# Patient Record
Sex: Male | Born: 1945 | Race: White | Hispanic: No | Marital: Married | Smoking: Former smoker
Health system: Southern US, Community
[De-identification: ages and names within clinical notes are randomized; demographics above are authoritative.]

## PROBLEM LIST (undated history)

## (undated) DIAGNOSIS — Z87442 Personal history of urinary calculi: Secondary | ICD-10-CM

## (undated) DIAGNOSIS — K219 Gastro-esophageal reflux disease without esophagitis: Secondary | ICD-10-CM

## (undated) DIAGNOSIS — B999 Unspecified infectious disease: Secondary | ICD-10-CM

## (undated) DIAGNOSIS — I10 Essential (primary) hypertension: Secondary | ICD-10-CM

## (undated) DIAGNOSIS — Z8719 Personal history of other diseases of the digestive system: Secondary | ICD-10-CM

## (undated) DIAGNOSIS — E039 Hypothyroidism, unspecified: Secondary | ICD-10-CM

## (undated) DIAGNOSIS — T7840XA Allergy, unspecified, initial encounter: Secondary | ICD-10-CM

## (undated) DIAGNOSIS — G5791 Unspecified mononeuropathy of right lower limb: Secondary | ICD-10-CM

## (undated) DIAGNOSIS — I714 Abdominal aortic aneurysm, without rupture, unspecified: Secondary | ICD-10-CM

## (undated) DIAGNOSIS — T8859XA Other complications of anesthesia, initial encounter: Secondary | ICD-10-CM

## (undated) DIAGNOSIS — I219 Acute myocardial infarction, unspecified: Secondary | ICD-10-CM

## (undated) DIAGNOSIS — I509 Heart failure, unspecified: Secondary | ICD-10-CM

## (undated) DIAGNOSIS — B957 Other staphylococcus as the cause of diseases classified elsewhere: Secondary | ICD-10-CM

## (undated) DIAGNOSIS — H269 Unspecified cataract: Secondary | ICD-10-CM

## (undated) DIAGNOSIS — K449 Diaphragmatic hernia without obstruction or gangrene: Secondary | ICD-10-CM

## (undated) DIAGNOSIS — K649 Unspecified hemorrhoids: Secondary | ICD-10-CM

## (undated) DIAGNOSIS — Z5189 Encounter for other specified aftercare: Secondary | ICD-10-CM

## (undated) DIAGNOSIS — N189 Chronic kidney disease, unspecified: Secondary | ICD-10-CM

## (undated) DIAGNOSIS — M869 Osteomyelitis, unspecified: Secondary | ICD-10-CM

## (undated) DIAGNOSIS — F329 Major depressive disorder, single episode, unspecified: Secondary | ICD-10-CM

## (undated) DIAGNOSIS — Z9189 Other specified personal risk factors, not elsewhere classified: Secondary | ICD-10-CM

## (undated) DIAGNOSIS — R7303 Prediabetes: Secondary | ICD-10-CM

## (undated) DIAGNOSIS — R609 Edema, unspecified: Secondary | ICD-10-CM

## (undated) DIAGNOSIS — R7302 Impaired glucose tolerance (oral): Secondary | ICD-10-CM

## (undated) DIAGNOSIS — J189 Pneumonia, unspecified organism: Secondary | ICD-10-CM

## (undated) DIAGNOSIS — E785 Hyperlipidemia, unspecified: Secondary | ICD-10-CM

## (undated) DIAGNOSIS — R42 Dizziness and giddiness: Secondary | ICD-10-CM

## (undated) DIAGNOSIS — L03031 Cellulitis of right toe: Secondary | ICD-10-CM

## (undated) DIAGNOSIS — T8459XA Infection and inflammatory reaction due to other internal joint prosthesis, initial encounter: Secondary | ICD-10-CM

## (undated) DIAGNOSIS — I959 Hypotension, unspecified: Secondary | ICD-10-CM

## (undated) DIAGNOSIS — T4145XA Adverse effect of unspecified anesthetic, initial encounter: Secondary | ICD-10-CM

## (undated) DIAGNOSIS — K5730 Diverticulosis of large intestine without perforation or abscess without bleeding: Secondary | ICD-10-CM

## (undated) DIAGNOSIS — E119 Type 2 diabetes mellitus without complications: Secondary | ICD-10-CM

## (undated) DIAGNOSIS — N529 Male erectile dysfunction, unspecified: Secondary | ICD-10-CM

## (undated) DIAGNOSIS — G608 Other hereditary and idiopathic neuropathies: Secondary | ICD-10-CM

## (undated) DIAGNOSIS — Z96659 Presence of unspecified artificial knee joint: Secondary | ICD-10-CM

## (undated) DIAGNOSIS — L309 Dermatitis, unspecified: Secondary | ICD-10-CM

## (undated) DIAGNOSIS — I2510 Atherosclerotic heart disease of native coronary artery without angina pectoris: Secondary | ICD-10-CM

## (undated) DIAGNOSIS — F411 Generalized anxiety disorder: Secondary | ICD-10-CM

## (undated) DIAGNOSIS — M1990 Unspecified osteoarthritis, unspecified site: Secondary | ICD-10-CM

## (undated) DIAGNOSIS — L304 Erythema intertrigo: Secondary | ICD-10-CM

## (undated) HISTORY — DX: Chronic kidney disease, unspecified: N18.9

## (undated) HISTORY — PX: HIP SURGERY: SHX245

## (undated) HISTORY — DX: Edema, unspecified: R60.9

## (undated) HISTORY — DX: Diverticulosis of large intestine without perforation or abscess without bleeding: K57.30

## (undated) HISTORY — DX: Encounter for other specified aftercare: Z51.89

## (undated) HISTORY — DX: Diaphragmatic hernia without obstruction or gangrene: K44.9

## (undated) HISTORY — DX: Other staphylococcus as the cause of diseases classified elsewhere: B95.7

## (undated) HISTORY — PX: LEG SURGERY: SHX1003

## (undated) HISTORY — DX: Generalized anxiety disorder: F41.1

## (undated) HISTORY — PX: UPPER GASTROINTESTINAL ENDOSCOPY: SHX188

## (undated) HISTORY — DX: Unspecified cataract: H26.9

## (undated) HISTORY — DX: Gastro-esophageal reflux disease without esophagitis: K21.9

## (undated) HISTORY — PX: NISSEN FUNDOPLICATION: SHX2091

## (undated) HISTORY — DX: Other hereditary and idiopathic neuropathies: G60.8

## (undated) HISTORY — DX: Unspecified hemorrhoids: K64.9

## (undated) HISTORY — DX: Hypothyroidism, unspecified: E03.9

## (undated) HISTORY — DX: Pneumonia, unspecified organism: J18.9

## (undated) HISTORY — DX: Unspecified mononeuropathy of right lower limb: G57.91

## (undated) HISTORY — DX: Unspecified osteoarthritis, unspecified site: M19.90

## (undated) HISTORY — DX: Presence of unspecified artificial knee joint: Z96.659

## (undated) HISTORY — DX: Personal history of other diseases of the digestive system: Z87.19

## (undated) HISTORY — DX: Acute myocardial infarction, unspecified: I21.9

## (undated) HISTORY — DX: Other specified personal risk factors, not elsewhere classified: Z91.89

## (undated) HISTORY — DX: Erythema intertrigo: L30.4

## (undated) HISTORY — DX: Cellulitis of right toe: L03.031

## (undated) HISTORY — DX: Allergy, unspecified, initial encounter: T78.40XA

## (undated) HISTORY — DX: Dizziness and giddiness: R42

## (undated) HISTORY — DX: Hypotension, unspecified: I95.9

## (undated) HISTORY — DX: Heart failure, unspecified: I50.9

## (undated) HISTORY — DX: Impaired glucose tolerance (oral): R73.02

## (undated) HISTORY — DX: Major depressive disorder, single episode, unspecified: F32.9

## (undated) HISTORY — DX: Essential (primary) hypertension: I10

## (undated) HISTORY — DX: Dermatitis, unspecified: L30.9

## (undated) HISTORY — DX: Infection and inflammatory reaction due to other internal joint prosthesis, initial encounter: T84.59XA

## (undated) HISTORY — DX: Atherosclerotic heart disease of native coronary artery without angina pectoris: I25.10

## (undated) HISTORY — PX: FOOT SURGERY: SHX648

## (undated) HISTORY — DX: Osteomyelitis, unspecified: M86.9

## (undated) HISTORY — DX: Male erectile dysfunction, unspecified: N52.9

## (undated) HISTORY — PX: OTHER SURGICAL HISTORY: SHX169

## (undated) HISTORY — PX: COLONOSCOPY: SHX174

## (undated) HISTORY — DX: Hyperlipidemia, unspecified: E78.5

---

## 1998-08-26 ENCOUNTER — Ambulatory Visit (HOSPITAL_COMMUNITY): Admission: RE | Admit: 1998-08-26 | Discharge: 1998-08-26 | Payer: Self-pay | Admitting: Gastroenterology

## 1999-09-26 ENCOUNTER — Inpatient Hospital Stay (HOSPITAL_COMMUNITY): Admission: EM | Admit: 1999-09-26 | Discharge: 1999-10-01 | Payer: Self-pay | Admitting: Emergency Medicine

## 1999-09-26 ENCOUNTER — Encounter: Payer: Self-pay | Admitting: Emergency Medicine

## 1999-09-27 ENCOUNTER — Encounter: Payer: Self-pay | Admitting: Surgery

## 1999-09-27 ENCOUNTER — Encounter: Payer: Self-pay | Admitting: General Surgery

## 1999-09-30 ENCOUNTER — Encounter: Payer: Self-pay | Admitting: General Surgery

## 2000-07-06 ENCOUNTER — Encounter: Payer: Self-pay | Admitting: Family Medicine

## 2000-07-06 ENCOUNTER — Ambulatory Visit (HOSPITAL_COMMUNITY): Admission: RE | Admit: 2000-07-06 | Discharge: 2000-07-06 | Payer: Self-pay | Admitting: Family Medicine

## 2000-10-31 ENCOUNTER — Other Ambulatory Visit: Admission: RE | Admit: 2000-10-31 | Discharge: 2000-10-31 | Payer: Self-pay | Admitting: Gastroenterology

## 2000-10-31 ENCOUNTER — Encounter (INDEPENDENT_AMBULATORY_CARE_PROVIDER_SITE_OTHER): Payer: Self-pay | Admitting: Specialist

## 2002-03-27 ENCOUNTER — Emergency Department (HOSPITAL_COMMUNITY): Admission: EM | Admit: 2002-03-27 | Discharge: 2002-03-27 | Payer: Self-pay | Admitting: Emergency Medicine

## 2002-06-19 ENCOUNTER — Encounter: Payer: Self-pay | Admitting: Orthopedic Surgery

## 2002-06-19 ENCOUNTER — Inpatient Hospital Stay (HOSPITAL_COMMUNITY): Admission: EM | Admit: 2002-06-19 | Discharge: 2002-06-26 | Payer: Self-pay | Admitting: Emergency Medicine

## 2002-06-19 ENCOUNTER — Encounter: Payer: Self-pay | Admitting: Emergency Medicine

## 2002-06-24 ENCOUNTER — Encounter: Payer: Self-pay | Admitting: Orthopedic Surgery

## 2004-08-31 ENCOUNTER — Ambulatory Visit: Payer: Self-pay | Admitting: Internal Medicine

## 2004-09-16 ENCOUNTER — Ambulatory Visit: Payer: Self-pay

## 2005-03-28 DIAGNOSIS — I219 Acute myocardial infarction, unspecified: Secondary | ICD-10-CM

## 2005-03-28 HISTORY — PX: JOINT REPLACEMENT: SHX530

## 2005-03-28 HISTORY — DX: Acute myocardial infarction, unspecified: I21.9

## 2005-04-27 ENCOUNTER — Ambulatory Visit: Payer: Self-pay | Admitting: Internal Medicine

## 2005-10-03 ENCOUNTER — Emergency Department (HOSPITAL_COMMUNITY): Admission: EM | Admit: 2005-10-03 | Discharge: 2005-10-03 | Payer: Self-pay | Admitting: Emergency Medicine

## 2005-10-05 ENCOUNTER — Observation Stay (HOSPITAL_COMMUNITY): Admission: RE | Admit: 2005-10-05 | Discharge: 2005-10-06 | Payer: Self-pay | Admitting: Orthopedic Surgery

## 2005-10-20 ENCOUNTER — Ambulatory Visit: Payer: Self-pay | Admitting: Internal Medicine

## 2005-10-20 LAB — CONVERTED CEMR LAB: PSA: 0.33 ng/mL

## 2005-10-25 ENCOUNTER — Ambulatory Visit (HOSPITAL_COMMUNITY): Admission: RE | Admit: 2005-10-25 | Discharge: 2005-10-25 | Payer: Self-pay | Admitting: Internal Medicine

## 2005-11-11 ENCOUNTER — Ambulatory Visit: Payer: Self-pay | Admitting: Gastroenterology

## 2005-12-19 ENCOUNTER — Ambulatory Visit: Payer: Self-pay | Admitting: Gastroenterology

## 2005-12-19 ENCOUNTER — Encounter: Payer: Self-pay | Admitting: Gastroenterology

## 2005-12-19 LAB — HM COLONOSCOPY

## 2006-01-23 ENCOUNTER — Ambulatory Visit: Payer: Self-pay | Admitting: Internal Medicine

## 2006-02-25 HISTORY — PX: CORONARY ARTERY BYPASS GRAFT: SHX141

## 2006-03-15 ENCOUNTER — Ambulatory Visit: Payer: Self-pay | Admitting: Cardiology

## 2006-03-15 ENCOUNTER — Inpatient Hospital Stay (HOSPITAL_COMMUNITY)
Admission: RE | Admit: 2006-03-15 | Discharge: 2006-03-31 | Payer: Self-pay | Admitting: Thoracic Surgery (Cardiothoracic Vascular Surgery)

## 2006-03-20 ENCOUNTER — Ambulatory Visit: Payer: Self-pay | Admitting: Physical Medicine & Rehabilitation

## 2006-03-31 ENCOUNTER — Inpatient Hospital Stay (HOSPITAL_COMMUNITY)
Admission: RE | Admit: 2006-03-31 | Discharge: 2006-04-12 | Payer: Self-pay | Admitting: Physical Medicine & Rehabilitation

## 2006-04-07 ENCOUNTER — Encounter: Payer: Self-pay | Admitting: Vascular Surgery

## 2006-05-04 ENCOUNTER — Ambulatory Visit: Payer: Self-pay | Admitting: Cardiothoracic Surgery

## 2006-05-05 ENCOUNTER — Ambulatory Visit: Payer: Self-pay | Admitting: Cardiology

## 2006-05-05 ENCOUNTER — Ambulatory Visit: Payer: Self-pay | Admitting: Internal Medicine

## 2006-05-11 ENCOUNTER — Ambulatory Visit: Payer: Self-pay

## 2006-05-11 ENCOUNTER — Encounter: Payer: Self-pay | Admitting: Cardiology

## 2006-05-24 ENCOUNTER — Encounter: Admission: RE | Admit: 2006-05-24 | Discharge: 2006-06-28 | Payer: Self-pay | Admitting: Orthopedic Surgery

## 2006-05-26 ENCOUNTER — Ambulatory Visit: Payer: Self-pay | Admitting: Cardiology

## 2006-06-02 ENCOUNTER — Ambulatory Visit: Payer: Self-pay | Admitting: Internal Medicine

## 2006-06-02 LAB — CONVERTED CEMR LAB
Albumin: 3.8 g/dL (ref 3.5–5.2)
BUN: 11 mg/dL (ref 6–23)
Calcium: 9.6 mg/dL (ref 8.4–10.5)
Chloride: 100 meq/L (ref 96–112)
Cholesterol: 124 mg/dL (ref 0–200)
GFR calc Af Amer: 87 mL/min
GFR calc non Af Amer: 72 mL/min
Glucose, Bld: 103 mg/dL — ABNORMAL HIGH (ref 70–99)
HDL: 28.1 mg/dL — ABNORMAL LOW (ref >39.0)
Sodium: 136 meq/L (ref 135–145)
Total CHOL/HDL Ratio: 4.4
VLDL: 26 mg/dL (ref 0–40)

## 2006-06-06 ENCOUNTER — Ambulatory Visit: Payer: Self-pay | Admitting: Thoracic Surgery (Cardiothoracic Vascular Surgery)

## 2006-06-29 ENCOUNTER — Encounter: Admission: RE | Admit: 2006-06-29 | Discharge: 2006-07-24 | Payer: Self-pay | Admitting: Orthopedic Surgery

## 2006-08-31 ENCOUNTER — Ambulatory Visit: Payer: Self-pay | Admitting: Cardiology

## 2006-11-09 ENCOUNTER — Encounter: Payer: Self-pay | Admitting: Internal Medicine

## 2006-11-09 DIAGNOSIS — K219 Gastro-esophageal reflux disease without esophagitis: Secondary | ICD-10-CM

## 2006-11-09 DIAGNOSIS — Z9189 Other specified personal risk factors, not elsewhere classified: Secondary | ICD-10-CM | POA: Insufficient documentation

## 2006-11-09 DIAGNOSIS — I10 Essential (primary) hypertension: Secondary | ICD-10-CM | POA: Insufficient documentation

## 2006-11-09 DIAGNOSIS — E785 Hyperlipidemia, unspecified: Secondary | ICD-10-CM | POA: Insufficient documentation

## 2006-11-09 DIAGNOSIS — K5730 Diverticulosis of large intestine without perforation or abscess without bleeding: Secondary | ICD-10-CM | POA: Insufficient documentation

## 2006-11-09 DIAGNOSIS — Z8719 Personal history of other diseases of the digestive system: Secondary | ICD-10-CM

## 2006-11-09 DIAGNOSIS — I2510 Atherosclerotic heart disease of native coronary artery without angina pectoris: Secondary | ICD-10-CM

## 2006-11-09 DIAGNOSIS — K449 Diaphragmatic hernia without obstruction or gangrene: Secondary | ICD-10-CM | POA: Insufficient documentation

## 2006-11-09 DIAGNOSIS — J309 Allergic rhinitis, unspecified: Secondary | ICD-10-CM | POA: Insufficient documentation

## 2006-11-09 HISTORY — DX: Personal history of other diseases of the digestive system: Z87.19

## 2006-11-09 HISTORY — DX: Gastro-esophageal reflux disease without esophagitis: K21.9

## 2006-11-09 HISTORY — DX: Diaphragmatic hernia without obstruction or gangrene: K44.9

## 2006-11-09 HISTORY — DX: Essential (primary) hypertension: I10

## 2006-11-09 HISTORY — DX: Atherosclerotic heart disease of native coronary artery without angina pectoris: I25.10

## 2006-11-09 HISTORY — DX: Diverticulosis of large intestine without perforation or abscess without bleeding: K57.30

## 2006-11-09 HISTORY — DX: Hyperlipidemia, unspecified: E78.5

## 2006-11-09 HISTORY — DX: Other specified personal risk factors, not elsewhere classified: Z91.89

## 2006-11-11 DIAGNOSIS — G629 Polyneuropathy, unspecified: Secondary | ICD-10-CM | POA: Insufficient documentation

## 2006-11-11 DIAGNOSIS — I5032 Chronic diastolic (congestive) heart failure: Secondary | ICD-10-CM | POA: Insufficient documentation

## 2006-11-11 DIAGNOSIS — F411 Generalized anxiety disorder: Secondary | ICD-10-CM

## 2006-11-11 DIAGNOSIS — I509 Heart failure, unspecified: Secondary | ICD-10-CM

## 2006-11-11 DIAGNOSIS — G608 Other hereditary and idiopathic neuropathies: Secondary | ICD-10-CM

## 2006-11-11 DIAGNOSIS — Z8669 Personal history of other diseases of the nervous system and sense organs: Secondary | ICD-10-CM | POA: Insufficient documentation

## 2006-11-11 DIAGNOSIS — I5030 Unspecified diastolic (congestive) heart failure: Secondary | ICD-10-CM | POA: Insufficient documentation

## 2006-11-11 HISTORY — DX: Other hereditary and idiopathic neuropathies: G60.8

## 2006-11-11 HISTORY — DX: Generalized anxiety disorder: F41.1

## 2006-11-11 HISTORY — DX: Heart failure, unspecified: I50.9

## 2006-12-12 ENCOUNTER — Ambulatory Visit: Payer: Self-pay | Admitting: Internal Medicine

## 2006-12-12 LAB — CONVERTED CEMR LAB
ALT: 24 units/L (ref 0–53)
AST: 23 units/L (ref 0–37)
Albumin: 3.9 g/dL (ref 3.5–5.2)
Alkaline Phosphatase: 116 units/L (ref 39–117)
BUN: 15 mg/dL (ref 6–23)
Basophils Absolute: 0 10*3/uL (ref 0.0–0.1)
Basophils Relative: 0.2 % (ref 0.0–1.0)
Calcium: 9.2 mg/dL (ref 8.4–10.5)
Creatinine,U: 38.9 mg/dL
Eosinophils Absolute: 0.4 10*3/uL (ref 0.0–0.6)
Eosinophils Relative: 5.6 % — ABNORMAL HIGH (ref 0.0–5.0)
HCT: 44.9 % (ref 39.0–52.0)
Hemoglobin: 15.5 g/dL (ref 13.0–17.0)
Hgb A1c MFr Bld: 5.6 % (ref 4.6–6.0)
Microalb Creat Ratio: 7.7 mg/g (ref 0.0–30.0)
Monocytes Absolute: 0.6 10*3/uL (ref 0.2–0.7)
Monocytes Relative: 7.8 % (ref 3.0–11.0)
Platelets: 190 10*3/uL (ref 150–400)
RBC: 4.79 M/uL (ref 4.22–5.81)
Sodium: 141 meq/L (ref 135–145)
TSH: 6.15 microintl units/mL — ABNORMAL HIGH (ref 0.35–5.50)
Total Protein: 7.3 g/dL (ref 6.0–8.3)
WBC: 7.9 10*3/uL (ref 4.5–10.5)

## 2006-12-15 ENCOUNTER — Ambulatory Visit: Payer: Self-pay

## 2007-01-05 ENCOUNTER — Ambulatory Visit: Payer: Self-pay | Admitting: Internal Medicine

## 2007-01-07 ENCOUNTER — Encounter: Payer: Self-pay | Admitting: Internal Medicine

## 2007-01-07 DIAGNOSIS — I714 Abdominal aortic aneurysm, without rupture, unspecified: Secondary | ICD-10-CM | POA: Insufficient documentation

## 2007-01-07 DIAGNOSIS — J069 Acute upper respiratory infection, unspecified: Secondary | ICD-10-CM | POA: Insufficient documentation

## 2007-03-01 ENCOUNTER — Ambulatory Visit: Payer: Self-pay | Admitting: Cardiology

## 2007-03-01 LAB — CONVERTED CEMR LAB
Albumin: 3.8 g/dL (ref 3.5–5.2)
Alkaline Phosphatase: 97 units/L (ref 39–117)
Bilirubin, Direct: 0.2 mg/dL (ref 0.0–0.3)
CO2: 31 meq/L (ref 19–32)
Calcium: 9.5 mg/dL (ref 8.4–10.5)
Direct LDL: 80.7 mg/dL
GFR calc Af Amer: 79 mL/min
GFR calc non Af Amer: 65 mL/min
Glucose, Bld: 99 mg/dL (ref 70–99)
HDL: 28.9 mg/dL — ABNORMAL LOW (ref >39.0)
Potassium: 4.3 meq/L (ref 3.5–5.1)
Sodium: 139 meq/L (ref 135–145)

## 2007-03-09 ENCOUNTER — Ambulatory Visit: Payer: Self-pay | Admitting: Internal Medicine

## 2007-03-15 ENCOUNTER — Ambulatory Visit: Payer: Self-pay | Admitting: Cardiology

## 2007-05-03 ENCOUNTER — Ambulatory Visit: Payer: Self-pay | Admitting: Cardiology

## 2007-05-03 LAB — CONVERTED CEMR LAB
ALT: 27 units/L (ref 0–53)
Bilirubin, Direct: 0.2 mg/dL (ref 0.0–0.3)
Cholesterol: 149 mg/dL (ref 0–200)
Direct LDL: 85.5 mg/dL
VLDL: 43 mg/dL — ABNORMAL HIGH (ref 0–40)

## 2007-05-10 ENCOUNTER — Ambulatory Visit: Payer: Self-pay | Admitting: Cardiology

## 2007-06-12 ENCOUNTER — Ambulatory Visit: Payer: Self-pay | Admitting: Cardiology

## 2007-06-12 LAB — CONVERTED CEMR LAB
Calcium: 9.3 mg/dL (ref 8.4–10.5)
Creatinine, Ser: 1.3 mg/dL (ref 0.4–1.5)
GFR calc Af Amer: 72 mL/min
GFR calc non Af Amer: 59 mL/min
Glucose, Bld: 96 mg/dL (ref 70–99)
Potassium: 4.2 meq/L (ref 3.5–5.1)
Sodium: 141 meq/L (ref 135–145)

## 2007-06-26 ENCOUNTER — Ambulatory Visit: Payer: Self-pay | Admitting: Cardiology

## 2007-06-26 LAB — CONVERTED CEMR LAB
BUN: 13 mg/dL (ref 6–23)
Calcium: 9.2 mg/dL (ref 8.4–10.5)
Chloride: 105 meq/L (ref 96–112)
Creatinine, Ser: 1.2 mg/dL (ref 0.4–1.5)
GFR calc non Af Amer: 65 mL/min
Potassium: 4.1 meq/L (ref 3.5–5.1)

## 2007-07-05 ENCOUNTER — Ambulatory Visit: Payer: Self-pay | Admitting: Internal Medicine

## 2007-07-05 LAB — CONVERTED CEMR LAB
ALT: 26 units/L (ref 0–53)
Albumin: 3.8 g/dL (ref 3.5–5.2)
Alkaline Phosphatase: 86 units/L (ref 39–117)
Cholesterol: 131 mg/dL (ref 0–200)
Direct LDL: 73.1 mg/dL
Total Bilirubin: 0.9 mg/dL (ref 0.3–1.2)

## 2007-07-26 ENCOUNTER — Ambulatory Visit: Payer: Self-pay | Admitting: Internal Medicine

## 2007-07-26 DIAGNOSIS — M25579 Pain in unspecified ankle and joints of unspecified foot: Secondary | ICD-10-CM | POA: Insufficient documentation

## 2007-09-03 ENCOUNTER — Ambulatory Visit: Payer: Self-pay | Admitting: Cardiology

## 2007-09-03 LAB — CONVERTED CEMR LAB
ALT: 21 units/L (ref 0–53)
AST: 22 units/L (ref 0–37)
Bilirubin, Direct: 0.1 mg/dL (ref 0.0–0.3)
Cholesterol: 134 mg/dL (ref 0–200)
Total Bilirubin: 1.1 mg/dL (ref 0.3–1.2)
Total CHOL/HDL Ratio: 3.8
Total Protein: 7.2 g/dL (ref 6.0–8.3)
Triglycerides: 149 mg/dL (ref 0–149)

## 2007-09-06 ENCOUNTER — Ambulatory Visit: Payer: Self-pay | Admitting: Cardiology

## 2008-01-16 ENCOUNTER — Ambulatory Visit: Payer: Self-pay | Admitting: Internal Medicine

## 2008-01-16 ENCOUNTER — Ambulatory Visit: Payer: Self-pay | Admitting: Gastroenterology

## 2008-01-16 LAB — CONVERTED CEMR LAB
Albumin: 3.9 g/dL (ref 3.5–5.2)
BUN: 19 mg/dL (ref 6–23)
Bilirubin Urine: NEGATIVE
Bilirubin, Direct: 0.3 mg/dL (ref 0.0–0.3)
CO2: 27 meq/L (ref 19–32)
Chloride: 105 meq/L (ref 96–112)
Eosinophils Relative: 2.2 % (ref 0.0–5.0)
Glucose, Bld: 100 mg/dL — ABNORMAL HIGH (ref 70–99)
HCT: 43.4 % (ref 39.0–52.0)
Ketones, ur: NEGATIVE mg/dL
Leukocytes, UA: NEGATIVE
MCHC: 35 g/dL (ref 30.0–36.0)
MCV: 95.7 fL (ref 78.0–100.0)
Monocytes Absolute: 0.4 10*3/uL (ref 0.1–1.0)
Nitrite: NEGATIVE
PSA: 0.34 ng/mL (ref 0.10–4.00)
RBC: 4.53 M/uL (ref 4.22–5.81)
Sodium: 141 meq/L (ref 135–145)
TSH: 3.41 microintl units/mL (ref 0.35–5.50)
Total Protein: 6.9 g/dL (ref 6.0–8.3)
Triglycerides: 130 mg/dL (ref 0–149)
Urine Glucose: NEGATIVE mg/dL
Urobilinogen, UA: 0.2 (ref 0.0–1.0)
VLDL: 26 mg/dL (ref 0–40)
WBC: 8.2 10*3/uL (ref 4.5–10.5)
pH: 6 (ref 5.0–8.0)

## 2008-01-25 ENCOUNTER — Ambulatory Visit: Payer: Self-pay | Admitting: Internal Medicine

## 2008-01-25 DIAGNOSIS — R0602 Shortness of breath: Secondary | ICD-10-CM | POA: Insufficient documentation

## 2008-01-29 ENCOUNTER — Telehealth (INDEPENDENT_AMBULATORY_CARE_PROVIDER_SITE_OTHER): Payer: Self-pay | Admitting: *Deleted

## 2008-01-30 ENCOUNTER — Ambulatory Visit: Payer: Self-pay | Admitting: Gastroenterology

## 2008-01-30 ENCOUNTER — Encounter: Payer: Self-pay | Admitting: Gastroenterology

## 2008-01-30 DIAGNOSIS — K2970 Gastritis, unspecified, without bleeding: Secondary | ICD-10-CM | POA: Insufficient documentation

## 2008-01-30 DIAGNOSIS — K2990 Gastroduodenitis, unspecified, without bleeding: Secondary | ICD-10-CM

## 2008-01-30 LAB — CONVERTED CEMR LAB: UREASE: NEGATIVE

## 2008-02-01 ENCOUNTER — Encounter: Payer: Self-pay | Admitting: Gastroenterology

## 2008-03-03 ENCOUNTER — Ambulatory Visit: Payer: Self-pay | Admitting: Internal Medicine

## 2008-03-20 ENCOUNTER — Telehealth (INDEPENDENT_AMBULATORY_CARE_PROVIDER_SITE_OTHER): Payer: Self-pay | Admitting: *Deleted

## 2008-06-18 ENCOUNTER — Ambulatory Visit: Payer: Self-pay | Admitting: Cardiology

## 2008-06-18 LAB — CONVERTED CEMR LAB
Albumin: 3.6 g/dL (ref 3.5–5.2)
Bilirubin, Direct: 0.2 mg/dL (ref 0.0–0.3)
Cholesterol: 118 mg/dL (ref 0–200)
HDL: 28.3 mg/dL — ABNORMAL LOW (ref >39.00)
LDL Cholesterol: 64 mg/dL (ref 0–99)
Total Bilirubin: 1.3 mg/dL — ABNORMAL HIGH (ref 0.3–1.2)
Total Protein: 6.9 g/dL (ref 6.0–8.3)
VLDL: 25.4 mg/dL (ref 0.0–40.0)

## 2008-07-18 ENCOUNTER — Ambulatory Visit: Payer: Self-pay | Admitting: Internal Medicine

## 2008-07-18 DIAGNOSIS — R350 Frequency of micturition: Secondary | ICD-10-CM | POA: Insufficient documentation

## 2008-07-18 DIAGNOSIS — M79609 Pain in unspecified limb: Secondary | ICD-10-CM | POA: Insufficient documentation

## 2008-07-19 LAB — CONVERTED CEMR LAB
Nitrite: NEGATIVE
Total Protein, Urine: NEGATIVE mg/dL
Urine Glucose: NEGATIVE mg/dL
pH: 5.5 (ref 5.0–8.0)

## 2008-07-23 ENCOUNTER — Ambulatory Visit: Payer: Self-pay

## 2008-07-23 ENCOUNTER — Encounter: Payer: Self-pay | Admitting: Internal Medicine

## 2008-08-27 DIAGNOSIS — I219 Acute myocardial infarction, unspecified: Secondary | ICD-10-CM | POA: Insufficient documentation

## 2008-08-27 DIAGNOSIS — I719 Aortic aneurysm of unspecified site, without rupture: Secondary | ICD-10-CM | POA: Insufficient documentation

## 2008-08-27 DIAGNOSIS — M1990 Unspecified osteoarthritis, unspecified site: Secondary | ICD-10-CM

## 2008-08-27 HISTORY — DX: Unspecified osteoarthritis, unspecified site: M19.90

## 2008-08-28 ENCOUNTER — Ambulatory Visit: Payer: Self-pay | Admitting: Cardiology

## 2008-09-04 ENCOUNTER — Telehealth: Payer: Self-pay | Admitting: Internal Medicine

## 2008-11-13 ENCOUNTER — Ambulatory Visit: Payer: Self-pay | Admitting: Cardiology

## 2008-11-13 ENCOUNTER — Ambulatory Visit: Payer: Self-pay | Admitting: Internal Medicine

## 2008-11-13 DIAGNOSIS — R109 Unspecified abdominal pain: Secondary | ICD-10-CM | POA: Insufficient documentation

## 2008-11-13 LAB — CONVERTED CEMR LAB
ALT: 22 units/L (ref 0–53)
AST: 22 units/L (ref 0–37)
BUN: 20 mg/dL (ref 6–23)
Basophils Relative: 0.8 % (ref 0.0–3.0)
Bilirubin, Direct: 0.1 mg/dL (ref 0.0–0.3)
Chloride: 100 meq/L (ref 96–112)
Eosinophils Relative: 3.5 % (ref 0.0–5.0)
GFR calc non Af Amer: 58.84 mL/min (ref >60)
HCT: 41.8 % (ref 39.0–52.0)
Ketones, ur: NEGATIVE mg/dL
Leukocytes, UA: NEGATIVE
Lipase: 2 units/L — ABNORMAL LOW (ref 11.0–59.0)
Lymphs Abs: 2 10*3/uL (ref 0.7–4.0)
Monocytes Relative: 9.4 % (ref 3.0–12.0)
Nitrite: NEGATIVE
Platelets: 124 10*3/uL — ABNORMAL LOW (ref 150.0–400.0)
Potassium: 4.3 meq/L (ref 3.5–5.1)
RBC: 4.35 M/uL (ref 4.22–5.81)
Sodium: 138 meq/L (ref 135–145)
Specific Gravity, Urine: <=1.005 (ref 1.000–1.030)
Total Bilirubin: 1.1 mg/dL (ref 0.3–1.2)
Total Protein: 7.5 g/dL (ref 6.0–8.3)
Urobilinogen, UA: 0.2 (ref 0.0–1.0)
WBC: 7.7 10*3/uL (ref 4.5–10.5)
pH: 6 (ref 5.0–8.0)

## 2008-11-14 ENCOUNTER — Encounter: Payer: Self-pay | Admitting: Internal Medicine

## 2008-12-08 ENCOUNTER — Ambulatory Visit: Payer: Self-pay | Admitting: Internal Medicine

## 2008-12-08 ENCOUNTER — Encounter: Payer: Self-pay | Admitting: Internal Medicine

## 2008-12-08 LAB — CONVERTED CEMR LAB
Bilirubin Urine: NEGATIVE
Blood in Urine, dipstick: NEGATIVE
Protein, U semiquant: NEGATIVE

## 2009-01-16 ENCOUNTER — Telehealth: Payer: Self-pay | Admitting: Cardiology

## 2009-01-20 ENCOUNTER — Ambulatory Visit: Payer: Self-pay | Admitting: Internal Medicine

## 2009-01-20 LAB — CONVERTED CEMR LAB
BUN: 18 mg/dL (ref 6–23)
Basophils Relative: 0.7 % (ref 0.0–3.0)
Bilirubin, Direct: 0.1 mg/dL (ref 0.0–0.3)
CO2: 31 meq/L (ref 19–32)
Chloride: 101 meq/L (ref 96–112)
Creatinine, Ser: 1.3 mg/dL (ref 0.4–1.5)
Eosinophils Absolute: 0.9 10*3/uL — ABNORMAL HIGH (ref 0.0–0.7)
HDL: 35 mg/dL — ABNORMAL LOW (ref >39.00)
Hemoglobin, Urine: NEGATIVE
MCHC: 35.2 g/dL (ref 30.0–36.0)
MCV: 96.6 fL (ref 78.0–100.0)
Monocytes Absolute: 0.6 10*3/uL (ref 0.1–1.0)
Neutrophils Relative %: 57.6 % (ref 43.0–77.0)
Platelets: 145 10*3/uL — ABNORMAL LOW (ref 150.0–400.0)
RDW: 12.2 % (ref 11.5–14.6)
Total Bilirubin: 1.1 mg/dL (ref 0.3–1.2)
Total CHOL/HDL Ratio: 4
Total Protein, Urine: NEGATIVE mg/dL
Total Protein: 7.2 g/dL (ref 6.0–8.3)
Triglycerides: 167 mg/dL — ABNORMAL HIGH (ref 0.0–149.0)
Urine Glucose: NEGATIVE mg/dL

## 2009-02-24 ENCOUNTER — Telehealth: Payer: Self-pay | Admitting: Internal Medicine

## 2009-03-16 ENCOUNTER — Telehealth: Payer: Self-pay | Admitting: Cardiology

## 2009-07-01 ENCOUNTER — Ambulatory Visit: Payer: Self-pay | Admitting: Internal Medicine

## 2009-09-02 ENCOUNTER — Encounter (INDEPENDENT_AMBULATORY_CARE_PROVIDER_SITE_OTHER): Payer: Self-pay | Admitting: *Deleted

## 2009-10-20 ENCOUNTER — Ambulatory Visit: Payer: Self-pay | Admitting: Cardiology

## 2009-12-11 ENCOUNTER — Telehealth: Payer: Self-pay | Admitting: Internal Medicine

## 2009-12-11 ENCOUNTER — Telehealth: Payer: Self-pay | Admitting: Cardiology

## 2009-12-30 ENCOUNTER — Ambulatory Visit: Payer: Self-pay | Admitting: Internal Medicine

## 2009-12-30 LAB — CONVERTED CEMR LAB
ALT: 16 units/L (ref 0–53)
AST: 21 units/L (ref 0–37)
Alkaline Phosphatase: 80 units/L (ref 39–117)
Basophils Absolute: 0 10*3/uL (ref 0.0–0.1)
Bilirubin, Direct: 0.1 mg/dL (ref 0.0–0.3)
CO2: 28 meq/L (ref 19–32)
Chloride: 105 meq/L (ref 96–112)
Cholesterol: 113 mg/dL (ref 0–200)
Eosinophils Absolute: 0.2 10*3/uL (ref 0.0–0.7)
Hemoglobin, Urine: NEGATIVE
Hemoglobin: 13.4 g/dL (ref 13.0–17.0)
LDL Cholesterol: 57 mg/dL (ref 0–99)
Leukocytes, UA: NEGATIVE
Lymphocytes Relative: 23.4 % (ref 12.0–46.0)
MCHC: 34.8 g/dL (ref 30.0–36.0)
Monocytes Relative: 6.8 % (ref 3.0–12.0)
Neutrophils Relative %: 66 % (ref 43.0–77.0)
PSA: 0.4 ng/mL (ref 0.10–4.00)
Potassium: 4.1 meq/L (ref 3.5–5.1)
RBC: 4 M/uL — ABNORMAL LOW (ref 4.22–5.81)
RDW: 13 % (ref 11.5–14.6)
Sodium: 140 meq/L (ref 135–145)
Total Bilirubin: 0.7 mg/dL (ref 0.3–1.2)
Total CHOL/HDL Ratio: 4
Urine Glucose: NEGATIVE mg/dL
Urobilinogen, UA: 0.2 (ref 0.0–1.0)
VLDL: 25.2 mg/dL (ref 0.0–40.0)
WBC: 6.4 10*3/uL (ref 4.5–10.5)

## 2010-01-04 ENCOUNTER — Ambulatory Visit: Payer: Self-pay | Admitting: Internal Medicine

## 2010-01-04 DIAGNOSIS — R21 Rash and other nonspecific skin eruption: Secondary | ICD-10-CM | POA: Insufficient documentation

## 2010-01-05 ENCOUNTER — Telehealth: Payer: Self-pay | Admitting: Cardiology

## 2010-01-06 ENCOUNTER — Telehealth: Payer: Self-pay | Admitting: Internal Medicine

## 2010-01-14 ENCOUNTER — Telehealth: Payer: Self-pay | Admitting: Internal Medicine

## 2010-02-07 ENCOUNTER — Ambulatory Visit: Payer: Self-pay | Admitting: Interventional Cardiology

## 2010-02-07 ENCOUNTER — Observation Stay (HOSPITAL_COMMUNITY): Admission: EM | Admit: 2010-02-07 | Discharge: 2010-02-08 | Payer: Self-pay | Admitting: Emergency Medicine

## 2010-02-09 ENCOUNTER — Telehealth (INDEPENDENT_AMBULATORY_CARE_PROVIDER_SITE_OTHER): Payer: Self-pay | Admitting: *Deleted

## 2010-02-10 ENCOUNTER — Encounter (HOSPITAL_COMMUNITY)
Admission: RE | Admit: 2010-02-10 | Discharge: 2010-03-27 | Payer: Self-pay | Source: Home / Self Care | Attending: Cardiology | Admitting: Cardiology

## 2010-02-10 ENCOUNTER — Encounter: Payer: Self-pay | Admitting: Cardiovascular Disease

## 2010-02-10 ENCOUNTER — Ambulatory Visit: Payer: Self-pay

## 2010-02-10 ENCOUNTER — Ambulatory Visit: Payer: Self-pay | Admitting: Cardiovascular Disease

## 2010-02-23 ENCOUNTER — Ambulatory Visit: Payer: Self-pay | Admitting: Internal Medicine

## 2010-02-23 DIAGNOSIS — J019 Acute sinusitis, unspecified: Secondary | ICD-10-CM | POA: Insufficient documentation

## 2010-03-11 ENCOUNTER — Ambulatory Visit: Payer: Self-pay | Admitting: Cardiology

## 2010-04-25 LAB — CONVERTED CEMR LAB
Calcium: 9.5 mg/dL (ref 8.4–10.5)
GFR calc non Af Amer: 58.88 mL/min (ref >60)
Potassium: 4.9 meq/L (ref 3.5–5.1)
Sodium: 140 meq/L (ref 135–145)
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
## 2010-04-29 NOTE — Progress Notes (Signed)
Summary: REQ FOR RX  Phone Note From Other Clinic   Summary of Call: [facility] is req rx for crestor - 90 day supply w/3 refills to be faxed to patient assistance program. Fax with cover # [phone] Initial call taken by: Lamar Sprinkles, CMA,  January 06, 2010 9:59 AM  Follow-up for Phone Call        ok - to robin to handle Follow-up by: Corwin Levins MD,  January 06, 2010 10:10 AM  Additional Follow-up for Phone Call Additional follow up Details #1::        faxed hardcopy to patient assistance. Additional Follow-up by: Robin Ewing CMA (AAMA),  January 06, 2010 10:50 AM    Prescriptions: CRESTOR 20 MG  TABS (ROSUVASTATIN CALCIUM) 1 tab by mouth once daily  #90 x 3   Entered by:   Scharlene Gloss CMA (AAMA)   Authorized by:   Corwin Levins MD   Signed by:   Scharlene Gloss CMA (AAMA) on 01/06/2010   Method used:   Printed then faxed to ...       [facility]  [address] (retail)       [address]       [place], [place]  [address]       Ph: [phone]       Fax: [phone]   RxID:   [id number]

## 2010-04-29 NOTE — Progress Notes (Signed)
Summary: RX SAMPLES   Phone Note Call from Patient   Reason for Call: Talk to Nurse Summary of Call: PT WANTS TO KNOW IF WE HAVE ANY SAMPLE CRESTOR AND ZETIA.   Initial call taken by: Roe Coombs,  January 05, 2010 9:52 AM  Follow-up for Phone Call        spoke with pt, aware samples at the front desk for pick up Deliah Goody, RN  January 05, 2010 1:19 PM
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
## 2010-04-29 NOTE — Progress Notes (Signed)
Summary: pt needs samples   Phone Note Refill Request Call back at [phone] Message from:  Patient  pt needs samples if we have them of zetia,crestor 20, lisinopril it would really help him  Initial call taken by: Omer Jack,  December 11, 2009 3:07 PM  Follow-up for Phone Call        Do have Zetia and Crestor samples.  Called pt to notify he could pick these up at front desk. Set aside 14 tablets of each zetia 10  and crestor 20 mg.  Kendal Hymen will pick these up. Follow-up by: Judithe Modest CMA,  December 11, 2009 3:37 PM
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
## 2010-04-29 NOTE — Assessment & Plan Note (Signed)
Summary: Cardiology Nuclear Testing  Nuclear Med Background Indications for Stress Test: Evaluation for Ischemia, Graft Patency, Post Hospital  Indications Comments: 02/08/10 Post Hospital for CP with neg enzymes  History: CABG, Heart Catheterization, Myocardial Infarction, Myocardial Perfusion Study  History Comments: 06 MPS no ischemia,EF=nl and infer thinning small AAA  Symptoms: Chest Pain, Chest Pressure, Dizziness, DOE, Fatigue, Light-Headedness, Palpitations, SOB    Nuclear Pre-Procedure Cardiac Risk Factors: CVA, Family History - CAD, History of Smoking, Hypertension, Lipids Caffeine/Decaff Intake: none NPO After: 7:30 AM Lungs: clear IV 0.9% NS with Angio Cath: 22g     IV Site: R Hand IV Started by: Doyne Keel, CNMT Chest Size (in) 46     Height (in): 70 Weight (lb): 262 BMI: 37.73 Tech Comments: held carvedilol and furosemide this am  Nuclear Med Study 1 or 2 day study:  1 day     Stress Test Type:  Eugenie Birks Reading MD:  Charlton Haws, MD     Referring MD:  B.Crenshaw Resting Radionuclide:  Technetium 37m Tetrofosmin     Resting Radionuclide Dose:  11 mCi  Stress Radionuclide:  Technetium 40m Tetrofosmin     Stress Radionuclide Dose:  33 mCi   Stress Protocol  Max Systolic BP: 142 mm Hg Lexiscan: 0.4 mg   Stress Test Technologist:  Milana Na, EMT-P     Nuclear Technologist:  Domenic Polite, CNMT  Rest Procedure  Myocardial perfusion imaging was performed at rest 45 minutes following the intravenous administration of Technetium 22m Tetrofosmin.  Stress Procedure  The patient received IV Lexiscan 0.4 mg over 15-seconds.  Technetium 54m Tetrofosmin injected at 30-seconds.  There were no significant changes with infusion.  Quantitative spect images were obtained after a 45 minute delay.  QPS Raw Data Images:  Normal; no motion artifact; normal heart/lung ratio. Stress Images:  Decrease inferobasal counts Rest Images:  Normal homogeneous uptake in all  areas of the myocardium. Subtraction (SDS):  SDS 0 Transient Ischemic Dilatation:  .95  (Normal <1.22)  Lung/Heart Ratio:  .33  (Normal <0.45)  Quantitative Gated Spect Images QGS EDV:  131 ml QGS ESV:  54 ml QGS EF:  59 % QGS cine images:  normal  Findings Low risk nuclear study Evidence for inferior ischemia      Overall Impression  Exercise Capacity: Lexiscan with no exercise. BP Response: Normal blood pressure response. Clinical Symptoms: Heavy legs ECG Impression: No significant ST segment change suggestive of ischemia. Overall Impression: Small area of inferobasal ischemia  Appended Document: Cardiology Nuclear Testing ok; pull images; medical therapy  Appended Document: Cardiology Nuclear Testing treadmill chart requested  Appended Document: Cardiology Nuclear Testing PT AWARE./CY

## 2010-04-29 NOTE — Progress Notes (Signed)
  Phone Note Outgoing Call   Call placed by: Robin Call placed to: Patient Summary of Call: Called pt. to inform Crestor (ordered from patient assistance)  has arrived and ready for pickup at our office. Had to leave msg. for pt to call back. Initial call taken by: Robin Ewing CMA Duncan Dull),  January 14, 2010 11:43 AM  Follow-up for Phone Call        called pt. and informed of above information. Follow-up by: Zella Ball Ewing CMA Duncan Dull),  January 14, 2010 2:36 PM
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
## 2010-04-29 NOTE — Miscellaneous (Signed)
Summary: Appointment Canceled  Appointment status changed to canceled by [employer] on 02/08/2010 9:47 AM.  Cancellation Comments --------------------- crs/dx:chest pain/wt:269/ins:[facility]  Appointment Information ----------------------- Appt Type:  CARDIOLOGY NUCLEAR TESTING      Date:  Monday, February 08, 2010      Time:  12:30 PM for 15 min   Urgency:  Routine   Made By:  Pearson Grippe  To Visit:  [id number]    Reason:  crs/dx:chest pain/wt:269/ins:[facility]  Appt Comments ------------- -- 02/08/10 9:47: ([facility]EM[facility]) CANCELED -- crs/dx:chest pain/wt:269/ins:[facility] -- 02/08/10 9:06: ([facility]EM[facility]) BOOKED -- Routine CARDIOLOGY NUCLEAR TESTING at 02/08/2010 12:30 PM for 15 min crs/dx:chest pain/wt:269/ins:[facility] -- 02/08/10 9:05: (CE

## 2010-04-29 NOTE — Progress Notes (Signed)
Summary: samples  Phone Note Call from Patient Call back at Home Phone [phone]   Caller: Patient Summary of Call: Pt is requesting samples of Zetia, Crestor, and Lisinopril Initial call taken by: Brenton Grills MA,  December 11, 2009 2:26 PM  Follow-up for Phone Call        we dont have sample of lisinopril b/c is generic, but ok for 2 wks crestor and zetia if we have them (this time only)  we normally do not sample except at OV Follow-up by: Corwin Levins MD,  December 11, 2009 2:44 PM  Additional Follow-up for Phone Call Additional follow up Details #1::        Notified pt with md response. only had zetia sample will leave up front for pick-up Additional Follow-up by: Orlan Leavens RMA,  December 11, 2009 2:53 PM

## 2010-04-29 NOTE — Progress Notes (Signed)
Summary: nuc pre procedure  Phone Note Outgoing Call Call back at Home Phone [phone]   Call placed by: Cathlyn Parsons RN,  February 09, 2010 2:39 PM Call placed to: Patient Reason for Call: Confirm/change Appt Summary of Call: Reviewed information on [employer] Information Sheet (see scanned document for further details).  Spoke with patient.      Nuclear Med Background Indications for Stress Test: Evaluation for Ischemia, Graft Patency, Post Hospital  Indications Comments: 02/08/10 Post Hospital for CP with neg enzymes  History: CABG, Heart Catheterization, Myocardial Infarction, Myocardial Perfusion Study  History Comments: 06 MPS no ischemia,EF=nl and infer thinning small AAA  Symptoms: Chest Pain, Chest Pressure, Light-Headedness    Nuclear Pre-Procedure Cardiac Risk Factors: CVA, Family History - CAD, History of Smoking, Hypertension, Lipids Height (in): 69

## 2010-04-29 NOTE — Letter (Signed)
Summary: Appointment - Reschedule  [facility], Main Office  [address]   [place], [place] [address]   Phone: [phone]  Fax: [phone]     September 02, 2009 MRN: [id number]   Steven Charles [address] [place], [place]  [address]   Dear Mr. Wheller,   Due to a change in our office schedule, your appointment on 09/10/09 at 9:30 must be changed.  It is very important that we reach you to reschedule this appointment. We look forward to participating in your health care needs. Please contact us at the number listed above at your earliest convenience to reschedule this appointment.     Sincerely, [facility] Scheduling Team
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
## 2010-06-08 LAB — CARDIAC PANEL(CRET KIN+CKTOT+MB+TROPI)
CK, MB: 1 ng/mL (ref 0.3–4.0)
Relative Index: INVALID (ref 0.0–2.5)
Total CK: 103 U/L (ref 7–232)
Troponin I: 0.01 ng/mL (ref 0.00–0.06)
Troponin I: 0.02 ng/mL (ref 0.00–0.06)

## 2010-06-08 LAB — COMPREHENSIVE METABOLIC PANEL
AST: 21 U/L (ref 0–37)
Albumin: 3.4 g/dL — ABNORMAL LOW (ref 3.5–5.2)
BUN: 15 mg/dL (ref 6–23)
CO2: 25 mEq/L (ref 19–32)
Calcium: 9 mg/dL (ref 8.4–10.5)
Creatinine, Ser: 1.13 mg/dL (ref 0.4–1.5)
GFR calc Af Amer: >60 mL/min (ref >60)
GFR calc non Af Amer: >60 mL/min (ref >60)
Total Bilirubin: 0.8 mg/dL (ref 0.3–1.2)

## 2010-06-08 LAB — URINALYSIS, MICROSCOPIC ONLY
Bilirubin Urine: NEGATIVE
Glucose, UA: NEGATIVE mg/dL
Hgb urine dipstick: NEGATIVE
Specific Gravity, Urine: 1.014 (ref 1.005–1.030)
Urobilinogen, UA: 0.2 mg/dL (ref 0.0–1.0)

## 2010-06-08 LAB — CK TOTAL AND CKMB (NOT AT ARMC)
CK, MB: 1.1 ng/mL (ref 0.3–4.0)
Relative Index: INVALID (ref 0.0–2.5)

## 2010-06-08 LAB — BASIC METABOLIC PANEL
BUN: 17 mg/dL (ref 6–23)
Calcium: 8.9 mg/dL (ref 8.4–10.5)
Calcium: 9.1 mg/dL (ref 8.4–10.5)
Creatinine, Ser: 1.15 mg/dL (ref 0.4–1.5)
GFR calc Af Amer: >60 mL/min (ref >60)
GFR calc non Af Amer: >60 mL/min (ref >60)
GFR calc non Af Amer: >60 mL/min (ref >60)
Potassium: 4.2 mEq/L (ref 3.5–5.1)
Sodium: 139 mEq/L (ref 135–145)

## 2010-06-08 LAB — DIFFERENTIAL
Basophils Relative: 0 % (ref 0–1)
Eosinophils Absolute: 0.3 10*3/uL (ref 0.0–0.7)
Eosinophils Relative: 3 % (ref 0–5)
Lymphs Abs: 2.9 10*3/uL (ref 0.7–4.0)
Neutrophils Relative %: 57 % (ref 43–77)

## 2010-06-08 LAB — CBC
HCT: 37.7 % — ABNORMAL LOW (ref 39.0–52.0)
MCH: 31.7 pg (ref 26.0–34.0)
MCHC: 34 g/dL (ref 30.0–36.0)
MCHC: 34.5 g/dL (ref 30.0–36.0)
MCV: 92.1 fL (ref 78.0–100.0)
Platelets: 129 10*3/uL — ABNORMAL LOW (ref 150–400)
Platelets: 130 10*3/uL — ABNORMAL LOW (ref 150–400)
RBC: 4.16 MIL/uL — ABNORMAL LOW (ref 4.22–5.81)
RDW: 12.2 % (ref 11.5–15.5)
WBC: 6.6 10*3/uL (ref 4.0–10.5)

## 2010-06-08 LAB — PROTIME-INR
INR: 0.94 (ref 0.00–1.49)
Prothrombin Time: 12.8 seconds (ref 11.6–15.2)

## 2010-06-08 LAB — TSH: TSH: 3.997 u[IU]/mL (ref 0.350–4.500)

## 2010-06-08 LAB — POCT CARDIAC MARKERS
CKMB, poc: <1 ng/mL — ABNORMAL LOW (ref 1.0–8.0)
Myoglobin, poc: 65.8 ng/mL (ref 12–200)

## 2010-06-08 LAB — MAGNESIUM: Magnesium: 2.1 mg/dL (ref 1.5–2.5)

## 2010-06-24 ENCOUNTER — Encounter: Payer: Self-pay | Admitting: Internal Medicine

## 2010-07-05 ENCOUNTER — Ambulatory Visit: Payer: Self-pay | Admitting: Internal Medicine

## 2010-07-08 ENCOUNTER — Encounter: Payer: Self-pay | Admitting: Internal Medicine

## 2010-07-08 ENCOUNTER — Ambulatory Visit (INDEPENDENT_AMBULATORY_CARE_PROVIDER_SITE_OTHER): Payer: Medicare Other | Admitting: Internal Medicine

## 2010-07-08 VITALS — BP 110/60 | HR 57 | Temp 98.1°F | Ht 69.0 in | Wt 278.5 lb

## 2010-07-08 DIAGNOSIS — F32A Depression, unspecified: Secondary | ICD-10-CM

## 2010-07-08 DIAGNOSIS — L309 Dermatitis, unspecified: Secondary | ICD-10-CM

## 2010-07-08 DIAGNOSIS — F3289 Other specified depressive episodes: Secondary | ICD-10-CM

## 2010-07-08 DIAGNOSIS — K1120 Sialoadenitis, unspecified: Secondary | ICD-10-CM | POA: Insufficient documentation

## 2010-07-08 DIAGNOSIS — Z0001 Encounter for general adult medical examination with abnormal findings: Secondary | ICD-10-CM | POA: Insufficient documentation

## 2010-07-08 DIAGNOSIS — M79672 Pain in left foot: Secondary | ICD-10-CM

## 2010-07-08 DIAGNOSIS — M79609 Pain in unspecified limb: Secondary | ICD-10-CM

## 2010-07-08 DIAGNOSIS — L259 Unspecified contact dermatitis, unspecified cause: Secondary | ICD-10-CM

## 2010-07-08 DIAGNOSIS — Z0000 Encounter for general adult medical examination without abnormal findings: Secondary | ICD-10-CM

## 2010-07-08 DIAGNOSIS — R42 Dizziness and giddiness: Secondary | ICD-10-CM

## 2010-07-08 DIAGNOSIS — F329 Major depressive disorder, single episode, unspecified: Secondary | ICD-10-CM

## 2010-07-08 DIAGNOSIS — J309 Allergic rhinitis, unspecified: Secondary | ICD-10-CM

## 2010-07-08 HISTORY — DX: Dermatitis, unspecified: L30.9

## 2010-07-08 HISTORY — DX: Depression, unspecified: F32.A

## 2010-07-08 MED ORDER — FLUOXETINE HCL 20 MG PO CAPS: 111.0000 mg | ORAL_CAPSULE | Freq: Every day | ORAL | Status: DC

## 2010-07-08 MED ORDER — CARVEDILOL 12.5 MG PO TABS: 12.5000 mg | ORAL_TABLET | Freq: Two times a day (BID) | ORAL | Status: DC

## 2010-07-08 MED ORDER — DICLOFENAC SODIUM 75 MG PO TBEC: 75.0000 mg | DELAYED_RELEASE_TABLET | Freq: Two times a day (BID) | ORAL | Status: DC

## 2010-07-08 MED ORDER — TRIAMCINOLONE ACETONIDE 0.1 % EX CREA: TOPICAL_CREAM | Freq: Two times a day (BID) | CUTANEOUS | Status: DC

## 2010-07-08 MED ORDER — TRAMADOL HCL 50 MG PO TABS: 50.0000 mg | ORAL_TABLET | Freq: Four times a day (QID) | ORAL | Status: DC | PRN

## 2010-07-08 MED ORDER — ROSUVASTATIN CALCIUM 20 MG PO TABS: 20.0000 mg | ORAL_TABLET | Freq: Every day | ORAL | Status: DC

## 2010-07-08 MED ORDER — EZETIMIBE 10 MG PO TABS: 10.0000 mg | ORAL_TABLET | Freq: Every day | ORAL | Status: DC

## 2010-07-08 MED ORDER — METHYLPREDNISOLONE ACETATE 80 MG/ML IJ SUSP
120.0000 mg | Freq: Once | INTRAMUSCULAR | Status: AC
  Administered 2010-07-08: 120 mg via INTRAMUSCULAR

## 2010-07-08 MED ORDER — DICLOFENAC SODIUM 1 % TD GEL: 1.0000 "application " | Freq: Four times a day (QID) | TRANSDERMAL | Status: DC

## 2010-07-08 MED ORDER — POTASSIUM CHLORIDE CRYS ER 20 MEQ PO TBCR: 20.0000 meq | EXTENDED_RELEASE_TABLET | Freq: Two times a day (BID) | ORAL | Status: DC

## 2010-07-08 MED ORDER — OMEPRAZOLE 20 MG PO CPDR: 20.0000 mg | DELAYED_RELEASE_CAPSULE | Freq: Two times a day (BID) | ORAL | Status: DC

## 2010-07-08 MED ORDER — GABAPENTIN 300 MG PO CAPS: 300.0000 mg | ORAL_CAPSULE | Freq: Two times a day (BID) | ORAL | Status: DC

## 2010-07-08 MED ORDER — TAMSULOSIN HCL 0.4 MG PO CAPS: 0.4000 mg | ORAL_CAPSULE | Freq: Every day | ORAL | Status: DC

## 2010-07-08 MED ORDER — LISINOPRIL 20 MG PO TABS: 20.0000 mg | ORAL_TABLET | Freq: Every day | ORAL | Status: DC

## 2010-07-08 MED ORDER — FUROSEMIDE 40 MG PO TABS: 20.0000 mg | ORAL_TABLET | Freq: Two times a day (BID) | ORAL | Status: DC

## 2010-07-08 MED ORDER — CALCIUM ACETATE 667 MG PO CAPS: 667.0000 mg | ORAL_CAPSULE | Freq: Every day | ORAL | Status: DC

## 2010-07-08 MED ORDER — LORAZEPAM 1 MG PO TABS: 1.0000 mg | ORAL_TABLET | Freq: Three times a day (TID) | ORAL | Status: DC | PRN

## 2010-07-08 NOTE — Assessment & Plan Note (Addendum)
Some improved in sweling and pain per pt - to f/u CT and ENT as planned, afeb and will hold antibx - ? Stone related

## 2010-07-08 NOTE — Assessment & Plan Note (Signed)
Likely related to seasonal allergy flare by exam - improved, no fiurther eval at this time

## 2010-07-08 NOTE — Assessment & Plan Note (Signed)
Mod seasonal flare for several wks - for depomedrol IM today, and allegra OTC prn

## 2010-07-08 NOTE — Assessment & Plan Note (Signed)
Mild to mod, declines counseling, nonsuicidal, to start prozac 20 mg qd

## 2010-07-08 NOTE — Assessment & Plan Note (Signed)
Chronic ongoing - for ortho referral/de benarz;  Normally sees Dr Lajoyce Corners for left knee s/p replacement
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
## 2010-07-08 NOTE — Assessment & Plan Note (Signed)
Mild, right hand  - for kenalog cr refill prn ,  to f/u any worsening symptoms or concerns

## 2010-07-08 NOTE — Patient Instructions (Signed)
You had the steroid shot today Take all new medications as prescribed Continue all other medications as before You can also take OTC allegra as needed for allergies All of your refills were sent to the pharmacy today You will be contacted regarding the referral for: Dr Lestine Box - ortho for the left foot Please continue to see Dr Lajoyce Corners for the left knee as you do

## 2010-07-09 ENCOUNTER — Telehealth: Payer: Self-pay

## 2010-07-09 ENCOUNTER — Other Ambulatory Visit (HOSPITAL_COMMUNITY): Payer: Self-pay | Admitting: Otolaryngology

## 2010-07-09 DIAGNOSIS — F329 Major depressive disorder, single episode, unspecified: Secondary | ICD-10-CM

## 2010-07-09 DIAGNOSIS — IMO0002 Reserved for concepts with insufficient information to code with codable children: Secondary | ICD-10-CM

## 2010-07-09 DIAGNOSIS — K118 Other diseases of salivary glands: Secondary | ICD-10-CM

## 2010-07-09 DIAGNOSIS — F32A Depression, unspecified: Secondary | ICD-10-CM

## 2010-07-09 MED ORDER — FLUOXETINE HCL 20 MG PO CAPS: 20.0000 mg | ORAL_CAPSULE | Freq: Every day | ORAL | Status: DC

## 2010-07-09 NOTE — Telephone Encounter (Signed)
Pharmacy requesting clarification on Fluoxetine 20 mg. Quantity #30 day supply and 6 caps daily?

## 2010-07-09 NOTE — Telephone Encounter (Signed)
Original rx was in error  Now corrected and sent to pharmacy

## 2010-07-13 ENCOUNTER — Telehealth: Payer: Self-pay

## 2010-07-13 NOTE — Telephone Encounter (Signed)
Pharmacy informing Diclofenac on long term back order and needs alternative for the patient

## 2010-07-13 NOTE — Telephone Encounter (Signed)
Ok to change to meloxicam 15 qd - to robin to handle

## 2010-07-14 ENCOUNTER — Ambulatory Visit (HOSPITAL_COMMUNITY)
Admission: RE | Admit: 2010-07-14 | Discharge: 2010-07-14 | Disposition: A | Discharge status: Home / Self Care | Payer: Medicare Other | Source: Ambulatory Visit | Attending: Otolaryngology | Admitting: Otolaryngology

## 2010-07-14 ENCOUNTER — Ambulatory Visit (HOSPITAL_COMMUNITY): Payer: Medicare Other

## 2010-07-14 DIAGNOSIS — K118 Other diseases of salivary glands: Secondary | ICD-10-CM

## 2010-07-14 MED ORDER — MELOXICAM 15 MG PO TABS: 15.0000 mg | ORAL_TABLET | Freq: Every day | ORAL | Status: DC

## 2010-07-14 NOTE — Telephone Encounter (Signed)
Addended by: Scharlene Gloss on: 07/14/2010 09:12 AM   Modules accepted: Orders

## 2010-07-20 ENCOUNTER — Ambulatory Visit (HOSPITAL_COMMUNITY)
Admission: RE | Admit: 2010-07-20 | Discharge: 2010-07-20 | Disposition: A | Discharge status: Home / Self Care | Payer: Medicare Other | Source: Ambulatory Visit | Attending: Otolaryngology | Admitting: Otolaryngology

## 2010-07-20 ENCOUNTER — Other Ambulatory Visit (HOSPITAL_COMMUNITY): Payer: Self-pay | Admitting: Otolaryngology

## 2010-07-20 DIAGNOSIS — K118 Other diseases of salivary glands: Secondary | ICD-10-CM

## 2010-07-20 DIAGNOSIS — K1120 Sialoadenitis, unspecified: Secondary | ICD-10-CM | POA: Insufficient documentation

## 2010-07-20 MED ORDER — IOHEXOL 300 MG/ML  SOLN
50.0000 mL | Freq: Once | INTRAMUSCULAR | Status: AC | PRN
  Administered 2010-07-20: 50 mL

## 2010-07-26 ENCOUNTER — Telehealth: Payer: Self-pay

## 2010-07-26 MED ORDER — DICLOFENAC SODIUM 1.5 % TD SOLN: 40.0000 [drp] | Freq: Four times a day (QID) | TRANSDERMAL | Status: DC | PRN

## 2010-07-26 NOTE — Telephone Encounter (Signed)
Pharmacy requesting alternative for Diclofenac 1% gel as is on back order. Please advise.

## 2010-07-26 NOTE — Telephone Encounter (Signed)
Pt to try change to pensaaid soln

## 2010-08-10 NOTE — Assessment & Plan Note (Signed)
[facility]                               LIPID CLINIC NOTE   KEYGAN, DUMOND                        MRN:          [id number]  DATE:07/05/2007                            DOB:          [birth year]    HISTORY OF PRESENT ILLNESS:  [name] is seen in lipid clinic for  evaluation medication titration by Charolotte Eke  PharmD, for evaluation  medication titration associated with his Crestor 20 mg daily and his  Zetia 10 mg daily.  The patient has been tolerating this therapy well.  He is working to follow a low-fat, low-cholesterol diet.  He has had no  chest pain or shortness of breath.  He does continue to have some leg  pain, particularly at his ankle.  He has not had any labs drawn recently  to assess his compliance with cholesterol guidelines.  These labs will  be drawn today.  The patient's weight in the office is 280 pounds, blood  pressure is 125/70 is heart rate is 64.  Labs will be drawn at today's  visit and then further plans for therapy will be made.   ADDENDUM:  I received the patient's labs and talked with him about them  on July 19, 2007.  [name] labs indicated total cholesterol 131,  triglyceride 241, HDL 28.8, LDL 73.  LFTs were within normal limits.  I  have congratulated him on his good work on his LDL cholesterol.  I have  encouraged him to continue his combination of Crestor and Zetia.  I have  gone over with him potential causes for increased triglycerides.  I have  asked him to work to reduce his intake of sweets and use moderation in  his diet.  The patient will follow up in June.  He will have labs drawn  on September 03, 2007, and see Dr. Jens Som on September 06, 2007.   Labs that will be drawn will be LFTs and lipid labs.      Shelby Dubin, PharmD, BCPS, CPP  Electronically Signed      Madolyn Frieze. Jens Som, MD, [facility]  Electronically Signed   MP/MedQ  DD: 07/19/2007  DT: 07/19/2007  Job #: [id number]

## 2010-08-10 NOTE — Assessment & Plan Note (Signed)
[facility]                            CARDIOLOGY OFFICE NOTE   Steven Charles, Steven Charles                        MRN:          [id number]  DATE:03/01/2007                            DOB:          06-20-1945    HISTORY:  Mr. Coye is a pleasant 65 year old gentleman previously  followed by Dr. Samule Ohm.  In December 2007, the patient underwent knee  replacement.  The next day, he had a myocardial infarction and underwent  cardiac catheterization.  He was found to have severe coronary disease  and ultimately underwent coronary artery bypass graft.  The patient had  a LIMA to the LAD, saphenous vein graft to the first diagonal and a  saphenous vein graft to the obtuse marginal.  Note, there were no  graftable targets in the right coronary artery distribution.  His LV  function was preserved as his most recent echocardiogram was performed  on May 11, 2006.  He also has a history of small abdominal aortic  aneurysm by CAT scan in July 2007.  He did have a follow up ultrasound  on December 15, 2006 that did not suggest an aneurysm, but it was a  very difficult exam due to his abdominal girth.  Since he was seen last,  he has not had increased dyspnea on exertion, orthopnea or PND.  He has  chronic pedal edema.  He occasionally feels a brief episode of chest  pain that lasts for 1 second and it is sharp.  Otherwise, he does not  have exertional chest pain.   MEDICATIONS:  1. Lasix 40 mg p.o. daily.  2. Aspirin 325 mg p.o. daily.  3. Potassium 20 mEq p.o. b.i.d.  4. Prevacid 30 mg p.o. daily.  5. Lorazepam 1 mg p.o. daily.  6. Multivitamin.  7. Vitamin C.  8. Calcium.  9. Crestor 20 mg p.o. daily.  10.Carvedilol 12.5 mg p.o. b.i.d.  11.Gabapentin 300 mg p.o. b.i.d.  12.Stool softener.   PHYSICAL EXAMINATION:  VITAL SIGNS:  Blood pressure 131/77, pulse 74.  HEENT:  Normal.  NECK:  Supple with no bruits.  CHEST:  Clear.  CARDIOVASCULAR:  Regular rate  and rhythm.  ABDOMEN:  Shows no tenderness.  EXTREMITIES:  Show trace edema.   DIAGNOSTICS:  Electrocardiogram shows a sinus rhythm at a rate of 75.  The axis is normal.  Prior inferior infarct cannot be excluded.   DIAGNOSES:  1. Coronary artery disease, status post coronary artery bypass graft.      The patient is doing well from a symptomatic standpoint.  We will      continue with his beta blocker, aspirin and statin.  I also had      long discussions with him today concerning risk factor      modification.  He does not smoke.  We discussed the importance of      diet and exercise.  2. Hyperlipidemia.  We will check lipids and liver, and adjust his      regimen as indicated with a goal LDL of less than 70.  3. Hypertension.  His blood  pressure is adequately controlled on his      present medications.  4. History of chronic pain in his lower extremity on the left due to      previous accident.  He will follow up with his primary care      physician concerning this issue.  5. History of small abdominal aortic aneurysm.  We will plan to repeat      his CTA (he did have an ultrasound in September that suggested no      aneurysm, but this was a very difficult study).  We will check a      BMET prior to the procedure.   FOLLOW UP:  We will see him back in 6 months.     Madolyn Frieze Jens Som, MD, [facility]  Electronically Signed    BSC/MedQ  DD: 03/01/2007  DT: 03/01/2007  Job #: [id number]

## 2010-08-10 NOTE — Assessment & Plan Note (Signed)
[facility]                               LIPID CLINIC NOTE   Steven Charles, Steven Charles                        MRN:          [id number]  DATE:03/15/2007                            DOB:          [birth year]    [name] was seen in the [facility] on March 15, 2007.  [name] comes in today for his initial visit with [place] in the lipid  clinic.  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  He was referred to the lipid  clinic by Dr. Jens Som.  His primary care physician is Dr. Oliver Barre.   CURRENT MEDICATIONS FOR CHOLESTEROL LOWERING:  Crestor 20 mg daily.   OTHER MEDICATIONS:  1. Lasix.  2. Coreg.  3. Aspirin 325 mg.  4. Potassium.  5. Prevacid.  6. Gabapentin.  7. Lorazepam.  8. Nabumetone.  9. Docusate.  10.Multivitamin.  11.Calcium.  12.Vitamin C.   In the past, he has been on other cholesterol medicines, including  Lipitor, Zocor, Vytorin, and has had no problems with tolerating this.   ALLERGIES:  NICODERM PATCH with dermatitis.   SOCIAL HISTORY:  He does not use any tobacco products or drink alcoholic  beverages.  He tries to avoid fried foods.  Does not eat many fried  foods at all.  Most of the time, he has chicken and pork when he has  meat, and very little red meat intake.  Exercise is limited by his left  leg injury and pain, although he is able to do an elliptical machine and  does like to lift weights.   PHYSICAL EXAM:  Weight 278, blood pressure 140/75, heart rate is 68.   Laboratory data includes a total cholesterol 146, triglycerides 272, HDL  28.9, LDL 80.7.  Liver function tests within normal limits.   ASSESSMENT:  Mr. Soy is tolerating his current Statin regimen, but his  LDL remains elevated as do his  triglycerides.  His HDL is low.   PLAN:  Increase Crestor from 20 mg daily to 40 mg daily in hopes that  the LDL will get below 70 and triglycerides will get closer to goal of  less than 150.  He may very well need medication to help with his  triglycerides, as I do not foresee the increase in Crestor dose will  bring that to goal.  I asked him to continue with the heart-healthy diet  and exercise as tolerated.  We are going to follow up in 8 weeks, at  which time we will repeat a lipid and liver panel, and he was encouraged  to call with any questions or concerns in the meantime.      Charolotte Eke, PharmD  Electronically Signed      Madolyn Frieze. Jens Som, MD, [facility]  Electronically Signed   TP/MedQ  DD: 03/15/2007  DT: 03/16/2007  Job #: [id number]

## 2010-08-10 NOTE — Assessment & Plan Note (Signed)
[facility]                            CARDIOLOGY OFFICE NOTE   Steven Charles, Steven Charles                        MRN:          [id number]  DATE:08/31/2006                            DOB:          06-02-1945    PRIMARY CARE PHYSICIAN:  Corwin Levins, M.D.   HISTORY OF PRESENT ILLNESS:  Steven Charles is a 65 year old gentleman who  underwent left total knee replacement on March 15, 2006.  The  following day, he suffered a myocardial infarction with the left main as  the culprit lesion.  He underwent emergent coronary artery bypass  grafting by Dr. Tyrone Sage.  EF is normal.  He is recovering fairly nicely  from a cardiac viewpoint.  However, he has been slowed by impaired  motion of his left knee.   CURRENT MEDICATIONS:  1. Carvedilol 12.5 mg twice daily.  2. Klor-Con 20 mg daily.  3. Prevacid 30 mg daily.  4. Gabapentin 600 mg daily.  5. Ativan 1 mg twice daily.  6. Aspirin 325 mg daily.  7. Multivitamin.  8. Calcium.  9. Vitamin D.  10.Vitamin C.  11.Stool softener.  12.Crestor 20 mg daily.  13.Lasix 20 mg daily.  14.Vitamin E.   PHYSICAL EXAM:  He is generally well-appearing in no distress.  Heart rate 71, blood pressure 116/70, weight 270 pounds.  Weight is up 6  pounds from 2 months ago.  He has no jugular venous distension, thyromegaly, or lymphadenopathy.  LUNGS:  Clear to auscultation.  Respiratory effort is normal.  He has a nondisplaced point of maximal impulse.  There is a regular rate  and rhythm without murmur, rub, or gallop.  ABDOMEN:  Obese, soft, nondistended, nontender.  There is no  hepatosplenomegaly.  Bowel sounds are normal.  EXTREMITIES:  Warm without edema.  Flexion of the left knee is impaired.   IMPRESSION/RECOMMENDATIONS:  1. Coronary artery disease:  Doing nicely after non-ST-elevation      myocardial infarction and emergent CABG.  Ejection fraction is      normal.  Continue current medications.  2.  Hypercholesterolemia:  Continue current therapy.  Due for BMET at      next visit.  3. Pain control:  The patient asks about the cardiac risk of non-      steroidal anti-inflammatories.  I told him there is very likely an      increased risk.  However, he does have substantial pain and the      small cardiac risk needs to be balanced against risk of addiction      with continued narcotic use.  I think the cardiac risk is small.  I      will leave final decision to the patient and Dr. Lajoyce Corners.     Salvadore Farber, MD  Electronically Signed    WED/MedQ  DD: 08/31/2006  DT: 08/31/2006  Job #: [id number]

## 2010-08-10 NOTE — Assessment & Plan Note (Signed)
[facility]                            CARDIOLOGY OFFICE NOTE   Steven Charles, Steven Charles                        MRN:          [id number]  DATE:09/06/2007                            DOB:          15-Sep-1945    Steven Charles is a very pleasant gentleman who has a history of coronary  artery disease, status post coronary artery bypassing graft in December  2007.  His LV function is normal.  Since I last saw him, he is doing  well from a symptomatic standpoint.  There is no dyspnea, chest pain,  palpitations, or syncope and there is no pedal edema.  Note, he did have  a followup CT scan of his abdomen in December.  There was no evidence of  an aneurysm.   MEDICATIONS:  1. Gabapentin 300 mg p.o. b.i.d.  2. Zetia 10 mg p.o. daily.  3. Lisinopril 20 mg p.o. daily.  4. Crestor 20 mg p.o. daily.  5. Lasix 20 mg p.o. b.i.d.  6. Aspirin 325 mg p.o. daily.  7. Potassium 20 mEq p.o. b.i.d.  8. Prevacid.  9. Lorazepam.  10.Multivitamin.  11.Vitamin C.  12.Calcium.  13.Carvedilol 12.5 mg p.o. b.i.d.  14.Stool softener.   PHYSICAL EXAMINATION:  Blood pressure of 138/82 and his pulse is 62.  He  weighs 278 pounds.  HEENT:  Normal.  NECK:  Supple with no bruits.  CHEST:  Clear.  CARDIOVASCULAR:  Regular rate and rhythm.  ABDOMINAL:  No tenderness.  EXTREMITIES:  No edema.   Electrocardiogram today shows a sinus rhythm at a rate of 59.  The axis  is normal.  There are no significant ST changes.   DIAGNOSES:  1. Coronary artery disease, status post coronary artery bypassing      graft - Steven Charles is doing well from a symptomatic standpoint.  We      will continue with his present medications including his aspirin,      statin, beta-blocker, and ACE inhibitor.  He will continue with      risk factor modification including diet and exercise.  He does not      smoke.  2. Hyperlipidemia - he will continue on his present dose of statin.  3. Hypertension - his blood  pressure is adequately controlled on his      present medications.  He did have a BMET drawn in March that showed      normal renal function and normal potassium.  4. History of chronic pain in his left lower extremity due to previous      accident.  5. History of small abdominal aortic aneurysm - his followup CT showed      no aneurysm.   We will see him back in 12 months.     Madolyn Frieze Steven Som, MD, [facility]  Electronically Signed    BSC/MedQ  DD: 09/06/2007  DT: 09/06/2007  Job #: [id number]

## 2010-08-13 NOTE — Assessment & Plan Note (Signed)
[facility]                            CARDIOLOGY OFFICE NOTE   KAIRAV, RUSSOMANNO                        MRN:          [id number]  DATE:05/26/2006                            DOB:          03-Mar-1946    PRIMARY CARE PHYSICIAN:  Corwin Levins, M.D.   HISTORY OF PRESENT ILLNESS:  Mr. Blissett is a 65 year old gentleman who  underwent a left total knee replacement on March 15, 2006.  On the  first postoperative day he suffered a myocardial infarction with the  left main as the culprit lesion.  He underwent an emergent coronary  artery bypass graft surgery by Dr. Sheliah Plane.  When I saw him  three weeks ago he was recovering fairly slowly.  We got an  echocardiogram that showed preserved left ventricular systolic function.  Since that time, his recovery has hastened and appears to be on course.  He continues to be limited by his knee.  He is participating in rehab  for that, as well as cardiac rehab.   CURRENT MEDICATIONS:  1. Lasix 40 mg daily.  2. Carvedilol 12.5 mg b.i.d.  3. Klor-Con 40 mEq daily.  4. Prevacid 30 mg daily.  5. Gabapentin 300 mg t.i.d.  6. Lorazepam 1 mg t.i.d.  7. Aspirin 325 mg daily.  8. Vytorin 10/80 mg, one daily.  9. A multivitamin.  10.Calcium.  11.Vitamin D.  12.A stool softener.   PHYSICAL EXAMINATION:  GENERAL:  He is overweight, but otherwise well-  appearing, in no distress.  VITAL SIGNS:  Heart rate 88, blood pressure 126/77, weight 264 pounds.  NECK:  No jugular venous distention, thyromegaly or lymphadenopathy.  LUNGS:  Clear to auscultation.  HEART:  He has a non-displaced point of maximal cardiac impulse.  A  regular rate and rhythm without murmur, rub or gallop.  ABDOMEN:  Obese, soft, non-distended, nontender.  No hepatosplenomegaly.  EXTREMITIES:  Warm without edema.  The left knee is in a brace.   IMPRESSION/RECOMMENDATIONS:  1. Coronary artery disease:  Doing nicely after a non-ST elevation   myocardial infarction and coronary artery bypass graft surgery.      The ejection fraction is normal.  Continue the current medications.  2. Hypercholesterolemia:  Continue Crestor for now, so as not to      confuse the picture.  Would consider switching to Zocor at the next      visit.   FOLLOWUP:  Follow up in three months.     Salvadore Farber, MD  Electronically Signed    WED/MedQ  DD: 05/26/2006  DT: 05/26/2006  Job #: [id number]
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
## 2010-08-13 NOTE — Op Note (Signed)
NAME:  Steven Charles, Steven Charles                 ACCOUNT NO.:  [id number]   MEDICAL RECORD NO.:  [id number]          PATIENT TYPE:  INP   LOCATION:  2550                         FACILITY:  [facility]   PHYSICIAN:  Nadara Mustard, MD     DATE OF BIRTH:  12/27/1945   DATE OF PROCEDURE:  10/05/2005  DATE OF DISCHARGE:                                 OPERATIVE REPORT   PREOPERATIVE DIAGNOSIS:  Closed left patella midshaft fracture.   POSTOPERATIVE DIAGNOSIS:  Closed left patella midshaft fracture.   PROCEDURE:  Open reduction, internal fixation, left patella.   SURGEON:  Nadara Mustard, MD   ANESTHESIA:  General.   ESTIMATED BLOOD LOSS:  Minimal.   ANTIBIOTICS:  1 g of Kefzol.   DRAINS:  None.   COMPLICATIONS:  None.   TOURNIQUET TIME:  None.   DISPOSITION:  To PACU in stable condition.   INDICATION FOR PROCEDURE:  The patient is a 65 year old gentleman who fell  in his yard and sustained a left patellar fracture.  The patient had a  displaced fracture with loss of extensor mechanism and presents at this time  for internal fixation.  Risks and benefits were discussed including  infection, neurovascular injury, persistent pain, need for additional  surgery.  The patient states he understands and wished to proceed at this  time.   DESCRIPTION OF PROCEDURE:  The patient was brought to OR room 10 and  underwent a general anesthetic.  After an adequate level of anesthesia  obtained, the patient's left lower extremity was prepped using DuraPrep and  draped into a sterile field.  An Steven Charles was used to cover all exposed skin.  A longitudinal midline incision was made over the patella.  This was made  medial to the midline to allow for least 6 cm of distance between the  lateral incision from his distal femur fracture and the midline incision.  The incision was made.  This was carried down to the retinaculum.  The edges  of the fracture were freshened.  Loose bone debris was removed.  The  fracture  was reduced and this was then stabilized with two longitudinal 2.00  mm K-wires.  A figure-of-eight tension band was then applied to the K-wires,  stabilizing the fracture.  The proximal aspect of the a K-wires was bent  over and then pulled into the bone and the distal aspect was cut just distal  to the 20-gauge figure-of-eight wire.  C-arm fluoroscopy verified reduction  of both AP and lateral planes.  The wound was irrigated throughout the case.  The retinacular fascia was closed using 2-0 Vicryl, subcu was closed using  Vicryl.  The skin was closed using Proximate staples.  The wound was covered  with Adaptic orthopedic sponges, sterile Webril and a Coban dressing.  The  patient's knee  immobilizer was reapplied.  He was placed through a full range of motion  prior to closure and there was no gapping of the fracture with range of  motion.  Range of motion was 0-45 degrees.  The patient was extubated, taken  to the  PACU in stable condition.  Plan for 23-hour observation.  Follow up  in the office in 2 weeks.      Nadara Mustard, MD  Electronically Signed     MVD/MEDQ  D:  10/05/2005  T:  10/05/2005  Job:  [id number]
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
## 2010-08-13 NOTE — Discharge Summary (Signed)
[place]  Patient:    Steven Charles, Steven Charles                        MRN: [id number] Adm. Date:  [id number] Disc. Date: [id number] Attending:  Trauma, Md Dictator:   Vilinda Blanks. Moye, P.A.-C. CC:         Delorse Lek, M.D., [facility]                           Discharge Summary  DIAGNOSES: 1. Status post fall from a ladder with T7 fracture without retropulsion. 2. Cervical spine strain. 3. A 5 mm right middle lobe nodule found by CT scan of the chest. 4. Right upper lobe soft tissue density also found by CT scan of chest.  PROCEDURES:  None.  DISCHARGE MEDICATIONS: 1. Vicodin 1 to 2 p.o. q.4-6h. p.r.n. pain. 2. Robaxin 500 mg 2 p.o. q.i.d. p.r.n. muscle spasms. 3. Celebrex 200 mg p.o. q.a.m. for inflammation. 4. Over-the-counter laxative as needed for constipation. 5. Resume all other home medications.  FOLLOWUP APPOINTMENTS: 1. Trauma clinic in two weeks. 2. Follow up with Dr. Doristine Counter with family practice for findings of    pulmonary nodules.  DISPOSITION:  The patient is discharged home with instructions for no work, no driving, and no lifting.  HOSPITAL COURSE:  This is a 65 year old white male who fell from a ladder while working today.  He fell on his back.  He suffered no loss of consciousness, remained hemodynamically stable, and arrived complaining only of back pain.  Computed tomography scan revealed fracture of T7 vertebral body with anterior column compression-type fracture.  There was coincidental finding of a 5 mm right middle lobe nodule as well as a right upper lobe soft tissue density with associated atelectasis.  The patient was admitted for observation and pain control.  Followup computed tomography scan of the thoracic spine showed no retropulsion of the T7 fracture.  Followup flexion and extension films of the cervical spine showed no instability.  With pain control, the patient was ambulated and  tolerated this well.  He tolerated the diet well.  His main complaints on the day of discharge were muscle spasms.  These were controlled with Robaxin as well as anti-inflammatories and narcotic pain relief as needed.  The nodule in the right middle lobe and abnormal density in the right upper lobe of the lung were both discussed with the patient and his wife.  It was recommended that followup CT scanning of the chest should be obtained.  It was recommended that the patient follow up with his primary care physician for further workup of these findings. DD:  10/01/99 TD:  10/01/99 Job: [id number]
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
## 2010-08-13 NOTE — Op Note (Signed)
NAME:  TELFORD, ARCHAMBEAU NO.:  [id number]   MEDICAL RECORD NO.:  [id number]          PATIENT TYPE:  INP   LOCATION:  2307                         FACILITY:  [facility]   PHYSICIAN:  Sheliah Plane, MD    DATE OF BIRTH:  08/05/1945   DATE OF PROCEDURE:  03/25/2006  DATE OF DISCHARGE:                               OPERATIVE REPORT   PREOPERATIVE DIAGNOSIS:  Dehiscence of sternal wound.   POSTOPERATIVE DIAGNOSIS:  Dehiscence of sternal wound.   PROCEDURE:  Rewiring of sternal wound and evacuation of left pleural  effusion.   SURGEON:  Sheliah Plane, M.D.   BRIEF HISTORY:  The patient is a 65 year old male who was admitted to  the hospital for elective knee replacement which he underwent. Early in  the postoperative period while still in the hospital suffered myocardial  infarction and had emergency coronary artery bypass grafting by Salvatore Decent. Dorris Fetch, M.D. approximately 8 days ago.  Postoperatively, the  patient developed increasing cough, respiratory difficulty, and felt a  single pop of the lower sternal incision.  Initially his sternum  appeared stable.  However on continued observation the wound began  breaking down and chest x-ray films confirmed dehiscence and tearing of  the sternal wires through the bone.  Because of these findings a sternal  rewiring was recommended to the patient who agreed and signed informed  consent.   DESCRIPTION OF PROCEDURE:  Under general endotracheal anesthesia, the  patient was prepped and draped in the usual sterile fashion.  There was  some very slight separation of incision in the midportion.  The incision  was opened.  The majority of the skin was intact, however, none of the  bone except the manubrium was intact.  Sternal wires had pulled through  both the left and right side of the sternum.  Cultures were obtained,  but the field did not appear infected.  Old hematoma was removed.  #6  stainless steel wires were woven  longitudinally down on each side of the  sternum initially.  Blake drain was left in the deep part of the  mediastinum.  The sternum was then reapproximated with simple double  stranded wires and also figure-of-eight wires.  The sternum was  reapproximated and this gave good stability.  The fascia was closed with  interrupted 0 Vicryl.  Subcutaneous tissue with running 2-0 Vicryl and  skin staples stapled the skin edges.  During the procedure, the left  pleural space was identified and approximately 800 mL of old pleural  effusion was removed.  The patient tolerated the procedure without  obvious complications and was transferred back to the surgical intensive  care unit for further postoperative care.      Sheliah Plane, MD  Electronically Signed     EG/MEDQ  D:  03/25/2006  T:  03/25/2006  Job:  [id number]   cc:   Nadara Mustard, MD
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
## 2010-08-13 NOTE — Assessment & Plan Note (Signed)
[facility]                               LIPID CLINIC NOTE   MARVIN, GRABILL                        MRN:          [id number]  DATE:05/11/2007                            DOB:          20-Jan-1946    Mr. Larmon is seen back in the lipid clinic for further evaluation and  medication titration associated with his hyperlipidemia in the setting  of documented coronary disease and status post coronary artery bypass  grafting.  The patient has been feeling and doing well overall.  He has  had no muscle aches, pains, weakness, fatigue or other problems.  He has  had no chest pain or shortness of breath.   He is currently taking Crestor 40 mg daily.  His other medications  remain unchanged.  He has been paying attention to his diet.  He has had  no muscle aches, pains, weakness, fatigue or other problems.  He has  been limited in his exercise by his left leg and chronic injury.   PHYSICAL EXAM:  Blood pressure is 132/78, heart rate is 64, respirations  are 16.   LABORATORIES:  Reveal triglycerides improved to 214, HDL 30, LDL at 85,  LFTs within normal limits.   Mr. Dalto has not seen any improvement since increasing his Crestor to  40 mg daily.  We will:   1. Reduce Crestor back down to 20 mg daily.  2. We will add Zetia 10 mg daily to assist with LDL reduction.   Patient will follow up in eight weeks to assess compliance.      Shelby Dubin, PharmD, BCPS, CPP  Electronically Signed      Rollene Rotunda, MD, [facility]  Electronically Signed   MP/MedQ  DD: 05/10/2007  DT: 05/11/2007  Job #: [id number]   cc:   Madolyn Frieze. Jens Som, MD, [facility]
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
## 2010-08-13 NOTE — Assessment & Plan Note (Signed)
[facility]                            CARDIOLOGY OFFICE NOTE   JOSEDE, CICERO                        MRN:          [id number]  DATE:05/05/2006                            DOB:          June 05, 1945    PRIMARY CARE PHYSICIAN:  Corwin Levins, MD.   HISTORY OF PRESENT ILLNESS:  Mr. Sohn is a 65 year old gentleman who  underwent left total knee replacement on March 15, 2006.  On  postoperative day one he suffered myocardial infarction which appeared  with the left main as the apparent culprit lesion.  He then went to  emergent coronary artery bypass grafting by Dr. Tyrone Sage.  He has had a  slow but steady recovery since then.  He has not had any recurrent  anginal pain.  He does say that he is fairly short of breath when  participating in rehabilitation for his knee.  He is not having any PND  or orthopnea.  He has had some minimal bipedal edema.   CURRENT MEDICATIONS:  1. Lasix 40 mg per day.  2. Carvedilol 12.5 mg twice per day.  3. Klor-Con 40 mEq per day.  4. Prevacid 30 mg per day.  5. Gabapentin 300 mg three times per day.  6. Lorazepam 1 mg three times per day.  7. Aspirin 325 mg per day.  8. Vytorin 10/80 one per day.  9. Multivitamin.  10.Calcium.  11.Vitamin D.  12.A stool softener.   PHYSICAL EXAMINATION:  He is obese but otherwise well appearing, in no  distress with heart rate 78, blood pressure 127/80, and weight of 261  pounds.  He has no jugular venous distention, thyromegaly,  lymphadenopathy.  Respiratory effort is normal.  LUNGS:  Clear to auscultation.  Median sternotomy is healing nicely.  He has a nonpalpable point of maximal cardiac impulse.  There is a  regular rate and rhythm without murmur, rub, or gallop.  ABDOMEN:  Obese, soft, nondistended, nontender.  There is no  hepatosplenomegaly.  No pulsatile midline mass.  EXTREMITIES:  Warm without edema.  Left knee is in a brace.   Chest x-ray is pending.   Electrocardiogram demonstrates normal sinus rhythm and minor nonspecific  ST-T abnormalities in the inferior leads.   IMPRESSION/RECOMMENDATION:  Coronary artery disease status post  myocardial infarction:  Doing well, may have some heart failure though  difficult to tease out from the fatigue  after his 2 surgeries.  Will continue current medications and check  echocardiogram.  Follow up with me after echocardiogram in approximately  6 weeks.     Salvadore Farber, MD  Electronically Signed    WED/MedQ  DD: 05/05/2006  DT: 05/05/2006  Job #: [id number]   cc:   Corwin Levins, MD

## 2010-08-13 NOTE — Cardiovascular Report (Signed)
NAME:  Steven Charles, LAROSE NO.:  [id number]   MEDICAL RECORD NO.:  [id number]          PATIENT TYPE:  INP   LOCATION:  2308                         FACILITY:  [facility]   PHYSICIAN:  Salvadore Farber, MD  DATE OF BIRTH:  08-07-1945   DATE OF PROCEDURE:  03/16/2006  DATE OF DISCHARGE:                            CARDIAC CATHETERIZATION   PROCEDURE:  Left heart catheterization, left ventriculography, coronary  angiography, AngioSeal closure of right common femoral arteriotomy site,  placement of intra-aortic balloon pump via the left common femoral  arteriotomy site.   INDICATIONS:  Steven Charles is a 65 year old gentleman with atherosclerotic  disease, diagnostic cardiac catheterization in 1992.  Reportedly, that  demonstrated 50% stenosis in the LAD.  He underwent left total knee  replacement yesterday.  Today, he has complained of neck pain.  That  prompted electrocardiogram which demonstrated deep ST depressions  anteriorly with 1/2 mm ST elevation in lead III alone.  Electrocardiographic abnormalities markedly improved after sublingual  nitroglycerin.  However, pain recurred as did the electrocardiographic  abnormalities.  Dr. Myrtis Ser saw the patient and discussed it with Dr. Lajoyce Corners  who felt that anticoagulation needed for appropriate care of his heart  was reasonable despite his recent surgery.  Specifically, he felt that  bleeding into the knee could be handled if necessary.  The patient  arrived in the cardiac catheterization lab with ongoing 8/10 right-sided  neck discomfort and deep anterolateral ST depressions.   PROCEDURAL TECHNIQUE:  Informed consent was obtained.  Under 1%  lidocaine local anesthesia, a 6-French sheath was placed in the right  common femoral artery using modified Seldinger technique.  Diagnostic  angiography was performed using first a JL-4 and then a JL-5 catheter.  Both produced damping in the left main.  Diagnostic angiography to the  right  system was performed using a JR-4 catheter.  Left heart  catheterization and ventriculography were performed using a pigtail  catheter.  These images demonstrated severe multivessel coronary  disease.  There was a short segment of occlusion of the right coronary  artery.  There was then collateralization of the distal right coronary  from the circumflex.  In addition, he had 60% ostial and 60% distal left  main stenosis, and at least a 90% stenosis of the LAD.  There was TIMI  III flow to the LAD and circumflex territories.   Based on these films, it was unclear whether his culprit lesion was the  RCA or the LAD.  Specifically, it was unclear to me whether the right  coronary artery was chronic totally occluded versus had a short  subtotally occlusive thrombus in it.  Electrocardiogram with slight ST  elevations in lead III alone, but deep depressions anteriorly was closed  suggesting that the anterior wall with probable.  However, we surmised  that it was possible that he had preexisting right to left collaterals  which may have reversed with occlusion of the right.   While we felt he would ultimately need surgical revascularization, his  risk would be elevated due to the markedly elevated left ventricular end-  diastolic pressure.  I discussed these findings and the patient's  situation at length with Dr. Charlett Lango.  Due to his elevated  surgical risk and the possibility that the right coronary was the  culprit, we elected to proceed with an attempt at percutaneous  revascularization of the RCA as a palliative measure.   I, therefore, initiated anticoagulation with heparin.  ACT was greater  than 225 seconds.  I placed a balloon pump via the left common femoral.  Counterpulsation was initiated 1:1.  With this, electrocardiogram  normalized and his neck pain resolved.  I advanced a JR-4 guiding  catheter over wire and engaged in the ostium of the right coronary  artery.  I  briefly probed the RCA with a Prowater wire and then a  whisper wire.  The plaque was clearly hard, and the wire would not  penetrate at all.  It was thus clear that this was a chronic total  occlusion rather than a fresh thrombus.  I, therefore, aborted further  attempts.   I then imaged the right groin.  It demonstrated a high bifurcation with  the sheath entering the bifurcation of the SFA and profunda.  These  vessels were free of disease.  Due to the need to move his right leg for  vein harvest, I elected to go ahead and close the arteriotomy using an  AngioSeal, recognizing that it was in the bifurcation.  A 6-French Angio-  Seal was used to close the arteriotomy and complete hemostasis was  obtained.  The patient was then transferred directly to the operating  room in stable condition pain free.   COMPLICATIONS:  None.   FINDINGS:  1. LV:  130/30/44 (placement of intra-aortic balloon pump).  2. EF 47% with severe anterolateral hypokinesis and apical akinesis.  3. Left main:  60% stenosis of the ostium and 60% stenosis distally.  4. LAD:  Moderate-sized vessel giving rise to a single diagonal      branch.  The proximal LAD is a long 90% stenosis.  The diagonal has      an 80% stenosis proximally.  The distal vessels are suitable      targets for surgical revascularization.  5. Circumflex:  Large vessel with only minor luminal irregularities.      It gives rise to a large first marginal and a moderate-sized second      marginal.  6. RCA:  Moderate-sized dominant vessel.  It is totally occluded      proximally with both bridging collaterals and left-to-right      collaterals via the circumflex.   IMPRESSION/PLAN:  The patient has severe multivessel coronary disease.  After probing the right coronary, it is clear that his right RCA lesion  is a chronic total occlusion.  Thus, either his left main or the LAD is the culprit, likely the left anterior descending artery.  With  these  findings, I have consulted Dr. Dorris Fetch who is preceding now to  emergent coronary artery bypass grafting surgery.  The patient and  family are aware of findings and plan.      Salvadore Farber, MD  Electronically Signed     WED/MEDQ  D:  03/16/2006  T:  03/17/2006  Job:  [id number]   cc:   Corwin Levins, MD  Luis Abed, MD, [facility]

## 2010-08-13 NOTE — Op Note (Signed)
NAME:  Steven Charles, Steven Charles NO.:  [id number]   MEDICAL RECORD NO.:  [id number]          PATIENT TYPE:  INP   LOCATION:  2022                         FACILITY:  [facility]   PHYSICIAN:  Salvatore Decent. Dorris Fetch, M.D.DATE OF BIRTH:  Nov 24, 1945   DATE OF PROCEDURE:  03/17/2006  DATE OF DISCHARGE:  03/31/2006                               OPERATIVE REPORT   PREOPERATIVE DIAGNOSIS:  Left main and three-vessel coronary disease  with evolving myocardial infarction.   POSTOPERATIVE DIAGNOSIS:  Left main and three-vessel coronary disease  with evolving myocardial infarction.   PROCEDURE:  Emergency median sternotomy, extracorporeal circulation,  coronary bypass grafting x3 (left internal mammary artery to LAD,  saphenous vein graft to first diagonal, saphenous vein graft obtuse  marginal).   SURGEON:  Salvatore Decent. Dorris Fetch, M.D.   ASSISTANT:  Rowe Clack, P.A.-C.   ANESTHESIA:  General.   FINDINGS:  No graftable targets in right coronary distribution, mammary  into the saphenous vein good-quality, good-quality targets.   CLINICAL NOTE:  Steven Charles is a 65 year old gentleman who underwent total  knee replacement by Dr. Aldean Baker on 03/15/2006.  On 03/17/2006 the  patient developed chest pain and shortness of breath.  He had marked EKG  changes, then was taken emergently to the cardiac catheterization  laboratory.  There was found to have left main and three-vessel coronary  disease.  There was a chronically totally occluded right coronary, 60%  left main stenosis, 90% stenosis in the proximal LAD.  An intra-aortic  balloon pump was placed.  The patient did stabilize.  He was then taken  emergently to the operating room after being advised of the indications,  benefits and alternative treatments.  Steven Charles and his family  understood and accepted risks and agreed to proceed.   OPERATIVE NOTE:  Steven Charles was brought to the operating room directly  from the catheterization  laboratory.  Here Anesthesia placed a Swan-Ganz  catheter for monitoring central venous and pulmonary arterial pressures.  An arterial blood pressure monitoring catheter was placed, an intra-  aortic balloon pump was in place.  The patient was anesthetized and  intubated.  A Foley catheter was placed.  The chest, abdomen and legs  were prepped and draped usual fashion.  Incision was made in the medial  aspect of the right leg and greater saphenous vein was harvested from  the right leg endoscopically.  Simultaneously a median sternotomy was  performed left internal mammary artery was harvested.  The patient did  remain hemodynamically stable throughout the vessel harvest.  The  patient was fully heparinized.  The pericardium was opened after  confirming adequate anticoagulation with ACT measurement.  The aorta and  right atrium were cannulated.  Cardiopulmonary bypass was instituted and  flows were maintained per protocol throughout the procedure.  The  coronary arteries were inspected and anastomotic sites were chosen.  Of  note there were no graftable targets in the distal right coronary  distribution.  The conduits were inspected and cut to length.  A foam  pad was placed in the pericardium to protect  the left phrenic nerve.  A  temperature probe was placed in myocardial septum.  A cardioplegic  cannula placed in the ascending aorta.  The aorta was crossclamped, left  ventricle was emptied via aortic root vent.  Cardiac arrest was achieved  with a combination of cold antegrade blood cardioplegia and topical iced  saline.  After achieving a complete diastolic arrest and adequate  myocardial septal cooling, the following and distal anastomoses were  performed.   First a reverse saphenous vein graft was placed end-to-side to the  obtuse marginal branch of the left circumflex.  This was a good-quality  target.  The vein was good quality.  The anastomosis was performed with  a running 7-0  Prolene suture.  At completion of each anastomosis, the  anastomosis was probed proximally and distally to ensure patency and  then cardioplegia was administered down the vein grafts.   Next a reverse saphenous vein graft was placed end-to-side to the first  diagonal branch of the LAD.  This also was a good-quality target.  The  vein again was of good quality.   Next the left internal mammary artery was brought through a window in  the pericardium.  The distal end was beveled.  It was then anastomosed  end-to-side to the LAD.  The LAD was likewise a good-quality target.  The mammary was good quality vessel as well.  The mammary to LAD  anastomosis was performed with running 8-0 Prolene suture.  At  completion of anastomosis bulldog clamp was removed to inspect for  hemostasis.  Immediate rapid septal rewarming was noted.  The bulldog  clamp was replaced.  The mammary pedicle was tacked to the epicardial  surface of the heart with 6-0 Prolene sutures.  Additional cardioplegia  was administered.  The vein grafts were cut to length.  The proximal  vein graft anastomoses were performed to 4.5 mm punch aortotomies with 6-  0 running Prolene sutures while under crossclamp.  At the completion of  all proximal anastomosis lidocaine was administered.  The patient was  placed Trendelenburg position.  The bulldog clamps again removed from  left mammary artery and immediate rapid septal rewarming was again  noted.  The aortic root was de-aired.  The aortic crossclamp was  removed.  The total crossclamp time was 55 minutes.   All proximal and distal anastomoses were inspect for hemostasis.  Epicardial pacing wires were placed on the right ventricle, right atrium  and the patient rewarmed to a core temperature 37 degrees Celsius, he  was weaned from cardiopulmonary bypass on the first attempt without  difficulty.  The intra-aortic balloon pump was resumed one-to-one.  The initial cardiac index was  greater than 2 liters per minute per meter  squared.  The patient remained hemodynamically stable throughout post  bypass period.   A test dose protamine was administered and was well tolerated. The  atrial and aortic cannulae were removed.  The remaining protamine was  administered without incident.  The chest irrigated with 1 liter of warm  normal saline containing 1 gram of vancomycin.  Hemostasis was achieved.  Pericardium was reapproximated over the ascending aorta with interrupted  3-0 silk sutures.  A left pleural and two mediastinal chest tubes placed  through separate subcostal incisions.  The sternum was closed with a  combination of single and double heavy gauge stainless steel wires.  The  remaining incisions closed in standard fashion.  Subcuticular closure  used for the skin.  All sponge,  needle and instrument counts were  correct at the procedure.  The patient remained hemodynamically stable  throughout the post bypass period and was taken from the operating room  to the surgical intensive care unit in critical but stable condition.           ______________________________  Salvatore Decent Dorris Fetch, M.D.    SCH/MEDQ  D:  04/04/2006  T:  04/05/2006  Job:  [id number]   cc:   Salvadore Farber, MD  Luis Abed, MD, [facility]  Corwin Levins, MD  Nadara Mustard, MD
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
## 2010-09-01 ENCOUNTER — Encounter: Payer: Self-pay | Admitting: Cardiology

## 2010-09-27 ENCOUNTER — Ambulatory Visit (INDEPENDENT_AMBULATORY_CARE_PROVIDER_SITE_OTHER): Payer: Medicare Other | Admitting: Cardiology

## 2010-09-27 ENCOUNTER — Encounter: Payer: Self-pay | Admitting: Cardiology

## 2010-09-27 DIAGNOSIS — I10 Essential (primary) hypertension: Secondary | ICD-10-CM

## 2010-09-27 DIAGNOSIS — E785 Hyperlipidemia, unspecified: Secondary | ICD-10-CM

## 2010-09-27 DIAGNOSIS — Z01810 Encounter for preprocedural cardiovascular examination: Secondary | ICD-10-CM | POA: Insufficient documentation

## 2010-09-27 DIAGNOSIS — Z79899 Other long term (current) drug therapy: Secondary | ICD-10-CM

## 2010-09-27 DIAGNOSIS — I2510 Atherosclerotic heart disease of native coronary artery without angina pectoris: Secondary | ICD-10-CM

## 2010-09-27 DIAGNOSIS — E7800 Pure hypercholesterolemia, unspecified: Secondary | ICD-10-CM

## 2010-09-27 DIAGNOSIS — R0989 Other specified symptoms and signs involving the circulatory and respiratory systems: Secondary | ICD-10-CM

## 2010-09-27 LAB — BASIC METABOLIC PANEL
BUN: 26 mg/dL — ABNORMAL HIGH (ref 6–23)
CO2: 29 mEq/L (ref 19–32)
Calcium: 8.8 mg/dL (ref 8.4–10.5)
GFR: 70.21 mL/min (ref >60.00)
Glucose, Bld: 83 mg/dL (ref 70–99)
Sodium: 142 mEq/L (ref 135–145)

## 2010-09-27 LAB — HEPATIC FUNCTION PANEL
ALT: 20 U/L (ref 0–53)
AST: 18 U/L (ref 0–37)
Bilirubin, Direct: 0.1 mg/dL (ref 0.0–0.3)
Total Bilirubin: 0.7 mg/dL (ref 0.3–1.2)

## 2010-09-27 LAB — LIPID PANEL: Total CHOL/HDL Ratio: 3

## 2010-09-27 NOTE — Assessment & Plan Note (Signed)
Continue aspirin, beta blocker, ACE inhibitor and a statin.

## 2010-09-27 NOTE — Assessment & Plan Note (Signed)
Continue statin. We'll check lipids and liver. 

## 2010-09-27 NOTE — Assessment & Plan Note (Signed)
Right carotid bruit. Scheduled Dopplers.

## 2010-09-27 NOTE — Assessment & Plan Note (Signed)
Patient's blood pressure mildly elevated. However he states normal at home. Follow and increase medications as needed.

## 2010-09-27 NOTE — Assessment & Plan Note (Signed)
Patient scheduled for left ankle surgery. Recent Myoview low risk. Continue present medications pre-and postoperatively. No further workup prior to surgery.

## 2010-09-27 NOTE — Patient Instructions (Signed)
Your physician wants you to follow-up in: ONE YEAR WITH DR Shelda Pal will receive a reminder letter in the mail two months in advance. If you don't receive a letter, please call our office to schedule the follow-up appointment.   Your physician has requested that you have a carotid duplex. This test is an ultrasound of the carotid arteries in your neck. It looks at blood flow through these arteries that supply the brain with blood. Allow one hour for this exam. There are no restrictions or special instructions.   Your physician recommends that you return for lab work in: TODAY
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
## 2010-09-28 ENCOUNTER — Encounter: Payer: Self-pay | Admitting: *Deleted

## 2010-10-13 ENCOUNTER — Encounter (INDEPENDENT_AMBULATORY_CARE_PROVIDER_SITE_OTHER): Payer: Medicare Other | Admitting: Cardiology

## 2010-10-13 DIAGNOSIS — I6529 Occlusion and stenosis of unspecified carotid artery: Secondary | ICD-10-CM

## 2010-10-13 DIAGNOSIS — R0989 Other specified symptoms and signs involving the circulatory and respiratory systems: Secondary | ICD-10-CM

## 2010-10-18 ENCOUNTER — Encounter: Payer: Self-pay | Admitting: Cardiology

## 2010-12-01 ENCOUNTER — Ambulatory Visit (HOSPITAL_COMMUNITY)
Admission: RE | Admit: 2010-12-01 | Discharge: 2010-12-01 | Disposition: A | Discharge status: Home / Self Care | Payer: Medicare Other | Source: Ambulatory Visit | Attending: Orthopedic Surgery | Admitting: Orthopedic Surgery

## 2010-12-01 DIAGNOSIS — M79609 Pain in unspecified limb: Secondary | ICD-10-CM | POA: Insufficient documentation

## 2011-01-06 ENCOUNTER — Ambulatory Visit (INDEPENDENT_AMBULATORY_CARE_PROVIDER_SITE_OTHER): Payer: Medicare Other | Admitting: Internal Medicine

## 2011-01-06 ENCOUNTER — Other Ambulatory Visit (INDEPENDENT_AMBULATORY_CARE_PROVIDER_SITE_OTHER): Payer: Medicare Other

## 2011-01-06 ENCOUNTER — Encounter: Payer: Self-pay | Admitting: Internal Medicine

## 2011-01-06 VITALS — BP 118/70 | HR 52 | Temp 98.1°F | Ht 69.0 in | Wt 286.0 lb

## 2011-01-06 DIAGNOSIS — R7309 Other abnormal glucose: Secondary | ICD-10-CM

## 2011-01-06 DIAGNOSIS — Z0000 Encounter for general adult medical examination without abnormal findings: Secondary | ICD-10-CM

## 2011-01-06 DIAGNOSIS — Z79899 Other long term (current) drug therapy: Secondary | ICD-10-CM

## 2011-01-06 DIAGNOSIS — Z125 Encounter for screening for malignant neoplasm of prostate: Secondary | ICD-10-CM

## 2011-01-06 DIAGNOSIS — R7302 Impaired glucose tolerance (oral): Secondary | ICD-10-CM

## 2011-01-06 DIAGNOSIS — Z23 Encounter for immunization: Secondary | ICD-10-CM

## 2011-01-06 HISTORY — DX: Impaired glucose tolerance (oral): R73.02

## 2011-01-06 LAB — CBC WITH DIFFERENTIAL/PLATELET
Basophils Relative: 0.4 % (ref 0.0–3.0)
Eosinophils Relative: 3.8 % (ref 0.0–5.0)
Hemoglobin: 13.2 g/dL (ref 13.0–17.0)
MCHC: 34.1 g/dL (ref 30.0–36.0)
MCV: 93.1 fl (ref 78.0–100.0)
Monocytes Absolute: 0.5 10*3/uL (ref 0.1–1.0)
Neutro Abs: 5.3 10*3/uL (ref 1.4–7.7)
Neutrophils Relative %: 65.7 % (ref 43.0–77.0)
RBC: 4.16 Mil/uL — ABNORMAL LOW (ref 4.22–5.81)
WBC: 8 10*3/uL (ref 4.5–10.5)

## 2011-01-06 LAB — URINALYSIS, ROUTINE W REFLEX MICROSCOPIC
Bilirubin Urine: NEGATIVE
Nitrite: NEGATIVE
Total Protein, Urine: NEGATIVE
Urine Glucose: NEGATIVE
pH: 7 (ref 5.0–8.0)

## 2011-01-06 LAB — HEPATIC FUNCTION PANEL: ALT: 20 U/L (ref 0–53)

## 2011-01-06 LAB — PSA: PSA: 0.37 ng/mL (ref 0.10–4.00)

## 2011-01-06 LAB — BASIC METABOLIC PANEL
CO2: 29 mEq/L (ref 19–32)
Glucose, Bld: 92 mg/dL (ref 70–99)
Potassium: 4.5 mEq/L (ref 3.5–5.1)
Sodium: 138 mEq/L (ref 135–145)

## 2011-01-06 MED ORDER — GABAPENTIN 300 MG PO CAPS: 300.0000 mg | ORAL_CAPSULE | Freq: Two times a day (BID) | ORAL | Status: DC

## 2011-01-06 MED ORDER — POTASSIUM CHLORIDE CRYS ER 20 MEQ PO TBCR: 20.0000 meq | EXTENDED_RELEASE_TABLET | Freq: Two times a day (BID) | ORAL | Status: DC

## 2011-01-06 MED ORDER — TAMSULOSIN HCL 0.4 MG PO CAPS: 0.4000 mg | ORAL_CAPSULE | Freq: Every day | ORAL | Status: DC

## 2011-01-06 MED ORDER — MELOXICAM 15 MG PO TABS: 15.0000 mg | ORAL_TABLET | Freq: Every day | ORAL | Status: DC

## 2011-01-06 NOTE — Assessment & Plan Note (Signed)
asympt - for a1c 

## 2011-01-06 NOTE — Patient Instructions (Addendum)
You had the flu shot today Your refills are done today as requested Continue all other medications as before Please go to LAB in the Basement for the blood and/or urine tests to be done today Please call the phone number [phone] (the PhoneTree System) for results of testing in 2-3 days;  When calling, simply dial the number, and when prompted enter the MRN number above (the Medical Record Number) and the # key, then the message should start. Please return in 6 mo with Lab testing done 3-5 days before
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
## 2011-01-06 NOTE — Progress Notes (Signed)
Quick Note:  Voice message left on [employer] system - lab is negative, normal or otherwise stable, pt to continue same tx ______ 

## 2011-01-06 NOTE — Assessment & Plan Note (Signed)

## 2011-01-08 IMAGING — CR DG CHEST 1V PORT
1 series · 1 of 1 positions shown · non-contrast
Comparison: 03/30/2006

CLINICAL DATA: 64-year-old with chest pain.

PORTABLE CHEST - 1 VIEW

[AP]
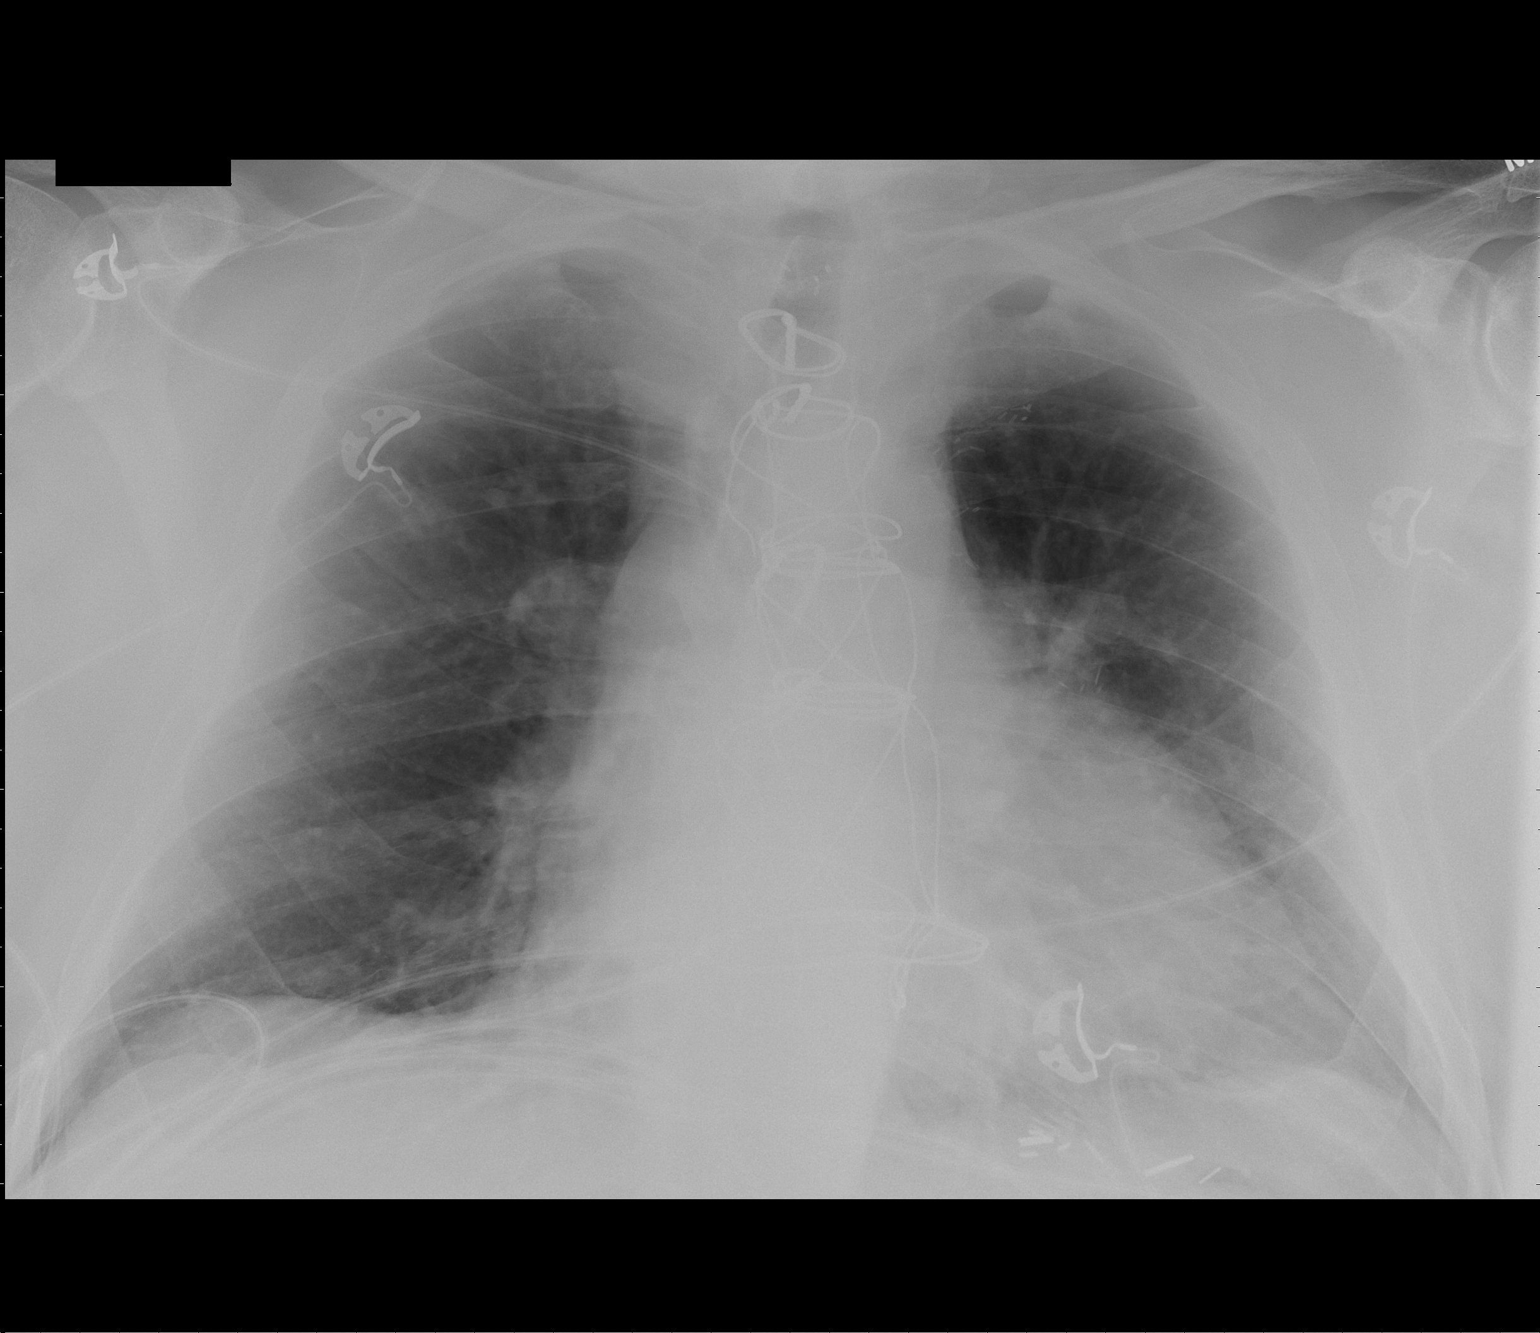

[1 of 1 positions shown; findings below may reference images not displayed]

FINDINGS: Portable semi upright view of the chest demonstrates
median sternotomy wires.  Heart size is upper limits of normal.
Lungs are clear without focal airspace disease or edema.  Osseous
structures are intact.
IMPRESSION: Stable postoperative changes without acute findings.

## 2011-02-02 ENCOUNTER — Ambulatory Visit (INDEPENDENT_AMBULATORY_CARE_PROVIDER_SITE_OTHER): Payer: Medicare Other | Admitting: Internal Medicine

## 2011-02-02 ENCOUNTER — Encounter: Payer: Self-pay | Admitting: Internal Medicine

## 2011-02-02 VITALS — BP 118/68 | HR 56 | Temp 98.7°F | Ht 70.0 in | Wt 289.2 lb

## 2011-02-02 DIAGNOSIS — J309 Allergic rhinitis, unspecified: Secondary | ICD-10-CM

## 2011-02-02 DIAGNOSIS — J019 Acute sinusitis, unspecified: Secondary | ICD-10-CM

## 2011-02-02 DIAGNOSIS — R609 Edema, unspecified: Secondary | ICD-10-CM | POA: Insufficient documentation

## 2011-02-02 DIAGNOSIS — R42 Dizziness and giddiness: Secondary | ICD-10-CM

## 2011-02-02 HISTORY — DX: Edema, unspecified: R60.9

## 2011-02-02 MED ORDER — METHYLPREDNISOLONE ACETATE PF 80 MG/ML IJ SUSP
120.0000 mg | Freq: Once | INTRAMUSCULAR | Status: AC
  Administered 2011-02-02: 120 mg via INTRAMUSCULAR

## 2011-02-02 MED ORDER — AZITHROMYCIN 250 MG PO TABS: ORAL_TABLET | ORAL | Status: AC

## 2011-02-02 MED ORDER — MECLIZINE HCL 12.5 MG PO TABS: 12.5000 mg | ORAL_TABLET | Freq: Three times a day (TID) | ORAL | Status: AC | PRN

## 2011-02-02 MED ORDER — PREDNISONE 10 MG PO TABS: ORAL_TABLET | ORAL | Status: DC

## 2011-02-02 NOTE — Patient Instructions (Addendum)
You had the steroid shot today Take all new medications as prescribed - the prednisone short course, antibiotic, and meclizine for vertigo as needed as well You can also take OTC mucinex and allegra as well for congestion and allergies You should avoid decongestant such as sudafed due to blood pressure

## 2011-02-02 NOTE — Assessment & Plan Note (Signed)
Mild to mod, for allegra otc prn, as well as depomedrol IM and predpack today course,  to f/u any worsening symptoms or concerns, all c/w seasonal flare

## 2011-02-02 NOTE — Assessment & Plan Note (Signed)
Mild to mod, for antibx course,  to f/u any worsening symptoms or concerns 

## 2011-02-06 ENCOUNTER — Encounter: Payer: Self-pay | Admitting: Internal Medicine

## 2011-02-06 NOTE — Assessment & Plan Note (Signed)
Mild,due to sinus and eustachaian valve, for meclizine prn,  to f/u any worsening symptoms or concerns, and mucinex otc prn

## 2011-02-06 NOTE — Progress Notes (Deleted)
  Subjective:    Patient ID: Steven Charles, male    DOB: March 08, 1946, 65 y.o.   MRN: [id number]  HPI    Review of Systems     Objective:   Physical Exam        Assessment & Plan:
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
## 2011-03-07 ENCOUNTER — Other Ambulatory Visit: Payer: Self-pay

## 2011-03-07 MED ORDER — LORAZEPAM 1 MG PO TABS: 1.0000 mg | ORAL_TABLET | Freq: Three times a day (TID) | ORAL | Status: DC | PRN

## 2011-03-07 NOTE — Telephone Encounter (Signed)
Called the patient left message to call back, also did fax prescription requested to pharmacy

## 2011-03-07 NOTE — Telephone Encounter (Signed)
Patient requesting refill on Lorazepam. Also if we have would like samples of Zetia and Crestor.

## 2011-03-07 NOTE — Telephone Encounter (Signed)
Ok for samples if we have them  Lorazepam Done hardcopy to [facility]

## 2011-03-08 NOTE — Telephone Encounter (Signed)
Patient informed prescription sent in and samples are in the closet at front desk. 4 boxes of Crestor #7 20 mg, and 3 boxes Zetia 10 mg #7 each.

## 2011-03-29 HISTORY — PX: ESOPHAGOGASTRODUODENOSCOPY: SHX1529

## 2011-04-05 ENCOUNTER — Telehealth: Payer: Self-pay | Admitting: Cardiology

## 2011-04-05 NOTE — Telephone Encounter (Signed)
Spoke with pt, his insurance has changed and his lipitor is too expensive. He would like to change to pravachol. He is currently taking lipitor 10 mg. Will forward for dr Jens Som review

## 2011-04-05 NOTE — Telephone Encounter (Signed)
Dc lipitor; pravachol 40 mg daily; lipids and liver in six weeks Steven Charles

## 2011-04-05 NOTE — Telephone Encounter (Signed)
FU Call: Pt returning call from our office from yesterday. Please return pt call to discuss further.  

## 2011-04-06 ENCOUNTER — Encounter: Payer: Self-pay | Admitting: Gastroenterology

## 2011-04-06 MED ORDER — PRAVASTATIN SODIUM 40 MG PO TABS: 40.0000 mg | ORAL_TABLET | Freq: Every day | ORAL | Status: DC

## 2011-04-06 NOTE — Telephone Encounter (Signed)
Spoke with pt, he just got the lipitor filled. Script for pravachol mailed to pt home address. He knows to contact me when he starts the pravachol so lab work can be arranged.

## 2011-04-19 ENCOUNTER — Encounter: Payer: Self-pay | Admitting: Gastroenterology

## 2011-04-19 ENCOUNTER — Ambulatory Visit (AMBULATORY_SURGERY_CENTER): Payer: MEDICARE

## 2011-04-19 VITALS — Ht 69.0 in | Wt 297.2 lb

## 2011-04-19 DIAGNOSIS — K2270 Barrett's esophagus without dysplasia: Secondary | ICD-10-CM

## 2011-05-03 ENCOUNTER — Encounter: Payer: Self-pay | Admitting: Gastroenterology

## 2011-05-03 ENCOUNTER — Ambulatory Visit (AMBULATORY_SURGERY_CENTER): Payer: MEDICARE | Admitting: Gastroenterology

## 2011-05-03 VITALS — BP 134/59 | HR 53 | Temp 96.1°F | Resp 12

## 2011-05-03 DIAGNOSIS — K219 Gastro-esophageal reflux disease without esophagitis: Secondary | ICD-10-CM

## 2011-05-03 DIAGNOSIS — K2270 Barrett's esophagus without dysplasia: Secondary | ICD-10-CM

## 2011-05-03 DIAGNOSIS — Z8719 Personal history of other diseases of the digestive system: Secondary | ICD-10-CM

## 2011-05-03 MED ORDER — SODIUM CHLORIDE 0.9 % IV SOLN: 500.0000 mL | INTRAVENOUS | Status: DC

## 2011-05-03 NOTE — Progress Notes (Signed)
Patient did not have preoperative order for IV antibiotic SSI prophylaxis. (G8918)  Patient did not experience any of the following events: a burn prior to discharge; a fall within the facility; wrong site/side/patient/procedure/implant event; or a hospital transfer or hospital admission upon discharge from the facility. (G8907)  

## 2011-05-03 NOTE — Patient Instructions (Signed)
Read the handouts given to you by your recovery room nurse.  Call us if you have any questions at [phone].  Thank-you.

## 2011-05-03 NOTE — Op Note (Signed)
[facility] [address] [place], [place]  [address]  ENDOSCOPY PROCEDURE REPORT  PATIENT:  Zandon, Talton  MR#:  [id number] BIRTHDATE:  06/11/1945, 65 yrs. old  GENDER:  male  ENDOSCOPIST:  Vania Rea. Jarold Motto, MD, [facility] Referred by:  PROCEDURE DATE:  05/03/2011 PROCEDURE:  EGD with biopsy, 43239, EGD with biopsy for H. pylori 41324 ASA CLASS:  Class III INDICATIONS:  h/o Barrett's Esophagus  MEDICATIONS:   propofol (Diprivan) 150 mg IV TOPICAL ANESTHETIC:  DESCRIPTION OF PROCEDURE:   After the risks and benefits of the procedure were explained, informed consent was obtained.  The LB [id number] endoscope was introduced through the mouth and advanced to the second portion of the duodenum.  The instrument was slowly withdrawn as the mucosa was fully examined. <<PROCEDUREIMAGES>>  ENDOSCOPIC FINDINGS:   Barrett's esophagus was found in the distal esophagus. 0 CM BARRETT'S SEGMENT BIOPSIED.2 JARS PROXIMAL AND DISTAL DONE.  Moderate gastritis was found. EROSIVE ANTRAL GASTRITIS,SEE PICTURES.CLO BX. DONE.  A hiatal hernia was found. PROMINANT SLIDING HH NOTED.    Retroflexed views revealed a hiatal hernia.    The scope was then withdrawn from the patient and the procedure completed.  COMPLICATIONS:  None  ENDOSCOPIC IMPRESSION: 1) Moderate gastritis 2) Hiatal hernia 3) Barrett's esophagus in the distal esophagus 1.CHRONIC GERD 2.BARRETT'S MUCOSA 3.R/O DYSPLASIA RECOMMENDATIONS: 1) Await biopsy results 2) Rx CLO if positive 3) continue PPI  ______________________________ Vania Rea. Jarold Motto, MD, Clementeen Graham  CC:  Corwin Levins, MD, Duke Salvia, MD  n. Rosalie Doctor:   Vania Rea. Malanie Koloski at 05/03/2011 10:37 AM  Waunita Schooner, [phone]

## 2011-05-04 ENCOUNTER — Encounter: Payer: Self-pay | Admitting: Gastroenterology

## 2011-05-04 ENCOUNTER — Telehealth: Payer: Self-pay

## 2011-05-04 LAB — HELICOBACTER PYLORI SCREEN-BIOPSY: UREASE: NEGATIVE

## 2011-05-04 NOTE — Telephone Encounter (Signed)
  Follow up Call-  Call back number 05/03/2011  Post procedure Call Back phone  # [phone]  Permission to leave phone message Yes     Patient questions:  Do you have a fever, pain , or abdominal swelling? no Pain Score  0 *  Have you tolerated food without any problems? yes  Have you been able to return to your normal activities? yes  Do you have any questions about your discharge instructions: Diet   no Medications  no Follow up visit  no  Do you have questions or concerns about your Care? no  Actions: * If pain score is 4 or above: No action needed, pain <4.

## 2011-05-09 ENCOUNTER — Telehealth: Payer: Self-pay | Admitting: *Deleted

## 2011-05-09 ENCOUNTER — Encounter: Payer: Self-pay | Admitting: Gastroenterology

## 2011-05-09 MED ORDER — ESOMEPRAZOLE MAGNESIUM 40 MG PO CPDR: 40.0000 mg | DELAYED_RELEASE_CAPSULE | Freq: Two times a day (BID) | ORAL | Status: DC

## 2011-05-09 NOTE — Telephone Encounter (Signed)
Message copied by Leonette Monarch on Mon May 09, 2011 12:19 PM ------      Message from: Jarold Motto, DAVID R      Created: Mon May 09, 2011 12:08 PM       Double ppi dose and repeat exam 1 year.Marland KitchenMarland Kitchen

## 2011-05-09 NOTE — Telephone Encounter (Signed)
Bid nexium.Marland KitchenMarland KitchenMarland Kitchen

## 2011-05-09 NOTE — Telephone Encounter (Signed)
Left message rx sent

## 2011-05-09 NOTE — Telephone Encounter (Signed)
pts wife said that the prilosec is not working well and that after the procedure Dr Jarold Motto had mentioned switching back to Nexium since it worked better in the past if he switches back to Nexium do you want him to do Nexium BID or QD?

## 2011-07-07 ENCOUNTER — Encounter: Payer: Self-pay | Admitting: Internal Medicine

## 2011-07-07 ENCOUNTER — Ambulatory Visit (INDEPENDENT_AMBULATORY_CARE_PROVIDER_SITE_OTHER): Payer: MEDICARE | Admitting: Internal Medicine

## 2011-07-07 VITALS — BP 108/60 | HR 69 | Temp 97.8°F | Ht 69.0 in | Wt 291.4 lb

## 2011-07-07 DIAGNOSIS — K219 Gastro-esophageal reflux disease without esophagitis: Secondary | ICD-10-CM

## 2011-07-07 DIAGNOSIS — E785 Hyperlipidemia, unspecified: Secondary | ICD-10-CM

## 2011-07-07 DIAGNOSIS — I10 Essential (primary) hypertension: Secondary | ICD-10-CM

## 2011-07-07 DIAGNOSIS — L259 Unspecified contact dermatitis, unspecified cause: Secondary | ICD-10-CM

## 2011-07-07 DIAGNOSIS — R7309 Other abnormal glucose: Secondary | ICD-10-CM

## 2011-07-07 DIAGNOSIS — L309 Dermatitis, unspecified: Secondary | ICD-10-CM

## 2011-07-07 DIAGNOSIS — R7302 Impaired glucose tolerance (oral): Secondary | ICD-10-CM

## 2011-07-07 DIAGNOSIS — Z0000 Encounter for general adult medical examination without abnormal findings: Secondary | ICD-10-CM

## 2011-07-07 MED ORDER — PANTOPRAZOLE SODIUM 40 MG PO TBEC: 40.0000 mg | DELAYED_RELEASE_TABLET | Freq: Two times a day (BID) | ORAL | Status: DC

## 2011-07-07 NOTE — Patient Instructions (Signed)
Ok to stop the nexium, and start the generic protonix 40 mg twice per day OK to re-start the steroid cream you have  - Kenalog .1 % cream Continue all other medications as before Please have the pharmacy call with any refills you may need. No need for further lab work today Please return in 6 mo with Lab testing done 3-5 days before

## 2011-07-09 ENCOUNTER — Encounter: Payer: Self-pay | Admitting: Internal Medicine

## 2011-07-09 NOTE — Assessment & Plan Note (Signed)
stable overall by hx and exam, most recent data reviewed with pt, and pt to continue medical treatment as before  Lab Results  Component Value Date   LDLCALC 68 01/06/2011
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
## 2011-07-09 NOTE — Assessment & Plan Note (Signed)
nexium too expensive, for change to protonix bid,  to f/u any worsening symptoms or concerns

## 2011-07-09 NOTE — Assessment & Plan Note (Signed)
stable overall by hx and exam, most recent data reviewed with pt, and pt to continue medical treatment as before  Lab Results  Component Value Date   HGBA1C 5.6 12/12/2006

## 2011-07-09 NOTE — Assessment & Plan Note (Signed)
To re-start the triam cr prn,  to f/u any worsening symptoms or concerns

## 2011-07-09 NOTE — Assessment & Plan Note (Signed)
stable overall by hx and exam, most recent data reviewed with pt, and pt to continue medical treatment as before  BP Readings from Last 3 Encounters:  07/07/11 108/60  05/03/11 134/59  02/02/11 118/68

## 2011-07-15 ENCOUNTER — Other Ambulatory Visit: Payer: Self-pay | Admitting: Internal Medicine

## 2011-07-25 ENCOUNTER — Other Ambulatory Visit: Payer: Self-pay | Admitting: Internal Medicine

## 2011-08-04 ENCOUNTER — Encounter: Payer: Self-pay | Admitting: Internal Medicine

## 2011-08-04 ENCOUNTER — Ambulatory Visit (INDEPENDENT_AMBULATORY_CARE_PROVIDER_SITE_OTHER): Payer: MEDICARE | Admitting: Internal Medicine

## 2011-08-04 VITALS — BP 122/60 | HR 61 | Temp 99.0°F | Ht 69.5 in | Wt 296.0 lb

## 2011-08-04 DIAGNOSIS — F411 Generalized anxiety disorder: Secondary | ICD-10-CM

## 2011-08-04 DIAGNOSIS — N529 Male erectile dysfunction, unspecified: Secondary | ICD-10-CM

## 2011-08-04 DIAGNOSIS — N501 Vascular disorders of male genital organs: Secondary | ICD-10-CM

## 2011-08-04 DIAGNOSIS — I10 Essential (primary) hypertension: Secondary | ICD-10-CM

## 2011-08-04 MED ORDER — VARDENAFIL HCL 20 MG PO TABS: 20.0000 mg | ORAL_TABLET | ORAL | Status: DC | PRN

## 2011-08-04 NOTE — Patient Instructions (Addendum)
Take all new medications as prescribed Continue all other medications as before  

## 2011-08-05 ENCOUNTER — Other Ambulatory Visit: Payer: Self-pay | Admitting: Internal Medicine

## 2011-08-07 ENCOUNTER — Encounter: Payer: Self-pay | Admitting: Internal Medicine

## 2011-08-07 DIAGNOSIS — N529 Male erectile dysfunction, unspecified: Secondary | ICD-10-CM

## 2011-08-07 DIAGNOSIS — N501 Vascular disorders of male genital organs: Secondary | ICD-10-CM | POA: Insufficient documentation

## 2011-08-07 HISTORY — DX: Male erectile dysfunction, unspecified: N52.9

## 2011-08-07 NOTE — Assessment & Plan Note (Signed)
stable overall by hx and exam, most recent data reviewed with pt, and pt to continue medical treatment as before BP Readings from Last 3 Encounters:  08/04/11 122/60  07/07/11 108/60  05/03/11 134/59

## 2011-08-07 NOTE — Assessment & Plan Note (Signed)
Minor, exam benign, likely venous bleed, ok to cont the asa 325,  to f/u any worsening symptoms or concerns

## 2011-08-07 NOTE — Assessment & Plan Note (Signed)
stable overall by hx and exam, most recent data reviewed with pt, and pt to continue medical treatment as before Lab Results  Component Value Date   WBC 8.0 01/06/2011   HGB 13.2 01/06/2011   HCT 38.8* 01/06/2011   PLT 155.0 01/06/2011   GLUCOSE 92 01/06/2011   CHOL 136 01/06/2011   TRIG 176.0* 01/06/2011   HDL 33.10* 01/06/2011   LDLDIRECT 73.1 07/05/2007   LDLCALC 68 01/06/2011   ALT 20 01/06/2011   AST 20 01/06/2011   NA 138 01/06/2011   K 4.5 01/06/2011   CL 102 01/06/2011   CREATININE 1.3 01/06/2011   BUN 20 01/06/2011   CO2 29 01/06/2011   TSH 3.35 01/06/2011   PSA 0.37 01/06/2011   INR 0.94 02/07/2010   HGBA1C 5.6 12/12/2006   MICROALBUR 0.3 12/12/2006
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
## 2011-08-07 NOTE — Assessment & Plan Note (Signed)
Ok for levitra prn  to f/u any worsening symptoms or concerns

## 2011-08-11 ENCOUNTER — Other Ambulatory Visit: Payer: Self-pay | Admitting: Internal Medicine

## 2011-09-26 ENCOUNTER — Other Ambulatory Visit: Payer: Self-pay | Admitting: Internal Medicine

## 2011-10-03 ENCOUNTER — Other Ambulatory Visit: Payer: Self-pay | Admitting: Internal Medicine

## 2011-10-03 NOTE — Telephone Encounter (Signed)
Faxed hardcopy to pharmacy. 

## 2011-10-03 NOTE — Telephone Encounter (Signed)
Done hardcopy to robin  

## 2011-10-10 ENCOUNTER — Other Ambulatory Visit (INDEPENDENT_AMBULATORY_CARE_PROVIDER_SITE_OTHER): Payer: MEDICARE

## 2011-10-10 ENCOUNTER — Other Ambulatory Visit: Payer: Self-pay | Admitting: *Deleted

## 2011-10-10 DIAGNOSIS — E7800 Pure hypercholesterolemia, unspecified: Secondary | ICD-10-CM

## 2011-10-10 LAB — BASIC METABOLIC PANEL
BUN: 18 mg/dL (ref 6–23)
CO2: 27 mEq/L (ref 19–32)
Chloride: 104 mEq/L (ref 96–112)
Creatinine, Ser: 1.3 mg/dL (ref 0.4–1.5)
Potassium: 4.3 mEq/L (ref 3.5–5.1)

## 2011-10-10 LAB — LIPID PANEL
LDL Cholesterol: 76 mg/dL (ref 0–99)
Total CHOL/HDL Ratio: 5

## 2011-10-10 LAB — HEPATIC FUNCTION PANEL
Albumin: 3.5 g/dL (ref 3.5–5.2)
Alkaline Phosphatase: 74 U/L (ref 39–117)
Bilirubin, Direct: 0.1 mg/dL (ref 0.0–0.3)

## 2011-10-13 ENCOUNTER — Other Ambulatory Visit: Payer: Self-pay | Admitting: Cardiology

## 2011-10-13 DIAGNOSIS — I6529 Occlusion and stenosis of unspecified carotid artery: Secondary | ICD-10-CM

## 2011-10-17 ENCOUNTER — Ambulatory Visit (INDEPENDENT_AMBULATORY_CARE_PROVIDER_SITE_OTHER): Payer: MEDICARE | Admitting: Cardiology

## 2011-10-17 ENCOUNTER — Encounter (INDEPENDENT_AMBULATORY_CARE_PROVIDER_SITE_OTHER): Payer: MEDICARE

## 2011-10-17 ENCOUNTER — Encounter: Payer: Self-pay | Admitting: Cardiology

## 2011-10-17 VITALS — BP 137/75 | HR 57 | Ht 69.0 in | Wt 288.0 lb

## 2011-10-17 DIAGNOSIS — R5383 Other fatigue: Secondary | ICD-10-CM

## 2011-10-17 DIAGNOSIS — I714 Abdominal aortic aneurysm, without rupture, unspecified: Secondary | ICD-10-CM

## 2011-10-17 DIAGNOSIS — I6529 Occlusion and stenosis of unspecified carotid artery: Secondary | ICD-10-CM

## 2011-10-17 DIAGNOSIS — I2510 Atherosclerotic heart disease of native coronary artery without angina pectoris: Secondary | ICD-10-CM

## 2011-10-17 DIAGNOSIS — E785 Hyperlipidemia, unspecified: Secondary | ICD-10-CM

## 2011-10-17 DIAGNOSIS — I10 Essential (primary) hypertension: Secondary | ICD-10-CM

## 2011-10-17 DIAGNOSIS — I679 Cerebrovascular disease, unspecified: Secondary | ICD-10-CM | POA: Insufficient documentation

## 2011-10-17 DIAGNOSIS — R0989 Other specified symptoms and signs involving the circulatory and respiratory systems: Secondary | ICD-10-CM

## 2011-10-17 DIAGNOSIS — R5381 Other malaise: Secondary | ICD-10-CM

## 2011-10-17 LAB — CBC WITH DIFFERENTIAL/PLATELET
Basophils Relative: 0.2 % (ref 0.0–3.0)
Eosinophils Absolute: 0.3 10*3/uL (ref 0.0–0.7)
HCT: 34.4 % — ABNORMAL LOW (ref 39.0–52.0)
Lymphs Abs: 1.5 10*3/uL (ref 0.7–4.0)
MCHC: 32.7 g/dL (ref 30.0–36.0)
MCV: 89.8 fl (ref 78.0–100.0)
Monocytes Absolute: 0.5 10*3/uL (ref 0.1–1.0)
Neutro Abs: 4.8 10*3/uL (ref 1.4–7.7)
Neutrophils Relative %: 66.9 % (ref 43.0–77.0)
RBC: 3.83 Mil/uL — ABNORMAL LOW (ref 4.22–5.81)

## 2011-10-17 NOTE — Assessment & Plan Note (Signed)
Last CT showed no aneurysm. I will most likely repeat his ultrasound in one year.

## 2011-10-17 NOTE — Assessment & Plan Note (Signed)
Continue aspirin and statin. Patient is for followup carotid Dopplers today.

## 2011-10-17 NOTE — Assessment & Plan Note (Signed)
Continue aspirin and statin. 

## 2011-10-17 NOTE — Assessment & Plan Note (Signed)
Blood pressure controlled. Continue present medications. He is describing fatigue. However his beta blocker has been long-term. Check TSH and CBC.

## 2011-10-17 NOTE — Assessment & Plan Note (Signed)
Continue Crestor but discontinue Pravachol.

## 2011-10-17 NOTE — Patient Instructions (Signed)
Your physician wants you to follow-up in:  YEAR WITH DR  Steven Charles will receive a reminder letter in the mail two months in advance. If you don't receive a letter, please call our office to schedule the follow-up appointment. Your physician has recommended you make the following change in your medication: STOP PRAVASTATIN Your physician recommends that you return for lab work in: TODAY   CBC TSH  DX FATIGUE
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
## 2011-10-19 ENCOUNTER — Encounter: Payer: Self-pay | Admitting: Internal Medicine

## 2011-10-19 ENCOUNTER — Ambulatory Visit (INDEPENDENT_AMBULATORY_CARE_PROVIDER_SITE_OTHER): Payer: MEDICARE | Admitting: Internal Medicine

## 2011-10-19 VITALS — BP 126/68 | HR 61 | Temp 97.4°F | Ht 69.5 in | Wt 288.4 lb

## 2011-10-19 DIAGNOSIS — F411 Generalized anxiety disorder: Secondary | ICD-10-CM

## 2011-10-19 DIAGNOSIS — M25511 Pain in right shoulder: Secondary | ICD-10-CM | POA: Insufficient documentation

## 2011-10-19 DIAGNOSIS — M25519 Pain in unspecified shoulder: Secondary | ICD-10-CM

## 2011-10-19 DIAGNOSIS — I10 Essential (primary) hypertension: Secondary | ICD-10-CM

## 2011-10-19 NOTE — Assessment & Plan Note (Signed)
1 mo mod to severe persistent, with decreased ROM, palpable anterior tenderness, I suspect partial or full tear of rotater cuff - for MRI, and refer ortho, cont current pain meds

## 2011-10-19 NOTE — Patient Instructions (Addendum)
Continue all other medications as before You will be contacted regarding the referral for: MRI, and Dr Duda/orthopedic

## 2011-10-22 ENCOUNTER — Encounter: Payer: Self-pay | Admitting: Internal Medicine

## 2011-10-22 NOTE — Assessment & Plan Note (Signed)
stable overall by hx and exam, most recent data reviewed with pt, and pt to continue medical treatment as before BP Readings from Last 3 Encounters:  10/19/11 126/68  10/17/11 137/75  08/04/11 122/60

## 2011-10-22 NOTE — Assessment & Plan Note (Signed)
stable overall by hx and exam, most recent data reviewed with pt, and pt to continue medical treatment as before Lab Results  Component Value Date   WBC 7.2 10/17/2011   HGB 11.2* 10/17/2011   HCT 34.4* 10/17/2011   PLT 168.0 10/17/2011   GLUCOSE 98 10/10/2011   CHOL 146 10/10/2011   TRIG 197.0* 10/10/2011   HDL 30.60* 10/10/2011   LDLDIRECT 73.1 07/05/2007   LDLCALC 76 10/10/2011   ALT 12 10/10/2011   AST 16 10/10/2011   NA 138 10/10/2011   K 4.3 10/10/2011   CL 104 10/10/2011   CREATININE 1.3 10/10/2011   BUN 18 10/10/2011   CO2 27 10/10/2011   TSH 4.62 10/17/2011   PSA 0.37 01/06/2011   INR 0.94 02/07/2010   HGBA1C 5.6 12/12/2006   MICROALBUR 0.3 12/12/2006
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
## 2011-10-24 ENCOUNTER — Other Ambulatory Visit (INDEPENDENT_AMBULATORY_CARE_PROVIDER_SITE_OTHER): Payer: MEDICARE

## 2011-10-24 ENCOUNTER — Other Ambulatory Visit: Payer: MEDICARE

## 2011-10-24 ENCOUNTER — Telehealth: Payer: Self-pay | Admitting: Internal Medicine

## 2011-10-24 DIAGNOSIS — D649 Anemia, unspecified: Secondary | ICD-10-CM

## 2011-10-24 LAB — RETICULOCYTES
RBC.: 3.91 MIL/uL — ABNORMAL LOW (ref 4.22–5.81)
Retic Ct Pct: 1.7 % (ref 0.4–2.3)

## 2011-10-24 LAB — CBC WITH DIFFERENTIAL/PLATELET
Basophils Relative: 0.5 % (ref 0.0–3.0)
Eosinophils Absolute: 0.2 10*3/uL (ref 0.0–0.7)
Eosinophils Relative: 3.9 % (ref 0.0–5.0)
Lymphocytes Relative: 25.3 % (ref 12.0–46.0)
Monocytes Absolute: 0.4 10*3/uL (ref 0.1–1.0)
Neutrophils Relative %: 63.7 % (ref 43.0–77.0)
Platelets: 169 10*3/uL (ref 150.0–400.0)
RBC: 3.8 Mil/uL — ABNORMAL LOW (ref 4.22–5.81)
WBC: 6.5 10*3/uL (ref 4.5–10.5)

## 2011-10-24 LAB — IRON: Iron: 62 ug/dL (ref 42–165)

## 2011-10-24 NOTE — Telephone Encounter (Signed)
Pt just seen July 24;  Lab noted - ? Spurious vs new normocytic anemia  Robin to contact pt - needs repeat cbc, b12/folate/iron/retic/haptoglobin/LDH; and ask if has noted any recent overt bleeding or bruising such as BRBPR  Code 285.9 - robin to add labs

## 2011-10-24 NOTE — Telephone Encounter (Signed)
Message copied by Corwin Levins on Mon Oct 24, 2011 12:19 PM ------      Message from: MATHIS, DEBRA W      Created: Mon Oct 24, 2011 11:59 AM       pt aware of results, forwarded to dr Jonny Ruiz for his review.

## 2011-10-24 NOTE — Telephone Encounter (Signed)
Called the patient informed to return to the lab, put order in.  The patient has not noticed any bruising or bleeding at this time.  Stated his left hand does bruise easily if he hits something, but has always done that.

## 2011-10-25 ENCOUNTER — Encounter: Payer: Self-pay | Admitting: Internal Medicine

## 2011-10-26 LAB — LACTATE DEHYDROGENASE, ISOENZYMES
LDH 1: 19 % (ref 19–38)
LDH 5: 12 % (ref 3–14)
LDH Isoenzymes, Total: 140 U/L (ref 120–250)

## 2011-11-09 ENCOUNTER — Telehealth: Payer: Self-pay | Admitting: Internal Medicine

## 2011-11-09 NOTE — Telephone Encounter (Signed)
Forward  2 pages from [facility] to Dr. Oliver Barre for review on 11-09-11 ym

## 2011-11-29 ENCOUNTER — Telehealth: Payer: Self-pay | Admitting: Cardiology

## 2011-11-29 NOTE — Telephone Encounter (Signed)
Discussed with dr Jens Som, sounds like vertigo. The pt is going to take some sudafed to see if that helps with his symptoms. If not he will give dr Jonny Ruiz a call.

## 2011-11-29 NOTE — Telephone Encounter (Signed)
plz return call to patient [phone]  Pt c/o of being lightheaded after medication hold, he would like to discuss weakness and medical care.
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
## 2011-12-06 ENCOUNTER — Other Ambulatory Visit: Payer: Self-pay | Admitting: Internal Medicine

## 2011-12-06 NOTE — Telephone Encounter (Signed)
Done erx 

## 2011-12-19 ENCOUNTER — Encounter: Payer: Self-pay | Admitting: Gastroenterology

## 2012-01-06 ENCOUNTER — Ambulatory Visit: Payer: Medicare Other | Admitting: Internal Medicine

## 2012-01-12 ENCOUNTER — Ambulatory Visit (HOSPITAL_COMMUNITY)
Admission: RE | Admit: 2012-01-12 | Discharge: 2012-01-12 | Disposition: A | Discharge status: Home / Self Care | Payer: Medicare Other | Source: Ambulatory Visit | Attending: Internal Medicine | Admitting: Internal Medicine

## 2012-01-12 ENCOUNTER — Other Ambulatory Visit (HOSPITAL_BASED_OUTPATIENT_CLINIC_OR_DEPARTMENT_OTHER): Payer: Self-pay | Admitting: Internal Medicine

## 2012-01-12 ENCOUNTER — Encounter (HOSPITAL_BASED_OUTPATIENT_CLINIC_OR_DEPARTMENT_OTHER): Payer: Medicare Other | Attending: Internal Medicine

## 2012-01-12 DIAGNOSIS — L8992 Pressure ulcer of unspecified site, stage 2: Secondary | ICD-10-CM | POA: Insufficient documentation

## 2012-01-12 DIAGNOSIS — M869 Osteomyelitis, unspecified: Secondary | ICD-10-CM

## 2012-01-12 DIAGNOSIS — L989 Disorder of the skin and subcutaneous tissue, unspecified: Secondary | ICD-10-CM | POA: Insufficient documentation

## 2012-01-12 DIAGNOSIS — L89609 Pressure ulcer of unspecified heel, unspecified stage: Secondary | ICD-10-CM | POA: Insufficient documentation
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
## 2012-01-15 ENCOUNTER — Other Ambulatory Visit: Payer: Self-pay | Admitting: Internal Medicine

## 2012-01-29 ENCOUNTER — Other Ambulatory Visit: Payer: Self-pay | Admitting: Internal Medicine

## 2012-01-30 NOTE — Telephone Encounter (Signed)
Done erx 

## 2012-01-31 ENCOUNTER — Telehealth: Payer: Self-pay

## 2012-01-31 ENCOUNTER — Ambulatory Visit (INDEPENDENT_AMBULATORY_CARE_PROVIDER_SITE_OTHER): Payer: Medicare Other | Admitting: Internal Medicine

## 2012-01-31 ENCOUNTER — Encounter: Payer: Self-pay | Admitting: Internal Medicine

## 2012-01-31 ENCOUNTER — Other Ambulatory Visit (INDEPENDENT_AMBULATORY_CARE_PROVIDER_SITE_OTHER): Payer: Medicare Other

## 2012-01-31 VITALS — BP 114/64 | HR 57 | Temp 97.5°F | Ht 69.0 in | Wt 284.4 lb

## 2012-01-31 DIAGNOSIS — D649 Anemia, unspecified: Secondary | ICD-10-CM | POA: Insufficient documentation

## 2012-01-31 DIAGNOSIS — Z79899 Other long term (current) drug therapy: Secondary | ICD-10-CM

## 2012-01-31 DIAGNOSIS — Z23 Encounter for immunization: Secondary | ICD-10-CM

## 2012-01-31 DIAGNOSIS — Z0000 Encounter for general adult medical examination without abnormal findings: Secondary | ICD-10-CM

## 2012-01-31 MED ORDER — LISINOPRIL 20 MG PO TABS: 20.0000 mg | ORAL_TABLET | Freq: Every day | ORAL | Status: DC

## 2012-01-31 MED ORDER — TAMSULOSIN HCL 0.4 MG PO CAPS: 0.4000 mg | ORAL_CAPSULE | Freq: Every day | ORAL | Status: DC

## 2012-01-31 MED ORDER — FUROSEMIDE 40 MG PO TABS: ORAL_TABLET | ORAL | Status: DC

## 2012-01-31 MED ORDER — CARVEDILOL 12.5 MG PO TABS: 12.5000 mg | ORAL_TABLET | Freq: Two times a day (BID) | ORAL | Status: DC

## 2012-01-31 MED ORDER — FLUOXETINE HCL 20 MG PO CAPS: 20.0000 mg | ORAL_CAPSULE | Freq: Every day | ORAL | Status: DC

## 2012-01-31 MED ORDER — POTASSIUM CHLORIDE CRYS ER 20 MEQ PO TBCR: 20.0000 meq | EXTENDED_RELEASE_TABLET | Freq: Two times a day (BID) | ORAL | Status: DC

## 2012-01-31 MED ORDER — ROSUVASTATIN CALCIUM 20 MG PO TABS: 20.0000 mg | ORAL_TABLET | Freq: Every day | ORAL | Status: DC

## 2012-01-31 MED ORDER — PANTOPRAZOLE SODIUM 40 MG PO TBEC: 40.0000 mg | DELAYED_RELEASE_TABLET | Freq: Two times a day (BID) | ORAL | Status: DC

## 2012-01-31 MED ORDER — FUROSEMIDE 40 MG PO TABS: 40.0000 mg | ORAL_TABLET | Freq: Two times a day (BID) | ORAL | Status: DC

## 2012-01-31 NOTE — Assessment & Plan Note (Signed)

## 2012-01-31 NOTE — Patient Instructions (Addendum)
You had the flu shot today Your refills will be done per Robin today (all except the phoslo, gabapentin and ativan) Continue all other medications as before Please continue your efforts at being more active, low cholesterol diet, and weight control. Please go to LAB in the Basement for the blood and/or urine tests to be done today You will be contacted by phone if any changes need to be made immediately.  Otherwise, you will receive a letter about your results with an explanation. Please remember to sign up for My Chart at your earliest convenience, as this will be important to you in the future with finding out test results. Please return in 6 months, or sooner if needed

## 2012-01-31 NOTE — Telephone Encounter (Signed)
Clarify furosemide refill instructions.  1 BID or 1/2 BID.
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
## 2012-01-31 NOTE — Telephone Encounter (Signed)
1/2bid  rx re-sent

## 2012-02-02 ENCOUNTER — Encounter (HOSPITAL_BASED_OUTPATIENT_CLINIC_OR_DEPARTMENT_OTHER): Payer: Medicare Other | Attending: Internal Medicine

## 2012-02-02 DIAGNOSIS — L97309 Non-pressure chronic ulcer of unspecified ankle with unspecified severity: Secondary | ICD-10-CM | POA: Insufficient documentation

## 2012-02-02 DIAGNOSIS — I872 Venous insufficiency (chronic) (peripheral): Secondary | ICD-10-CM | POA: Insufficient documentation

## 2012-02-16 ENCOUNTER — Encounter (HOSPITAL_BASED_OUTPATIENT_CLINIC_OR_DEPARTMENT_OTHER): Payer: Medicare Other

## 2012-04-01 ENCOUNTER — Other Ambulatory Visit: Payer: Self-pay | Admitting: Internal Medicine

## 2012-04-05 ENCOUNTER — Encounter: Payer: Self-pay | Admitting: Gastroenterology

## 2012-05-18 ENCOUNTER — Other Ambulatory Visit: Payer: Self-pay | Admitting: Internal Medicine

## 2012-05-21 NOTE — Telephone Encounter (Signed)
Done hardcopy to robin  

## 2012-05-21 NOTE — Telephone Encounter (Signed)
Faxed hardcopy to pharmacy. 

## 2012-06-03 ENCOUNTER — Other Ambulatory Visit: Payer: Self-pay | Admitting: Internal Medicine

## 2012-06-04 NOTE — Telephone Encounter (Signed)
Done erx 

## 2012-07-26 ENCOUNTER — Other Ambulatory Visit: Payer: Self-pay | Admitting: Internal Medicine

## 2012-07-31 ENCOUNTER — Ambulatory Visit (INDEPENDENT_AMBULATORY_CARE_PROVIDER_SITE_OTHER): Payer: Medicare Other | Admitting: Internal Medicine

## 2012-07-31 ENCOUNTER — Encounter: Payer: Self-pay | Admitting: Internal Medicine

## 2012-07-31 ENCOUNTER — Other Ambulatory Visit (INDEPENDENT_AMBULATORY_CARE_PROVIDER_SITE_OTHER): Payer: Medicare Other

## 2012-07-31 VITALS — BP 120/72 | HR 54 | Temp 98.2°F | Ht 69.5 in | Wt 295.4 lb

## 2012-07-31 DIAGNOSIS — R7302 Impaired glucose tolerance (oral): Secondary | ICD-10-CM

## 2012-07-31 DIAGNOSIS — I10 Essential (primary) hypertension: Secondary | ICD-10-CM

## 2012-07-31 DIAGNOSIS — Z0000 Encounter for general adult medical examination without abnormal findings: Secondary | ICD-10-CM

## 2012-07-31 DIAGNOSIS — R7309 Other abnormal glucose: Secondary | ICD-10-CM

## 2012-07-31 DIAGNOSIS — E785 Hyperlipidemia, unspecified: Secondary | ICD-10-CM

## 2012-07-31 DIAGNOSIS — L03116 Cellulitis of left lower limb: Secondary | ICD-10-CM

## 2012-07-31 DIAGNOSIS — L02419 Cutaneous abscess of limb, unspecified: Secondary | ICD-10-CM

## 2012-07-31 LAB — URINALYSIS, ROUTINE W REFLEX MICROSCOPIC
Bilirubin Urine: NEGATIVE
Hgb urine dipstick: NEGATIVE
Leukocytes, UA: NEGATIVE
Nitrite: NEGATIVE
Total Protein, Urine: NEGATIVE
Urobilinogen, UA: 0.2 (ref 0.0–1.0)

## 2012-07-31 LAB — CBC WITH DIFFERENTIAL/PLATELET
Basophils Absolute: 0 10*3/uL (ref 0.0–0.1)
Eosinophils Absolute: 0.2 10*3/uL (ref 0.0–0.7)
HCT: 36.3 % — ABNORMAL LOW (ref 39.0–52.0)
Lymphs Abs: 1.8 10*3/uL (ref 0.7–4.0)
MCV: 82.2 fl (ref 78.0–100.0)
Monocytes Absolute: 0.6 10*3/uL (ref 0.1–1.0)
Monocytes Relative: 8.8 % (ref 3.0–12.0)
Platelets: 173 10*3/uL (ref 150.0–400.0)
RDW: 15.1 % — ABNORMAL HIGH (ref 11.5–14.6)

## 2012-07-31 LAB — HEPATIC FUNCTION PANEL
AST: 23 U/L (ref 0–37)
Albumin: 3.7 g/dL (ref 3.5–5.2)
Alkaline Phosphatase: 87 U/L (ref 39–117)

## 2012-07-31 LAB — TSH: TSH: 6.12 u[IU]/mL — ABNORMAL HIGH (ref 0.35–5.50)

## 2012-07-31 LAB — LIPID PANEL: HDL: 31.4 mg/dL — ABNORMAL LOW (ref >39.00)

## 2012-07-31 LAB — BASIC METABOLIC PANEL
GFR: 58.7 mL/min — ABNORMAL LOW (ref >60.00)
Glucose, Bld: 97 mg/dL (ref 70–99)
Potassium: 4.5 mEq/L (ref 3.5–5.1)
Sodium: 137 mEq/L (ref 135–145)

## 2012-07-31 LAB — LDL CHOLESTEROL, DIRECT: Direct LDL: 82.1 mg/dL

## 2012-07-31 LAB — HEMOGLOBIN A1C: Hgb A1c MFr Bld: 6.2 % (ref 4.6–6.5)

## 2012-07-31 MED ORDER — FUROSEMIDE 40 MG PO TABS: ORAL_TABLET | ORAL | Status: DC

## 2012-07-31 MED ORDER — FLUOXETINE HCL 20 MG PO CAPS: 20.0000 mg | ORAL_CAPSULE | Freq: Every day | ORAL | Status: DC

## 2012-07-31 MED ORDER — CEPHALEXIN 500 MG PO CAPS: 500.0000 mg | ORAL_CAPSULE | Freq: Four times a day (QID) | ORAL | Status: DC

## 2012-07-31 MED ORDER — CARVEDILOL 12.5 MG PO TABS: 12.5000 mg | ORAL_TABLET | Freq: Two times a day (BID) | ORAL | Status: DC

## 2012-07-31 MED ORDER — LISINOPRIL 20 MG PO TABS: 20.0000 mg | ORAL_TABLET | Freq: Every day | ORAL | Status: DC

## 2012-07-31 MED ORDER — GABAPENTIN 300 MG PO CAPS: ORAL_CAPSULE | ORAL | Status: DC

## 2012-07-31 MED ORDER — LORAZEPAM 1 MG PO TABS: ORAL_TABLET | ORAL | Status: DC

## 2012-07-31 MED ORDER — ROSUVASTATIN CALCIUM 20 MG PO TABS: 20.0000 mg | ORAL_TABLET | Freq: Every day | ORAL | Status: DC

## 2012-07-31 NOTE — Patient Instructions (Addendum)
Please take all new medication as prescribed - the antibiotic Please call if the area becomes ulcerated to where you would need the referral to the wound clinic Please continue all other medications as before, and refills have been done if requested- the lorazepam and all the other medications that were needed Please have the pharmacy call with any other refills you may need. Please continue your efforts at being more active, low cholesterol diet, and weight control. You are otherwise up to date with prevention measures today. Please go to the LAB in the Basement (turn left off the elevator) for the tests to be done today You will be contacted by phone if any changes need to be made immediately.  Otherwise, you will receive a letter about your results with an explanation, but please check with MyChart first. Thank you for enrolling in MyChart. Please follow the instructions below to securely access your online medical record. MyChart allows you to send messages to your doctor, view your test results, renew your prescriptions, schedule appointments, and more. To Log into My Chart online, please go by [employer] or [employer] to [email or web address], or download the MyChart App from the [employer] .  Your Username is: [email or web address] (pass [phone]) Please return in 6 months, or sooner if needed
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
## 2012-08-01 ENCOUNTER — Ambulatory Visit: Payer: Medicare Other | Admitting: Internal Medicine

## 2012-08-05 DIAGNOSIS — L03116 Cellulitis of left lower limb: Secondary | ICD-10-CM | POA: Insufficient documentation

## 2012-08-05 NOTE — Assessment & Plan Note (Signed)
Lab Results  Component Value Date   HGBA1C 6.2 07/31/2012   stable overall by history and exam, recent data reviewed with pt, and pt to continue medical treatment as before,  to f/u any worsening symptoms or concerns

## 2012-08-05 NOTE — Assessment & Plan Note (Signed)

## 2012-08-05 NOTE — Assessment & Plan Note (Signed)
Mild to mod, for antibx course,  to f/u any worsening symptoms or concerns 

## 2012-08-23 ENCOUNTER — Telehealth: Payer: Self-pay | Admitting: Internal Medicine

## 2012-08-23 MED ORDER — TRIAMCINOLONE ACETONIDE 0.5 % EX OINT: TOPICAL_OINTMENT | Freq: Two times a day (BID) | CUTANEOUS | Status: DC

## 2012-08-23 NOTE — Telephone Encounter (Signed)
rx corrected, thanks

## 2012-08-23 NOTE — Telephone Encounter (Signed)
Called the patient and he does want the ointment as cream does not work.  Currently not on med list.  Please send to [facility]

## 2012-08-23 NOTE — Telephone Encounter (Signed)
Refill on Triamcinolone Acetonide Ointment 0.1%, for leg wound. Please call into [facility] on [address]. Please call pt once sent

## 2012-08-24 NOTE — Telephone Encounter (Signed)
Pt aware to call pharmacy 

## 2012-08-30 ENCOUNTER — Other Ambulatory Visit: Payer: Self-pay | Admitting: Internal Medicine

## 2012-09-28 ENCOUNTER — Other Ambulatory Visit: Payer: Self-pay | Admitting: Internal Medicine

## 2012-10-02 ENCOUNTER — Telehealth: Payer: Self-pay | Admitting: Internal Medicine

## 2012-10-02 NOTE — Telephone Encounter (Signed)
Randa Evens from [facility] [phone]) called the patient to tell Pepper that Dr. Jonny Ruiz can't write an RX for Triamcinolone Ointment 0.5.  They told him it was for wound care.  He had not called anywhere for a refill.  He does not know why they called him. He still has some of the ointment.  He wants to know why someone called him to tell him this.  Call him to let him know what we find out.

## 2012-10-03 NOTE — Telephone Encounter (Signed)
Ok to cancel refill, ok to let pt know

## 2012-10-03 NOTE — Telephone Encounter (Signed)
Pharmacy informed.

## 2012-10-17 ENCOUNTER — Encounter (INDEPENDENT_AMBULATORY_CARE_PROVIDER_SITE_OTHER): Payer: Medicare Other

## 2012-10-17 DIAGNOSIS — I6529 Occlusion and stenosis of unspecified carotid artery: Secondary | ICD-10-CM

## 2012-10-22 ENCOUNTER — Ambulatory Visit: Payer: Medicare Other | Admitting: Cardiology

## 2012-10-24 ENCOUNTER — Other Ambulatory Visit: Payer: Self-pay | Admitting: Internal Medicine

## 2012-10-31 ENCOUNTER — Other Ambulatory Visit: Payer: Self-pay

## 2012-12-03 ENCOUNTER — Telehealth: Payer: Self-pay | Admitting: Internal Medicine

## 2012-12-03 NOTE — Telephone Encounter (Signed)
Requesting a refill on tramadol.  Leaving town tomorrow morning at 10 am. [facility] [address].

## 2012-12-04 MED ORDER — TRAMADOL HCL 50 MG PO TABS: ORAL_TABLET | ORAL | Status: DC

## 2012-12-04 NOTE — Telephone Encounter (Signed)
Faxed hardcopy to [facility] and informed the patient

## 2012-12-04 NOTE — Telephone Encounter (Signed)
Done hardcopy to robin  

## 2012-12-25 ENCOUNTER — Encounter: Payer: Self-pay | Admitting: Cardiology

## 2012-12-25 ENCOUNTER — Ambulatory Visit (INDEPENDENT_AMBULATORY_CARE_PROVIDER_SITE_OTHER): Payer: Medicare Other | Admitting: Cardiology

## 2012-12-25 VITALS — BP 144/84 | HR 54 | Ht 69.5 in | Wt 288.2 lb

## 2012-12-25 DIAGNOSIS — I679 Cerebrovascular disease, unspecified: Secondary | ICD-10-CM

## 2012-12-25 DIAGNOSIS — I714 Abdominal aortic aneurysm, without rupture, unspecified: Secondary | ICD-10-CM

## 2012-12-25 DIAGNOSIS — I10 Essential (primary) hypertension: Secondary | ICD-10-CM

## 2012-12-25 DIAGNOSIS — E785 Hyperlipidemia, unspecified: Secondary | ICD-10-CM

## 2012-12-25 DIAGNOSIS — I2510 Atherosclerotic heart disease of native coronary artery without angina pectoris: Secondary | ICD-10-CM

## 2012-12-25 NOTE — Assessment & Plan Note (Signed)
Continue aspirin and statin. Followup carotid Dopplers July 2015. 

## 2012-12-25 NOTE — Assessment & Plan Note (Signed)
Blood pressure mildly elevated but controlled at home. Continue present medications. Potassium and renal function monitored by primary care.
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
## 2012-12-25 NOTE — Assessment & Plan Note (Signed)
Repeat abdominal ultrasound. 

## 2012-12-25 NOTE — Assessment & Plan Note (Signed)
Continue aspirin and statin. Schedule Myoview for risk stratification. 

## 2012-12-25 NOTE — Patient Instructions (Addendum)
Your physician wants you to follow-up in: ONE YEAR WITH DR CRENSHAW You will receive a reminder letter in the mail two months in advance. If you don't receive a letter, please call our office to schedule the follow-up appointment.   Your physician has requested that you have a lexiscan myoview. For further information please visit [email or web address]. Please follow instruction sheet, as given.   Your physician has requested that you have an abdominal aorta duplex. During this test, an ultrasound is used to evaluate the aorta. Allow 30 minutes for this exam. Do not eat after midnight the day before and avoid carbonated beverages  

## 2012-12-25 NOTE — Assessment & Plan Note (Signed)
Continue statin. Lipids and liver monitored by primary care. 

## 2012-12-31 ENCOUNTER — Ambulatory Visit (HOSPITAL_COMMUNITY): Payer: Medicare Other | Attending: Cardiology

## 2012-12-31 DIAGNOSIS — I2510 Atherosclerotic heart disease of native coronary artery without angina pectoris: Secondary | ICD-10-CM | POA: Insufficient documentation

## 2012-12-31 DIAGNOSIS — I10 Essential (primary) hypertension: Secondary | ICD-10-CM | POA: Insufficient documentation

## 2012-12-31 DIAGNOSIS — E785 Hyperlipidemia, unspecified: Secondary | ICD-10-CM | POA: Insufficient documentation

## 2012-12-31 DIAGNOSIS — I700 Atherosclerosis of aorta: Secondary | ICD-10-CM | POA: Insufficient documentation

## 2012-12-31 DIAGNOSIS — I714 Abdominal aortic aneurysm, without rupture, unspecified: Secondary | ICD-10-CM

## 2012-12-31 DIAGNOSIS — Z951 Presence of aortocoronary bypass graft: Secondary | ICD-10-CM | POA: Insufficient documentation

## 2012-12-31 DIAGNOSIS — R109 Unspecified abdominal pain: Secondary | ICD-10-CM

## 2012-12-31 DIAGNOSIS — Z8249 Family history of ischemic heart disease and other diseases of the circulatory system: Secondary | ICD-10-CM | POA: Insufficient documentation

## 2013-01-02 ENCOUNTER — Encounter: Payer: Self-pay | Admitting: Cardiovascular Disease

## 2013-01-07 ENCOUNTER — Ambulatory Visit (HOSPITAL_COMMUNITY): Payer: Medicare Other | Attending: Cardiology | Admitting: Radiology

## 2013-01-07 VITALS — BP 136/70 | Ht 70.0 in | Wt 287.0 lb

## 2013-01-07 DIAGNOSIS — I509 Heart failure, unspecified: Secondary | ICD-10-CM | POA: Insufficient documentation

## 2013-01-07 DIAGNOSIS — I10 Essential (primary) hypertension: Secondary | ICD-10-CM | POA: Insufficient documentation

## 2013-01-07 DIAGNOSIS — I252 Old myocardial infarction: Secondary | ICD-10-CM | POA: Insufficient documentation

## 2013-01-07 DIAGNOSIS — R079 Chest pain, unspecified: Secondary | ICD-10-CM

## 2013-01-07 DIAGNOSIS — Z8249 Family history of ischemic heart disease and other diseases of the circulatory system: Secondary | ICD-10-CM | POA: Insufficient documentation

## 2013-01-07 DIAGNOSIS — I739 Peripheral vascular disease, unspecified: Secondary | ICD-10-CM | POA: Insufficient documentation

## 2013-01-07 DIAGNOSIS — Z951 Presence of aortocoronary bypass graft: Secondary | ICD-10-CM | POA: Insufficient documentation

## 2013-01-07 DIAGNOSIS — I2510 Atherosclerotic heart disease of native coronary artery without angina pectoris: Secondary | ICD-10-CM

## 2013-01-07 DIAGNOSIS — I779 Disorder of arteries and arterioles, unspecified: Secondary | ICD-10-CM | POA: Insufficient documentation

## 2013-01-07 DIAGNOSIS — Z87891 Personal history of nicotine dependence: Secondary | ICD-10-CM | POA: Insufficient documentation

## 2013-01-07 DIAGNOSIS — Z8673 Personal history of transient ischemic attack (TIA), and cerebral infarction without residual deficits: Secondary | ICD-10-CM | POA: Insufficient documentation

## 2013-01-07 MED ORDER — TECHNETIUM TC 99M SESTAMIBI GENERIC - CARDIOLITE
33.0000 | Freq: Once | INTRAVENOUS | Status: AC | PRN
  Administered 2013-01-07: 33 via INTRAVENOUS

## 2013-01-07 MED ORDER — REGADENOSON 0.4 MG/5ML IV SOLN
0.4000 mg | Freq: Once | INTRAVENOUS | Status: AC
  Administered 2013-01-07: 0.4 mg via INTRAVENOUS

## 2013-01-07 MED ORDER — TECHNETIUM TC 99M SESTAMIBI GENERIC - CARDIOLITE
11.0000 | Freq: Once | INTRAVENOUS | Status: AC | PRN
  Administered 2013-01-07: 11 via INTRAVENOUS

## 2013-01-07 NOTE — Progress Notes (Signed)
[facility] SITE 3 NUCLEAR MED [address] [place], [place] [address] [phone]    Cardiology Nuclear Med Study  Steven Charles is a 67 y.o. male     MRN : [id number]     DOB: Aug 09, 1945  Procedure Date: 01/07/2013  Nuclear Med Background Indication for Stress Test:  Evaluation for Ischemia and Graft Patency History: CHF, '07 MI> CABG; '08 Echo:EF= 55%; 01-2010 Myocardial Perfusion Study-Small Inferobasal Ischemia, EF=59% Cardiac Risk Factors: Carotid Disease, CVA, Family History - CAD, History of Smoking, Hypertension, Lipids and PVD  Symptoms:  n/a   Nuclear Pre-Procedure Caffeine/Decaff Intake:  None > 12 hrs NPO After: 7:00pm   Lungs:  clear O2 Sat: 97% on room air. IV 0.9% NS with Angio Cath:  20g  IV Site: R Antecubital x 1, tolerated well IV Started by:  Irean Hong, RN  Chest Size (in):  48 Cup Size: n/a  Height: 5\' 10"  (1.778 m)  Weight:  287 lb (130.182 kg)  BMI:  Body mass index is 41.18 kg/(m^2). Tech Comments:  Took Coreg and Lisinopril this am    Nuclear Med Study 1 or 2 day study: 1 day  Stress Test Type:  Treadmill/Lexiscan  Reading MD: Willa Rough, MD  Order Authorizing Provider:  Olga Millers, MD  Resting Radionuclide: Technetium 31m Sestamibi  Resting Radionuclide Dose: 11.0 mCi   Stress Radionuclide:  Technetium 16m Sestamibi  Stress Radionuclide Dose: 33.0 mCi           Stress Protocol Rest HR: 46 Stress HR: 97  Rest BP: 136/70 Stress BP: 150/63  Exercise Time (min): n/a METS: n/a   Predicted Max HR: 151 bpm % Max HR: 64.24 bpm Rate Pressure Product: 21308   Dose of Adenosine (mg):  n/a Dose of Lexiscan: 0.4 mg  Dose of Atropine (mg): n/a Dose of Dobutamine: n/a mcg/kg/min (at max HR)  Stress Test Technologist: Milana Na, EMT-P  Nuclear Technologist:  Domenic Polite, CNMT     Rest Procedure:  Myocardial perfusion imaging was performed at rest 45 minutes following the intravenous administration of Technetium 63m  Sestamibi. Rest ECG: Sinus bradycardia  Stress Procedure:  The patient received IV Lexiscan 0.4 mg over 15-seconds with concurrent low level exercise and then Technetium 11m Sestamibi was injected at 30-seconds while the patient continued walking one more minute. This patient had heaviness all over and so with the Lexiscan injection. Quantitative spect images were obtained after a 45-minute delay. Stress ECG: No significant change from baseline ECG  QPS Raw Data Images:  Normal; no motion artifact; normal heart/lung ratio. Stress Images:  Visually there is mild decreased uptake in the inferior wall. This is not seen in the quantitative analysis. Rest Images:  The rest image appears the same as the stress image. Subtraction (SDS):  There is no definite ischemia. Transient Ischemic Dilatation (Normal <1.22):  n/a Lung/Heart Ratio (Normal <0.45):  0.47  Quantitative Gated Spect Images QGS EDV:  176 ml QGS ESV:  99 ml  Impression Exercise Capacity:  Lexiscan with low level exercise. BP Response:  Normal blood pressure response. Clinical Symptoms:  Shortness of breath and heaviness all over ECG Impression:  No significant ST segment change suggestive of ischemia. Comparison with Prior Nuclear Study: Images are compared with the report of the study from November, 2011  Overall Impression:  The study report from November, 2011 suggests some inferior ischemia. The end-diastolic volume was 131 mL and the ejection fraction was 59% at that time. The ventricle is  larger now by this study. The end-diastolic volume is 160 mL. Ejection fraction is now 43%. There may be mild inferior scar. There is no marked ischemia. This is a low/moderate risk scan  LV Ejection Fraction: 43%.  LV Wall Motion:  Mild global hypokinesis. Slightly more marked in the inferior wall.  Willa Rough, MD

## 2013-01-08 ENCOUNTER — Telehealth: Payer: Self-pay | Admitting: Cardiology

## 2013-01-08 NOTE — Telephone Encounter (Signed)
Spoke with pt, aware of nuclear results 

## 2013-01-08 NOTE — Telephone Encounter (Signed)
Follow Up  ° °Pt called back for results//SR  °

## 2013-01-11 ENCOUNTER — Encounter: Payer: Self-pay | Admitting: Gastroenterology

## 2013-01-24 ENCOUNTER — Other Ambulatory Visit: Payer: Self-pay | Admitting: Internal Medicine

## 2013-01-24 NOTE — Telephone Encounter (Signed)
Faxed hardcopy to [facility] [address] 

## 2013-01-24 NOTE — Telephone Encounter (Signed)
Done hardcopy to robin  

## 2013-01-31 ENCOUNTER — Ambulatory Visit: Payer: Medicare Other | Admitting: Internal Medicine

## 2013-01-31 ENCOUNTER — Other Ambulatory Visit: Payer: Self-pay | Admitting: Internal Medicine

## 2013-01-31 ENCOUNTER — Other Ambulatory Visit: Payer: Self-pay

## 2013-02-01 ENCOUNTER — Ambulatory Visit (INDEPENDENT_AMBULATORY_CARE_PROVIDER_SITE_OTHER): Payer: Medicare Other | Admitting: Internal Medicine

## 2013-02-01 ENCOUNTER — Encounter: Payer: Self-pay | Admitting: Internal Medicine

## 2013-02-01 VITALS — BP 122/80 | HR 59 | Temp 97.9°F | Ht 70.0 in | Wt 287.4 lb

## 2013-02-01 DIAGNOSIS — Z23 Encounter for immunization: Secondary | ICD-10-CM

## 2013-02-01 DIAGNOSIS — E785 Hyperlipidemia, unspecified: Secondary | ICD-10-CM

## 2013-02-01 DIAGNOSIS — I10 Essential (primary) hypertension: Secondary | ICD-10-CM

## 2013-02-01 DIAGNOSIS — R21 Rash and other nonspecific skin eruption: Secondary | ICD-10-CM

## 2013-02-01 DIAGNOSIS — R7302 Impaired glucose tolerance (oral): Secondary | ICD-10-CM

## 2013-02-01 DIAGNOSIS — R39198 Other difficulties with micturition: Secondary | ICD-10-CM | POA: Insufficient documentation

## 2013-02-01 DIAGNOSIS — Z0000 Encounter for general adult medical examination without abnormal findings: Secondary | ICD-10-CM

## 2013-02-01 DIAGNOSIS — R7309 Other abnormal glucose: Secondary | ICD-10-CM

## 2013-02-01 MED ORDER — CLOTRIMAZOLE-BETAMETHASONE 1-0.05 % EX CREA: TOPICAL_CREAM | CUTANEOUS | Status: DC

## 2013-02-01 MED ORDER — TAMSULOSIN HCL 0.4 MG PO CAPS: ORAL_CAPSULE | ORAL | Status: DC

## 2013-02-01 NOTE — Patient Instructions (Addendum)
You had the flu shot today Please return in 2 wks for a Nurse Visit for the new Prevnar pneumonia shot Please take all new medication as prescribed - the cream Please continue all other medications as before, except you can increase the flomax to twice per day Please call if this does not help, as you should probably see urology  Please have the pharmacy call with any other refills you may need.  Please keep your appointments with your specialists as you may have planned - cardiology  Please continue your efforts at being more active, low cholesterol diabetic diet, and weight control.  Please call if your daytime sleepiness is worsening, as we could consider referral for possible sleep apnea  Please return in 6 months, or sooner if needed, with Lab testing done 3-5 days before

## 2013-02-01 NOTE — Assessment & Plan Note (Signed)
Penile recurrent, for lotrisone prn

## 2013-02-01 NOTE — Assessment & Plan Note (Signed)
stable overall by history and exam, recent data reviewed with pt, and pt to continue medical treatment as before,  to f/u any worsening symptoms or concerns Lab Results  Component Value Date   LDLCALC 76 10/10/2011

## 2013-02-01 NOTE — Assessment & Plan Note (Signed)
stable overall by history and exam, recent data reviewed with pt, and pt to continue medical treatment as before,  to f/u any worsening symptoms or concerns BP Readings from Last 3 Encounters:  02/01/13 122/80  01/07/13 136/70  12/25/12 144/84
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
## 2013-02-01 NOTE — Assessment & Plan Note (Signed)
Asympt, for a1c lab

## 2013-02-01 NOTE — Assessment & Plan Note (Signed)
For trial flomax bid, but consider urology if not improved

## 2013-02-01 NOTE — Progress Notes (Signed)
Pre visit review using our clinic review tool, if applicable. No additional management support is needed unless otherwise documented below in the visit note. 

## 2013-02-15 ENCOUNTER — Ambulatory Visit (INDEPENDENT_AMBULATORY_CARE_PROVIDER_SITE_OTHER): Payer: Medicare Other

## 2013-02-15 DIAGNOSIS — Z23 Encounter for immunization: Secondary | ICD-10-CM

## 2013-03-10 ENCOUNTER — Other Ambulatory Visit: Payer: Self-pay | Admitting: Internal Medicine

## 2013-03-13 ENCOUNTER — Encounter: Payer: Self-pay | Admitting: Internal Medicine

## 2013-03-19 ENCOUNTER — Other Ambulatory Visit: Payer: Self-pay | Admitting: Internal Medicine

## 2013-04-04 MED ORDER — TRAMADOL HCL 50 MG PO TABS: ORAL_TABLET | ORAL | Status: DC

## 2013-04-04 NOTE — Telephone Encounter (Signed)
Done hardcopy to robin/staff  Rest of refills to staff

## 2013-04-05 ENCOUNTER — Other Ambulatory Visit: Payer: Self-pay | Admitting: *Deleted

## 2013-04-05 MED ORDER — MELOXICAM 15 MG PO TABS: ORAL_TABLET | ORAL | Status: DC

## 2013-04-05 MED ORDER — FUROSEMIDE 40 MG PO TABS: ORAL_TABLET | ORAL | Status: DC

## 2013-04-05 MED ORDER — GABAPENTIN 300 MG PO CAPS: ORAL_CAPSULE | ORAL | Status: DC

## 2013-04-05 MED ORDER — TAMSULOSIN HCL 0.4 MG PO CAPS: ORAL_CAPSULE | ORAL | Status: DC

## 2013-04-05 MED ORDER — CARVEDILOL 12.5 MG PO TABS: 12.5000 mg | ORAL_TABLET | Freq: Two times a day (BID) | ORAL | Status: DC

## 2013-04-05 MED ORDER — LISINOPRIL 20 MG PO TABS: 20.0000 mg | ORAL_TABLET | Freq: Every day | ORAL | Status: DC

## 2013-04-05 MED ORDER — DICLOFENAC SODIUM 75 MG PO TBEC: DELAYED_RELEASE_TABLET | ORAL | Status: DC

## 2013-04-05 MED ORDER — PANTOPRAZOLE SODIUM 40 MG PO TBEC: DELAYED_RELEASE_TABLET | ORAL | Status: DC

## 2013-04-05 MED ORDER — FLUOXETINE HCL 20 MG PO CAPS: 20.0000 mg | ORAL_CAPSULE | Freq: Every day | ORAL | Status: DC

## 2013-04-05 NOTE — Telephone Encounter (Signed)
Sent email needing new rx's sent on meds to [facility]. MD has already ok & printed tramadol will fax manually, all other meds went electronically...Steven Charles

## 2013-04-05 NOTE — Telephone Encounter (Signed)
Faxed tramadol manually all other meds went electronically...Steven Charles

## 2013-04-13 ENCOUNTER — Encounter: Payer: Self-pay | Admitting: Internal Medicine

## 2013-04-15 NOTE — Telephone Encounter (Signed)
Since this is a controlled substance, we can cancel the refills at [facility], and send next rx to [facility], is that ok? This is what is often done with controlled substances due to the large number of pills with the 90 day supplies

## 2013-04-18 NOTE — Telephone Encounter (Signed)
rx was done jan 8, cannot be re-done as this is a controlled substance

## 2013-04-26 ENCOUNTER — Encounter: Payer: Self-pay | Admitting: Internal Medicine

## 2013-04-26 ENCOUNTER — Ambulatory Visit (INDEPENDENT_AMBULATORY_CARE_PROVIDER_SITE_OTHER)
Admission: RE | Admit: 2013-04-26 | Discharge: 2013-04-26 | Disposition: A | Discharge status: Home / Self Care | Payer: Medicare HMO | Source: Ambulatory Visit | Attending: Internal Medicine | Admitting: Internal Medicine

## 2013-04-26 ENCOUNTER — Ambulatory Visit (INDEPENDENT_AMBULATORY_CARE_PROVIDER_SITE_OTHER): Payer: Medicare HMO | Admitting: Internal Medicine

## 2013-04-26 VITALS — BP 138/72 | HR 74 | Temp 100.2°F | Wt 285.0 lb

## 2013-04-26 DIAGNOSIS — J020 Streptococcal pharyngitis: Secondary | ICD-10-CM

## 2013-04-26 DIAGNOSIS — R221 Localized swelling, mass and lump, neck: Secondary | ICD-10-CM

## 2013-04-26 DIAGNOSIS — R059 Cough, unspecified: Secondary | ICD-10-CM

## 2013-04-26 DIAGNOSIS — R220 Localized swelling, mass and lump, head: Secondary | ICD-10-CM

## 2013-04-26 DIAGNOSIS — R609 Edema, unspecified: Secondary | ICD-10-CM

## 2013-04-26 DIAGNOSIS — R05 Cough: Secondary | ICD-10-CM

## 2013-04-26 LAB — POCT RAPID STREP A (OFFICE): Rapid Strep A Screen: NEGATIVE

## 2013-04-26 MED ORDER — PROMETHAZINE-CODEINE 6.25-10 MG/5ML PO SYRP: 5.0000 mL | ORAL_SOLUTION | ORAL | Status: DC | PRN

## 2013-04-26 MED ORDER — AMOXICILLIN 500 MG PO CAPS: 500.0000 mg | ORAL_CAPSULE | Freq: Three times a day (TID) | ORAL | Status: DC

## 2013-04-26 NOTE — Progress Notes (Signed)
Subjective:    Patient ID: Steven Charles, male    DOB: 05-21-1945, 68 y.o.   MRN: [id number]  Sore Throat  This is a new problem. The current episode started yesterday. The problem has been gradually worsening. The pain is worse on the left side. The maximum temperature recorded prior to his arrival was 101 - 101.9 F. The fever has been present for less than 1 day. The pain is moderate. Associated symptoms include coughing, neck pain, swollen glands (chronic parotid) and trouble swallowing (due to pain). Pertinent negatives include no abdominal pain, congestion, diarrhea, ear discharge, ear pain, headaches, plugged ear sensation, shortness of breath or vomiting. Strep: ? He has tried cool liquids and gargles for the symptoms. The treatment provided mild relief.  Cough Associated symptoms include a fever. Pertinent negatives include no ear pain, headaches or shortness of breath.  Fever  Associated symptoms include coughing. Pertinent negatives include no abdominal pain, congestion, diarrhea, ear pain, headaches or vomiting.    PMH reviewed  Review of Systems  Constitutional: Positive for fever.  HENT: Positive for trouble swallowing (due to pain). Negative for congestion, ear discharge and ear pain.   Respiratory: Positive for cough. Negative for shortness of breath.   Gastrointestinal: Negative for vomiting, abdominal pain and diarrhea.  Musculoskeletal: Positive for neck pain.  Neurological: Negative for headaches.       Objective:   Physical Exam BP 138/72  Pulse 74  Temp(Src) 100.2 F (37.9 C) (Oral)  Wt 285 lb (129.275 kg)  SpO2 94% Wt Readings from Last 3 Encounters:  04/26/13 285 lb (129.275 kg)  02/01/13 287 lb 6 oz (130.352 kg)  01/07/13 287 lb (130.182 kg)   Constitutional: he is obese, bt appears well-developed and well-nourished. Tired, but no distress.  HENT: Head: Normocephalic and atraumatic. Sinus nontender. Left greater than right parotid swelling and firmness.  Non tender. Anterior cervical chain left greater than right with tenderness but no fluctuance, no evidence for tonsillar abscess Ears: B TMs ok, no erythema or effusion; Nose: Nose normal. Mouth/Throat: Oropharynx is red but clear and moist. No obvious oropharyngeal exudate.  Eyes: Conjunctivae and EOM are normal. Pupils are equal, round, and reactive to light. No scleral icterus.  Neck: Thick, Normal range of motion. Neck supple. No JVD present. No thyromegaly present.  Cardiovascular: Normal rate, regular rhythm and normal heart sounds.  No murmur heard. No BLE edema. Pulmonary/Chest: Effort normal and breath sounds diminished B bases. No respiratory distress. he has no wheezes.  Neurological: he is alert and oriented to person, place, and time. No cranial nerve deficit. Coordination, balance, strength, speech and gait are normal.  Skin: Skin is warm and dry. No rash noted. No erythema.  Psychiatric: he has a normal mood and affect. behavior is normal. Judgment and thought content normal.  Lab Results  Component Value Date   WBC 6.3 07/31/2012   HGB 12.4* 07/31/2012   HCT 36.3* 07/31/2012   PLT 173.0 07/31/2012   GLUCOSE 97 07/31/2012   CHOL 151 07/31/2012   TRIG 242.0* 07/31/2012   HDL 31.40* 07/31/2012   LDLDIRECT 82.1 07/31/2012   LDLCALC 76 10/10/2011   ALT 22 07/31/2012   AST 23 07/31/2012   NA 137 07/31/2012   K 4.5 07/31/2012   CL 104 07/31/2012   CREATININE 1.3 07/31/2012   BUN 19 07/31/2012   CO2 29 07/31/2012   TSH 6.12* 07/31/2012   PSA 0.28 07/31/2012   INR 0.94 02/07/2010   HGBA1C 6.2 07/31/2012  MICROALBUR 0.3 12/12/2006        Assessment & Plan:   Acute pharyngitis, rapid strep negative Cough symptoms x24 hours Low-grade fever  Will treat empirically for streptococcal infection -amoxicillin x10 days Check chest x-ray Intermedics relief with promethazine codeine syrup Continue Tylenol/ibuprofen for fever and myalgias Hydrate, saltwater gargle History call symptoms unimproved in next 7 days,  sooner if worse

## 2013-04-26 NOTE — Progress Notes (Signed)
Pre-visit discussion using our clinic review tool. No additional management support is needed unless otherwise documented below in the visit note.  

## 2013-04-26 NOTE — Patient Instructions (Addendum)
It was good to see you today.  Amoxicillin antibiotics x 10days and prescription cough syrup - Your prescription(s) have been submitted (or given to you to take) to your pharmacy. Please take as directed and contact our office if you believe you are having problem(s) with the medication(s).  Alternate between ibuprofen and tylenol for aches, pain and fever symptoms as discussed  Hydrate, rest and call if worse or unimproved  Pharyngitis Pharyngitis is redness, pain, and swelling (inflammation) of your pharynx.  CAUSES  Pharyngitis is usually caused by infection. Most of the time, these infections are from viruses (viral) and are part of a cold. However, sometimes pharyngitis is caused by bacteria (bacterial). Pharyngitis can also be caused by allergies. Viral pharyngitis may be spread from person to person by coughing, sneezing, and personal items or utensils (cups, forks, spoons, toothbrushes). Bacterial pharyngitis may be spread from person to person by more intimate contact, such as kissing.  SIGNS AND SYMPTOMS  Symptoms of pharyngitis include:   Sore throat.   Tiredness (fatigue).   Low-grade fever.   Headache.  Joint pain and muscle aches.  Skin rashes.  Swollen lymph nodes.  Plaque-like film on throat or tonsils (often seen with bacterial pharyngitis). DIAGNOSIS  Your health care provider will ask you questions about your illness and your symptoms. Your medical history, along with a physical exam, is often all that is needed to diagnose pharyngitis. Sometimes, a rapid strep test is done. Other lab tests may also be done, depending on the suspected cause.  TREATMENT  Viral pharyngitis will usually get better in 3 4 days without the use of medicine. Bacterial pharyngitis is treated with medicines that kill germs (antibiotics).  HOME CARE INSTRUCTIONS   Drink enough water and fluids to keep your urine clear or pale yellow.   Only take over-the-counter or prescription  medicines as directed by your health care provider:   If you are prescribed antibiotics, make sure you finish them even if you start to feel better.   Do not take aspirin.   Get lots of rest.   Gargle with 8 oz of salt water ( tsp of salt per 1 qt of water) as often as every 1 2 hours to soothe your throat.   Throat lozenges (if you are not at risk for choking) or sprays may be used to soothe your throat. SEEK MEDICAL CARE IF:   You have large, tender lumps in your neck.  You have a rash.  You cough up green, yellow-brown, or bloody spit. SEEK IMMEDIATE MEDICAL CARE IF:   Your neck becomes stiff.  You drool or are unable to swallow liquids.  You vomit or are unable to keep medicines or liquids down.  You have severe pain that does not go away with the use of recommended medicines.  You have trouble breathing (not caused by a stuffy nose). MAKE SURE YOU:   Understand these instructions.  Will watch your condition.  Will get help right away if you are not doing well or get worse. Document Released: 03/14/2005 Document Revised: 01/02/2013 Document Reviewed: 11/19/2012 [facility] Patient Information 2014 [place].

## 2013-05-04 ENCOUNTER — Other Ambulatory Visit: Payer: Self-pay | Admitting: Internal Medicine

## 2013-05-06 ENCOUNTER — Other Ambulatory Visit: Payer: Self-pay | Admitting: *Deleted

## 2013-05-06 MED ORDER — PROMETHAZINE-CODEINE 6.25-10 MG/5ML PO SYRP
5.0000 mL | ORAL_SOLUTION | ORAL | Status: DC | PRN
Start: 2013-05-06 — End: 2013-08-13

## 2013-05-07 ENCOUNTER — Telehealth: Payer: Self-pay | Admitting: *Deleted

## 2013-05-07 NOTE — Telephone Encounter (Signed)
If no fever or worsening pain, ok to watch for now, and consider mucinex otc prn for congestion

## 2013-05-07 NOTE — Telephone Encounter (Signed)
Pt states he would also like another round of the amoxicillin. His head is starting to be stuffy again. Thought he was getting better...Steven Charles

## 2013-05-07 NOTE — Telephone Encounter (Signed)
Notified pt rx ready for pick-up.../lmb 

## 2013-05-08 ENCOUNTER — Other Ambulatory Visit: Payer: Self-pay

## 2013-05-08 ENCOUNTER — Encounter: Payer: Self-pay | Admitting: Internal Medicine

## 2013-05-08 MED ORDER — POTASSIUM CHLORIDE CRYS ER 20 MEQ PO TBCR: 20.0000 meq | EXTENDED_RELEASE_TABLET | Freq: Two times a day (BID) | ORAL | Status: DC

## 2013-05-08 NOTE — Telephone Encounter (Signed)
Agree, followup if worsening symptoms to reevaluate potential etiology and determine if additional antibiotics needed

## 2013-05-08 NOTE — Telephone Encounter (Signed)
Notified pt with md response.../lmb 

## 2013-06-20 ENCOUNTER — Telehealth: Payer: Self-pay

## 2013-06-20 NOTE — Telephone Encounter (Signed)
Patient called for samples of crestor placed samples up front

## 2013-06-25 ENCOUNTER — Telehealth: Payer: Self-pay | Admitting: Internal Medicine

## 2013-06-25 NOTE — Telephone Encounter (Signed)
Patient stopped by requesting letter to be excused from jury duty. Please advise if letter is okay.

## 2013-06-25 NOTE — Telephone Encounter (Signed)
[place] for jury duty letter due to chronic medical conditions  Shirlean Mylar to assist please

## 2013-06-26 ENCOUNTER — Telehealth: Payer: Self-pay | Admitting: Internal Medicine

## 2013-06-26 NOTE — Telephone Encounter (Signed)
Letter completed, on MD's desk to sign and will put in cabnet at the front.  Patient has been informed

## 2013-06-26 NOTE — Telephone Encounter (Signed)
Patient picked up a letter in regards to [employer].  He is needing original [employer] back.  Please advice.

## 2013-06-27 NOTE — Telephone Encounter (Signed)
Spoke with pt. Placed up front for him to pick-up.

## 2013-07-19 ENCOUNTER — Telehealth: Payer: Self-pay | Admitting: *Deleted

## 2013-07-19 NOTE — Telephone Encounter (Signed)
Patient called for crestor samples. I will place at the front desk for pick up. 

## 2013-07-29 ENCOUNTER — Other Ambulatory Visit: Payer: Self-pay | Admitting: Internal Medicine

## 2013-07-29 NOTE — Telephone Encounter (Signed)
Done hardcopy to robin  

## 2013-07-30 NOTE — Telephone Encounter (Signed)
Faxed hardcopy to [facility] [place] 

## 2013-08-02 ENCOUNTER — Ambulatory Visit: Payer: Medicare Other | Admitting: Internal Medicine

## 2013-08-13 ENCOUNTER — Other Ambulatory Visit (INDEPENDENT_AMBULATORY_CARE_PROVIDER_SITE_OTHER): Payer: Commercial Managed Care - HMO

## 2013-08-13 ENCOUNTER — Encounter: Payer: Self-pay | Admitting: Internal Medicine

## 2013-08-13 ENCOUNTER — Ambulatory Visit (INDEPENDENT_AMBULATORY_CARE_PROVIDER_SITE_OTHER): Payer: Commercial Managed Care - HMO | Admitting: Internal Medicine

## 2013-08-13 VITALS — BP 140/72 | HR 61 | Temp 98.1°F | Ht 69.0 in | Wt 290.4 lb

## 2013-08-13 DIAGNOSIS — Z0000 Encounter for general adult medical examination without abnormal findings: Secondary | ICD-10-CM

## 2013-08-13 DIAGNOSIS — J309 Allergic rhinitis, unspecified: Secondary | ICD-10-CM

## 2013-08-13 DIAGNOSIS — L989 Disorder of the skin and subcutaneous tissue, unspecified: Secondary | ICD-10-CM

## 2013-08-13 DIAGNOSIS — R7309 Other abnormal glucose: Secondary | ICD-10-CM

## 2013-08-13 DIAGNOSIS — R7302 Impaired glucose tolerance (oral): Secondary | ICD-10-CM

## 2013-08-13 LAB — CBC WITH DIFFERENTIAL/PLATELET
BASOS PCT: 0.4 % (ref 0.0–3.0)
Basophils Absolute: 0 10*3/uL (ref 0.0–0.1)
EOS ABS: 0.4 10*3/uL (ref 0.0–0.7)
EOS PCT: 5.1 % — AB (ref 0.0–5.0)
HCT: 38.4 % — ABNORMAL LOW (ref 39.0–52.0)
Hemoglobin: 12.9 g/dL — ABNORMAL LOW (ref 13.0–17.0)
LYMPHS PCT: 24.1 % (ref 12.0–46.0)
Lymphs Abs: 1.9 10*3/uL (ref 0.7–4.0)
MCHC: 33.6 g/dL (ref 30.0–36.0)
MCV: 89.6 fl (ref 78.0–100.0)
Monocytes Absolute: 0.6 10*3/uL (ref 0.1–1.0)
Monocytes Relative: 7.6 % (ref 3.0–12.0)
Neutro Abs: 4.9 10*3/uL (ref 1.4–7.7)
Neutrophils Relative %: 62.8 % (ref 43.0–77.0)
Platelets: 192 10*3/uL (ref 150.0–400.0)
RBC: 4.29 Mil/uL (ref 4.22–5.81)
RDW: 14.3 % (ref 11.5–15.5)
WBC: 7.7 10*3/uL (ref 4.0–10.5)

## 2013-08-13 LAB — HEPATIC FUNCTION PANEL
ALT: 21 U/L (ref 0–53)
AST: 21 U/L (ref 0–37)
Albumin: 3.7 g/dL (ref 3.5–5.2)
Alkaline Phosphatase: 84 U/L (ref 39–117)
BILIRUBIN DIRECT: 0.1 mg/dL (ref 0.0–0.3)
BILIRUBIN TOTAL: 0.4 mg/dL (ref 0.2–1.2)
Total Protein: 6.6 g/dL (ref 6.0–8.3)

## 2013-08-13 LAB — URINALYSIS, ROUTINE W REFLEX MICROSCOPIC
Bilirubin Urine: NEGATIVE
Hgb urine dipstick: NEGATIVE
KETONES UR: NEGATIVE
LEUKOCYTES UA: NEGATIVE
Nitrite: NEGATIVE
PH: 5.5 (ref 5.0–8.0)
SPECIFIC GRAVITY, URINE: 1.025 (ref 1.000–1.030)
Total Protein, Urine: NEGATIVE
UROBILINOGEN UA: 0.2 (ref 0.0–1.0)
Urine Glucose: NEGATIVE

## 2013-08-13 LAB — BASIC METABOLIC PANEL
BUN: 17 mg/dL (ref 6–23)
CO2: 29 mEq/L (ref 19–32)
CREATININE: 1.3 mg/dL (ref 0.4–1.5)
Calcium: 9 mg/dL (ref 8.4–10.5)
Chloride: 102 mEq/L (ref 96–112)
GFR: 59.59 mL/min — AB (ref >60.00)
Glucose, Bld: 95 mg/dL (ref 70–99)
Potassium: 4.2 mEq/L (ref 3.5–5.1)
Sodium: 137 mEq/L (ref 135–145)

## 2013-08-13 LAB — PSA: PSA: 0.35 ng/mL (ref 0.10–4.00)

## 2013-08-13 LAB — HEMOGLOBIN A1C: Hgb A1c MFr Bld: 6.1 % (ref 4.6–6.5)

## 2013-08-13 LAB — LIPID PANEL
CHOL/HDL RATIO: 5
Cholesterol: 153 mg/dL (ref 0–200)
HDL: 32.1 mg/dL — AB (ref >39.00)
LDL CALC: 74 mg/dL (ref 0–99)
TRIGLYCERIDES: 233 mg/dL — AB (ref 0.0–149.0)
VLDL: 46.6 mg/dL — AB (ref 0.0–40.0)

## 2013-08-13 LAB — TSH: TSH: 5.77 u[IU]/mL — AB (ref 0.35–4.50)

## 2013-08-13 MED ORDER — PROMETHAZINE-CODEINE 6.25-10 MG/5ML PO SYRP: 5.0000 mL | ORAL_SOLUTION | ORAL | Status: DC | PRN

## 2013-08-13 MED ORDER — FEXOFENADINE HCL 180 MG PO TABS: 180.0000 mg | ORAL_TABLET | Freq: Every day | ORAL | Status: DC

## 2013-08-13 MED ORDER — METHYLPREDNISOLONE ACETATE 80 MG/ML IJ SUSP
80.0000 mg | Freq: Once | INTRAMUSCULAR | Status: AC
  Administered 2013-08-13: 80 mg via INTRAMUSCULAR

## 2013-08-13 NOTE — Patient Instructions (Addendum)
You had the steroid shot today  Please take all new medication as prescribed - the allegra for allergies  Please continue all other medications as before, and refills have been done if requested. Please have the pharmacy call with any other refills you may need.  Please continue your efforts at being more active, low cholesterol diet, and weight control.  You are otherwise up to date with prevention measures today.  Please go to the LAB in the Basement (turn left off the elevator) for the tests to be done today  You will be contacted by phone if any changes need to be made immediately.  Otherwise, you will receive a letter about your results with an explanation, but please check with MyChart first.  Please remember to sign up for MyChart if you have not done so, as this will be important to you in the future with finding out test results, communicating by private email, and scheduling acute appointments online when needed.   Please return in 6 months, or sooner if needed

## 2013-08-13 NOTE — Assessment & Plan Note (Signed)
Mild to mod, for depomedrol IM, allegra prn,  to f/u any worsening symptoms or concerns 
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
## 2013-08-13 NOTE — Assessment & Plan Note (Signed)

## 2013-08-13 NOTE — Progress Notes (Signed)
Pre visit review using our clinic review tool, if applicable. No additional management support is needed unless otherwise documented below in the visit note. 

## 2013-08-26 ENCOUNTER — Encounter: Payer: Self-pay | Admitting: Internal Medicine

## 2013-08-27 MED ORDER — TRAMADOL HCL 50 MG PO TABS: ORAL_TABLET | ORAL | Status: DC

## 2013-08-27 NOTE — Telephone Encounter (Signed)
Done hardcopy to robin  

## 2013-08-27 NOTE — Addendum Note (Signed)
Addended by: Biagio Borg on: 08/27/2013 12:56 PM   Modules accepted: Orders

## 2013-08-28 ENCOUNTER — Other Ambulatory Visit: Payer: Self-pay

## 2013-08-28 NOTE — Telephone Encounter (Signed)
Faxed hardcopy of Tramadol 50 mg #360 with 1 refill to [facility] per email request by patient.

## 2013-10-06 ENCOUNTER — Other Ambulatory Visit: Payer: Self-pay | Admitting: Internal Medicine

## 2013-10-10 ENCOUNTER — Other Ambulatory Visit (HOSPITAL_COMMUNITY): Payer: Self-pay | Admitting: Radiology

## 2013-10-10 DIAGNOSIS — I6529 Occlusion and stenosis of unspecified carotid artery: Secondary | ICD-10-CM

## 2013-10-21 ENCOUNTER — Ambulatory Visit (HOSPITAL_COMMUNITY): Payer: Medicare HMO | Attending: Cardiology | Admitting: Cardiology

## 2013-10-21 DIAGNOSIS — R0989 Other specified symptoms and signs involving the circulatory and respiratory systems: Secondary | ICD-10-CM | POA: Diagnosis not present

## 2013-10-21 DIAGNOSIS — I6529 Occlusion and stenosis of unspecified carotid artery: Secondary | ICD-10-CM

## 2013-10-21 DIAGNOSIS — I2510 Atherosclerotic heart disease of native coronary artery without angina pectoris: Secondary | ICD-10-CM | POA: Diagnosis not present

## 2013-10-21 DIAGNOSIS — Z87891 Personal history of nicotine dependence: Secondary | ICD-10-CM | POA: Insufficient documentation

## 2013-10-21 DIAGNOSIS — I749 Embolism and thrombosis of unspecified artery: Secondary | ICD-10-CM | POA: Diagnosis not present

## 2013-10-21 DIAGNOSIS — E785 Hyperlipidemia, unspecified: Secondary | ICD-10-CM | POA: Insufficient documentation

## 2013-10-21 DIAGNOSIS — I10 Essential (primary) hypertension: Secondary | ICD-10-CM | POA: Insufficient documentation

## 2013-10-21 NOTE — Progress Notes (Signed)
Carotid duplex performed 

## 2013-11-15 ENCOUNTER — Other Ambulatory Visit: Payer: Self-pay | Admitting: Internal Medicine

## 2013-11-15 NOTE — Telephone Encounter (Signed)
Done hardcopy to robin  

## 2013-11-15 NOTE — Telephone Encounter (Signed)
Faxed hardcopy to [facility] [address] 

## 2013-12-06 ENCOUNTER — Encounter: Payer: Self-pay | Admitting: Gastroenterology

## 2013-12-16 ENCOUNTER — Ambulatory Visit: Payer: Commercial Managed Care - HMO | Admitting: Cardiology

## 2013-12-31 ENCOUNTER — Emergency Department (HOSPITAL_COMMUNITY): Payer: Medicare HMO

## 2013-12-31 ENCOUNTER — Emergency Department (HOSPITAL_COMMUNITY)
Admission: EM | Admit: 2013-12-31 | Discharge: 2014-01-01 | Disposition: A | Discharge status: Home / Self Care | Payer: Medicare HMO | Attending: Emergency Medicine | Admitting: Emergency Medicine

## 2013-12-31 ENCOUNTER — Encounter (HOSPITAL_COMMUNITY): Payer: Self-pay | Admitting: Emergency Medicine

## 2013-12-31 DIAGNOSIS — I509 Heart failure, unspecified: Secondary | ICD-10-CM | POA: Diagnosis not present

## 2013-12-31 DIAGNOSIS — F419 Anxiety disorder, unspecified: Secondary | ICD-10-CM | POA: Insufficient documentation

## 2013-12-31 DIAGNOSIS — Z7982 Long term (current) use of aspirin: Secondary | ICD-10-CM | POA: Insufficient documentation

## 2013-12-31 DIAGNOSIS — K219 Gastro-esophageal reflux disease without esophagitis: Secondary | ICD-10-CM | POA: Insufficient documentation

## 2013-12-31 DIAGNOSIS — E785 Hyperlipidemia, unspecified: Secondary | ICD-10-CM | POA: Diagnosis not present

## 2013-12-31 DIAGNOSIS — Z87448 Personal history of other diseases of urinary system: Secondary | ICD-10-CM | POA: Insufficient documentation

## 2013-12-31 DIAGNOSIS — Z791 Long term (current) use of non-steroidal anti-inflammatories (NSAID): Secondary | ICD-10-CM | POA: Diagnosis not present

## 2013-12-31 DIAGNOSIS — I10 Essential (primary) hypertension: Secondary | ICD-10-CM | POA: Insufficient documentation

## 2013-12-31 DIAGNOSIS — G608 Other hereditary and idiopathic neuropathies: Secondary | ICD-10-CM | POA: Insufficient documentation

## 2013-12-31 DIAGNOSIS — R112 Nausea with vomiting, unspecified: Secondary | ICD-10-CM | POA: Diagnosis not present

## 2013-12-31 DIAGNOSIS — Z872 Personal history of diseases of the skin and subcutaneous tissue: Secondary | ICD-10-CM | POA: Insufficient documentation

## 2013-12-31 DIAGNOSIS — Z79899 Other long term (current) drug therapy: Secondary | ICD-10-CM | POA: Diagnosis not present

## 2013-12-31 DIAGNOSIS — I2510 Atherosclerotic heart disease of native coronary artery without angina pectoris: Secondary | ICD-10-CM | POA: Insufficient documentation

## 2013-12-31 DIAGNOSIS — Z87891 Personal history of nicotine dependence: Secondary | ICD-10-CM | POA: Insufficient documentation

## 2013-12-31 DIAGNOSIS — F329 Major depressive disorder, single episode, unspecified: Secondary | ICD-10-CM | POA: Insufficient documentation

## 2013-12-31 DIAGNOSIS — M1990 Unspecified osteoarthritis, unspecified site: Secondary | ICD-10-CM | POA: Diagnosis not present

## 2013-12-31 DIAGNOSIS — I252 Old myocardial infarction: Secondary | ICD-10-CM | POA: Insufficient documentation

## 2013-12-31 DIAGNOSIS — R531 Weakness: Secondary | ICD-10-CM | POA: Diagnosis present

## 2013-12-31 MED ORDER — SODIUM CHLORIDE 0.9 % IV SOLN
Freq: Once | INTRAVENOUS | Status: AC
  Administered 2013-12-31: 50 mL/h via INTRAVENOUS

## 2013-12-31 NOTE — ED Notes (Signed)
Per EMS, Patient called after taking a nap, he woke up with dizziness, nausea, and vomiting. Patient's BP at time of arrival 200/102. Patient fell asleep at 1800 and woke up around 1830 with symptoms. Vitals per EMS: 55 HR, 16 RR, 97%
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
## 2014-01-01 ENCOUNTER — Encounter (HOSPITAL_COMMUNITY): Payer: Self-pay

## 2014-01-01 ENCOUNTER — Emergency Department (HOSPITAL_COMMUNITY): Payer: Medicare HMO

## 2014-01-01 LAB — I-STAT CHEM 8, ED
BUN: 16 mg/dL (ref 6–23)
CALCIUM ION: 1.17 mmol/L (ref 1.13–1.30)
CREATININE: 1.2 mg/dL (ref 0.50–1.35)
Chloride: 104 mEq/L (ref 96–112)
Glucose, Bld: 131 mg/dL — ABNORMAL HIGH (ref 70–99)
HCT: 37 % — ABNORMAL LOW (ref 39.0–52.0)
Hemoglobin: 12.6 g/dL — ABNORMAL LOW (ref 13.0–17.0)
Potassium: 4.1 mEq/L (ref 3.7–5.3)
Sodium: 139 mEq/L (ref 137–147)
TCO2: 23 mmol/L (ref 0–100)

## 2014-01-01 LAB — I-STAT TROPONIN, ED: Troponin i, poc: 0.01 ng/mL (ref 0.00–0.08)

## 2014-01-01 LAB — HEPATIC FUNCTION PANEL
ALK PHOS: 85 U/L (ref 39–117)
ALT: 18 U/L (ref 0–53)
AST: 19 U/L (ref 0–37)
Albumin: 3.5 g/dL (ref 3.5–5.2)
BILIRUBIN TOTAL: 0.3 mg/dL (ref 0.3–1.2)
Total Protein: 7 g/dL (ref 6.0–8.3)

## 2014-01-01 LAB — CBC WITH DIFFERENTIAL/PLATELET
BASOS ABS: 0 10*3/uL (ref 0.0–0.1)
Basophils Relative: 0 % (ref 0–1)
EOS PCT: 3 % (ref 0–5)
Eosinophils Absolute: 0.3 10*3/uL (ref 0.0–0.7)
HEMATOCRIT: 35.4 % — AB (ref 39.0–52.0)
Hemoglobin: 12 g/dL — ABNORMAL LOW (ref 13.0–17.0)
Lymphocytes Relative: 15 % (ref 12–46)
Lymphs Abs: 1.3 10*3/uL (ref 0.7–4.0)
MCH: 29.5 pg (ref 26.0–34.0)
MCHC: 33.9 g/dL (ref 30.0–36.0)
MCV: 87 fL (ref 78.0–100.0)
MONO ABS: 0.4 10*3/uL (ref 0.1–1.0)
Monocytes Relative: 5 % (ref 3–12)
Neutro Abs: 6.6 10*3/uL (ref 1.7–7.7)
Neutrophils Relative %: 77 % (ref 43–77)
Platelets: 143 10*3/uL — ABNORMAL LOW (ref 150–400)
RBC: 4.07 MIL/uL — ABNORMAL LOW (ref 4.22–5.81)
RDW: 12.7 % (ref 11.5–15.5)
WBC: 8.6 10*3/uL (ref 4.0–10.5)

## 2014-01-01 LAB — URINALYSIS, ROUTINE W REFLEX MICROSCOPIC
Bilirubin Urine: NEGATIVE
Glucose, UA: NEGATIVE mg/dL
Hgb urine dipstick: NEGATIVE
Ketones, ur: NEGATIVE mg/dL
Leukocytes, UA: NEGATIVE
NITRITE: NEGATIVE
Protein, ur: NEGATIVE mg/dL
SPECIFIC GRAVITY, URINE: 1.018 (ref 1.005–1.030)
Urobilinogen, UA: 1 mg/dL (ref 0.0–1.0)
pH: 7 (ref 5.0–8.0)

## 2014-01-01 LAB — LIPASE, BLOOD: Lipase: 26 U/L (ref 11–59)

## 2014-01-01 NOTE — ED Notes (Signed)
Discharge instructions reviewed with pt. Pt verbalized understanding.   

## 2014-01-01 NOTE — Discharge Instructions (Signed)
Nausea and Vomiting Nausea means you feel sick to your stomach. Throwing up (vomiting) is a reflex where stomach contents come out of your mouth. HOME CARE   Take medicine as told by your doctor.  Do not force yourself to eat. However, you do need to drink fluids.  If you feel like eating, eat a normal diet as told by your doctor.  Eat rice, wheat, potatoes, bread, lean meats, yogurt, fruits, and vegetables.  Avoid high-fat foods.  Drink enough fluids to keep your pee (urine) clear or pale yellow.  Ask your doctor how to replace body fluid losses (rehydrate). Signs of body fluid loss (dehydration) include:  Feeling very thirsty.  Dry lips and mouth.  Feeling dizzy.  Dark pee.  Peeing less than normal.  Feeling confused.  Fast breathing or heart rate. GET HELP RIGHT AWAY IF:   You have blood in your throw up.  You have black or bloody poop (stool).  You have a bad headache or stiff neck.  You feel confused.  You have bad belly (abdominal) pain.  You have chest pain or trouble breathing.  You do not pee at least once every 8 hours.  You have cold, clammy skin.  You keep throwing up after 24 to 48 hours.  You have a fever. MAKE SURE YOU:   Understand these instructions.  Will watch your condition.  Will get help right away if you are not doing well or get worse. Document Released: 08/31/2007 Document Revised: 06/06/2011 Document Reviewed: 08/13/2010 [facility] Patient Information 2015 [place], [place]. This information is not intended to replace advice given to you by your health care provider. Make sure you discuss any questions you have with your health care provider. Please make an appointment with your CP for follow up care on regular basis  Tonight your evaluation included cardiac evaluation, head CT Scan EKG and lab work  All within normal linitis You received IV fluids and antiemetics with resolution of your symptoms

## 2014-01-02 ENCOUNTER — Encounter: Payer: Self-pay | Admitting: *Deleted

## 2014-01-02 ENCOUNTER — Encounter: Payer: Self-pay | Admitting: Cardiology

## 2014-01-02 ENCOUNTER — Ambulatory Visit (INDEPENDENT_AMBULATORY_CARE_PROVIDER_SITE_OTHER): Payer: Commercial Managed Care - HMO | Admitting: Cardiology

## 2014-01-02 VITALS — BP 140/60 | HR 50 | Ht 70.0 in | Wt 289.2 lb

## 2014-01-02 DIAGNOSIS — I10 Essential (primary) hypertension: Secondary | ICD-10-CM

## 2014-01-02 DIAGNOSIS — I2510 Atherosclerotic heart disease of native coronary artery without angina pectoris: Secondary | ICD-10-CM

## 2014-01-02 LAB — URINE CULTURE
Colony Count: NO GROWTH
Culture: NO GROWTH

## 2014-01-02 NOTE — Assessment & Plan Note (Signed)
No evidence of aneurysm on most recent abdominal ultrasound.

## 2014-01-02 NOTE — Assessment & Plan Note (Signed)
Blood pressure controlled. Continue present medications. 

## 2014-01-02 NOTE — Assessment & Plan Note (Signed)
Continue aspirin and statin. Recent nuclear study showed no ischemia but LV function mildly decreased. Schedule echocardiogram to better assess.

## 2014-01-02 NOTE — Patient Instructions (Signed)

## 2014-01-02 NOTE — Assessment & Plan Note (Signed)
Continue aspirin and statin. Follow-up carotid Dopplers July 2016. 

## 2014-01-02 NOTE — Assessment & Plan Note (Signed)
Continue statin. 
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
## 2014-01-03 NOTE — ED Provider Notes (Signed)
Medical screening examination/treatment/procedure(s) were performed by non-physician practitioner and as supervising physician I was immediately available for consultation/collaboration.   EKG Interpretation   Date/Time:  Tuesday December 31 2013 22:16:07 EDT Ventricular Rate:  53 PR Interval:  205 QRS Duration: 103 QT Interval:  450 QTC Calculation: 422 R Axis:   47 Text Interpretation:  Sinus rhythm Inferior Q waves noted When compared  with ECG of 01/07/2013 No significant change was found Confirmed by  [facility]  MD, Garron Eline ([id number]) on 12/31/2013 11:11:26 PM        Francine Graven, DO 01/03/14 1157

## 2014-01-06 ENCOUNTER — Ambulatory Visit (HOSPITAL_COMMUNITY)
Admission: RE | Admit: 2014-01-06 | Discharge: 2014-01-06 | Disposition: A | Discharge status: Home / Self Care | Payer: Medicare HMO | Source: Ambulatory Visit | Attending: Cardiovascular Disease | Admitting: Cardiovascular Disease

## 2014-01-06 DIAGNOSIS — E785 Hyperlipidemia, unspecified: Secondary | ICD-10-CM | POA: Insufficient documentation

## 2014-01-06 DIAGNOSIS — I10 Essential (primary) hypertension: Secondary | ICD-10-CM | POA: Insufficient documentation

## 2014-01-06 DIAGNOSIS — I2510 Atherosclerotic heart disease of native coronary artery without angina pectoris: Secondary | ICD-10-CM | POA: Diagnosis present

## 2014-01-06 DIAGNOSIS — Z87891 Personal history of nicotine dependence: Secondary | ICD-10-CM | POA: Diagnosis not present

## 2014-01-06 DIAGNOSIS — I517 Cardiomegaly: Secondary | ICD-10-CM

## 2014-01-06 NOTE — Progress Notes (Signed)
2D Echocardiogram Complete.  01/06/2014   Steven Charles, [place]

## 2014-02-13 ENCOUNTER — Encounter: Payer: Self-pay | Admitting: Internal Medicine

## 2014-02-13 ENCOUNTER — Ambulatory Visit (INDEPENDENT_AMBULATORY_CARE_PROVIDER_SITE_OTHER): Payer: Commercial Managed Care - HMO | Admitting: Internal Medicine

## 2014-02-13 VITALS — BP 140/70 | HR 60 | Temp 97.9°F | Ht 69.0 in | Wt 290.5 lb

## 2014-02-13 DIAGNOSIS — R7302 Impaired glucose tolerance (oral): Secondary | ICD-10-CM

## 2014-02-13 DIAGNOSIS — F32A Depression, unspecified: Secondary | ICD-10-CM

## 2014-02-13 DIAGNOSIS — F329 Major depressive disorder, single episode, unspecified: Secondary | ICD-10-CM

## 2014-02-13 DIAGNOSIS — I10 Essential (primary) hypertension: Secondary | ICD-10-CM

## 2014-02-13 DIAGNOSIS — Z23 Encounter for immunization: Secondary | ICD-10-CM

## 2014-02-13 DIAGNOSIS — Z0000 Encounter for general adult medical examination without abnormal findings: Secondary | ICD-10-CM

## 2014-02-13 DIAGNOSIS — Z0189 Encounter for other specified special examinations: Secondary | ICD-10-CM

## 2014-02-13 DIAGNOSIS — E785 Hyperlipidemia, unspecified: Secondary | ICD-10-CM

## 2014-02-13 MED ORDER — FLUOXETINE HCL 40 MG PO CAPS: 40.0000 mg | ORAL_CAPSULE | Freq: Every day | ORAL | Status: DC

## 2014-02-13 NOTE — Assessment & Plan Note (Signed)
stable overall by history and exam, recent data reviewed with pt, and pt to continue medical treatment as before,  to f/u any worsening symptoms or concerns Lab Results  Component Value Date   LDLCALC 74 08/13/2013

## 2014-02-13 NOTE — Assessment & Plan Note (Signed)
stable overall by history and exam, recent data reviewed with pt, and pt to continue medical treatment as before,  to f/u any worsening symptoms or concerns Lab Results  Component Value Date   HGBA1C 6.1 08/13/2013

## 2014-02-13 NOTE — Patient Instructions (Signed)
You had the flu shot today  OK to increase the prozac (fluoxetine) to 40 mg per day  Please continue all other medications as before, and refills have been done if requested.  Please have the pharmacy call with any other refills you may need.  Please continue your efforts at being more active, low cholesterol diet, and weight control.  Please keep your appointments with your specialists as you may have planned  Please return in 6 months, or sooner if needed, with Lab testing done 3-5 days before

## 2014-02-13 NOTE — Progress Notes (Signed)
Pre visit review using our clinic review tool, if applicable. No additional management support is needed unless otherwise documented below in the visit note. 

## 2014-02-13 NOTE — Assessment & Plan Note (Signed)
Mild to mod, for increased prozac to 40 qd,  to f/u any worsening symptoms or concerns, verified no SI,  Declines counseling
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
## 2014-02-13 NOTE — Assessment & Plan Note (Signed)
stable overall by history and exam, recent data reviewed with pt, and pt to continue medical treatment as before,  to f/u any worsening symptoms or concerns BP Readings from Last 3 Encounters:  02/13/14 140/70  01/02/14 140/60  01/01/14 144/58

## 2014-02-14 ENCOUNTER — Telehealth: Payer: Self-pay | Admitting: Internal Medicine

## 2014-02-14 NOTE — Telephone Encounter (Signed)
emmi emailed °

## 2014-03-26 ENCOUNTER — Telehealth: Payer: Self-pay | Admitting: Cardiology

## 2014-03-26 NOTE — Telephone Encounter (Signed)
Mr.Grajales is calling because he is in the donut hole and is asking for samples of Crestor 20 mgs. Please call.. Thanks

## 2014-03-26 NOTE — Telephone Encounter (Signed)
Pt. Has samples up front

## 2014-04-07 ENCOUNTER — Other Ambulatory Visit: Payer: Self-pay | Admitting: Internal Medicine

## 2014-04-08 MED ORDER — TRAMADOL HCL 50 MG PO TABS: 50.0000 mg | ORAL_TABLET | Freq: Four times a day (QID) | ORAL | Status: DC | PRN

## 2014-04-08 NOTE — Telephone Encounter (Signed)
Script faxed to [facility]. JG//CMA

## 2014-04-08 NOTE — Telephone Encounter (Signed)
Done hardcopy to [employer]

## 2014-04-15 ENCOUNTER — Other Ambulatory Visit: Payer: Self-pay | Admitting: Internal Medicine

## 2014-04-15 NOTE — Telephone Encounter (Signed)
Done hardcopy to steph 

## 2014-04-15 NOTE — Telephone Encounter (Signed)
Faxed to [facility]. 

## 2014-04-16 ENCOUNTER — Other Ambulatory Visit: Payer: Self-pay | Admitting: Internal Medicine

## 2014-04-21 ENCOUNTER — Telehealth: Payer: Self-pay | Admitting: Internal Medicine

## 2014-04-21 NOTE — Telephone Encounter (Signed)
Patient called requesting that we fax [facility] list [facility]. Faxed per his request

## 2014-06-16 ENCOUNTER — Other Ambulatory Visit: Payer: Self-pay | Admitting: Internal Medicine

## 2014-07-14 ENCOUNTER — Other Ambulatory Visit: Payer: Self-pay | Admitting: Internal Medicine

## 2014-07-18 ENCOUNTER — Encounter: Payer: Self-pay | Admitting: Cardiology

## 2014-07-18 ENCOUNTER — Other Ambulatory Visit: Payer: Self-pay | Admitting: Internal Medicine

## 2014-07-18 ENCOUNTER — Ambulatory Visit (INDEPENDENT_AMBULATORY_CARE_PROVIDER_SITE_OTHER): Payer: Commercial Managed Care - HMO | Admitting: Cardiology

## 2014-07-18 VITALS — BP 124/76 | HR 59 | Ht 70.0 in | Wt 286.0 lb

## 2014-07-18 DIAGNOSIS — I5021 Acute systolic (congestive) heart failure: Secondary | ICD-10-CM

## 2014-07-18 DIAGNOSIS — I10 Essential (primary) hypertension: Secondary | ICD-10-CM | POA: Diagnosis not present

## 2014-07-18 DIAGNOSIS — I679 Cerebrovascular disease, unspecified: Secondary | ICD-10-CM

## 2014-07-18 DIAGNOSIS — E785 Hyperlipidemia, unspecified: Secondary | ICD-10-CM | POA: Diagnosis not present

## 2014-07-18 MED ORDER — LORAZEPAM 1 MG PO TABS: 1.0000 mg | ORAL_TABLET | Freq: Three times a day (TID) | ORAL | Status: DC | PRN

## 2014-07-18 NOTE — Assessment & Plan Note (Signed)
Continue aspirin and statin. Follow-up carotid Dopplers July 2016.

## 2014-07-18 NOTE — Patient Instructions (Addendum)
Your physician wants you to follow-up in: [place] will receive a reminder letter in the mail two months in advance. If you don't receive a letter, please call our office to schedule the follow-up appointment.   Your physician has requested that you have a carotid duplex. This test is an ultrasound of the carotid arteries in your neck. It looks at blood flow through these arteries that supply the brain with blood. Allow one hour for this exam. There are no restrictions or special instructions.DUE IN [place]

## 2014-07-18 NOTE — Telephone Encounter (Signed)
Done hardcopy to Cherina  

## 2014-07-18 NOTE — Telephone Encounter (Signed)
Faxed script back to [facility].../lmb 

## 2014-07-18 NOTE — Assessment & Plan Note (Signed)
Continue statin. 

## 2014-07-18 NOTE — Assessment & Plan Note (Signed)
Blood pressure controlled. Continue present medications. 
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
## 2014-07-18 NOTE — Assessment & Plan Note (Signed)
Continue aspirin and statin. 

## 2014-07-21 ENCOUNTER — Encounter: Payer: Self-pay | Admitting: Internal Medicine

## 2014-07-22 MED ORDER — DICLOFENAC SODIUM 75 MG PO TBEC: 75.0000 mg | DELAYED_RELEASE_TABLET | Freq: Two times a day (BID) | ORAL | Status: DC

## 2014-07-22 NOTE — Telephone Encounter (Signed)
Medication has been d/c is this ok to refill...Johny Chess

## 2014-07-22 NOTE — Telephone Encounter (Signed)
Done erx - but needs to stop the mobic if taking the diclofenac

## 2014-08-14 ENCOUNTER — Other Ambulatory Visit (INDEPENDENT_AMBULATORY_CARE_PROVIDER_SITE_OTHER): Payer: Commercial Managed Care - HMO

## 2014-08-14 ENCOUNTER — Ambulatory Visit (INDEPENDENT_AMBULATORY_CARE_PROVIDER_SITE_OTHER): Payer: Commercial Managed Care - HMO | Admitting: Internal Medicine

## 2014-08-14 ENCOUNTER — Encounter: Payer: Self-pay | Admitting: Internal Medicine

## 2014-08-14 VITALS — BP 136/84 | HR 54 | Temp 98.3°F

## 2014-08-14 DIAGNOSIS — Z0000 Encounter for general adult medical examination without abnormal findings: Secondary | ICD-10-CM

## 2014-08-14 DIAGNOSIS — K219 Gastro-esophageal reflux disease without esophagitis: Secondary | ICD-10-CM | POA: Diagnosis not present

## 2014-08-14 DIAGNOSIS — R7302 Impaired glucose tolerance (oral): Secondary | ICD-10-CM

## 2014-08-14 DIAGNOSIS — R7989 Other specified abnormal findings of blood chemistry: Secondary | ICD-10-CM | POA: Diagnosis not present

## 2014-08-14 LAB — URINALYSIS, ROUTINE W REFLEX MICROSCOPIC
BILIRUBIN URINE: NEGATIVE
HGB URINE DIPSTICK: NEGATIVE
KETONES UR: NEGATIVE
LEUKOCYTES UA: NEGATIVE
Nitrite: NEGATIVE
RBC / HPF: NONE SEEN (ref 0–?)
SPECIFIC GRAVITY, URINE: 1.02 (ref 1.000–1.030)
Total Protein, Urine: NEGATIVE
URINE GLUCOSE: NEGATIVE
UROBILINOGEN UA: 0.2 (ref 0.0–1.0)
WBC, UA: NONE SEEN (ref 0–?)
pH: 6 (ref 5.0–8.0)

## 2014-08-14 LAB — CBC WITH DIFFERENTIAL/PLATELET
BASOS ABS: 0 10*3/uL (ref 0.0–0.1)
Basophils Relative: 0.6 % (ref 0.0–3.0)
Eosinophils Absolute: 0.3 10*3/uL (ref 0.0–0.7)
Eosinophils Relative: 3.6 % (ref 0.0–5.0)
HEMATOCRIT: 37.8 % — AB (ref 39.0–52.0)
Hemoglobin: 12.9 g/dL — ABNORMAL LOW (ref 13.0–17.0)
LYMPHS ABS: 1.9 10*3/uL (ref 0.7–4.0)
Lymphocytes Relative: 26.6 % (ref 12.0–46.0)
MCHC: 34.1 g/dL (ref 30.0–36.0)
MCV: 86.4 fl (ref 78.0–100.0)
MONO ABS: 0.6 10*3/uL (ref 0.1–1.0)
MONOS PCT: 8.9 % (ref 3.0–12.0)
Neutro Abs: 4.3 10*3/uL (ref 1.4–7.7)
Neutrophils Relative %: 60.3 % (ref 43.0–77.0)
Platelets: 160 10*3/uL (ref 150.0–400.0)
RBC: 4.38 Mil/uL (ref 4.22–5.81)
RDW: 13.9 % (ref 11.5–15.5)
WBC: 7.2 10*3/uL (ref 4.0–10.5)

## 2014-08-14 LAB — HEMOGLOBIN A1C: Hgb A1c MFr Bld: 5.9 % (ref 4.6–6.5)

## 2014-08-14 LAB — LDL CHOLESTEROL, DIRECT: Direct LDL: 95 mg/dL

## 2014-08-14 LAB — HEPATIC FUNCTION PANEL
ALBUMIN: 4.2 g/dL (ref 3.5–5.2)
ALK PHOS: 85 U/L (ref 39–117)
ALT: 18 U/L (ref 0–53)
AST: 17 U/L (ref 0–37)
Bilirubin, Direct: 0.1 mg/dL (ref 0.0–0.3)
TOTAL PROTEIN: 6.9 g/dL (ref 6.0–8.3)
Total Bilirubin: 0.5 mg/dL (ref 0.2–1.2)

## 2014-08-14 LAB — LIPID PANEL
Cholesterol: 159 mg/dL (ref 0–200)
HDL: 36.1 mg/dL — AB (ref >39.00)
NONHDL: 122.9
Total CHOL/HDL Ratio: 4
Triglycerides: 250 mg/dL — ABNORMAL HIGH (ref 0.0–149.0)
VLDL: 50 mg/dL — ABNORMAL HIGH (ref 0.0–40.0)

## 2014-08-14 LAB — BASIC METABOLIC PANEL
BUN: 19 mg/dL (ref 6–23)
CO2: 30 meq/L (ref 19–32)
Calcium: 9.6 mg/dL (ref 8.4–10.5)
Chloride: 103 mEq/L (ref 96–112)
Creatinine, Ser: 1.24 mg/dL (ref 0.40–1.50)
GFR: 61.08 mL/min (ref >60.00)
Glucose, Bld: 103 mg/dL — ABNORMAL HIGH (ref 70–99)
Potassium: 4.6 mEq/L (ref 3.5–5.1)
SODIUM: 137 meq/L (ref 135–145)

## 2014-08-14 LAB — PSA: PSA: 0.31 ng/mL (ref 0.10–4.00)

## 2014-08-14 MED ORDER — FLUOXETINE HCL 40 MG PO CAPS: 40.0000 mg | ORAL_CAPSULE | Freq: Every day | ORAL | Status: DC

## 2014-08-14 MED ORDER — TAMSULOSIN HCL 0.4 MG PO CAPS: 0.4000 mg | ORAL_CAPSULE | Freq: Two times a day (BID) | ORAL | Status: DC

## 2014-08-14 MED ORDER — LISINOPRIL 20 MG PO TABS: 20.0000 mg | ORAL_TABLET | Freq: Every day | ORAL | Status: DC

## 2014-08-14 MED ORDER — GABAPENTIN 300 MG PO CAPS: 300.0000 mg | ORAL_CAPSULE | Freq: Two times a day (BID) | ORAL | Status: DC

## 2014-08-14 MED ORDER — FUROSEMIDE 40 MG PO TABS: 20.0000 mg | ORAL_TABLET | Freq: Two times a day (BID) | ORAL | Status: DC

## 2014-08-14 MED ORDER — CARVEDILOL 12.5 MG PO TABS: 12.5000 mg | ORAL_TABLET | Freq: Two times a day (BID) | ORAL | Status: DC

## 2014-08-14 MED ORDER — PANTOPRAZOLE SODIUM 40 MG PO TBEC: 40.0000 mg | DELAYED_RELEASE_TABLET | Freq: Two times a day (BID) | ORAL | Status: DC
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
## 2014-08-14 NOTE — Assessment & Plan Note (Signed)

## 2014-08-14 NOTE — Patient Instructions (Addendum)
Please continue all other medications as before, and refills have been done if requested.  Please have the pharmacy call with any other refills you may need.  Please continue your efforts at being more active, low cholesterol diet, and weight control.  You are otherwise up to date with prevention measures today.  Please keep your appointments with your specialists as you may have planned  You will be contacted regarding the referral for: GI  Please go to the LAB in the Basement (turn left off the elevator) for the tests to be done today  You will be contacted by phone if any changes need to be made immediately.  Otherwise, you will receive a letter about your results with an explanation, but please check with MyChart first.  Please remember to sign up for MyChart if you have not done so, as this will be important to you in the future with finding out test results, communicating by private email, and scheduling acute appointments online when needed.  Please return in 6 months, or sooner if needed

## 2014-08-14 NOTE — Assessment & Plan Note (Signed)
stable overall by history and exam, recent data reviewed with pt, and pt to continue medical treatment as before,  to f/u any worsening symptoms or concerns Lab Results  Component Value Date   HGBA1C 6.1 08/13/2013   For f/u a1c. Work on diet, wt loss

## 2014-08-14 NOTE — Assessment & Plan Note (Signed)
Also for GI referral per pt request

## 2014-08-14 NOTE — Progress Notes (Signed)
Pre visit review using our clinic review tool, if applicable. No additional management support is needed unless otherwise documented below in the visit note. 

## 2014-08-15 ENCOUNTER — Encounter: Payer: Self-pay | Admitting: Gastroenterology

## 2014-08-21 ENCOUNTER — Other Ambulatory Visit: Payer: Self-pay | Admitting: Internal Medicine

## 2014-08-26 MED ORDER — TRAMADOL HCL 50 MG PO TABS: 50.0000 mg | ORAL_TABLET | Freq: Four times a day (QID) | ORAL | Status: DC | PRN

## 2014-08-26 NOTE — Addendum Note (Signed)
Addended by: Lyman Bishop on: 08/26/2014 09:09 AM   Modules accepted: Orders

## 2014-08-26 NOTE — Telephone Encounter (Signed)
Rx faxed to pharmacy  

## 2014-09-03 ENCOUNTER — Telehealth: Payer: Self-pay | Admitting: Cardiology

## 2014-09-03 NOTE — Telephone Encounter (Signed)
Pt called in stating that he received a letter from [facility] stating that his appt with Dr.Crenshaw was "not approved" so therefore his insurance will not cover the visit unless Dr. Stanford Breed sends a letter to [employer] explaining why he needed to come in for the visit. Please f/u with the patient.   Thanks

## 2014-09-03 NOTE — Telephone Encounter (Signed)
Steven Charles, Please send letter of necessity stating patient seen for CP and dyspnea. [name]

## 2014-09-03 NOTE — Telephone Encounter (Signed)
Spoke with pt and he states that [facility] sent a letter stating that [place] on 07/18/14 was denied. Pt states that he needs a letter sent to the insurance company for an appeal. Pt states letter just needs to include why he went to see Dr. Stanford Breed (pt states he was seen because he was having CP). Informed pt that I would route this information to Dr. Stanford Breed and his nurse for review, advisement and follow up. Pt verbalized understanding and was in agreement with this plan.

## 2014-09-08 NOTE — Telephone Encounter (Signed)
Has this been taken care of?

## 2014-09-11 ENCOUNTER — Encounter: Payer: Self-pay | Admitting: *Deleted

## 2014-09-11 NOTE — Telephone Encounter (Signed)
Left message for patient, letter generated and placed in the mail to his home address.

## 2014-09-18 ENCOUNTER — Other Ambulatory Visit: Payer: Self-pay | Admitting: Internal Medicine

## 2014-09-18 NOTE — Telephone Encounter (Signed)
Done hardcopy to steph 

## 2014-09-19 NOTE — Telephone Encounter (Signed)
rx faxed

## 2014-10-16 ENCOUNTER — Ambulatory Visit (INDEPENDENT_AMBULATORY_CARE_PROVIDER_SITE_OTHER): Payer: Self-pay | Admitting: Gastroenterology

## 2014-10-16 ENCOUNTER — Other Ambulatory Visit: Payer: Self-pay | Admitting: Internal Medicine

## 2014-10-16 ENCOUNTER — Encounter: Payer: Self-pay | Admitting: Gastroenterology

## 2014-10-16 VITALS — BP 136/64 | HR 60 | Ht 68.5 in | Wt 282.1 lb

## 2014-10-16 DIAGNOSIS — K2270 Barrett's esophagus without dysplasia: Secondary | ICD-10-CM

## 2014-10-16 DIAGNOSIS — Z1211 Encounter for screening for malignant neoplasm of colon: Secondary | ICD-10-CM

## 2014-10-16 NOTE — Patient Instructions (Signed)
You have been scheduled for an endoscopy. Please follow written instructions given to you at your visit today. If you use inhalers (even only as needed), please bring them with you on the day of your procedure. Your physician has requested that you go to www.startemmi.com and enter the access code given to you at your visit today. This web site gives a general overview about your procedure. However, you should still follow specific instructions given to you by our office regarding your preparation for the procedure.  Normal BMI (Body Mass Index- based on height and weight) is between 23 and 30. Your BMI today is Body mass index is 42.27 kg/(m^2). Marland Kitchen Please consider follow up  regarding your BMI with your Primary Care Provider.  You will be due for a recall colonoscopy in 11/2015. We will send you a reminder in the mail when it gets closer to that time.  Thank you for choosing me and [facility].  Pricilla Riffle. Dagoberto Ligas., MD., Marval Regal  cc: Cathlean Cower, MD

## 2014-10-16 NOTE — Progress Notes (Signed)
    History of Present Illness: This is a 69 year old male referred by Biagio Borg, MD for the evaluation of Barretts. His last EGD by DRP was done in 04/2011. He is due for a 3 year surveillance EGD. GERD symptoms well controlled on pantoprazole. He thought he was having EGD today, stayed NPO and brought a driver. Denies weight loss, abdominal pain, constipation, diarrhea, change in stool caliber, melena, hematochezia, nausea, vomiting, dysphagia, chest pain.  Review of Systems: Pertinent positive and negative review of systems were noted in the above HPI section. All other review of systems were otherwise negative.  Current Medications, Allergies, Past Medical History, Past Surgical History, Family History and Social History were reviewed in [employer] record.  Physical Exam: General: Well developed, well nourished, no acute distress Head: Normocephalic and atraumatic Eyes:  sclerae anicteric, EOMI Ears: Normal auditory acuity Mouth: No deformity or lesions Neck: Supple, no masses or thyromegaly Lungs: Clear throughout to auscultation Heart: Regular rate and rhythm; no murmurs, rubs or bruits Abdomen: Soft, non tender and non distended. No masses, hepatosplenomegaly or hernias noted. Normal Bowel sounds Musculoskeletal: Symmetrical with no gross deformities  Skin: No lesions on visible extremities Pulses:  Normal pulses noted Extremities: No clubbing, cyanosis, edema or deformities noted Neurological: Alert oriented x 4, grossly nonfocal Cervical Nodes:  No significant cervical adenopathy Inguinal Nodes: No significant inguinal adenopathy Psychological:  Alert and cooperative. Normal mood and affect  Assessment and Recommendations:  1. Barretts esophagus. Remain on PPI qam. Schedule EGD. The risks (including bleeding, perforation, infection, missed lesions, medication reactions and possible hospitalization or surgery if complications occur), benefits, and  alternatives to endoscopy with possible biopsy and possible dilation were discussed with the patient and they consent to proceed. No charge for todays visit as he should have had a directly scheduled EGD.  2. CRC screening, average risk. Colonoscopy due 11/2015.   cc: Biagio Borg, MD [address] [place], [place] [address]

## 2014-10-17 ENCOUNTER — Inpatient Hospital Stay (HOSPITAL_COMMUNITY): Admission: RE | Admit: 2014-10-17 | Payer: Medicare HMO | Source: Ambulatory Visit

## 2014-10-17 DIAGNOSIS — M25572 Pain in left ankle and joints of left foot: Secondary | ICD-10-CM | POA: Diagnosis not present

## 2014-10-17 DIAGNOSIS — M25562 Pain in left knee: Secondary | ICD-10-CM | POA: Diagnosis not present

## 2014-11-04 ENCOUNTER — Ambulatory Visit (HOSPITAL_COMMUNITY)
Admission: RE | Admit: 2014-11-04 | Discharge: 2014-11-04 | Disposition: A | Discharge status: Home / Self Care | Payer: Commercial Managed Care - HMO | Source: Ambulatory Visit | Attending: Cardiology | Admitting: Cardiology

## 2014-11-04 DIAGNOSIS — I6523 Occlusion and stenosis of bilateral carotid arteries: Secondary | ICD-10-CM | POA: Diagnosis not present

## 2014-11-04 DIAGNOSIS — I679 Cerebrovascular disease, unspecified: Secondary | ICD-10-CM

## 2014-12-04 ENCOUNTER — Ambulatory Visit (AMBULATORY_SURGERY_CENTER): Payer: Commercial Managed Care - HMO | Admitting: Gastroenterology

## 2014-12-04 ENCOUNTER — Encounter: Payer: Self-pay | Admitting: Gastroenterology

## 2014-12-04 VITALS — BP 105/61 | HR 60 | Temp 97.6°F | Resp 19 | Ht 68.0 in | Wt 282.0 lb

## 2014-12-04 DIAGNOSIS — I10 Essential (primary) hypertension: Secondary | ICD-10-CM | POA: Diagnosis not present

## 2014-12-04 DIAGNOSIS — K2270 Barrett's esophagus without dysplasia: Secondary | ICD-10-CM | POA: Diagnosis present

## 2014-12-04 DIAGNOSIS — I509 Heart failure, unspecified: Secondary | ICD-10-CM | POA: Diagnosis not present

## 2014-12-04 MED ORDER — SODIUM CHLORIDE 0.9 % IV SOLN: 500.0000 mL | INTRAVENOUS | Status: DC

## 2014-12-04 NOTE — Op Note (Signed)
[place]  [address]. [place], [address]   ENDOSCOPY PROCEDURE REPORT  PATIENT: Steven Charles, Steven Charles  MR#: [id number] BIRTHDATE: 1946/01/26 , 69  yrs. old GENDER: male ENDOSCOPIST: Ladene Artist, MD, [facility] PROCEDURE DATE:  12/04/2014 PROCEDURE:  EGD w/ biopsy ASA CLASS:     Class III INDICATIONS:  history of Barrett's esophagus. MEDICATIONS: Monitored anesthesia care and Propofol 250 mg IV TOPICAL ANESTHETIC: none DESCRIPTION OF PROCEDURE: After the risks benefits and alternatives of the procedure were thoroughly explained, informed consent was obtained.  The LB [id number] endoscope was introduced through the mouth and advanced to the second portion of the duodenum , Without limitations.  The instrument was slowly withdrawn as the mucosa was fully examined.    ESOPHAGUS: There was a 8cm segment of Barrett's esophagus found in the distal esophagus.  The length of circumferential Barrett's was 6cm ([place] C6).  Barrett's Surveillance (4 quadrant biopsies). The esophagus otherwise appeared normal. STOMACH: The mucosa of the stomach appeared normal. DUODENUM: The duodenal mucosa showed no abnormalities in the bulb and 2nd part of the duodenum.  Retroflexed views revealed a small hiatal hernia.   The scope was then withdrawn from the patient and the procedure completed.  COMPLICATIONS: There were no immediate complications.  ENDOSCOPIC IMPRESSION: 1.   Barrett's esophagus in the distal esophagus; multiple biopsies performed 2.   Small hiatal hernia  RECOMMENDATIONS: 1.  Await pathology results 2.  Anti-reflux regimen long term 3.  Continue PPI bid long term 4.  Endoscopy in 1-2 years pending pathology review   eSigned:  Ladene Artist, MD, [facility] 12/04/2014 10:29 AM

## 2014-12-04 NOTE — Patient Instructions (Signed)
YOU HAD AN ENDOSCOPIC PROCEDURE TODAY AT [facility]:   Refer to the procedure report that was given to you for any specific questions about what was found during the examination.  If the procedure report does not answer your questions, please call your gastroenterologist to clarify.  If you requested that your care partner not be given the details of your procedure findings, then the procedure report has been included in a sealed envelope for you to review at your convenience later.  YOU SHOULD EXPECT: Some feelings of bloating in the abdomen. Passage of more gas than usual.  Walking can help get rid of the air that was put into your GI tract during the procedure and reduce the bloating. If you had a lower endoscopy (such as a colonoscopy or flexible sigmoidoscopy) you may notice spotting of blood in your stool or on the toilet paper. If you underwent a bowel prep for your procedure, you may not have a normal bowel movement for a few days.  Please Note:  You might notice some irritation and congestion in your nose or some drainage.  This is from the oxygen used during your procedure.  There is no need for concern and it should clear up in a day or so.  SYMPTOMS TO REPORT IMMEDIATELY:     Following upper endoscopy (EGD)  Vomiting of blood or coffee ground material  New chest pain or pain under the shoulder blades  Painful or persistently difficult swallowing  New shortness of breath  Fever of 100F or higher  Black, tarry-looking stools  For urgent or emergent issues, a gastroenterologist can be reached at any hour by calling [phone].   DIET: Your first meal following the procedure should be a small meal and then it is ok to progress to your normal diet. Heavy or fried foods are harder to digest and may make you feel nauseous or bloated.  Likewise, meals heavy in dairy and vegetables can increase bloating.  Drink plenty of fluids but you should avoid alcoholic beverages  for 24 hours.  ACTIVITY:  You should plan to take it easy for the rest of today and you should NOT DRIVE or use heavy machinery until tomorrow (because of the sedation medicines used during the test).    FOLLOW UP: Our staff will call the number listed on your records the next business day following your procedure to check on you and address any questions or concerns that you may have regarding the information given to you following your procedure. If we do not reach you, we will leave a message.  However, if you are feeling well and you are not experiencing any problems, there is no need to return our call.  We will assume that you have returned to your regular daily activities without incident.  If any biopsies were taken you will be contacted by phone or by letter within the next 1-3 weeks.  Please call us at [phone] if you have not heard about the biopsies in 3 weeks.    SIGNATURES/CONFIDENTIALITY: You and/or your care partner have signed paperwork which will be entered into your electronic medical record.  These signatures attest to the fact that that the information above on your After Visit Summary has been reviewed and is understood.  Full responsibility of the confidentiality of this discharge information lies with you and/or your care-partner.    Handouts were given to your care partner on barrett's esophagus, GERD and hiatal hernia. You may  resume your current medications today.  Continue taking Protonix 2 x daily long term. Await biopsy results. Please call if any questions or concerns.

## 2014-12-04 NOTE — Progress Notes (Signed)
Transferred to recovery room. A/O x3, pleased with MAC.  VSS.  Report to Annette, RN. 

## 2014-12-04 NOTE — Progress Notes (Signed)
No problems noted in the recovery room. maw 

## 2014-12-04 NOTE — Progress Notes (Signed)
Called to room to assist during endoscopic procedure.  Patient ID and intended procedure confirmed with present staff. Received instructions for my participation in the procedure from the performing physician.  

## 2014-12-08 ENCOUNTER — Telehealth: Payer: Self-pay

## 2014-12-08 NOTE — Telephone Encounter (Signed)
  Follow up Call-  Call back number 12/04/2014  Post procedure Call Back phone  # [phone]  Permission to leave phone message Yes     Patient questions:  Do you have a fever, pain , or abdominal swelling? No. Pain Score  0 *  Have you tolerated food without any problems? Yes.    Have you been able to return to your normal activities? Yes.    Do you have any questions about your discharge instructions: Diet   No. Medications  No. Follow up visit  No.  Do you have questions or concerns about your Care? No.  Actions: * If pain score is 4 or above: No action needed, pain <4.  No problems per the pt. maw

## 2014-12-09 ENCOUNTER — Encounter: Payer: Self-pay | Admitting: Gastroenterology

## 2014-12-10 DIAGNOSIS — Z0279 Encounter for issue of other medical certificate: Secondary | ICD-10-CM

## 2014-12-25 ENCOUNTER — Telehealth: Payer: Self-pay | Admitting: Cardiology

## 2014-12-25 DIAGNOSIS — E785 Hyperlipidemia, unspecified: Secondary | ICD-10-CM

## 2014-12-25 DIAGNOSIS — M12562 Traumatic arthropathy, left knee: Secondary | ICD-10-CM | POA: Diagnosis not present

## 2014-12-25 DIAGNOSIS — M25562 Pain in left knee: Secondary | ICD-10-CM | POA: Diagnosis not present

## 2014-12-25 MED ORDER — ATORVASTATIN CALCIUM 40 MG PO TABS: 40.0000 mg | ORAL_TABLET | Freq: Every day | ORAL | Status: DC

## 2014-12-25 NOTE — Telephone Encounter (Signed)
Steven Charles is calling because he is taking the Crestor 20 mg and wants to know if he can take the Atorvastatin , and will if be feasible for him to switch . Please call  Thanks

## 2014-12-25 NOTE — Telephone Encounter (Signed)
Spoke with pt, Aware of dr crenshaw's recommendations.  ?New script sent to the pharmacy  ?Lab orders mailed to the pt  ?

## 2014-12-25 NOTE — Telephone Encounter (Signed)
Steven Charles is calling because he is taking the Crestor 20 mg and wants to know if he can take the Atorvastatin , and will if be feasible for him to switch

## 2014-12-25 NOTE — Telephone Encounter (Signed)
Dc crestor, lipitor 40 mg daily lipids and liver six weeks [name]

## 2015-01-06 ENCOUNTER — Other Ambulatory Visit: Payer: Self-pay | Admitting: Internal Medicine

## 2015-01-11 ENCOUNTER — Other Ambulatory Visit: Payer: Self-pay | Admitting: Internal Medicine

## 2015-01-12 ENCOUNTER — Encounter: Payer: Self-pay | Admitting: Internal Medicine

## 2015-01-13 MED ORDER — LORAZEPAM 1 MG PO TABS: 1.0000 mg | ORAL_TABLET | Freq: Three times a day (TID) | ORAL | Status: DC | PRN

## 2015-01-13 NOTE — Telephone Encounter (Signed)
Done hardcopy to Dahlia  

## 2015-01-13 NOTE — Telephone Encounter (Signed)
Rx faxed to pharmacy  

## 2015-01-15 DIAGNOSIS — M12562 Traumatic arthropathy, left knee: Secondary | ICD-10-CM | POA: Diagnosis not present

## 2015-01-15 DIAGNOSIS — M25572 Pain in left ankle and joints of left foot: Secondary | ICD-10-CM | POA: Diagnosis not present

## 2015-01-15 DIAGNOSIS — L97511 Non-pressure chronic ulcer of other part of right foot limited to breakdown of skin: Secondary | ICD-10-CM | POA: Diagnosis not present

## 2015-01-24 ENCOUNTER — Other Ambulatory Visit: Payer: Self-pay | Admitting: Internal Medicine

## 2015-01-27 NOTE — Telephone Encounter (Signed)
Rx faxed to pharmacy  

## 2015-01-27 NOTE — Telephone Encounter (Signed)
Done hardcopy to Dahlia  

## 2015-01-28 ENCOUNTER — Other Ambulatory Visit: Payer: Self-pay | Admitting: Internal Medicine

## 2015-02-12 ENCOUNTER — Telehealth: Payer: Self-pay

## 2015-02-12 NOTE — Telephone Encounter (Signed)
Call to Steven Charles and explained that there was a conflict with 9am on my schedule; Agreed to cancel 9am AWV and will come in for Dr. Jenny Reichmann at [facility]; Will try to stay if he can for AWV at 10:45 or 11am; If that does not work out, then we will reschedule

## 2015-02-17 ENCOUNTER — Ambulatory Visit (INDEPENDENT_AMBULATORY_CARE_PROVIDER_SITE_OTHER): Payer: Commercial Managed Care - HMO | Admitting: Internal Medicine

## 2015-02-17 ENCOUNTER — Other Ambulatory Visit (INDEPENDENT_AMBULATORY_CARE_PROVIDER_SITE_OTHER): Payer: Commercial Managed Care - HMO

## 2015-02-17 ENCOUNTER — Ambulatory Visit: Payer: Commercial Managed Care - HMO

## 2015-02-17 ENCOUNTER — Encounter: Payer: Self-pay | Admitting: Internal Medicine

## 2015-02-17 VITALS — BP 128/68 | HR 59 | Temp 97.7°F | Ht 68.0 in | Wt 281.0 lb

## 2015-02-17 DIAGNOSIS — R7302 Impaired glucose tolerance (oral): Secondary | ICD-10-CM

## 2015-02-17 DIAGNOSIS — N138 Other obstructive and reflux uropathy: Secondary | ICD-10-CM | POA: Insufficient documentation

## 2015-02-17 DIAGNOSIS — N401 Enlarged prostate with lower urinary tract symptoms: Secondary | ICD-10-CM

## 2015-02-17 DIAGNOSIS — Z23 Encounter for immunization: Secondary | ICD-10-CM | POA: Diagnosis not present

## 2015-02-17 DIAGNOSIS — Z0001 Encounter for general adult medical examination with abnormal findings: Secondary | ICD-10-CM

## 2015-02-17 DIAGNOSIS — I10 Essential (primary) hypertension: Secondary | ICD-10-CM

## 2015-02-17 DIAGNOSIS — E785 Hyperlipidemia, unspecified: Secondary | ICD-10-CM

## 2015-02-17 DIAGNOSIS — Z0000 Encounter for general adult medical examination without abnormal findings: Secondary | ICD-10-CM

## 2015-02-17 LAB — HEPATIC FUNCTION PANEL
ALBUMIN: 3.8 g/dL (ref 3.5–5.2)
ALT: 16 U/L (ref 0–53)
AST: 14 U/L (ref 0–37)
Alkaline Phosphatase: 110 U/L (ref 39–117)
Bilirubin, Direct: 0.1 mg/dL (ref 0.0–0.3)
Total Bilirubin: 0.6 mg/dL (ref 0.2–1.2)
Total Protein: 7.6 g/dL (ref 6.0–8.3)

## 2015-02-17 LAB — BASIC METABOLIC PANEL
BUN: 16 mg/dL (ref 6–23)
CALCIUM: 9.5 mg/dL (ref 8.4–10.5)
CO2: 29 mEq/L (ref 19–32)
Chloride: 102 mEq/L (ref 96–112)
Creatinine, Ser: 1.22 mg/dL (ref 0.40–1.50)
GFR: 62.15 mL/min (ref >60.00)
GLUCOSE: 94 mg/dL (ref 70–99)
Potassium: 4.4 mEq/L (ref 3.5–5.1)
Sodium: 138 mEq/L (ref 135–145)

## 2015-02-17 LAB — LIPID PANEL
CHOL/HDL RATIO: 5
CHOLESTEROL: 150 mg/dL (ref 0–200)
HDL: 29.8 mg/dL — ABNORMAL LOW (ref >39.00)
LDL CALC: 82 mg/dL (ref 0–99)
NonHDL: 120.01
TRIGLYCERIDES: 188 mg/dL — AB (ref 0.0–149.0)
VLDL: 37.6 mg/dL (ref 0.0–40.0)

## 2015-02-17 LAB — HEMOGLOBIN A1C: Hgb A1c MFr Bld: 6.4 % (ref 4.6–6.5)

## 2015-02-17 NOTE — Progress Notes (Signed)
low back pain  

## 2015-02-17 NOTE — Assessment & Plan Note (Signed)
stable overall by history and exam, recent data reviewed with pt, and pt to continue medical treatment as before,  to f/u any worsening symptoms or concerns BP Readings from Last 3 Encounters:  02/17/15 128/68  12/04/14 105/61  10/16/14 136/64
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
## 2015-02-17 NOTE — Assessment & Plan Note (Signed)
stable overall by history and exam, recent data reviewed with pt, and pt to continue medical treatment as before,  to f/u any worsening symptoms or concerns Lab Results  Component Value Date   HGBA1C 5.9 08/14/2014
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
## 2015-02-17 NOTE — Assessment & Plan Note (Signed)
With increase symtpoms, ok for urology referral

## 2015-02-17 NOTE — Patient Instructions (Addendum)
You had the flu shot today  Please continue all other medications as before, and refills have been done if requested.  Please have the pharmacy call with any other refills you may need.  Please continue your efforts at being more active, low cholesterol diet, and weight control.  You are otherwise up to date with prevention measures today.  You will be contacted regarding the referral for: urology  Please keep your appointments with your specialists as you may have planned  Please go to the LAB in the Basement (turn left off the elevator) for the tests to be done today  You will be contacted by phone if any changes need to be made immediately.  Otherwise, you will receive a letter about your results with an explanation, but please check with MyChart first.  Please return in 6 months, or sooner if needed, with Lab testing done 3-5 days before   Mr. Markell , Thank you for taking time to come for your Medicare Wellness Visit. I appreciate your ongoing commitment to your health goals. Please review the following plan we discussed and let me know if I can assist you in the future.   To have eyes checked soon, Will consider PSV 23 (pneumonia )  Again, Medicare recommends 2 pneumonia vaccines after 65; It appears the PSV 23 you had around 69 yo.    These are the goals we discussed: Goals    . patient     Would like for left knee to feel better;          This is a list of the screening recommended for you and due dates:  Health Maintenance  Topic Date Due  .  Hepatitis C: One time screening is recommended by Center for Disease Control  (CDC) for  adults born from [birth year] through 1967.   05-Jul-1971  . Pneumonia vaccines (2 of 2 - PPSV23) 02/15/2014  . Flu Shot  10/27/2014  . Colon Cancer Screening  12/20/2015  . Tetanus Vaccine  01/24/2018  . Shingles Vaccine  Completed

## 2015-02-17 NOTE — Assessment & Plan Note (Signed)
Now on liptior 40. For fu labs today, consider change back to crestor 40 if ldl increased

## 2015-02-25 ENCOUNTER — Other Ambulatory Visit: Payer: Self-pay | Admitting: Internal Medicine

## 2015-03-02 ENCOUNTER — Encounter: Payer: Self-pay | Admitting: Internal Medicine

## 2015-03-19 ENCOUNTER — Telehealth: Payer: Self-pay | Admitting: Cardiology

## 2015-03-19 NOTE — Telephone Encounter (Signed)
Pt wants to know if you received his lab results from Dr Cathlean Cower?

## 2015-03-20 NOTE — Telephone Encounter (Signed)
Left message for pt, lab results forwarded to dr Stanford Breed for review

## 2015-03-27 ENCOUNTER — Telehealth: Payer: Self-pay | Admitting: *Deleted

## 2015-03-27 DIAGNOSIS — E785 Hyperlipidemia, unspecified: Secondary | ICD-10-CM

## 2015-03-27 MED ORDER — TRAMADOL HCL 50 MG PO TABS: ORAL_TABLET | ORAL | Status: DC

## 2015-03-27 MED ORDER — CARVEDILOL 12.5 MG PO TABS: 12.5000 mg | ORAL_TABLET | Freq: Two times a day (BID) | ORAL | Status: DC

## 2015-03-27 MED ORDER — ATORVASTATIN CALCIUM 40 MG PO TABS: 40.0000 mg | ORAL_TABLET | Freq: Every day | ORAL | Status: DC

## 2015-03-27 NOTE — Telephone Encounter (Signed)
Left msg on triage stating needing refills sent to [facility] for his Tramadol, Carvedilol, and Atorvastatin. Sent maintenance pls advise on Tramadol...Johny Chess

## 2015-03-27 NOTE — Telephone Encounter (Signed)
Done hardcopy to Dahlia  

## 2015-03-31 NOTE — Telephone Encounter (Signed)
Faxed Tramadol script to [facility] all other scripts went electronically...Johny Chess

## 2015-04-02 MED ORDER — ATORVASTATIN CALCIUM 40 MG PO TABS: 40.0000 mg | ORAL_TABLET | Freq: Every day | ORAL | Status: DC

## 2015-04-02 MED ORDER — CARVEDILOL 12.5 MG PO TABS
12.5000 mg | ORAL_TABLET | Freq: Two times a day (BID) | ORAL | Status: DC
Start: 2015-04-02 — End: 2016-02-29

## 2015-04-02 NOTE — Addendum Note (Signed)
Addended by: Earnstine Regal on: 04/02/2015 12:46 PM   Modules accepted: Orders

## 2015-04-02 NOTE — Telephone Encounter (Signed)
Resent to [facility] had to leave Tramadol on pharmacist vm...Johny Chess

## 2015-04-02 NOTE — Telephone Encounter (Signed)
Patient walked in to advise that he needed scripts sent to [facility] on [address] and not [facility]. i have added this to the pharmacy list as well.Marland KitchenMarland Kitchen

## 2015-04-09 ENCOUNTER — Other Ambulatory Visit: Payer: Self-pay | Admitting: Cardiology

## 2015-04-09 ENCOUNTER — Other Ambulatory Visit: Payer: Self-pay

## 2015-04-09 DIAGNOSIS — E785 Hyperlipidemia, unspecified: Secondary | ICD-10-CM

## 2015-04-09 MED ORDER — TAMSULOSIN HCL 0.4 MG PO CAPS: 0.4000 mg | ORAL_CAPSULE | Freq: Two times a day (BID) | ORAL | Status: DC

## 2015-04-09 MED ORDER — POTASSIUM CHLORIDE CRYS ER 20 MEQ PO TBCR: 20.0000 meq | EXTENDED_RELEASE_TABLET | Freq: Two times a day (BID) | ORAL | Status: DC

## 2015-04-09 MED ORDER — PANTOPRAZOLE SODIUM 40 MG PO TBEC: 40.0000 mg | DELAYED_RELEASE_TABLET | Freq: Two times a day (BID) | ORAL | Status: DC

## 2015-04-09 MED ORDER — FLUOXETINE HCL 40 MG PO CAPS: 40.0000 mg | ORAL_CAPSULE | Freq: Every day | ORAL | Status: DC

## 2015-04-09 MED ORDER — LISINOPRIL 20 MG PO TABS: 20.0000 mg | ORAL_TABLET | Freq: Every day | ORAL | Status: DC

## 2015-04-09 MED ORDER — FUROSEMIDE 40 MG PO TABS: 20.0000 mg | ORAL_TABLET | Freq: Two times a day (BID) | ORAL | Status: DC

## 2015-04-09 MED ORDER — ATORVASTATIN CALCIUM 40 MG PO TABS: 40.0000 mg | ORAL_TABLET | Freq: Every day | ORAL | Status: DC

## 2015-04-14 ENCOUNTER — Telehealth: Payer: Self-pay | Admitting: *Deleted

## 2015-04-14 NOTE — Telephone Encounter (Signed)
Wife left msg on triage this am stating [facility] states they have not received any refills on pt. Called pt back inform him MD sent in refills on all meds to [facility] on 03/1215. Pt stated that he finally received email from [facility] stating medications should be ship today. Also due to insurance need to change pharmacy to [facility] in [place]. Updated pharmacy...Steven Charles

## 2015-04-16 ENCOUNTER — Other Ambulatory Visit: Payer: Self-pay

## 2015-04-16 MED ORDER — LORAZEPAM 1 MG PO TABS: 1.0000 mg | ORAL_TABLET | Freq: Three times a day (TID) | ORAL | Status: DC | PRN

## 2015-04-16 NOTE — Telephone Encounter (Signed)
Done hardcopy to dahlia 

## 2015-04-16 NOTE — Telephone Encounter (Signed)
Rx faxed to pharmacy  

## 2015-04-16 NOTE — Telephone Encounter (Signed)
rx rq from [facility] for lorazepam

## 2015-04-17 MED ORDER — DICLOFENAC SODIUM 75 MG PO TBEC: 75.0000 mg | DELAYED_RELEASE_TABLET | Freq: Two times a day (BID) | ORAL | Status: DC

## 2015-04-17 NOTE — Telephone Encounter (Signed)
Received call pt ststes he is needing refills on his diclofenac. Verified pharmacy inform will send to [facility]...Steven Charles

## 2015-04-20 DIAGNOSIS — N401 Enlarged prostate with lower urinary tract symptoms: Secondary | ICD-10-CM | POA: Diagnosis not present

## 2015-04-20 DIAGNOSIS — Z0000 Encounter for general adult medical examination without abnormal findings: Secondary | ICD-10-CM | POA: Diagnosis not present

## 2015-04-20 DIAGNOSIS — N138 Other obstructive and reflux uropathy: Secondary | ICD-10-CM | POA: Diagnosis not present

## 2015-06-30 ENCOUNTER — Telehealth: Payer: Self-pay

## 2015-06-30 MED ORDER — DICLOFENAC SODIUM 75 MG PO TBEC: 75.0000 mg | DELAYED_RELEASE_TABLET | Freq: Two times a day (BID) | ORAL | Status: DC

## 2015-06-30 MED ORDER — TRAMADOL HCL 50 MG PO TABS: ORAL_TABLET | ORAL | Status: DC

## 2015-06-30 NOTE — Telephone Encounter (Signed)
Done hardcopy to Corinne  

## 2015-06-30 NOTE — Telephone Encounter (Signed)
rf rq for tramadol to [facility]. pls advise.

## 2015-07-03 ENCOUNTER — Other Ambulatory Visit: Payer: Self-pay | Admitting: *Deleted

## 2015-07-03 MED ORDER — GABAPENTIN 300 MG PO CAPS: 300.0000 mg | ORAL_CAPSULE | Freq: Two times a day (BID) | ORAL | Status: DC

## 2015-07-03 NOTE — Telephone Encounter (Signed)
Pt requesting refills on his Gabapentin. Inform wlll send to [facility]...Johny Chess

## 2015-08-18 ENCOUNTER — Telehealth: Payer: Self-pay | Admitting: *Deleted

## 2015-08-18 MED ORDER — LORAZEPAM 1 MG PO TABS: 1.0000 mg | ORAL_TABLET | Freq: Three times a day (TID) | ORAL | Status: DC | PRN

## 2015-08-18 NOTE — Telephone Encounter (Signed)
Left msg on triage requesting refill on his Lorazepam.../lmb

## 2015-08-18 NOTE — Telephone Encounter (Signed)
Done hardcopy to Corinne  

## 2015-08-18 NOTE — Telephone Encounter (Signed)
Medication sent to pharmacy  

## 2015-08-22 ENCOUNTER — Inpatient Hospital Stay (HOSPITAL_COMMUNITY)
Admission: EM | Admit: 2015-08-22 | Discharge: 2015-08-24 | DRG: 464 | Disposition: A | Discharge status: Home / Self Care | Payer: Medicare Other | Attending: Orthopedic Surgery | Admitting: Orthopedic Surgery

## 2015-08-22 ENCOUNTER — Encounter (HOSPITAL_COMMUNITY): Payer: Self-pay | Admitting: Emergency Medicine

## 2015-08-22 ENCOUNTER — Emergency Department (HOSPITAL_COMMUNITY): Payer: Medicare Other | Admitting: Anesthesiology

## 2015-08-22 ENCOUNTER — Emergency Department (HOSPITAL_COMMUNITY): Payer: Medicare Other

## 2015-08-22 ENCOUNTER — Encounter (HOSPITAL_COMMUNITY)
Admission: EM | Disposition: A | Discharge status: Home / Self Care | Payer: Self-pay | Source: Home / Self Care | Attending: Orthopedic Surgery

## 2015-08-22 DIAGNOSIS — M71162 Other infective bursitis, left knee: Secondary | ICD-10-CM | POA: Diagnosis not present

## 2015-08-22 DIAGNOSIS — S80259A Superficial foreign body, unspecified knee, initial encounter: Secondary | ICD-10-CM

## 2015-08-22 DIAGNOSIS — Z6839 Body mass index (BMI) 39.0-39.9, adult: Secondary | ICD-10-CM

## 2015-08-22 DIAGNOSIS — E785 Hyperlipidemia, unspecified: Secondary | ICD-10-CM | POA: Diagnosis present

## 2015-08-22 DIAGNOSIS — Z808 Family history of malignant neoplasm of other organs or systems: Secondary | ICD-10-CM

## 2015-08-22 DIAGNOSIS — T8454XA Infection and inflammatory reaction due to internal left knee prosthesis, initial encounter: Secondary | ICD-10-CM | POA: Diagnosis not present

## 2015-08-22 DIAGNOSIS — Z8249 Family history of ischemic heart disease and other diseases of the circulatory system: Secondary | ICD-10-CM | POA: Diagnosis not present

## 2015-08-22 DIAGNOSIS — I10 Essential (primary) hypertension: Secondary | ICD-10-CM | POA: Diagnosis not present

## 2015-08-22 DIAGNOSIS — E669 Obesity, unspecified: Secondary | ICD-10-CM | POA: Diagnosis present

## 2015-08-22 DIAGNOSIS — I2510 Atherosclerotic heart disease of native coronary artery without angina pectoris: Secondary | ICD-10-CM | POA: Diagnosis not present

## 2015-08-22 DIAGNOSIS — L089 Local infection of the skin and subcutaneous tissue, unspecified: Secondary | ICD-10-CM | POA: Diagnosis present

## 2015-08-22 DIAGNOSIS — Z833 Family history of diabetes mellitus: Secondary | ICD-10-CM

## 2015-08-22 DIAGNOSIS — Z951 Presence of aortocoronary bypass graft: Secondary | ICD-10-CM

## 2015-08-22 DIAGNOSIS — Z888 Allergy status to other drugs, medicaments and biological substances status: Secondary | ICD-10-CM

## 2015-08-22 DIAGNOSIS — E119 Type 2 diabetes mellitus without complications: Secondary | ICD-10-CM | POA: Diagnosis present

## 2015-08-22 DIAGNOSIS — L03116 Cellulitis of left lower limb: Secondary | ICD-10-CM | POA: Diagnosis not present

## 2015-08-22 DIAGNOSIS — Z7982 Long term (current) use of aspirin: Secondary | ICD-10-CM | POA: Diagnosis not present

## 2015-08-22 DIAGNOSIS — Y831 Surgical operation with implant of artificial internal device as the cause of abnormal reaction of the patient, or of later complication, without mention of misadventure at the time of the procedure: Secondary | ICD-10-CM | POA: Diagnosis present

## 2015-08-22 DIAGNOSIS — M25562 Pain in left knee: Secondary | ICD-10-CM | POA: Diagnosis not present

## 2015-08-22 DIAGNOSIS — Z79899 Other long term (current) drug therapy: Secondary | ICD-10-CM | POA: Diagnosis not present

## 2015-08-22 DIAGNOSIS — M7042 Prepatellar bursitis, left knee: Secondary | ICD-10-CM | POA: Diagnosis not present

## 2015-08-22 DIAGNOSIS — M25462 Effusion, left knee: Secondary | ICD-10-CM | POA: Diagnosis not present

## 2015-08-22 DIAGNOSIS — L02416 Cutaneous abscess of left lower limb: Secondary | ICD-10-CM | POA: Diagnosis not present

## 2015-08-22 HISTORY — PX: I & D EXTREMITY: SHX5045

## 2015-08-22 LAB — COMPREHENSIVE METABOLIC PANEL
ALT: 18 U/L (ref 17–63)
ANION GAP: 7 (ref 5–15)
AST: 19 U/L (ref 15–41)
Albumin: 3.2 g/dL — ABNORMAL LOW (ref 3.5–5.0)
Alkaline Phosphatase: 84 U/L (ref 38–126)
BUN: 22 mg/dL — ABNORMAL HIGH (ref 6–20)
CHLORIDE: 101 mmol/L (ref 101–111)
CO2: 26 mmol/L (ref 22–32)
CREATININE: 1.18 mg/dL (ref 0.61–1.24)
Calcium: 9.2 mg/dL (ref 8.9–10.3)
GFR calc non Af Amer: 60 mL/min — ABNORMAL LOW (ref >60)
Glucose, Bld: 105 mg/dL — ABNORMAL HIGH (ref 65–99)
Potassium: 4.1 mmol/L (ref 3.5–5.1)
SODIUM: 134 mmol/L — AB (ref 135–145)
Total Bilirubin: 1 mg/dL (ref 0.3–1.2)
Total Protein: 7.4 g/dL (ref 6.5–8.1)

## 2015-08-22 LAB — CBC WITH DIFFERENTIAL/PLATELET
Basophils Absolute: 0 10*3/uL (ref 0.0–0.1)
Basophils Relative: 0 %
Eosinophils Absolute: 0.2 10*3/uL (ref 0.0–0.7)
Eosinophils Relative: 2 %
HEMATOCRIT: 33.1 % — AB (ref 39.0–52.0)
HEMOGLOBIN: 10.8 g/dL — AB (ref 13.0–17.0)
LYMPHS ABS: 1.4 10*3/uL (ref 0.7–4.0)
LYMPHS PCT: 16 %
MCH: 28.3 pg (ref 26.0–34.0)
MCHC: 32.6 g/dL (ref 30.0–36.0)
MCV: 86.6 fL (ref 78.0–100.0)
MONOS PCT: 10 %
Monocytes Absolute: 0.9 10*3/uL (ref 0.1–1.0)
NEUTROS ABS: 6.6 10*3/uL (ref 1.7–7.7)
NEUTROS PCT: 72 %
Platelets: 218 10*3/uL (ref 150–400)
RBC: 3.82 MIL/uL — ABNORMAL LOW (ref 4.22–5.81)
RDW: 14.3 % (ref 11.5–15.5)
WBC: 9.1 10*3/uL (ref 4.0–10.5)

## 2015-08-22 LAB — SYNOVIAL CELL COUNT + DIFF, W/ CRYSTALS
Crystals, Fluid: NONE SEEN
EOSINOPHILS-SYNOVIAL: 0 % (ref 0–1)
Lymphocytes-Synovial Fld: 0 % (ref 0–20)
Monocyte-Macrophage-Synovial Fluid: 1 % — ABNORMAL LOW (ref 50–90)
Neutrophil, Synovial: 99 % — ABNORMAL HIGH (ref 0–25)
WBC, Synovial: UNDETERMINED /mm3 (ref 0–200)

## 2015-08-22 LAB — SEDIMENTATION RATE: SED RATE: 70 mm/h — AB (ref 0–16)

## 2015-08-22 SURGERY — IRRIGATION AND DEBRIDEMENT EXTREMITY
Anesthesia: General | Site: Knee | Laterality: Left

## 2015-08-22 MED ORDER — PROPOFOL 10 MG/ML IV BOLUS
INTRAVENOUS | Status: AC
  Filled 2015-08-22: qty 20

## 2015-08-22 MED ORDER — VANCOMYCIN HCL 1000 MG IV SOLR
INTRAVENOUS | Status: DC | PRN
  Administered 2015-08-22: 1 g

## 2015-08-22 MED ORDER — VITAMIN C 500 MG PO TABS
500.0000 mg | ORAL_TABLET | Freq: Every day | ORAL | Status: DC
  Administered 2015-08-22 – 2015-08-24 (×3): 500 mg via ORAL
  Filled 2015-08-22 (×3): qty 1

## 2015-08-22 MED ORDER — DEXTROSE 5 % IV SOLN
1.0000 g | Freq: Once | INTRAVENOUS | Status: AC
  Administered 2015-08-22: 1 g via INTRAVENOUS
  Filled 2015-08-22: qty 10

## 2015-08-22 MED ORDER — GENTAMICIN SULFATE 40 MG/ML IJ SOLN
80.0000 mg | Freq: Once | INTRAMUSCULAR | Status: AC
  Administered 2015-08-22: 80 mg
  Filled 2015-08-22: qty 2

## 2015-08-22 MED ORDER — ASPIRIN EC 325 MG PO TBEC
325.0000 mg | DELAYED_RELEASE_TABLET | Freq: Every day | ORAL | Status: DC
  Administered 2015-08-22 – 2015-08-24 (×3): 325 mg via ORAL
  Filled 2015-08-22 (×3): qty 1

## 2015-08-22 MED ORDER — VANCOMYCIN HCL IN DEXTROSE 1-5 GM/200ML-% IV SOLN
1000.0000 mg | Freq: Once | INTRAVENOUS | Status: AC
  Administered 2015-08-22: 1000 mg via INTRAVENOUS
  Filled 2015-08-22: qty 200

## 2015-08-22 MED ORDER — FLUOXETINE HCL 20 MG PO CAPS
40.0000 mg | ORAL_CAPSULE | Freq: Every day | ORAL | Status: DC
  Administered 2015-08-22 – 2015-08-24 (×3): 40 mg via ORAL
  Filled 2015-08-22 (×3): qty 2

## 2015-08-22 MED ORDER — VANCOMYCIN HCL 1000 MG IV SOLR
INTRAVENOUS | Status: AC
  Filled 2015-08-22: qty 1000

## 2015-08-22 MED ORDER — ONDANSETRON HCL 4 MG/2ML IJ SOLN
INTRAMUSCULAR | Status: DC | PRN
  Administered 2015-08-22: 4 mg via INTRAVENOUS

## 2015-08-22 MED ORDER — PHENYLEPHRINE HCL 10 MG/ML IJ SOLN
INTRAMUSCULAR | Status: DC | PRN
  Administered 2015-08-22 (×2): 80 ug via INTRAVENOUS

## 2015-08-22 MED ORDER — HYDROMORPHONE HCL 1 MG/ML IJ SOLN
0.2500 mg | INTRAMUSCULAR | Status: DC | PRN
  Administered 2015-08-22: 0.25 mg via INTRAVENOUS

## 2015-08-22 MED ORDER — OXYCODONE HCL 5 MG/5ML PO SOLN
5.0000 mg | Freq: Once | ORAL | Status: DC | PRN
  Filled 2015-08-22: qty 5

## 2015-08-22 MED ORDER — 0.9 % SODIUM CHLORIDE (POUR BTL) OPTIME
TOPICAL | Status: DC | PRN
  Administered 2015-08-22: 3000 mL

## 2015-08-22 MED ORDER — METOCLOPRAMIDE HCL 5 MG/ML IJ SOLN
5.0000 mg | Freq: Three times a day (TID) | INTRAMUSCULAR | Status: DC | PRN
Start: 2015-08-22 — End: 2015-08-24

## 2015-08-22 MED ORDER — SODIUM CHLORIDE 0.9 % IV SOLN
Freq: Once | INTRAVENOUS | Status: AC
  Administered 2015-08-22: 15:00:00 via INTRAVENOUS

## 2015-08-22 MED ORDER — CARVEDILOL 12.5 MG PO TABS
12.5000 mg | ORAL_TABLET | Freq: Two times a day (BID) | ORAL | Status: DC
  Administered 2015-08-24: 12.5 mg via ORAL
  Filled 2015-08-22 (×5): qty 1

## 2015-08-22 MED ORDER — OXYCODONE HCL 5 MG PO TABS: 5.0000 mg | ORAL_TABLET | Freq: Once | ORAL | Status: DC | PRN

## 2015-08-22 MED ORDER — GENTAMICIN SULFATE 40 MG/ML IJ SOLN
160.0000 mg | Freq: Once | INTRAMUSCULAR | Status: DC
  Filled 2015-08-22: qty 4

## 2015-08-22 MED ORDER — HYDROMORPHONE HCL 1 MG/ML IJ SOLN
INTRAMUSCULAR | Status: AC
  Filled 2015-08-22: qty 1

## 2015-08-22 MED ORDER — TAMSULOSIN HCL 0.4 MG PO CAPS
0.4000 mg | ORAL_CAPSULE | Freq: Two times a day (BID) | ORAL | Status: DC
  Administered 2015-08-22 – 2015-08-24 (×4): 0.4 mg via ORAL
  Filled 2015-08-22 (×5): qty 1

## 2015-08-22 MED ORDER — POTASSIUM CHLORIDE CRYS ER 20 MEQ PO TBCR
20.0000 meq | EXTENDED_RELEASE_TABLET | Freq: Two times a day (BID) | ORAL | Status: DC
  Administered 2015-08-22 – 2015-08-24 (×4): 20 meq via ORAL
  Filled 2015-08-22 (×6): qty 1

## 2015-08-22 MED ORDER — TRAMADOL HCL 50 MG PO TABS: 50.0000 mg | ORAL_TABLET | Freq: Four times a day (QID) | ORAL | Status: DC | PRN

## 2015-08-22 MED ORDER — POTASSIUM CHLORIDE IN NACL 20-0.9 MEQ/L-% IV SOLN
INTRAVENOUS | Status: AC
  Administered 2015-08-23: 04:00:00 via INTRAVENOUS
  Filled 2015-08-22: qty 1000

## 2015-08-22 MED ORDER — LISINOPRIL 20 MG PO TABS
20.0000 mg | ORAL_TABLET | Freq: Every day | ORAL | Status: DC
  Administered 2015-08-22: 20 mg via ORAL
  Filled 2015-08-22 (×3): qty 1

## 2015-08-22 MED ORDER — ACETAMINOPHEN 650 MG RE SUPP: 650.0000 mg | Freq: Four times a day (QID) | RECTAL | Status: DC | PRN

## 2015-08-22 MED ORDER — ONDANSETRON HCL 4 MG PO TABS
4.0000 mg | ORAL_TABLET | Freq: Four times a day (QID) | ORAL | Status: DC | PRN
  Administered 2015-08-23: 4 mg via ORAL
  Filled 2015-08-22: qty 1

## 2015-08-22 MED ORDER — FENTANYL CITRATE (PF) 100 MCG/2ML IJ SOLN
INTRAMUSCULAR | Status: AC
  Filled 2015-08-22: qty 2

## 2015-08-22 MED ORDER — ALFUZOSIN HCL ER 10 MG PO TB24
10.0000 mg | ORAL_TABLET | Freq: Every day | ORAL | Status: DC
  Administered 2015-08-23 – 2015-08-24 (×2): 10 mg via ORAL
  Filled 2015-08-22 (×3): qty 1

## 2015-08-22 MED ORDER — FENTANYL CITRATE (PF) 100 MCG/2ML IJ SOLN
INTRAMUSCULAR | Status: DC | PRN
  Administered 2015-08-22: 100 ug via INTRAVENOUS

## 2015-08-22 MED ORDER — ONDANSETRON HCL 4 MG/2ML IJ SOLN
INTRAMUSCULAR | Status: AC
  Filled 2015-08-22: qty 2

## 2015-08-22 MED ORDER — PROPOFOL 10 MG/ML IV BOLUS
INTRAVENOUS | Status: DC | PRN
  Administered 2015-08-22: 150 mg via INTRAVENOUS

## 2015-08-22 MED ORDER — PANTOPRAZOLE SODIUM 40 MG PO TBEC
40.0000 mg | DELAYED_RELEASE_TABLET | Freq: Two times a day (BID) | ORAL | Status: DC
  Administered 2015-08-22 – 2015-08-24 (×4): 40 mg via ORAL
  Filled 2015-08-22 (×6): qty 1

## 2015-08-22 MED ORDER — LIDOCAINE HCL (CARDIAC) 20 MG/ML IV SOLN
INTRAVENOUS | Status: DC | PRN
  Administered 2015-08-22: 80 mg via INTRAVENOUS

## 2015-08-22 MED ORDER — ONDANSETRON HCL 4 MG/2ML IJ SOLN: 4.0000 mg | Freq: Four times a day (QID) | INTRAMUSCULAR | Status: DC | PRN

## 2015-08-22 MED ORDER — OXYCODONE HCL 5 MG PO TABS
5.0000 mg | ORAL_TABLET | ORAL | Status: DC | PRN
  Administered 2015-08-22 – 2015-08-24 (×5): 10 mg via ORAL
  Filled 2015-08-22 (×5): qty 2

## 2015-08-22 MED ORDER — GABAPENTIN 300 MG PO CAPS
300.0000 mg | ORAL_CAPSULE | Freq: Two times a day (BID) | ORAL | Status: DC
  Administered 2015-08-22 – 2015-08-24 (×4): 300 mg via ORAL
  Filled 2015-08-22 (×5): qty 1

## 2015-08-22 MED ORDER — METOCLOPRAMIDE HCL 10 MG PO TABS: 5.0000 mg | ORAL_TABLET | Freq: Three times a day (TID) | ORAL | Status: DC | PRN

## 2015-08-22 MED ORDER — FINASTERIDE 5 MG PO TABS
5.0000 mg | ORAL_TABLET | Freq: Every day | ORAL | Status: DC
  Administered 2015-08-22 – 2015-08-24 (×3): 5 mg via ORAL
  Filled 2015-08-22 (×3): qty 1

## 2015-08-22 MED ORDER — DEXTROSE 5 % IV SOLN
2.0000 g | INTRAVENOUS | Status: DC
  Administered 2015-08-23: 2 g via INTRAVENOUS
  Filled 2015-08-22 (×2): qty 2

## 2015-08-22 MED ORDER — ACETAMINOPHEN 325 MG PO TABS: 650.0000 mg | ORAL_TABLET | Freq: Four times a day (QID) | ORAL | Status: DC | PRN

## 2015-08-22 MED ORDER — LORAZEPAM 1 MG PO TABS
1.0000 mg | ORAL_TABLET | Freq: Three times a day (TID) | ORAL | Status: DC | PRN
  Administered 2015-08-24: 1 mg via ORAL
  Filled 2015-08-22: qty 1

## 2015-08-22 MED ORDER — ATORVASTATIN CALCIUM 40 MG PO TABS
40.0000 mg | ORAL_TABLET | Freq: Every day | ORAL | Status: DC
  Administered 2015-08-22 – 2015-08-24 (×3): 40 mg via ORAL
  Filled 2015-08-22 (×3): qty 1

## 2015-08-22 MED ORDER — MEPERIDINE HCL 50 MG/ML IJ SOLN: 6.2500 mg | INTRAMUSCULAR | Status: DC | PRN

## 2015-08-22 MED ORDER — LIDOCAINE HCL (CARDIAC) 20 MG/ML IV SOLN
INTRAVENOUS | Status: AC
  Filled 2015-08-22: qty 5

## 2015-08-22 MED ORDER — VANCOMYCIN HCL IN DEXTROSE 1-5 GM/200ML-% IV SOLN
1000.0000 mg | Freq: Two times a day (BID) | INTRAVENOUS | Status: AC
  Administered 2015-08-23: 1000 mg via INTRAVENOUS
  Filled 2015-08-22: qty 200

## 2015-08-22 MED ORDER — FUROSEMIDE 20 MG PO TABS
20.0000 mg | ORAL_TABLET | Freq: Two times a day (BID) | ORAL | Status: DC
  Administered 2015-08-23 – 2015-08-24 (×3): 20 mg via ORAL
  Filled 2015-08-22 (×5): qty 1

## 2015-08-22 MED ORDER — TOBRAMYCIN SULFATE 1.2 G IJ SOLR
INTRAMUSCULAR | Status: AC
  Filled 2015-08-22: qty 1.2

## 2015-08-22 MED ORDER — DOCUSATE SODIUM 100 MG PO CAPS
100.0000 mg | ORAL_CAPSULE | Freq: Every day | ORAL | Status: DC | PRN
  Administered 2015-08-24: 100 mg via ORAL
  Filled 2015-08-22: qty 1

## 2015-08-22 MED ORDER — SODIUM CHLORIDE 0.9 % IV SOLN
INTRAVENOUS | Status: DC | PRN
  Administered 2015-08-22: 18:00:00 via INTRAVENOUS

## 2015-08-22 SURGICAL SUPPLY — 28 items
BAG ZIPLOCK 12X15 (MISCELLANEOUS) ×2 IMPLANT
BANDAGE ACE 6X5 VEL STRL LF (GAUZE/BANDAGES/DRESSINGS) ×2 IMPLANT
BANDAGE ESMARK 6X9 LF (GAUZE/BANDAGES/DRESSINGS) ×1 IMPLANT
BNDG ESMARK 6X9 LF (GAUZE/BANDAGES/DRESSINGS) ×2
BNDG GAUZE ELAST 4 BULKY (GAUZE/BANDAGES/DRESSINGS) ×2 IMPLANT
CUFF TOURN SGL QUICK 18 (TOURNIQUET CUFF) IMPLANT
CUFF TOURN SGL QUICK 24 (TOURNIQUET CUFF)
CUFF TOURN SGL QUICK 34 (TOURNIQUET CUFF)
CUFF TRNQT CYL 24X4X40X1 (TOURNIQUET CUFF) IMPLANT
CUFF TRNQT CYL 34X4X40X1 (TOURNIQUET CUFF) IMPLANT
DRAIN PENROSE 18X1/2 LTX STRL (DRAIN) IMPLANT
DRSG PAD ABDOMINAL 8X10 ST (GAUZE/BANDAGES/DRESSINGS) IMPLANT
DURAPREP 26ML APPLICATOR (WOUND CARE) ×2 IMPLANT
ELECT REM PT RETURN 9FT ADLT (ELECTROSURGICAL) ×2
ELECTRODE REM PT RTRN 9FT ADLT (ELECTROSURGICAL) ×1 IMPLANT
GAUZE SPONGE 4X4 12PLY STRL (GAUZE/BANDAGES/DRESSINGS) ×2 IMPLANT
GLOVE SURG ORTHO 8.0 STRL STRW (GLOVE) ×2 IMPLANT
GOWN STRL REUS W/TWL LRG LVL3 (GOWN DISPOSABLE) ×2 IMPLANT
HANDPIECE INTERPULSE COAX TIP (DISPOSABLE)
KIT BASIN OR (CUSTOM PROCEDURE TRAY) ×2 IMPLANT
PACK TOTAL JOINT (CUSTOM PROCEDURE TRAY) ×2 IMPLANT
PAD CAST 4YDX4 CTTN HI CHSV (CAST SUPPLIES) ×1 IMPLANT
PADDING CAST COTTON 4X4 STRL (CAST SUPPLIES) ×1
POSITIONER SURGICAL ARM (MISCELLANEOUS) ×2 IMPLANT
SET HNDPC FAN SPRY TIP SCT (DISPOSABLE) IMPLANT
SUT ETHILON 2 0 PS N (SUTURE) ×4 IMPLANT
SYR CONTROL 10ML LL (SYRINGE) ×2 IMPLANT
TOWEL OR 17X26 10 PK STRL BLUE (TOWEL DISPOSABLE) ×4 IMPLANT

## 2015-08-22 NOTE — ED Notes (Signed)
MD at bedside. ORTHO PRESENT SPEAKING WITH PY AND FAMILY

## 2015-08-22 NOTE — ED Provider Notes (Signed)
Hand-off from [place], [place]. Dispo pending ortho evaluation in the ED.   See initial provider's note for HPI.  Initial evaluation showed pt's left knee to be actively draining with overlying cellulitis. Left knee x-ray revealed one fractured K-wire. VSS, afebrile. WBC 9.1. Initial provider consulted ortho, Dr. Marlou Sa advised to order wound cultures. Pt started on IV Vancomycin and Rocephin in the ED. Suspect pt will need surgical debridement of the knee, pending ortho evaluation.  Dr. Marlou Sa evaluated pt in the ED. Plan to take pt to the OR. Discussed plan with pt and family.     Chesley Noon Ignacio, [place] 08/22/15 [place], MD 08/23/15 [phone]

## 2015-08-22 NOTE — ED Notes (Signed)
Pt c/o left knee pain x 2 months, pt has had negative xrays at PCP. Pt now has drainage, swelling, and redness to knee and leg. Has progressed over the past couple of days.
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
## 2015-08-22 NOTE — Anesthesia Preprocedure Evaluation (Addendum)
Anesthesia Evaluation  Patient identified by MRN, date of birth, ID band Patient awake    Reviewed: Allergy & Precautions, NPO status , Patient's Chart, lab work & pertinent test results, reviewed documented beta blocker date and time   Airway Mallampati: II  TM Distance: >3 FB Neck ROM: Full    Dental  (+) Upper Dentures, Teeth Intact, Dental Advisory Given   Pulmonary former smoker,    breath sounds clear to auscultation       Cardiovascular hypertension, Pt. on medications and Pt. on home beta blockers + CAD, + Past MI, + Peripheral Vascular Disease and +CHF   Rhythm:Regular Rate:Normal  LV EF: 50% -  55%  ------------------------------------------------------------------- Indications:   414.01 Coronary atherosclerosis - native artery.  ------------------------------------------------------------------- History:  PMH:  Coronary artery disease. Coronary artery disease. Congestive heart failure. PMH:  Myocardial infarction. Risk factors: Former tobacco use. Hypertension. Dyslipidemia.  ------------------------------------------------------------------- Study Conclusions  - Left ventricle: The cavity size was normal. Wall thickness was increased increased in a pattern of mild to moderate LVH. Systolic function was normal. The estimated ejection fraction was in the range of 50% to 55%. Regional wall motion abnormalities cannot be excluded. Features are consistent with a pseudonormal left ventricular filling pattern, with concomitant abnormal relaxation and increased filling pressure (grade 2 diastolic dysfunction). Doppler parameters are consistent with high ventricular filling pressure. - Aortic valve: There was trivial regurgitation. - Aortic root: The aortic root was mildly dilated. - Mitral valve: Calcified annulus. - Left atrium: The atrium was moderately dilated. - Right atrium: The atrium  was mildly dilated.  Impressions:  - Normal LV function; cannot R/O focal wall motion abnormality; mild to moderate LVH; grade 2 diastolic dysfunction; biatrial enlargement; trace AI.  ------------------------------------------------------------------- Labs, prior tests, procedures, and surgery: Coronary artery bypass grafting.  Transthoracic echocardiography. M-mode, complete 2D, spectral Doppler, and color Doppler. Birthdate: Patient birthdate: [birth year]. Age: Patient is 70 yr old. Sex: Gender: male. BMI: 41.5 kg/m^2. Blood pressure:   140/60 Patient status: Outpatient. Study date: Study date: 01/06/2014. Study time: 08:10 AM. Location: Echo laboratory.   Neuro/Psych    GI/Hepatic GERD  Medicated and Controlled,  Endo/Other  Morbid obesity  Renal/GU      Musculoskeletal   Abdominal   Peds  Hematology   Anesthesia Other Findings   Reproductive/Obstetrics                         Anesthesia Physical Anesthesia Plan  ASA: IV and emergent  Anesthesia Plan: General   Post-op Pain Management:    Induction: Intravenous  Airway Management Planned: LMA  Additional Equipment:   Intra-op Plan:   Post-operative Plan: Extubation in OR  Informed Consent: I have reviewed the patients History and Physical, chart, labs and discussed the procedure including the risks, benefits and alternatives for the proposed anesthesia with the patient or authorized representative who has indicated his/her understanding and acceptance.   Dental advisory given  Plan Discussed with: CRNA, Surgeon and Anesthesiologist  Anesthesia Plan Comments:         Anesthesia Quick Evaluation
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
## 2015-08-22 NOTE — Transfer of Care (Signed)
Immediate Anesthesia Transfer of Care Note  Patient: Steven Charles  Procedure(s) Performed: Procedure(s): IRRIGATION AND DEBRIDEMENT EXTREMITY (Left)  Patient Location: PACU  Anesthesia Type:General  Level of Consciousness: awake, alert  and oriented  Airway & Oxygen Therapy: Patient Spontanous Breathing and Patient connected to face mask oxygen  Post-op Assessment: Report given to RN and Post -op Vital signs reviewed and stable  Post vital signs: Reviewed and stable  Last Vitals:  Filed Vitals:   08/22/15 1458 08/22/15 1631  BP: 125/60 122/50  Pulse: 57 56  Temp:  36.6 C  Resp: 18 16    Last Pain:  Filed Vitals:   08/22/15 1633  PainSc: 3          Complications: No apparent anesthesia complications

## 2015-08-22 NOTE — Anesthesia Postprocedure Evaluation (Signed)
Anesthesia Post Note  Patient: Steven Charles  Procedure(s) Performed: Procedure(s) (LRB): IRRIGATION AND DEBRIDEMENT EXTREMITY (Left)  Patient location during evaluation: PACU Anesthesia Type: General Level of consciousness: awake and alert Pain management: pain level controlled Vital Signs Assessment: post-procedure vital signs reviewed and stable Respiratory status: spontaneous breathing, nonlabored ventilation and respiratory function stable Cardiovascular status: blood pressure returned to baseline and stable Postop Assessment: no signs of nausea or vomiting Anesthetic complications: no    Last Vitals:  Filed Vitals:   08/22/15 1458 08/22/15 1631  BP: 125/60 122/50  Pulse: 57 56  Temp:  36.6 C  Resp: 18 16    Last Pain:  Filed Vitals:   08/22/15 1633  PainSc: 3                  [name] A

## 2015-08-22 NOTE — ED Notes (Signed)
ED PA at bedside

## 2015-08-22 NOTE — Anesthesia Procedure Notes (Signed)
Procedure Name: LMA Insertion Date/Time: 08/22/2015 6:14 PM Performed by: Glory Buff Pre-anesthesia Checklist: Patient identified, Emergency Drugs available, Suction available and Patient being monitored Patient Re-evaluated:Patient Re-evaluated prior to inductionOxygen Delivery Method: Circle system utilized Preoxygenation: Pre-oxygenation with 100% oxygen Intubation Type: IV induction Ventilation: Mask ventilation without difficulty LMA: LMA with gastric port inserted LMA Size: 4.0 Number of attempts: 1 Placement Confirmation: positive ETCO2 Tube secured with: Tape Dental Injury: Teeth and Oropharynx as per pre-operative assessment

## 2015-08-22 NOTE — Brief Op Note (Signed)
08/22/2015  7:11 PM  PATIENT:  [name]  70 y.o. male  PRE-OPERATIVE DIAGNOSIS:  superficial infected left total knee  POST-OPERATIVE DIAGNOSIS:  superficial infected left total knee  PROCEDURE:  Procedure(s): IRRIGATION AND DEBRIDEMENT EXTREMITY  Prepatellar bursa with knee joint aspiration  SURGEON:  Surgeon(s): Meredith Pel, MD  ASSISTANT: None  ANESTHESIA:   general  EBL: 35 ml    Total I/O In: 900 [I.V.:900] Out: -   BLOOD ADMINISTERED: none  DRAINS: none   LOCAL MEDICATIONS USED:  none  SPECIMEN:  Pre-patella bursa swabs sent for Gram stain and culture/  knee joint aspiration sent for Gram stain aerobic and anaerobic culture and cell count  COUNTS:  YES  TOURNIQUET:  * No tourniquets in log *  DICTATION: .Other Dictation: Dictation Number [id number]  PLAN OF CARE: Admit to inpatient   PATIENT DISPOSITION:  PACU - hemodynamically stable

## 2015-08-23 NOTE — Op Note (Signed)
NAME:  JERMANY, TWENTER NO.:  [id number]  MEDICAL RECORD NO.:  [id number]  LOCATION:  [id number]                         FACILITY:  [facility]  PHYSICIAN:  Anderson Malta, M.D.    DATE OF BIRTH:  1945-10-15  DATE OF PROCEDURE: DATE OF DISCHARGE:                              OPERATIVE REPORT   PREOPERATIVE DIAGNOSIS:  Left prepatellar bursa infection.  POSTOPERATIVE DIAGNOSIS:  Left prepatellar bursa infection with possible knee joint infection.  PROCEDURE:  Left knee prepatellar bursa I and D with left knee aspiration.  SURGEON:  Anderson Malta, M.D.  ASSISTANT:  None.  ANESTHESIA:  General.  INDICATIONS:  Steven Charles is a 70 year old patient with a 2-day history of left knee drainage.  He has a focal fluid collection and fluctuance over the anterior aspect of the incision over the patellar tendon.  He presents now for operative management after explanation of risks and benefits.  Did not have much effusion on examination in the ER, but did report some fevers and chills over the past several days.  White count normal in the emergency room.  PROCEDURE IN DETAIL:  The patient was brought to operating room, where general anesthetic was induced.  The patient did have culture of the wound done in the emergency room and was subsequently started on ceftriaxone and vancomycin.  While in the ER, the leg was prepped with Hibiclens saline, draped in the sterile manner.  Prior to doing anything with the incision, the area on the superior medial aspect of the of the knee away from the area of erythema was prepped with DuraPrep and then the knee joint was aspirated.  About 15 mL of fluid was obtained.  This was sent for Gram stain, cell count, aerobic and anaerobic culture.  A potential exists for infection within this fluid based on its appearance, but it was not definite.  At this time, the patient had about 1 x 1 cm area at the inferior aspect of the prior total knee incision, which  was draining and has some granulation tissue present. After calling time-out, which was done before the aspiration, this area was expanded proximally and distally.  Tissue was debrided.  Patellar tendon was visible.  There was no hardware from the patella fracture visible as this area was primarily over the inferior aspect of the patella and the patellar tendon.  Pocket superficial to the patella was encountered.  This was debrided.  I could not feel any connection between this cavity and the knee prosthesis.  Thorough irrigation with 3 L of irrigating solution were performed.  This area was then packed with stimulant beads, which were prepared using vancomycin and gentamicin. Following this, a bulky dressing and knee immobilizer were placed.  The patient was then transferred to the recovery room in stable condition with the knee immobilizer in place.  He will be allowed to be weightbearing as tolerated.  He will continue on vancomycin and ceftriaxone pending culture results from the knee aspirate.  He may require further surgery if the aspirate confirms infection in the joint.     Anderson Malta, M.D.     GSD/MEDQ  D:  08/22/2015  T:  08/23/2015  Job:  [id number]

## 2015-08-23 NOTE — Evaluation (Signed)
Physical Therapy Evaluation Patient Details Name: Steven Charles MRN: [id number] DOB: February 16, 1946 Today's Date: 08/23/2015   History of Present Illness  s/p I and D L knee d/t superficial infection: PMHx: L TKA  Clinical Impression  Pt admitted with above diagnosis. Pt currently with functional limitations due to the deficits listed below (see PT Problem List).  Pt will benefit from skilled PT to increase their independence and safety with mobility to allow discharge to the venue listed below.   Pt doing quite well, need clarification regarding ROM L knee; will see again for gait training, has cane at home and should benefit from practice withPT (discussed use of cane with pt if he should D/C home today)     Follow Up Recommendations No PT follow up    Equipment Recommendations  None recommended by PT    Recommendations for Other Services       Precautions / Restrictions Precautions Precaution Comments: need clarification regarding ROM L knee Required Braces or Orthoses: Knee Immobilizer - Left Knee Immobilizer - Left: On at all times Restrictions Weight Bearing Restrictions: No Other Position/Activity Restrictions: WBAT LLE      Mobility  Bed Mobility               General bed mobility comments: NT--pt exiting bathroom on arrival  Transfers Overall transfer level: Needs assistance Equipment used: None (IV pole) Transfers: Sit to/from Stand Sit to Stand: Min guard;Supervision         General transfer comment: for safety, cues for LLE position  Ambulation/Gait Ambulation/Gait assistance: Min guard;Supervision   Assistive device: None Gait Pattern/deviations: Step-through pattern;Decreased stance time - left;Drifts right/left     General Gait Details: amb ~80'  without AD however pt wants to touch rail, able to amb without AD with min/guard--decr speed; pt pushed IV pole ~100' with improved stability and incr speed(--may need to use cane at home)  Stairs            Wheelchair Mobility    Modified Rankin (Stroke Patients Only)       Balance     Sitting balance-Leahy Scale: Good       Standing balance-Leahy Scale: Fair               High level balance activites: Backward walking;Turns;Head turns High Level Balance Comments: drifting but no LOB             Pertinent Vitals/Pain Pain Assessment: 0-10 Pain Score: 2  Pain Location: L knee Pain Descriptors / Indicators: Grimacing;Sore Pain Intervention(s): Limited activity within patient's tolerance;Monitored during session;Premedicated before session;Repositioned    Home Living Family/patient expects to be discharged to:: Private residence Living Arrangements: Spouse/significant other   Type of Home: House Home Access: Ramped entrance     Home Layout: One level Home Equipment: Environmental consultant - 2 wheels;Cane - single point      Prior Function Level of Independence: Independent               Hand Dominance        Extremity/Trunk Assessment   Upper Extremity Assessment: Overall WFL for tasks assessed           Lower Extremity Assessment: LLE deficits/detail   LLE Deficits / Details: ankle WFL     Communication   Communication: No difficulties  Cognition Arousal/Alertness: Awake/alert Behavior During Therapy: WFL for tasks assessed/performed Overall Cognitive Status: Within Functional Limits for tasks assessed  General Comments      Exercises        Assessment/Plan    PT Assessment Patient needs continued PT services  PT Diagnosis Difficulty walking   PT Problem List Decreased mobility;Decreased activity tolerance;Decreased strength;Decreased range of motion;Pain;Decreased knowledge of use of DME  PT Treatment Interventions DME instruction;Gait training;Functional mobility training;Therapeutic activities;Therapeutic exercise;Patient/family education   PT Goals (Current goals can be found in the Care Plan  section) Acute Rehab PT Goals Patient Stated Goal: home soon PT Goal Formulation: With patient Time For Goal Achievement: 08/28/15 Potential to Achieve Goals: Good    Frequency Min 4X/week   Barriers to discharge        Co-evaluation               End of Session   Activity Tolerance: Patient tolerated treatment well Patient left: in chair;with call bell/phone within reach Nurse Communication: Mobility status         Time: [id number] PT Time Calculation (min) (ACUTE ONLY): 25 min   Charges:   PT Evaluation $PT Eval Low Complexity: 1 Procedure PT Treatments $Gait Training: 8-22 mins   PT G Codes:        Amadea Keagy Aug 27, 2015, 10:11 AM

## 2015-08-23 NOTE — Progress Notes (Signed)
Culture lab results give to Dr Sharol Given by phone; info acknowledged, no new orders. Steven Charles, [employer]

## 2015-08-23 NOTE — Progress Notes (Addendum)
Patient ID: Steven Charles, male   DOB: July 05, 1945, 70 y.o.   MRN: [id number] Postoperative day 1 status post debridement of infected prepatellar bursa left knee. Patient had no clinical signs of intra-articular involvement. Cultures were reviewed this morning no signs of any deep infection. Continue the vancomycin and Rocephin. Patient states that he is ready for discharge to home will reevaluate tomorrow with the labs to see if he is safe for discharge on oral antibiotics.

## 2015-08-24 MED ORDER — DOXYCYCLINE HYCLATE 50 MG PO CAPS: 100.0000 mg | ORAL_CAPSULE | Freq: Two times a day (BID) | ORAL | Status: DC

## 2015-08-24 MED ORDER — OXYCODONE-ACETAMINOPHEN 5-325 MG PO TABS: 1.0000 | ORAL_TABLET | ORAL | Status: DC | PRN

## 2015-08-24 NOTE — Progress Notes (Signed)
Patient ID: Steven Charles, male   DOB: [birth year], 70 y.o.   MRN: [id number] Patient's vital signs are stable. The dressing is removed and some of the antibiotic beads were expressed out of the wound. The wound is draining nicely there is no cellulitis no odor no signs of infection. A new sterile dressing was applied. Patient will be discharged to home today plan to start Dial soap cleansing daily prescription for doxycycline twice a day prescription for Percocet and patient will call if he has any change in his symptoms or clinical findings. Plan to follow-up on Friday this is the earliest  the patient can make it to the office.

## 2015-08-24 NOTE — Discharge Summary (Signed)
Physician Discharge Summary  Patient ID: Steven Charles MRN: [id number] DOB/AGE: Mar 01, 1946 70 y.o.  Admit date: 08/22/2015 Discharge date: 08/24/2015  Admission Diagnoses:Septic prepatellar bursitis left knee  Discharge Diagnoses:  Active Problems:   Foreign body of knee with infection   Discharged Condition: stable  Hospital Course: Patient's hospital course was essentially unremarkable. He underwent irrigation and debridement of prepatellar bursa. The cultures remain negative for the intra-articular aspiration. The wound cultures were positive for gram-positive cocci. Sensitivities are pending. The wound is clean and dry no redness no cellulitis no pain with range of motion the knee plan for discharge to home on oral doxycycline will adjust the antibiotics pending the results of the cultures.  Consults: None  Significant Diagnostic Studies: labs: Routine labs  Treatments: surgery: See operative note  Discharge Exam: Blood pressure 145/59, pulse 72, temperature 98.8 F (37.1 C), temperature source Oral, resp. rate 18, height 5\' 10"  (1.778 m), weight 126.1 kg (278 lb), SpO2 93 %. Incision/Wound: Dressing clean and dry and was changed,  some of the antibiotic beads were expressed from the wound. No purulence no signs of infection. Plan follow-up on Friday.  Disposition: 01-Home or Self Care     Medication List    ASK your doctor about these medications        alfuzosin 10 MG 24 hr tablet  Commonly known as:  UROXATRAL  Take 10 mg by mouth daily with breakfast.     aspirin 325 MG EC tablet  Take 325 mg by mouth daily.     atorvastatin 40 MG tablet  Commonly known as:  LIPITOR  Take 1 tablet (40 mg total) by mouth daily.     carvedilol 12.5 MG tablet  Commonly known as:  COREG  Take 1 tablet (12.5 mg total) by mouth 2 (two) times daily with a meal.     clotrimazole-betamethasone cream  Commonly known as:  LOTRISONE  USE AS DIRECTED TWICE DAILY AS NEEDED     diclofenac 75 MG EC tablet  Commonly known as:  VOLTAREN  Take 1 tablet (75 mg total) by mouth 2 (two) times daily.     docusate sodium 100 MG capsule  Commonly known as:  COLACE  Take 100 mg by mouth daily as needed for moderate constipation.     finasteride 5 MG tablet  Commonly known as:  PROSCAR  Take 5 mg by mouth daily.     FLUoxetine 40 MG capsule  Commonly known as:  PROZAC  Take 1 capsule (40 mg total) by mouth daily.     furosemide 40 MG tablet  Commonly known as:  LASIX  Take 0.5 tablets (20 mg total) by mouth 2 (two) times daily.     gabapentin 300 MG capsule  Commonly known as:  NEURONTIN  Take 1 capsule (300 mg total) by mouth 2 (two) times daily.     lisinopril 20 MG tablet  Commonly known as:  PRINIVIL,ZESTRIL  Take 1 tablet (20 mg total) by mouth daily.     LORazepam 1 MG tablet  Commonly known as:  ATIVAN  Take 1 tablet (1 mg total) by mouth 3 (three) times daily as needed.     pantoprazole 40 MG tablet  Commonly known as:  PROTONIX  Take 1 tablet (40 mg total) by mouth 2 (two) times daily.     potassium chloride SA 20 MEQ tablet  Commonly known as:  K-DUR,KLOR-CON  Take 1 tablet (20 mEq total) by mouth 2 (two) times daily.  tamsulosin 0.4 MG Caps capsule  Commonly known as:  FLOMAX  Take 1 capsule (0.4 mg total) by mouth 2 (two) times daily.     traMADol 50 MG tablet  Commonly known as:  ULTRAM  TAKE 1 TABLET EVERY 6 HOURS AS NEEDED FOR MODERATE PAIN     vardenafil 20 MG tablet  Commonly known as:  LEVITRA  Take 20 mg by mouth daily as needed for erectile dysfunction.     vitamin C 500 MG tablet  Commonly known as:  ASCORBIC ACID  Take 500 mg by mouth daily.           Follow-up Information    Call Newt Minion, MD.   Specialty:  Orthopedic Surgery   Why:  Called to make an appointment for Friday morning   Contact information:   [place] [address] [phone]       Signed: Newt Minion 08/24/2015,  9:18 AM

## 2015-08-24 NOTE — Progress Notes (Signed)
Utilization review completed.  

## 2015-08-25 ENCOUNTER — Encounter (HOSPITAL_COMMUNITY): Payer: Self-pay | Admitting: Orthopedic Surgery

## 2015-08-25 LAB — AEROBIC CULTURE W GRAM STAIN (SUPERFICIAL SPECIMEN)

## 2015-08-25 LAB — AEROBIC CULTURE  (SUPERFICIAL SPECIMEN)

## 2015-08-26 ENCOUNTER — Encounter (HOSPITAL_COMMUNITY): Payer: Self-pay | Admitting: *Deleted

## 2015-08-26 ENCOUNTER — Telehealth: Payer: Self-pay | Admitting: *Deleted

## 2015-08-26 LAB — BODY FLUID CULTURE

## 2015-08-26 LAB — ANAEROBIC CULTURE

## 2015-08-26 NOTE — Telephone Encounter (Signed)
Pt was on TCM list admitted for Septic prepatellar bursitis in (L) knee. Pt had a irrigation & debridement extremity will f/u w/Dr. Sharol Given in 2 wks,,,/lmb

## 2015-08-26 NOTE — Progress Notes (Signed)
Pt denies SOB and chest pain but stated that he is under the care of Dr. Stanford Breed, Cardiology. Pt verbally consented for spouse, Horris Latino, to listen in on phone call so that she could write down pre-op instructions. Pt made aware to stop vitamins, fish oil, herbal medications and NSAID's. LVM with Malachy Mood, Surgical Scheduler to clarify pre-op instructions for Aspirin and to have MD enter orders. Both pt and spouse verbalized understanding of all pre-op instructions. Pt chart forwarded to anesthesia to review cardiac history.

## 2015-08-27 ENCOUNTER — Other Ambulatory Visit (HOSPITAL_COMMUNITY): Payer: Self-pay | Admitting: Family

## 2015-08-27 ENCOUNTER — Other Ambulatory Visit (HOSPITAL_COMMUNITY): Payer: Self-pay | Admitting: Orthopedic Surgery

## 2015-08-27 DIAGNOSIS — M00862 Arthritis due to other bacteria, left knee: Secondary | ICD-10-CM

## 2015-08-27 MED ORDER — DEXTROSE 5 % IV SOLN
3.0000 g | INTRAVENOUS | Status: AC
  Administered 2015-08-28: 3 g via INTRAVENOUS
  Filled 2015-08-27 (×2): qty 3000

## 2015-08-27 MED ORDER — CEFAZOLIN SODIUM-DEXTROSE 2-4 GM/100ML-% IV SOLN: 2.0000 g | INTRAVENOUS | Status: DC

## 2015-08-27 NOTE — Progress Notes (Signed)
Steven Charles, Surgical Scheduler for Dr. Sharol Given, stated that MD advised that pt take Aspirin on DOS as prescribed. Pt made aware and verbalized understanding.

## 2015-08-27 NOTE — Progress Notes (Signed)
Anesthesia Chart Review:  Pt is a 70 year old male scheduled for poly exchange left knee on 08/28/2015 with Dr. Sharol Given.   Pt is a same day work up.   Cardiologist is Dr. [name], last office visit 07/18/14, 1 year f/u recommended. PCP is Dr. Cathlean Cower.   PMH includes:  CAD, MI, AAA, CHF, HTN, hyperlipidemia, OSA (no CPAP), impaired glucose tolerance. Former smoker. BMI 40  Medications include: ASA, lipitor carvedilol, lasix, lisinopril, protonix, potassium  Labs from 08/22/15 reviewed; CBC and CMET acceptable for surgery.   EKG 08/22/15: Sinus rhythm. Inferior Q waves noted. No significant change since last tracing. Inferior Q waves present on EKG's dating back to 12/25/12.   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 Wall thickness was increased increased in a pattern of mild to moderate LVH. Systolic function was normal. The estimated ejection fraction was in the range of 50% to 55%. Regional wall motion abnormalities cannot be excluded. Features are consistent with a pseudonormal left ventricular filling pattern, with concomitant abnormal relaxation and increased filling pressure (grade 2 diastolic dysfunction). Doppler parameters are consistent with high ventricular filling pressure. - Aortic valve: There was trivial regurgitation. - Aortic root: The aortic root was mildly dilated. - Mitral valve: Calcified annulus. - Left atrium: The atrium was moderately dilated. - Right atrium: The atrium was mildly dilated. - Impressions: Normal LV function; cannot R/O focal wall motion abnormality; mild to moderate LVH; grade 2 diastolic dysfunction; biatrial enlargement; trace AI.  Nuclear stress test 01/07/13: The study report from November 2011 suggests some inferior ischemia. The end-diastolic volume was A999333 mL and the ejection fraction was 59% at that time. The ventricle is larger now by this study. The  end-diastolic volume is 0000000 mL. Ejection fraction is now 43%. There may be mild inferior scar. There is no marked ischemia. This is a low/moderate risk scan. LV Ejection Fraction: 43%. LV Wall Motion: Mild global hypokinesis. Slightly more marked in the inferior wall. - Dr. Stanford Breed recommended medical therapy.   If no changes, I anticipate pt can proceed with surgery as scheduled.   Willeen Cass, FNP-BC [facility]/Anesthesiology Phone: [phone] 08/27/2015 12:30 PM

## 2015-08-27 NOTE — Progress Notes (Signed)
Please call IR at # [phone] once pt arrives to [facility] for central line placement ( per Malachy Mood, Surgical Scheduler for Dr. Sharol Given).

## 2015-08-28 ENCOUNTER — Inpatient Hospital Stay (HOSPITAL_COMMUNITY)
Admission: RE | Admit: 2015-08-28 | Discharge: 2015-08-31 | DRG: 464 | Disposition: A | Discharge status: Home Health | Payer: Medicare Other | Source: Ambulatory Visit | Attending: Orthopedic Surgery | Admitting: Orthopedic Surgery

## 2015-08-28 ENCOUNTER — Encounter (HOSPITAL_COMMUNITY)
Admission: RE | Disposition: A | Discharge status: Home Health | Payer: Self-pay | Source: Ambulatory Visit | Attending: Orthopedic Surgery

## 2015-08-28 ENCOUNTER — Inpatient Hospital Stay (HOSPITAL_COMMUNITY): Payer: Medicare Other | Admitting: Emergency Medicine

## 2015-08-28 ENCOUNTER — Ambulatory Visit (HOSPITAL_COMMUNITY)
Admission: RE | Admit: 2015-08-28 | Discharge: 2015-08-28 | Disposition: A | Discharge status: Home / Self Care | Payer: Medicare Other | Source: Ambulatory Visit | Attending: Orthopedic Surgery | Admitting: Orthopedic Surgery

## 2015-08-28 ENCOUNTER — Encounter (HOSPITAL_COMMUNITY): Payer: Self-pay | Admitting: *Deleted

## 2015-08-28 ENCOUNTER — Other Ambulatory Visit (HOSPITAL_COMMUNITY): Payer: Self-pay | Admitting: Orthopedic Surgery

## 2015-08-28 DIAGNOSIS — I5042 Chronic combined systolic (congestive) and diastolic (congestive) heart failure: Secondary | ICD-10-CM | POA: Diagnosis present

## 2015-08-28 DIAGNOSIS — I2510 Atherosclerotic heart disease of native coronary artery without angina pectoris: Secondary | ICD-10-CM | POA: Diagnosis not present

## 2015-08-28 DIAGNOSIS — Z951 Presence of aortocoronary bypass graft: Secondary | ICD-10-CM

## 2015-08-28 DIAGNOSIS — Z96649 Presence of unspecified artificial hip joint: Secondary | ICD-10-CM

## 2015-08-28 DIAGNOSIS — Z452 Encounter for adjustment and management of vascular access device: Secondary | ICD-10-CM | POA: Diagnosis not present

## 2015-08-28 DIAGNOSIS — E1165 Type 2 diabetes mellitus with hyperglycemia: Secondary | ICD-10-CM | POA: Diagnosis not present

## 2015-08-28 DIAGNOSIS — Y792 Prosthetic and other implants, materials and accessory orthopedic devices associated with adverse incidents: Secondary | ICD-10-CM | POA: Diagnosis present

## 2015-08-28 DIAGNOSIS — Z6841 Body Mass Index (BMI) 40.0 and over, adult: Secondary | ICD-10-CM | POA: Diagnosis not present

## 2015-08-28 DIAGNOSIS — Z96659 Presence of unspecified artificial knee joint: Secondary | ICD-10-CM

## 2015-08-28 DIAGNOSIS — X58XXXA Exposure to other specified factors, initial encounter: Secondary | ICD-10-CM | POA: Diagnosis not present

## 2015-08-28 DIAGNOSIS — F418 Other specified anxiety disorders: Secondary | ICD-10-CM | POA: Diagnosis present

## 2015-08-28 DIAGNOSIS — I110 Hypertensive heart disease with heart failure: Secondary | ICD-10-CM | POA: Diagnosis not present

## 2015-08-28 DIAGNOSIS — E1122 Type 2 diabetes mellitus with diabetic chronic kidney disease: Secondary | ICD-10-CM | POA: Diagnosis not present

## 2015-08-28 DIAGNOSIS — N179 Acute kidney failure, unspecified: Secondary | ICD-10-CM | POA: Diagnosis not present

## 2015-08-28 DIAGNOSIS — T84018S Broken internal joint prosthesis, other site, sequela: Secondary | ICD-10-CM

## 2015-08-28 DIAGNOSIS — M00862 Arthritis due to other bacteria, left knee: Secondary | ICD-10-CM | POA: Diagnosis not present

## 2015-08-28 DIAGNOSIS — Z79899 Other long term (current) drug therapy: Secondary | ICD-10-CM

## 2015-08-28 DIAGNOSIS — T8454XA Infection and inflammatory reaction due to internal left knee prosthesis, initial encounter: Secondary | ICD-10-CM | POA: Diagnosis not present

## 2015-08-28 DIAGNOSIS — G8918 Other acute postprocedural pain: Secondary | ICD-10-CM | POA: Diagnosis not present

## 2015-08-28 DIAGNOSIS — K219 Gastro-esophageal reflux disease without esophagitis: Secondary | ICD-10-CM | POA: Diagnosis present

## 2015-08-28 DIAGNOSIS — E1100 Type 2 diabetes mellitus with hyperosmolarity without nonketotic hyperglycemic-hyperosmolar coma (NKHHC): Secondary | ICD-10-CM | POA: Diagnosis not present

## 2015-08-28 DIAGNOSIS — E119 Type 2 diabetes mellitus without complications: Secondary | ICD-10-CM

## 2015-08-28 DIAGNOSIS — T8454XD Infection and inflammatory reaction due to internal left knee prosthesis, subsequent encounter: Secondary | ICD-10-CM | POA: Diagnosis not present

## 2015-08-28 DIAGNOSIS — T84018A Broken internal joint prosthesis, other site, initial encounter: Secondary | ICD-10-CM | POA: Diagnosis not present

## 2015-08-28 DIAGNOSIS — T84093A Other mechanical complication of internal left knee prosthesis, initial encounter: Secondary | ICD-10-CM | POA: Diagnosis not present

## 2015-08-28 DIAGNOSIS — E871 Hypo-osmolality and hyponatremia: Secondary | ICD-10-CM | POA: Diagnosis not present

## 2015-08-28 DIAGNOSIS — IMO0002 Reserved for concepts with insufficient information to code with codable children: Secondary | ICD-10-CM

## 2015-08-28 HISTORY — DX: Unspecified infectious disease: B99.9

## 2015-08-28 HISTORY — PX: I & D KNEE WITH POLY EXCHANGE: SHX5024

## 2015-08-28 LAB — AEROBIC/ANAEROBIC CULTURE (SURGICAL/DEEP WOUND)

## 2015-08-28 LAB — POCT I-STAT 4, (NA,K, GLUC, HGB,HCT)
Glucose, Bld: 133 mg/dL — ABNORMAL HIGH (ref 65–99)
HEMATOCRIT: 27 % — AB (ref 39.0–52.0)
Hemoglobin: 9.2 g/dL — ABNORMAL LOW (ref 13.0–17.0)
Potassium: 4.4 mmol/L (ref 3.5–5.1)
SODIUM: 139 mmol/L (ref 135–145)

## 2015-08-28 LAB — AEROBIC/ANAEROBIC CULTURE W GRAM STAIN (SURGICAL/DEEP WOUND)

## 2015-08-28 LAB — PREPARE RBC (CROSSMATCH)

## 2015-08-28 SURGERY — IRRIGATION AND DEBRIDEMENT KNEE WITH POLY EXCHANGE
Anesthesia: Regional | Laterality: Left

## 2015-08-28 MED ORDER — FENTANYL CITRATE (PF) 250 MCG/5ML IJ SOLN
INTRAMUSCULAR | Status: AC
  Filled 2015-08-28: qty 5

## 2015-08-28 MED ORDER — LACTATED RINGERS IV SOLN
INTRAVENOUS | Status: DC
  Administered 2015-08-28: 12:00:00 via INTRAVENOUS

## 2015-08-28 MED ORDER — LIDOCAINE HCL (CARDIAC) 20 MG/ML IV SOLN
INTRAVENOUS | Status: DC | PRN
  Administered 2015-08-28: 100 mg via INTRAVENOUS

## 2015-08-28 MED ORDER — GLYCOPYRROLATE 0.2 MG/ML IJ SOLN
INTRAMUSCULAR | Status: DC | PRN
  Administered 2015-08-28 (×2): .2 mg via INTRAVENOUS
  Administered 2015-08-28: 0.4 mg via INTRAVENOUS

## 2015-08-28 MED ORDER — GENTAMICIN SULFATE 40 MG/ML IJ SOLN
INTRAMUSCULAR | Status: DC | PRN
  Administered 2015-08-28: 240 mg

## 2015-08-28 MED ORDER — POLYETHYLENE GLYCOL 3350 17 G PO PACK: 17.0000 g | PACK | Freq: Every day | ORAL | Status: DC | PRN

## 2015-08-28 MED ORDER — 0.9 % SODIUM CHLORIDE (POUR BTL) OPTIME
TOPICAL | Status: DC | PRN
  Administered 2015-08-28: 1000 mL

## 2015-08-28 MED ORDER — ATORVASTATIN CALCIUM 40 MG PO TABS
40.0000 mg | ORAL_TABLET | Freq: Every day | ORAL | Status: DC
  Administered 2015-08-28 – 2015-08-31 (×4): 40 mg via ORAL
  Filled 2015-08-28 (×4): qty 1

## 2015-08-28 MED ORDER — ALBUMIN HUMAN 5 % IV SOLN
12.5000 g | Freq: Once | INTRAVENOUS | Status: AC
  Administered 2015-08-28: 12.5 g via INTRAVENOUS

## 2015-08-28 MED ORDER — FINASTERIDE 5 MG PO TABS
5.0000 mg | ORAL_TABLET | Freq: Every day | ORAL | Status: DC
  Administered 2015-08-29 – 2015-08-31 (×3): 5 mg via ORAL
  Filled 2015-08-28 (×3): qty 1

## 2015-08-28 MED ORDER — ACETAMINOPHEN 650 MG RE SUPP: 650.0000 mg | Freq: Four times a day (QID) | RECTAL | Status: DC | PRN

## 2015-08-28 MED ORDER — METHOCARBAMOL 500 MG PO TABS
500.0000 mg | ORAL_TABLET | Freq: Four times a day (QID) | ORAL | Status: DC | PRN
  Administered 2015-08-28 – 2015-08-30 (×3): 500 mg via ORAL
  Filled 2015-08-28 (×3): qty 1

## 2015-08-28 MED ORDER — METOCLOPRAMIDE HCL 5 MG/ML IJ SOLN: 5.0000 mg | Freq: Three times a day (TID) | INTRAMUSCULAR | Status: DC | PRN

## 2015-08-28 MED ORDER — SODIUM CHLORIDE 0.9 % IV SOLN: INTRAVENOUS | Status: DC

## 2015-08-28 MED ORDER — PHENYLEPHRINE HCL 10 MG/ML IJ SOLN
INTRAMUSCULAR | Status: DC | PRN
  Administered 2015-08-28 (×3): 80 ug via INTRAVENOUS

## 2015-08-28 MED ORDER — FENTANYL CITRATE (PF) 100 MCG/2ML IJ SOLN
INTRAMUSCULAR | Status: AC
  Administered 2015-08-28: 100 ug
  Filled 2015-08-28: qty 2

## 2015-08-28 MED ORDER — CHLORHEXIDINE GLUCONATE 4 % EX LIQD: 60.0000 mL | Freq: Once | CUTANEOUS | Status: DC

## 2015-08-28 MED ORDER — ROCURONIUM BROMIDE 100 MG/10ML IV SOLN
INTRAVENOUS | Status: DC | PRN
  Administered 2015-08-28: 40 mg via INTRAVENOUS

## 2015-08-28 MED ORDER — BISACODYL 10 MG RE SUPP: 10.0000 mg | Freq: Every day | RECTAL | Status: DC | PRN

## 2015-08-28 MED ORDER — ALBUMIN HUMAN 5 % IV SOLN: INTRAVENOUS | Status: DC | PRN

## 2015-08-28 MED ORDER — MENTHOL 3 MG MT LOZG: 1.0000 | LOZENGE | OROMUCOSAL | Status: DC | PRN

## 2015-08-28 MED ORDER — CEFAZOLIN SODIUM-DEXTROSE 2-4 GM/100ML-% IV SOLN
2.0000 g | Freq: Three times a day (TID) | INTRAVENOUS | Status: DC
  Administered 2015-08-28 – 2015-08-31 (×9): 2 g via INTRAVENOUS
  Filled 2015-08-28 (×11): qty 100

## 2015-08-28 MED ORDER — LORAZEPAM 1 MG PO TABS
1.0000 mg | ORAL_TABLET | Freq: Three times a day (TID) | ORAL | Status: DC | PRN
  Administered 2015-08-28: 1 mg via ORAL
  Filled 2015-08-28: qty 1

## 2015-08-28 MED ORDER — ONDANSETRON HCL 4 MG/2ML IJ SOLN
INTRAMUSCULAR | Status: AC
  Filled 2015-08-28: qty 2

## 2015-08-28 MED ORDER — MIDAZOLAM HCL 2 MG/2ML IJ SOLN
INTRAMUSCULAR | Status: AC
  Filled 2015-08-28: qty 2

## 2015-08-28 MED ORDER — PROPOFOL 10 MG/ML IV BOLUS
INTRAVENOUS | Status: AC
  Filled 2015-08-28: qty 20

## 2015-08-28 MED ORDER — FENTANYL CITRATE (PF) 100 MCG/2ML IJ SOLN
INTRAMUSCULAR | Status: DC | PRN
  Administered 2015-08-28: 100 ug via INTRAVENOUS
  Administered 2015-08-28: 150 ug via INTRAVENOUS

## 2015-08-28 MED ORDER — LIDOCAINE HCL 1 % IJ SOLN
INTRAMUSCULAR | Status: AC
  Filled 2015-08-28: qty 20

## 2015-08-28 MED ORDER — DOCUSATE SODIUM 100 MG PO CAPS
100.0000 mg | ORAL_CAPSULE | Freq: Two times a day (BID) | ORAL | Status: DC
  Administered 2015-08-28 – 2015-08-31 (×6): 100 mg via ORAL
  Filled 2015-08-28 (×6): qty 1

## 2015-08-28 MED ORDER — TRANEXAMIC ACID 1000 MG/10ML IV SOLN
2000.0000 mg | INTRAVENOUS | Status: AC
  Administered 2015-08-28: 2000 mg via TOPICAL
  Filled 2015-08-28: qty 20

## 2015-08-28 MED ORDER — ALFUZOSIN HCL ER 10 MG PO TB24
10.0000 mg | ORAL_TABLET | Freq: Every day | ORAL | Status: DC
  Administered 2015-08-29 – 2015-08-31 (×3): 10 mg via ORAL
  Filled 2015-08-28 (×3): qty 1

## 2015-08-28 MED ORDER — DOCUSATE SODIUM 100 MG PO CAPS: 100.0000 mg | ORAL_CAPSULE | Freq: Two times a day (BID) | ORAL | Status: DC

## 2015-08-28 MED ORDER — BUPIVACAINE-EPINEPHRINE (PF) 0.5% -1:200000 IJ SOLN
INTRAMUSCULAR | Status: DC | PRN
  Administered 2015-08-28: 30 mL via PERINEURAL

## 2015-08-28 MED ORDER — PROPOFOL 10 MG/ML IV BOLUS
INTRAVENOUS | Status: DC | PRN
  Administered 2015-08-28: 40 mg via INTRAVENOUS
  Administered 2015-08-28: 150 mg via INTRAVENOUS

## 2015-08-28 MED ORDER — LISINOPRIL 20 MG PO TABS
20.0000 mg | ORAL_TABLET | Freq: Every day | ORAL | Status: DC
  Administered 2015-08-30 – 2015-08-31 (×2): 20 mg via ORAL
  Filled 2015-08-28 (×3): qty 1

## 2015-08-28 MED ORDER — ALBUMIN HUMAN 5 % IV SOLN
INTRAVENOUS | Status: AC
  Filled 2015-08-28: qty 250

## 2015-08-28 MED ORDER — TAMSULOSIN HCL 0.4 MG PO CAPS
0.4000 mg | ORAL_CAPSULE | Freq: Two times a day (BID) | ORAL | Status: DC
  Administered 2015-08-28 – 2015-08-31 (×6): 0.4 mg via ORAL
  Filled 2015-08-28 (×7): qty 1

## 2015-08-28 MED ORDER — ONDANSETRON HCL 4 MG/2ML IJ SOLN: 4.0000 mg | Freq: Once | INTRAMUSCULAR | Status: DC | PRN

## 2015-08-28 MED ORDER — SODIUM CHLORIDE 0.9 % IR SOLN: Status: DC | PRN

## 2015-08-28 MED ORDER — HYDROMORPHONE HCL 1 MG/ML IJ SOLN
INTRAMUSCULAR | Status: AC
  Filled 2015-08-28: qty 1

## 2015-08-28 MED ORDER — VANCOMYCIN HCL 1000 MG IV SOLR
INTRAVENOUS | Status: AC
  Filled 2015-08-28: qty 1000

## 2015-08-28 MED ORDER — LIDOCAINE 2% (20 MG/ML) 5 ML SYRINGE
INTRAMUSCULAR | Status: AC
  Filled 2015-08-28: qty 10

## 2015-08-28 MED ORDER — OXYCODONE HCL 5 MG PO TABS
5.0000 mg | ORAL_TABLET | ORAL | Status: DC | PRN
  Administered 2015-08-28 – 2015-08-29 (×2): 10 mg via ORAL
  Administered 2015-08-29: 5 mg via ORAL
  Administered 2015-08-29 – 2015-08-30 (×3): 10 mg via ORAL
  Filled 2015-08-28: qty 1
  Filled 2015-08-28 (×5): qty 2

## 2015-08-28 MED ORDER — METOCLOPRAMIDE HCL 5 MG PO TABS: 5.0000 mg | ORAL_TABLET | Freq: Three times a day (TID) | ORAL | Status: DC | PRN

## 2015-08-28 MED ORDER — FENTANYL CITRATE (PF) 100 MCG/2ML IJ SOLN: 25.0000 ug | INTRAMUSCULAR | Status: DC | PRN

## 2015-08-28 MED ORDER — ALBUMIN HUMAN 5 % IV SOLN
INTRAVENOUS | Status: DC | PRN
  Administered 2015-08-28: 14:00:00 via INTRAVENOUS

## 2015-08-28 MED ORDER — LIDOCAINE 2% (20 MG/ML) 5 ML SYRINGE
INTRAMUSCULAR | Status: AC
  Filled 2015-08-28: qty 5

## 2015-08-28 MED ORDER — NEOSTIGMINE METHYLSULFATE 10 MG/10ML IV SOLN
INTRAVENOUS | Status: DC | PRN
  Administered 2015-08-28: 3 mg via INTRAVENOUS

## 2015-08-28 MED ORDER — ROCURONIUM BROMIDE 50 MG/5ML IV SOLN
INTRAVENOUS | Status: AC
  Filled 2015-08-28: qty 2

## 2015-08-28 MED ORDER — EPHEDRINE SULFATE 50 MG/ML IJ SOLN
INTRAMUSCULAR | Status: DC | PRN
  Administered 2015-08-28 (×5): 10 mg via INTRAVENOUS

## 2015-08-28 MED ORDER — KETOROLAC TROMETHAMINE 15 MG/ML IJ SOLN
7.5000 mg | Freq: Four times a day (QID) | INTRAMUSCULAR | Status: AC
  Administered 2015-08-29 (×3): 7.5 mg via INTRAVENOUS
  Filled 2015-08-28 (×3): qty 1

## 2015-08-28 MED ORDER — ONDANSETRON HCL 4 MG PO TABS: 4.0000 mg | ORAL_TABLET | Freq: Four times a day (QID) | ORAL | Status: DC | PRN

## 2015-08-28 MED ORDER — CARVEDILOL 12.5 MG PO TABS
12.5000 mg | ORAL_TABLET | Freq: Two times a day (BID) | ORAL | Status: DC
  Administered 2015-08-29 – 2015-08-31 (×5): 12.5 mg via ORAL
  Filled 2015-08-28 (×5): qty 1

## 2015-08-28 MED ORDER — CLINDAMYCIN PHOSPHATE 600 MG/50ML IV SOLN
600.0000 mg | Freq: Four times a day (QID) | INTRAVENOUS | Status: AC
  Administered 2015-08-28 – 2015-08-29 (×2): 600 mg via INTRAVENOUS
  Filled 2015-08-28 (×2): qty 50

## 2015-08-28 MED ORDER — PANTOPRAZOLE SODIUM 40 MG PO TBEC
40.0000 mg | DELAYED_RELEASE_TABLET | Freq: Two times a day (BID) | ORAL | Status: DC
  Administered 2015-08-28 – 2015-08-31 (×6): 40 mg via ORAL
  Filled 2015-08-28 (×6): qty 1

## 2015-08-28 MED ORDER — PHENOL 1.4 % MT LIQD: 1.0000 | OROMUCOSAL | Status: DC | PRN

## 2015-08-28 MED ORDER — GENTAMICIN SULFATE 40 MG/ML IJ SOLN
INTRAMUSCULAR | Status: AC
  Filled 2015-08-28: qty 6

## 2015-08-28 MED ORDER — PHENYLEPHRINE HCL 10 MG/ML IJ SOLN
10.0000 mg | INTRAVENOUS | Status: DC | PRN
  Administered 2015-08-28: 20 ug/min via INTRAVENOUS

## 2015-08-28 MED ORDER — MAGNESIUM CITRATE PO SOLN: 1.0000 | Freq: Once | ORAL | Status: DC | PRN

## 2015-08-28 MED ORDER — FLUOXETINE HCL 20 MG PO CAPS
40.0000 mg | ORAL_CAPSULE | Freq: Every day | ORAL | Status: DC
  Administered 2015-08-29 – 2015-08-31 (×3): 40 mg via ORAL
  Filled 2015-08-28 (×4): qty 2

## 2015-08-28 MED ORDER — ROCURONIUM BROMIDE 50 MG/5ML IV SOLN
INTRAVENOUS | Status: AC
  Filled 2015-08-28: qty 1

## 2015-08-28 MED ORDER — VANCOMYCIN HCL 1000 MG IV SOLR
INTRAVENOUS | Status: DC | PRN
  Administered 2015-08-28: 1000 mg

## 2015-08-28 MED ORDER — GABAPENTIN 300 MG PO CAPS
300.0000 mg | ORAL_CAPSULE | Freq: Two times a day (BID) | ORAL | Status: DC
  Administered 2015-08-28 – 2015-08-31 (×6): 300 mg via ORAL
  Filled 2015-08-28 (×6): qty 1

## 2015-08-28 MED ORDER — ASPIRIN EC 325 MG PO TBEC
325.0000 mg | DELAYED_RELEASE_TABLET | Freq: Every day | ORAL | Status: DC
  Administered 2015-08-29 – 2015-08-31 (×3): 325 mg via ORAL
  Filled 2015-08-28 (×3): qty 1

## 2015-08-28 MED ORDER — HYDROMORPHONE HCL 1 MG/ML IJ SOLN
1.0000 mg | INTRAMUSCULAR | Status: DC | PRN
  Administered 2015-08-28: 1 mg via INTRAVENOUS

## 2015-08-28 MED ORDER — METHOCARBAMOL 1000 MG/10ML IJ SOLN
500.0000 mg | Freq: Four times a day (QID) | INTRAMUSCULAR | Status: DC | PRN
  Filled 2015-08-28: qty 5

## 2015-08-28 MED ORDER — ACETAMINOPHEN 325 MG PO TABS: 650.0000 mg | ORAL_TABLET | Freq: Four times a day (QID) | ORAL | Status: DC | PRN

## 2015-08-28 MED ORDER — ONDANSETRON HCL 4 MG/2ML IJ SOLN: 4.0000 mg | Freq: Four times a day (QID) | INTRAMUSCULAR | Status: DC | PRN

## 2015-08-28 SURGICAL SUPPLY — 50 items
BLADE SURG 10 STRL SS (BLADE) IMPLANT
BLADE SURG 21 STRL SS (BLADE) ×3 IMPLANT
BNDG COHESIVE 4X5 TAN STRL (GAUZE/BANDAGES/DRESSINGS) ×3 IMPLANT
BNDG COHESIVE 6X5 TAN STRL LF (GAUZE/BANDAGES/DRESSINGS) ×3 IMPLANT
BNDG GAUZE ELAST 4 BULKY (GAUZE/BANDAGES/DRESSINGS) ×6 IMPLANT
COVER SURGICAL LIGHT HANDLE (MISCELLANEOUS) ×6 IMPLANT
DRAPE U-SHAPE 47X51 STRL (DRAPES) ×3 IMPLANT
DRSG ADAPTIC 3X8 NADH LF (GAUZE/BANDAGES/DRESSINGS) ×3 IMPLANT
DRSG PAD ABDOMINAL 8X10 ST (GAUZE/BANDAGES/DRESSINGS) ×6 IMPLANT
DURAPREP 26ML APPLICATOR (WOUND CARE) ×3 IMPLANT
ELECT CAUTERY BLADE 6.4 (BLADE) IMPLANT
ELECT REM PT RETURN 9FT ADLT (ELECTROSURGICAL)
ELECTRODE REM PT RTRN 9FT ADLT (ELECTROSURGICAL) IMPLANT
GAUZE SPONGE 4X4 12PLY STRL (GAUZE/BANDAGES/DRESSINGS) ×3 IMPLANT
GLOVE BIOGEL PI IND STRL 9 (GLOVE) ×1 IMPLANT
GLOVE BIOGEL PI INDICATOR 9 (GLOVE) ×2
GLOVE SURG ORTHO 9.0 STRL STRW (GLOVE) ×3 IMPLANT
GOWN STRL REUS W/ TWL LRG LVL3 (GOWN DISPOSABLE) ×1 IMPLANT
GOWN STRL REUS W/ TWL XL LVL3 (GOWN DISPOSABLE) ×2 IMPLANT
GOWN STRL REUS W/TWL LRG LVL3 (GOWN DISPOSABLE) ×2
GOWN STRL REUS W/TWL XL LVL3 (GOWN DISPOSABLE) ×4
HANDPIECE INTERPULSE COAX TIP (DISPOSABLE)
IMMOBILIZER KNEE 22 UNIV (SOFTGOODS) ×3 IMPLANT
KIT BASIN OR (CUSTOM PROCEDURE TRAY) ×3 IMPLANT
KIT ROOM TURNOVER OR (KITS) ×3 IMPLANT
KIT STIMULAN RAPID CURE  10CC (Orthopedic Implant) ×2 IMPLANT
KIT STIMULAN RAPID CURE 10CC (Orthopedic Implant) ×1 IMPLANT
MANIFOLD NEPTUNE II (INSTRUMENTS) ×3 IMPLANT
NS IRRIG 1000ML POUR BTL (IV SOLUTION) ×3 IMPLANT
PACK ORTHO EXTREMITY (CUSTOM PROCEDURE TRAY) ×3 IMPLANT
PAD ARMBOARD 7.5X6 YLW CONV (MISCELLANEOUS) ×6 IMPLANT
PLATE ROT INSERT 12.5MM SIZE 4 (Plate) ×3 IMPLANT
PREVENA INCISION MGT 90 150 (MISCELLANEOUS) ×3 IMPLANT
SET HNDPC FAN SPRY TIP SCT (DISPOSABLE) IMPLANT
SPONGE LAP 18X18 X RAY DECT (DISPOSABLE) ×6 IMPLANT
STOCKINETTE IMPERVIOUS 9X36 MD (GAUZE/BANDAGES/DRESSINGS) IMPLANT
SUT ETHILON 2 0 PSLX (SUTURE) ×6 IMPLANT
SUT FIBERWIRE #2 38 REV NDL BL (SUTURE) ×3
SUT VIC AB 1 CT1 27 (SUTURE) ×2
SUT VIC AB 1 CT1 27XBRD ANBCTR (SUTURE) ×1 IMPLANT
SUTURE FIBERWR#2 38 REV NDL BL (SUTURE) ×1 IMPLANT
SWAB COLLECTION DEVICE MRSA (MISCELLANEOUS) ×3 IMPLANT
TOWEL OR 17X24 6PK STRL BLUE (TOWEL DISPOSABLE) ×3 IMPLANT
TOWEL OR 17X26 10 PK STRL BLUE (TOWEL DISPOSABLE) ×3 IMPLANT
TUBE ANAEROBIC SPECIMEN COL (MISCELLANEOUS) IMPLANT
TUBE CONNECTING 12'X1/4 (SUCTIONS) ×1
TUBE CONNECTING 12X1/4 (SUCTIONS) ×2 IMPLANT
UNDERPAD 30X30 INCONTINENT (UNDERPADS AND DIAPERS) ×3 IMPLANT
WATER STERILE IRR 1000ML POUR (IV SOLUTION) ×3 IMPLANT
YANKAUER SUCT BULB TIP NO VENT (SUCTIONS) ×3 IMPLANT

## 2015-08-28 NOTE — Progress Notes (Signed)
Spoke with Estill Bamberg at [facility] and made aware of patient needing to be DC'ed on 6 weeks IV antibiotics.

## 2015-08-28 NOTE — Progress Notes (Signed)
Patient from [place] , alert and oriented VSS

## 2015-08-28 NOTE — Progress Notes (Signed)
Orthopedic Tech Progress Note Patient Details:  Steven Charles 1945-06-28 [id number]  Ortho Devices Type of Ortho Device: Knee Immobilizer Ortho Device/Splint Location: LLE Ortho Device/Splint Interventions: Ordered, Application replacement  Braulio Bosch 08/28/2015, 8:09 PM

## 2015-08-28 NOTE — Progress Notes (Signed)
Infection prevention called back, does not need to be on contact precautions.

## 2015-08-28 NOTE — Consult Note (Signed)
Pharmacy Antibiotic Note  Steven Charles is a 70 y.o. male admitted on 08/28/2015 with left knee prosthetic joint infection s/p I&D on 5/27 and  s/p I&D with poly exchange today. Cultures growing staph lugdunensis, oxacillin sensitive. Pharmacy has been consulted for cefazolin dosing. Noted plan per ID for 6 weeks of IV therapy. Last BMET 5/27 - sCr 1.18.  Received cefazolin 3g x 1 @ 1302.  Plan: 1) Cefazolin 2g IV q8  Height: 5\' 10"  (177.8 cm) Weight: 278 lb (126.1 kg) IBW/kg (Calculated) : 73  Temp (24hrs), Avg:97.4 F (36.3 C), Min:97 F (36.1 C), Max:98.1 F (36.7 C)   Recent Labs Lab 08/22/15 1341  WBC 9.1  CREATININE 1.18    Estimated Creatinine Clearance: 75.4 mL/min (by C-G formula based on Cr of 1.18).    Allergies  Allergen Reactions  . Nicoderm [Nicotine] Other (See Comments)    Heart rate dropped   . Adhesive [Tape] Itching and Rash    Please use "paper" tape    Antimicrobials this admission: 6/2 Cefazolin >> 6/2 IV Clindamycin x 2 doses  Dose adjustments this admission: n/a  Microbiology results: 5/27 left knee synovial fluid x 3 >> staph lugdunensis (oxacillin sensitive) 6/2 left knee synovial fluid >>  Thank you for allowing pharmacy to be a part of this patient's care.  Deboraha Sprang 08/28/2015 7:08 PM

## 2015-08-28 NOTE — Progress Notes (Signed)
Okay to use PICC per Dr. Gifford Shave.

## 2015-08-28 NOTE — Procedures (Signed)
Interventional Radiology Procedure Note  Procedure: Placement of a right basilic vein Double-lumen PICC, power injectable.   Tip is positioned at the superior cavoatrial junction and catheter is ready for immediate use.  Complications: None Recommendations:  - Ok to shower tomorrow - Do not submerge  - Routine line care - OK to use catheter   Signed,  Dulcy Fanny. Earleen Newport, DO

## 2015-08-28 NOTE — Transfer of Care (Signed)
Immediate Anesthesia Transfer of Care Note  Patient: Steven Charles  Procedure(s) Performed: Procedure(s): Poly Exchange Left Knee (Left)  Patient Location: PACU  Anesthesia Type:General and Regional  Level of Consciousness: awake, alert  and oriented  Airway & Oxygen Therapy: Patient Spontanous Breathing  Post-op Assessment: Report given to RN and Post -op Vital signs reviewed and stable  Post vital signs: Reviewed and stable  Last Vitals:  Filed Vitals:   08/28/15 1235 08/28/15 1240  BP: 125/28 110/32  Pulse: 51 48  Temp:    Resp: 14 10    Last Pain: There were no vitals filed for this visit.       Complications: No apparent anesthesia complications

## 2015-08-28 NOTE — Anesthesia Procedure Notes (Addendum)
Anesthesia Regional Block:  Adductor canal block  Pre-Anesthetic Checklist: ,, timeout performed, Correct Patient, Correct Site, Correct Laterality, Correct Procedure, Correct Position, site marked, Risks and benefits discussed,  Surgical consent,  Pre-op evaluation,  At surgeon's request and post-op pain management  Laterality: Left  Prep: chloraprep       Needles:  Injection technique: Single-shot  Needle Type: Echogenic Needle     Needle Length: 9cm 9 cm Needle Gauge: 21 and 21 G    Additional Needles:  Procedures: ultrasound guided (picture in chart) Adductor canal block Narrative:  Injection made incrementally with aspirations every 5 mL.  Performed by: Personally  Anesthesiologist: Catalina Gravel  Additional Notes: No pain on injection. No increased resistance to injection. Injection made in 5cc increments.  Good needle visualization.  Patient tolerated procedure well.   Procedure Name: Intubation Date/Time: 08/28/2015 1:04 PM Performed by: Manuela Schwartz B Pre-anesthesia Checklist: Patient identified, Emergency Drugs available, Patient being monitored, Suction available and Timeout performed Patient Re-evaluated:Patient Re-evaluated prior to inductionOxygen Delivery Method: Circle system utilized Preoxygenation: Pre-oxygenation with 100% oxygen Intubation Type: IV induction Ventilation: Mask ventilation without difficulty Laryngoscope Size: Miller and 2 Grade View: Grade I Tube type: Oral Tube size: 7.5 mm Number of attempts: 1 Airway Equipment and Method: Stylet Placement Confirmation: ETT inserted through vocal cords under direct vision,  positive ETCO2 and breath sounds checked- equal and bilateral Secured at: 22 cm Tube secured with: Tape Dental Injury: Teeth and Oropharynx as per pre-operative assessment
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
## 2015-08-28 NOTE — Progress Notes (Signed)
This nurse called and spoke with Infectious Disease regarding STAPHYLOCOCCUS LUGDUNENSIS in left knee aspirate, questioning whether or not pt should be on contact precautions. Cherri reported that she would call me back within 5 minutes after looking at pt's chart.

## 2015-08-28 NOTE — Anesthesia Preprocedure Evaluation (Addendum)
Anesthesia Evaluation  Patient identified by MRN, date of birth, ID band Patient awake    Reviewed: Allergy & Precautions, NPO status , Patient's Chart, lab work & pertinent test results, reviewed documented beta blocker date and time   Airway Mallampati: II  TM Distance: >3 FB Neck ROM: Full    Dental  (+) Upper Dentures, Dental Advisory Given   Pulmonary sleep apnea , former smoker,    Pulmonary exam normal breath sounds clear to auscultation       Cardiovascular hypertension, Pt. on medications and Pt. on home beta blockers + CAD, + Past MI, + CABG, + Peripheral Vascular Disease and +CHF  Normal cardiovascular exam Rhythm:Regular Rate:Normal  Echo 01/06/14:  - Left ventricle: The cavity size was normal. Wall thickness was increased increased in a pattern of mild to moderate LVH. Systolic function was normal. The estimated ejection fraction was in the range of 50% to 55%. Regional wall motion abnormalities cannot be excluded. Features are consistent with a pseudonormal left ventricular filling pattern, with concomitant abnormal relaxation and increased filling pressure (grade 2 diastolic dysfunction). Doppler parameters are consistent with high ventricular filling pressure. - Aortic valve: There was trivial regurgitation. - Aortic root: The aortic root was mildly dilated. - Mitral valve: Calcified annulus. - Left atrium: The atrium was moderately dilated. - Right atrium: The atrium was mildly dilated. - Impressions: Normal LV function; cannot R/O focal wall motion abnormality; mild to moderate LVH; grade 2 diastolic dysfunction; biatrial enlargement; trace AI.  Nuclear stress test 01/07/13: The study report from November 2011 suggests some inferior ischemia. The end-diastolic volume was [id number] mL and the ejection fraction was 59% at that time. The ventricle is larger now by this study. The end-diastolic volume is 0000000 mL. Ejection fraction  is now 43%. There may be mild inferior scar. There is no marked ischemia. This is a low/moderate risk scan. LV Ejection Fraction: 43%. LV Wall Motion: Mild global hypokinesis. Slightly more marked in the inferior wall. - Dr. Stanford Breed recommended medical therapy.    Neuro/Psych PSYCHIATRIC DISORDERS Anxiety Depression  Neuromuscular disease    GI/Hepatic Neg liver ROS, GERD  Medicated and Controlled,  Endo/Other  Morbid obesity  Renal/GU negative Renal ROS     Musculoskeletal  (+) Arthritis , Osteoarthritis,    Abdominal   Peds  Hematology  (+) Blood dyscrasia, anemia ,   Anesthesia Other Findings Day of surgery medications reviewed with the patient.  Reproductive/Obstetrics                           Anesthesia Physical Anesthesia Plan  ASA: III  Anesthesia Plan: General and Regional   Post-op Pain Management:  Regional for Post-op pain   Induction: Intravenous  Airway Management Planned: Oral ETT  Additional Equipment:   Intra-op Plan:   Post-operative Plan: Extubation in OR  Informed Consent: I have reviewed the patients History and Physical, chart, labs and discussed the procedure including the risks, benefits and alternatives for the proposed anesthesia with the patient or authorized representative who has indicated his/her understanding and acceptance.   Dental advisory given  Plan Discussed with: CRNA  Anesthesia Plan Comments: (Risks/benefits of general anesthesia discussed with patient including risk of damage to teeth, lips, gum, and tongue, nausea/vomiting, allergic reactions to medications, and the possibility of heart attack, stroke and death.  All patient questions answered.  Patient wishes to proceed.  Discussed risks and benefits of adductor canal block including failure, bleeding,  infection, nerve damage, weakness. Discussed that the block may not prevent all of the pain in the knee. Questions answered. Patient consents to  block. )        Anesthesia Quick Evaluation

## 2015-08-28 NOTE — Op Note (Signed)
08/28/2015  2:34 PM  PATIENT:  Steven Charles    PRE-OPERATIVE DIAGNOSIS:  Septic Left Knee  POST-OPERATIVE DIAGNOSIS:  Same  PROCEDURE:  Poly Exchange Left Knee, extensive debridement synovial lining left knee. Cultures obtained intra-articular, placement of antibiotic beads, placement of wound VAC  SURGEON:  Ardian Haberland V, MD  PHYSICIAN ASSISTANT:None ANESTHESIA:   General  PREOPERATIVE INDICATIONS:  Steven Charles is a  70 y.o. male with a diagnosis of Septic Left Knee who failed conservative measures and elected for surgical management.    The risks benefits and alternatives were discussed with the patient preoperatively including but not limited to the risks of infection, bleeding, nerve injury, cardiopulmonary complications, the need for revision surgery, among others, and the patient was willing to proceed.  OPERATIVE IMPLANTS: 12.5 mm polyethylene tray rotating platform,, antibiotic beads 10 mL with 1 g vancomycin and 240 mg gentamicin  OPERATIVE FINDINGS: clear serosanguineous drainage within the knee sent for cultures. No purulence no abscess  OPERATIVE PROCEDURE: patient is a 70 year old gentleman who is several years out from a total knee arthroplasty. Patient developed a prepatellar bursal infection. Patient was taken by Dr. Marlou Sa last week for irrigation and debridement of the prepatellar bursa and placement of antibiotic beads. After 3 days the intra-arterial cultures were positive for coag-negative staph patient presents at this time for polyethylene exchange. Thinking is that this is an acute infection and should be able to be resolved with 6 weeks of IV antibiotics intra-articular antibiotic beads and extensive debridement of the synovial lining. Patient brought the operating room and underwent a general anesthetic. After adequate levels anesthesia obtained patient's left lower extremity was prepped using DuraPrep draped into a sterile field Ioban was used to cover all exposed  skin. A timeout was called. A long midline incision was made this was carried down with a medial parapatellar retinacular incision. Patient has significant adhesions healing at flexion about 20. A quadriceps snip was required to evert the patella. Patient had extremely thick synovial lining and patient underwent extensive a total synovectomy.  This included the popliteal fossa. After extensive debridement the polyethylene tray was removed the knee was irrigated with pulsatile lavage cultures were obtained intra-articular. There is no purulence no signs of any deep abscess. The polyethylene tray was exchanged and the joint was packed with antibiotic beads with 10 mL of stimulant and 1 g vancomycin and 240 mg of gentamicin.knee range of motion was 0-90. The retinaculum was closed using #1 Vicryl this skin was closed using 2-0 nylon a Prevena wound VAC was applied patient was extubated taken the PACU in stable condition.
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
## 2015-08-29 DIAGNOSIS — IMO0002 Reserved for concepts with insufficient information to code with codable children: Secondary | ICD-10-CM

## 2015-08-29 DIAGNOSIS — N179 Acute kidney failure, unspecified: Secondary | ICD-10-CM

## 2015-08-29 DIAGNOSIS — E1122 Type 2 diabetes mellitus with diabetic chronic kidney disease: Secondary | ICD-10-CM

## 2015-08-29 DIAGNOSIS — N182 Chronic kidney disease, stage 2 (mild): Secondary | ICD-10-CM

## 2015-08-29 DIAGNOSIS — E871 Hypo-osmolality and hyponatremia: Secondary | ICD-10-CM

## 2015-08-29 DIAGNOSIS — E1165 Type 2 diabetes mellitus with hyperglycemia: Secondary | ICD-10-CM

## 2015-08-29 DIAGNOSIS — E119 Type 2 diabetes mellitus without complications: Secondary | ICD-10-CM

## 2015-08-29 LAB — CBC WITH DIFFERENTIAL/PLATELET
BASOS ABS: 0 10*3/uL (ref 0.0–0.1)
BASOS PCT: 0 %
EOS ABS: 0.1 10*3/uL (ref 0.0–0.7)
Eosinophils Relative: 1 %
HCT: 25.5 % — ABNORMAL LOW (ref 39.0–52.0)
HEMOGLOBIN: 8.2 g/dL — AB (ref 13.0–17.0)
LYMPHS ABS: 1.3 10*3/uL (ref 0.7–4.0)
Lymphocytes Relative: 17 %
MCH: 27.5 pg (ref 26.0–34.0)
MCHC: 32.2 g/dL (ref 30.0–36.0)
MCV: 85.6 fL (ref 78.0–100.0)
Monocytes Absolute: 0.6 10*3/uL (ref 0.1–1.0)
Monocytes Relative: 8 %
NEUTROS PCT: 74 %
Neutro Abs: 5.7 10*3/uL (ref 1.7–7.7)
Platelets: 171 10*3/uL (ref 150–400)
RBC: 2.98 MIL/uL — AB (ref 4.22–5.81)
RDW: 14.1 % (ref 11.5–15.5)
WBC: 7.7 10*3/uL (ref 4.0–10.5)

## 2015-08-29 LAB — GLUCOSE, CAPILLARY
Glucose-Capillary: 130 mg/dL — ABNORMAL HIGH (ref 65–99)
Glucose-Capillary: 123 mg/dL — ABNORMAL HIGH (ref 65–99)
Glucose-Capillary: 145 mg/dL — ABNORMAL HIGH (ref 65–99)

## 2015-08-29 LAB — BASIC METABOLIC PANEL
Anion gap: 11 (ref 5–15)
BUN: 17 mg/dL (ref 6–20)
CHLORIDE: 89 mmol/L — AB (ref 101–111)
CO2: 24 mmol/L (ref 22–32)
Calcium: 8.1 mg/dL — ABNORMAL LOW (ref 8.9–10.3)
Creatinine, Ser: 1.56 mg/dL — ABNORMAL HIGH (ref 0.61–1.24)
GFR, EST AFRICAN AMERICAN: 49 mL/min — AB (ref >60)
GFR, EST NON AFRICAN AMERICAN: 43 mL/min — AB (ref >60)
Glucose, Bld: 457 mg/dL — ABNORMAL HIGH (ref 65–99)
POTASSIUM: 3.5 mmol/L (ref 3.5–5.1)
SODIUM: 124 mmol/L — AB (ref 135–145)

## 2015-08-29 LAB — HEMOGLOBIN A1C
Hgb A1c MFr Bld: 6.7 % — ABNORMAL HIGH (ref 4.8–5.6)
Mean Plasma Glucose: 146 mg/dL

## 2015-08-29 MED ORDER — INSULIN ASPART 100 UNIT/ML ~~LOC~~ SOLN
0.0000 [IU] | Freq: Three times a day (TID) | SUBCUTANEOUS | Status: DC
  Administered 2015-08-29 – 2015-08-31 (×6): 1 [IU] via SUBCUTANEOUS

## 2015-08-29 MED ORDER — SODIUM CHLORIDE 0.9 % IV SOLN
INTRAVENOUS | Status: AC
  Administered 2015-08-29 – 2015-08-30 (×2): via INTRAVENOUS

## 2015-08-29 MED ORDER — SODIUM CHLORIDE 0.9% FLUSH
10.0000 mL | INTRAVENOUS | Status: DC | PRN
  Administered 2015-08-29 – 2015-08-31 (×2): 10 mL
  Filled 2015-08-29 (×2): qty 40

## 2015-08-29 MED ORDER — INSULIN ASPART 100 UNIT/ML ~~LOC~~ SOLN: 0.0000 [IU] | Freq: Every day | SUBCUTANEOUS | Status: DC

## 2015-08-29 MED ORDER — LIVING WELL WITH DIABETES BOOK
Freq: Once | Status: AC
  Administered 2015-08-29: 17:00:00
  Filled 2015-08-29: qty 1

## 2015-08-29 NOTE — Progress Notes (Signed)
Patient ID: Steven Charles, male   DOB: 07-May-1945, 70 y.o.   MRN: [id number] Thank you for infectious disease consultation and recommendations. Patient is comfortable this morning. Wound VAC is functioning well. Plan for physical therapy and discharged to home on IV antibiotics once he is safe with therapy. Patient denies a history of diabetes however his blood glucoses over 400. I will consult the hospitalist to begin treatment for his diabetes.

## 2015-08-29 NOTE — Clinical Social Work Note (Signed)
CSW consulted for SNF placement. CSW met with patient and wife at bedside. Both state that the patient will return to home at time of discharge. Patient not agreeable to SNF. CSW signing off at this time.  

## 2015-08-29 NOTE — Care Management Note (Signed)
Case Management Note  Patient Details  Name: Steven Charles MRN: [id number] Date of Birth: [birth year]  Subjective/Objective:    70 yr old gentleman, s/p Poly Exchange Left Knee, extensive debridement synovial lining left knee., placement of antibiotic beads, placement of wound VAC          Action/Plan:  Case manager spoke with patient and family at the bedside, concerning Home Health needs at discharge.Patient will go home on IV antibiotics for 6 weeks and will have PT.  Choice was offered for home health agencies. Patient states he has used [facility], requests to do so now. Referral was called to [place], [facility]. Patient's wife states they have rolling walkers, 3in1 and wheelchair. Patient has good family support.  Expected Discharge Date:    to be determined.             Expected Discharge Plan:  [place]  In-House Referral:     Discharge planning Services  CM Consult  Post Acute Care Choice:  Home Health Choice offered to:  Patient  DME Arranged:  IV pump/equipment DME Agency:  [facility] Arranged:  RN, PT [facility] Agency:  [place]  Status of Service:  In process, will continue to follow  Medicare Important Message Given:    Date Medicare IM Given:    Medicare IM give by:    Date Additional Medicare IM Given:    Additional Medicare Important Message give by:     If discussed at [place] of Stay Meetings, dates discussed:    Additional Comments:  Ninfa Meeker, RN 08/29/2015, 11:59 AM
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
## 2015-08-29 NOTE — Evaluation (Signed)
Physical Therapy Evaluation Patient Details Name: Steven Charles MRN: [id number] DOB: Oct 01, 1945 Today's Date: 08/29/2015   History of Present Illness  Patient is a 70 year old gentleman who is several years status post total knee arthroplasty developed a prepatellar bursal infection. Patient initially underwent irrigation debridement and placement of antibiotic beads with Dr. Marlou Sa (08/22/15) . After discharge patient's intra-articular cultures were positive for coag negative staph. Patient presents at this time for irrigation and debridement of the left knee removal polyethylene tray placement of antibiotic beads placement of PICC line was 6 weeks IV antibiotics.  Clinical Impression  Pt admitted with above diagnosis. Pt currently with functional limitations due to the deficits listed below (see PT Problem List).  Pt will benefit from skilled PT to increase their independence and safety with mobility to allow discharge to the venue listed below.  Pt limited by pain at the evaluation, but anticipate good progress.  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  Awaiting clarification on WB status, but pt maintained NWB at time of eval.     Follow Up Recommendations Home health PT;Supervision for mobility/OOB    Equipment Recommendations  None recommended by PT    Recommendations for Other Services       Precautions / Restrictions Precautions Required Braces or Orthoses: Knee Immobilizer - Left Knee Immobilizer - Left: On at all times Restrictions Other Position/Activity Restrictions: Awaiting clarification. Pt maintained NWB L LE.      Mobility  Bed Mobility Overal bed mobility: Needs Assistance Bed Mobility: Supine to Sit     Supine to sit: Min assist     General bed mobility comments: MIN A, but required much effort on pt's part to get trunk  upright  Transfers Overall transfer level: Needs assistance Equipment used: Rolling walker (2 wheeled) Transfers: Sit to/from [facility] Sit to Stand: Min assist Stand pivot transfers: Min assist       General transfer comment: cues for hand placement, pt maintaining NWB L LE while awaiting clarification, although he stated he wouldn't have been able to put any weight through it even if was allowed to  Ambulation/Gait             General Gait Details: SPT only with RW  Stairs            Wheelchair Mobility    Modified Rankin (Stroke Patients Only)       Balance Overall balance assessment: Needs assistance   Sitting balance-Leahy Scale: Fair       Standing balance-Leahy Scale: Poor Standing balance comment: requires RW                             Pertinent Vitals/Pain Pain Assessment: 0-10 Pain Score: 8  Pain Location: L knee Pain Descriptors / Indicators: Grimacing;Sore Pain Intervention(s): Limited activity within patient's tolerance;Monitored during session;Repositioned;Ice applied    Home Living Family/patient expects to be discharged to:: Private residence Living Arrangements: Spouse/significant other;Other relatives (caregivers for mother in law and stepson ) Available Help at Discharge: Family Type of Home: House Home Access: Ramped entrance     [place]: One level [place]: [place] - single point      Prior Function Level of Independence: Independent with assistive device(s)         Comments: Amb with cane  since I &D  last week     Hand Dominance        Extremity/Trunk Assessment   Upper Extremity Assessment: Overall WFL for tasks assessed           Lower Extremity Assessment: LLE deficits/detail   LLE Deficits / Details: ankle WFL, wound vac in place     Communication   Communication: No difficulties  Cognition Arousal/Alertness: Awake/alert Behavior During Therapy: WFL for tasks  assessed/performed Overall Cognitive Status: Within Functional Limits for tasks assessed                      General Comments General comments (skin integrity, edema, etc.): wound vac in place    Exercises Total Joint Exercises Ankle Circles/Pumps: AROM;Both;10 reps      Assessment/Plan    PT Assessment Patient needs continued PT services  PT Diagnosis Difficulty walking   PT Problem List Decreased mobility;Decreased activity tolerance;Decreased strength;Decreased balance;Pain;Decreased knowledge of use of DME  PT Treatment Interventions DME instruction;Gait training;Functional mobility training;Therapeutic activities;Therapeutic exercise;Patient/family education   PT Goals (Current goals can be found in the Care Plan section) Acute Rehab PT Goals Patient Stated Goal: to go home and not rehab PT Goal Formulation: With patient Time For Goal Achievement: 09/05/15 Potential to Achieve Goals: Good    Frequency Min 5X/week   Barriers to discharge        Co-evaluation               End of Session Equipment Utilized During Treatment: Gait belt Activity Tolerance: Patient tolerated treatment well Patient left: in chair;with call bell/phone within reach;with family/visitor present Nurse Communication: Mobility status         Time: 0924-1000 PT Time Calculation (min) (ACUTE ONLY): 36 min   Charges:   PT Evaluation $PT Eval Moderate Complexity: 1 Procedure PT Treatments $Therapeutic Activity: 8-22 mins   PT G Codes:        Geralene Afshar LUBECK 08/29/2015, 12:57 PM

## 2015-08-29 NOTE — Progress Notes (Addendum)
Inpatient Diabetes Program Recommendations  AACE/ADA: New Consensus Statement on Inpatient Glycemic Control (2015)  Target Ranges:  Prepandial:   less than 140 mg/dL      Peak postprandial:   less than 180 mg/dL (1-2 hours)      Critically ill patients:  140 - 180 mg/dL   Results for Steven Charles, Steven Charles (MRN [id number]) as of 08/29/2015 16:10  Ref. Range 08/29/2015 12:14  Glucose-Capillary Latest Ref Range: 65-99 mg/dL 130 (H)   Results for Steven Charles, Steven Charles (MRN [id number]) as of 08/29/2015 16:10  Ref. Range 08/28/2015 12:42  Hemoglobin A1C Latest Ref Range: 4.8-5.6 % 6.7 (H)    Admit with: Septic Left Knee  History: Pre-Diabetes  Home DM Meds: None  Current Insulin Orders: Novolog Sensitive Correction Scale/ SSI (0-9 units ) TID AC + HS    -Note Dr. Marily Memos officially diagnosed patient with DM this weekend.  Current A1c= 6.7%.  -Note Novolog SSI started today at 12pm.  May likely be sent home with Rxs for Metformin and CBG meter.  -DM Coordinator not present on campus over the weekend but can be reached by pager for questions regarding glycemic control.  -Will ask RNs to provide Basic DM survival skills education to patient asap with hospital provided educational tools.   MD- Please make sure to give patient a Rx for home blood glucose meter at time of discharge.  Will need actual physical copy of the Rx for the pharmacy to fill.    Can use Order [id number]  Thanks!    --Will follow patient during hospitalization--  Wyn Quaker RN, MSN, CDE Diabetes Coordinator Inpatient Glycemic Control Team Team Pager: [phone] (8a-5p)

## 2015-08-30 LAB — CBC WITH DIFFERENTIAL/PLATELET
Basophils Absolute: 0 10*3/uL (ref 0.0–0.1)
Basophils Relative: 0 %
EOS PCT: 2 %
Eosinophils Absolute: 0.2 10*3/uL (ref 0.0–0.7)
HCT: 26.6 % — ABNORMAL LOW (ref 39.0–52.0)
Hemoglobin: 8.5 g/dL — ABNORMAL LOW (ref 13.0–17.0)
LYMPHS ABS: 1.2 10*3/uL (ref 0.7–4.0)
LYMPHS PCT: 15 %
MCH: 27.2 pg (ref 26.0–34.0)
MCHC: 32 g/dL (ref 30.0–36.0)
MCV: 85 fL (ref 78.0–100.0)
MONO ABS: 0.9 10*3/uL (ref 0.1–1.0)
Monocytes Relative: 11 %
Neutro Abs: 5.9 10*3/uL (ref 1.7–7.7)
Neutrophils Relative %: 72 %
PLATELETS: 158 10*3/uL (ref 150–400)
RBC: 3.13 MIL/uL — ABNORMAL LOW (ref 4.22–5.81)
RDW: 14.1 % (ref 11.5–15.5)
WBC: 8.3 10*3/uL (ref 4.0–10.5)

## 2015-08-30 LAB — BASIC METABOLIC PANEL
Anion gap: 7 (ref 5–15)
BUN: 13 mg/dL (ref 6–20)
CALCIUM: 9.5 mg/dL (ref 8.9–10.3)
CO2: 25 mmol/L (ref 22–32)
Chloride: 101 mmol/L (ref 101–111)
Creatinine, Ser: 1.05 mg/dL (ref 0.61–1.24)
GFR calc Af Amer: >60 mL/min (ref >60)
GLUCOSE: 116 mg/dL — AB (ref 65–99)
POTASSIUM: 3.8 mmol/L (ref 3.5–5.1)
Sodium: 133 mmol/L — ABNORMAL LOW (ref 135–145)

## 2015-08-30 LAB — GLUCOSE, CAPILLARY
GLUCOSE-CAPILLARY: 117 mg/dL — AB (ref 65–99)
Glucose-Capillary: 122 mg/dL — ABNORMAL HIGH (ref 65–99)
Glucose-Capillary: 135 mg/dL — ABNORMAL HIGH (ref 65–99)
Glucose-Capillary: 132 mg/dL — ABNORMAL HIGH (ref 65–99)
Glucose-Capillary: 140 mg/dL — ABNORMAL HIGH (ref 65–99)

## 2015-08-30 NOTE — Progress Notes (Signed)
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 Patient initially underwent irrigation debridement and placement of antibiotic beads with Dr. Marlou Sa (08/22/15) . After discharge patient's intra-articular cultures were positive for coag negative staph. Patient presents at this time for irrigation and debridement of the left knee removal polyethylene tray placement of antibiotic beads placement of PICC line was 6 weeks IV antibiotics.    PT Comments    Spoke with Dr. Sharol Given and states pt is WBAT on L LE. Pt did much better today and was able to ambulate 100' with increasing WB tolerance as gait progressed.  He did struggle with bed mobility and required MIN A. Pt declining short term SNF and wants to return home with HHPT.  Pt thought he had access to a RW and does not, so will need RW for home use.   Follow Up Recommendations  Home health PT;Supervision for mobility/OOB     Equipment Recommendations  Rolling walker with 5" wheels    Recommendations for Other Services       Precautions / Restrictions Precautions Precautions: Other (comment) Precaution Comments: wound vac Required Braces or Orthoses: Knee Immobilizer - Left Knee Immobilizer - Left: On at all times Restrictions Weight Bearing Restrictions: Yes LLE Weight Bearing: Weight bearing as tolerated (per verbal order from Dr. Sharol Given)    Mobility  Bed Mobility Overal bed mobility: Needs Assistance Bed Mobility: Supine to Sit     Supine to sit: Min assist     General bed mobility comments: MIN A with heavy use of rails and much effort required by patient  Transfers Overall transfer level: Needs assistance Equipment used: Rolling walker (2 wheeled) Transfers: Sit to/from Stand Sit to Stand: Min  guard;From elevated surface (bed height elevated to simulate bed at home)         General transfer comment: cues for hand placement  Ambulation/Gait Ambulation/Gait assistance: Min guard Ambulation Distance (Feet): 75 Feet Assistive device: Rolling walker (2 wheeled) Gait Pattern/deviations: Decreased stance time - left;Antalgic;Trunk flexed Gait velocity: decreased   General Gait Details: Flexed posture with slow gait pattern, but able to WB and increased WB as he progressed with gait   Stairs            Wheelchair Mobility    Modified Rankin (Stroke Patients Only)       Balance Overall balance assessment: Needs assistance Sitting-balance support: Feet supported Sitting balance-Leahy Scale: Fair       Standing balance-Leahy Scale: Poor Standing balance comment: requries RW                    Cognition Arousal/Alertness: Awake/alert Behavior During Therapy: WFL for tasks assessed/performed Overall Cognitive Status: Within Functional Limits for tasks assessed                      Exercises      General Comments General comments (skin integrity, edema, etc.): wound vac in place      Pertinent Vitals/Pain Pain Assessment: 0-10 Pain Score: 5  Pain Location: L knee Pain Descriptors / Indicators: Aching;Operative site guarding Pain Intervention(s): Monitored during session;Premedicated before session    Home Living                      Prior Function  PT Goals (current goals can now be found in the care plan section) Acute Rehab PT Goals Patient Stated Goal: to go home and not rehab PT Goal Formulation: With patient Time For Goal Achievement: 09/05/15 Potential to Achieve Goals: Good Progress towards PT goals: Progressing toward goals    Frequency  Min 5X/week    PT Plan Current plan remains appropriate    Co-evaluation             End of Session Equipment Utilized During Treatment: Gait  belt Activity Tolerance: Patient tolerated treatment well Patient left: in chair;with call bell/phone within reach;with family/visitor present     Time: [id number] PT Time Calculation (min) (ACUTE ONLY): 34 min  Charges:  $Gait Training: 8-22 mins $Therapeutic Activity: 8-22 mins                    G Codes:      Steven Charles 08/30/2015, 9:56 AM

## 2015-08-30 NOTE — Progress Notes (Signed)
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 Patient initially underwent irrigation debridement and placement of antibiotic beads with Dr. Marlou Sa (08/22/15) . After discharge patient's intra-articular cultures were positive for coag negative staph. Patient presents at this time for irrigation and debridement of the left knee removal polyethylene tray placement of antibiotic beads placement of PICC line was 6 weeks IV antibiotics.   Clinical Impression   Pt admitted with the above diagnoses and presents with below problem list. Pt will benefit from continued acute OT to address the below listed deficits and maximize independence with BADLs prior to d/c home. PTA pt was mod I with ADLs. Pt is currently min guard to mod A with LB ADLs and functional mobility/transfers. Session details below. Daughter present for evaluation. Pt wants to go home at d/c, daughter is nervous about this as pt's spouse is already caregiver to 2 other family members full-time. Daughter asking, "If he goes home and it doesn't work out can he go to SNF for rehab, then?" Would benefit from SW/CM consult. Would like to see pt move around better prior to going home. Will follow.       Follow Up Recommendations  Home health OT;Supervision/Assistance - 24 hour    Equipment Recommendations  3 in 1 bedside comode    Recommendations for Other Services Other (comment) (Social Work - any advice on services to support spouse as cg)     Precautions / Restrictions Precautions Precautions: Other (comment) Precaution Comments: wound vac Required Braces or Orthoses: Knee Immobilizer - Left Knee Immobilizer - Left: On at all times Restrictions Weight Bearing Restrictions: Yes LLE Weight  Bearing: Weight bearing as tolerated      Mobility Bed Mobility               General bed mobility comments: up in chair  Transfers Overall transfer level: Needs assistance Equipment used: Rolling walker (2 wheeled) Transfers: Sit to/from Stand Sit to Stand: Min assist;Min guard         General transfer comment: min guard from recliner. Min A to/from 3n1 over toilet.     Balance Overall balance assessment: Needs assistance Sitting-balance support: No upper extremity supported;Feet supported Sitting balance-Leahy Scale: Fair     Standing balance support: Bilateral upper extremity supported;During functional activity Standing balance-Leahy Scale: Poor Standing balance comment: needs externall support for balance                            ADL Overall ADL's : Needs assistance/impaired Eating/Feeding: Set up;Sitting   Grooming: Set up;Sitting   Upper Body Bathing: Set up;Sitting   Lower Body Bathing: Moderate assistance;Sit to/from stand   Upper Body Dressing : Set up;Sitting   Lower Body Dressing: Moderate assistance;Sit to/from stand   Toilet Transfer: Minimal assistance;Ambulation;RW (3n1 over toilet) Toilet Transfer Details (indicate cue type and reason): min A to steady and to power up from 3n1 Toileting- Clothing Manipulation and Hygiene: Moderate assistance;Sit to/from stand Toileting - Clothing Manipulation Details (indicate cue type and reason): Pt stood with therapist completing pericare.     Functional mobility during ADLs: Min guard;Rolling walker General ADL Comments: Pt moving slowing and with increased effort. Close min guard for functional mobility. Min A to transfer to 3n1 over toilet. Spoke  at length with pt and daughter regarding home situation. Pt's wife is full time caregiver to 2 family members in the home and per pt spouse has COPD. Pt wants to go home. Daughter is concerned about d/c home.      Vision     Perception      Praxis      Pertinent Vitals/Pain Pain Assessment: Faces Faces Pain Scale: Hurts even more Pain Location: L knee Pain Descriptors / Indicators: Aching;Operative site guarding Pain Intervention(s): Limited activity within patient's tolerance;Monitored during session;Repositioned     Hand Dominance     Extremity/Trunk Assessment Upper Extremity Assessment Upper Extremity Assessment: Overall WFL for tasks assessed   Lower Extremity Assessment Lower Extremity Assessment: Defer to PT evaluation       Communication     Cognition Arousal/Alertness: Awake/alert Behavior During Therapy: WFL for tasks assessed/performed Overall Cognitive Status: Within Functional Limits for tasks assessed                     General Comments       Exercises       Shoulder Instructions      Home Living Family/patient expects to be discharged to:: Private residence Living Arrangements: Spouse/significant other;Other relatives Available Help at Discharge: Family Type of Home: House Home Access: Ramped entrance     Home Layout: One level     Bathroom Shower/Tub: Teacher, early years/pre: Handicapped height     Home Equipment: [place] - single point          Prior Functioning/Environment Level of Independence: Independent with assistive device(s)        Comments: Amb with cane since I &D  last week.     OT Diagnosis: Acute pain   OT Problem List: Impaired balance (sitting and/or standing);Decreased knowledge of use of DME or AE;Decreased knowledge of precautions;Pain   OT Treatment/Interventions: Self-care/ADL training;DME and/or AE instruction;Therapeutic activities;Patient/family education;Balance training    OT Goals(Current goals can be found in the care plan section) Acute Rehab OT Goals Patient Stated Goal: to go home and not rehab OT Goal Formulation: With patient/family Time For Goal Achievement: 09/06/15 Potential to Achieve Goals: Good ADL  Goals Pt Will Perform Lower Body Bathing: with modified independence;sit to/from stand;with adaptive equipment Pt Will Perform Lower Body Dressing: with modified independence;with adaptive equipment;sit to/from stand Pt Will Transfer to Toilet: with modified independence;ambulating (3n1 over toilet) Pt Will Perform Toileting - Clothing Manipulation and hygiene: with modified independence;sitting/lateral leans;sit to/from stand;with adaptive equipment Pt Will Perform Tub/Shower Transfer: ambulating;3 in 1;rolling walker;Tub transfer;with min guard assist Additional ADL Goal #1: Pt will complete bed mobility at mod I level to prepare for OOB ADLs.   OT Frequency: Min 2X/week   Barriers to D/C: Decreased caregiver support  wife is full-time caregiver to 2 other family members in the home.        Co-evaluation              End of Session Equipment Utilized During Treatment: Gait belt;Rolling walker;Left knee immobilizer  Activity Tolerance: Patient tolerated treatment well;Patient limited by pain Patient left: in chair;with call bell/phone within reach;with family/visitor present   Time: 1030-1104 OT Time Calculation (min): 34 min Charges:  OT General Charges $OT Visit: 1 Procedure OT Evaluation $OT Eval Low Complexity: 1 Procedure OT Treatments $Self Care/Home Management : 8-22 mins G-Codes:    Hortencia Pilar 2015/09/28, 1:21 PM

## 2015-08-30 NOTE — Anesthesia Postprocedure Evaluation (Signed)
Anesthesia Post Note  Patient: Steven Charles  Procedure(s) Performed: Procedure(s) (LRB): Poly Exchange Left Knee (Left)  Patient location during evaluation: PACU Anesthesia Type: General Level of consciousness: awake and alert Pain management: pain level controlled Vital Signs Assessment: post-procedure vital signs reviewed and stable Respiratory status: spontaneous breathing, nonlabored ventilation, respiratory function stable and patient connected to nasal cannula oxygen Cardiovascular status: blood pressure returned to baseline and stable Postop Assessment: no signs of nausea or vomiting Anesthetic complications: no    Last Vitals:  Filed Vitals:   08/29/15 2130 08/30/15 0511  BP: 136/55 133/55  Pulse: 69 74  Temp: 37.1 C 36.8 C  Resp: 16 15    Last Pain:  Filed Vitals:   08/30/15 0513  PainSc: Asleep                 Catalina Gravel

## 2015-08-30 NOTE — Progress Notes (Signed)
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 Patient initially underwent irrigation debridement and placement of antibiotic beads with Dr. Marlou Sa (08/22/15) . After discharge patient's intra-articular cultures were positive for coag negative staph. Patient presents at this time for irrigation and debridement of the left knee removal polyethylene tray placement of antibiotic beads placement of PICC line was 6 weeks IV antibiotics.    PT Comments    Pt continues to struggle with bed mobility and required MOD A with it this session.  Once up, he was able to ambulate 170' with RW and min/guard.  Wife present and A with bed mobility and think it will be difficult for her at home.  They have a recliner he can sleep in and discussed using this to sleep in and have HHPT continue to work on bed mobility with patient.  Wife and pt think this is a good option.  Pt will need a RW at home. (not initially recommended as he thought he had access to one).  Follow Up Recommendations  Home health PT;Supervision for mobility/OOB     Equipment Recommendations  Rolling walker with 5" wheels    Recommendations for Other Services       Precautions / Restrictions Precautions Precautions: Other (comment) Precaution Comments: wound vac Required Braces or Orthoses: Knee Immobilizer - Left Knee Immobilizer - Left: On at all times Restrictions Weight Bearing Restrictions: Yes LLE Weight Bearing: Weight bearing as tolerated    Mobility  Bed Mobility Overal bed mobility: Needs Assistance Bed Mobility: Supine to Sit;Sit to Supine     Supine to sit: Mod assist Sit to supine: Min assist   General bed mobility comments: Wife present and A with supine > sit transfer. Moves slowly  and has a lot of difficulty, still reliant on rails.  A for LE with sit >supine from PT.  Transfers Overall transfer level: Needs assistance Equipment used: Rolling walker (2 wheeled) Transfers: Sit to/from Stand Sit to Stand: Min assist         General transfer comment: cues for hand placement and MIN A to power up with slow transition  Ambulation/Gait Ambulation/Gait assistance: Min guard Ambulation Distance (Feet): 170 Feet Assistive device: Rolling walker (2 wheeled) Gait Pattern/deviations: Trunk flexed;Decreased step length - left;Decreased step length - right Gait velocity: decreased Gait velocity interpretation: Below normal speed for age/gender General Gait Details: Flexd posture and needs frequent cues to look up.  Will correct, then returnes to the posture.   Stairs            Wheelchair Mobility    Modified Rankin (Stroke Patients Only)       Balance Overall balance assessment: Needs assistance Sitting-balance support: Feet supported Sitting balance-Leahy Scale: Fair     Standing balance support: Bilateral upper extremity supported Standing balance-Leahy Scale: Poor Standing balance comment: needs UE support                    Cognition Arousal/Alertness: Awake/alert Behavior During Therapy: WFL for tasks assessed/performed Overall Cognitive Status: Within Functional Limits for tasks assessed                      Exercises      General Comments General comments (skin integrity, edema, etc.): wife states she is going  to have him sleep on her side of the bed and states it will be easier      Pertinent Vitals/Pain Pain Assessment: 0-10 Pain Score: 8  Faces Pain Scale: Hurts even more Pain Location: L knee Pain Descriptors / Indicators: Aching;Heaviness;Sore Pain Intervention(s): Limited activity within patient's tolerance;Monitored during session;Premedicated before session;Repositioned    Home Living Family/patient expects  to be discharged to:: Private residence Living Arrangements: Spouse/significant other;Other relatives Available Help at Discharge: Family Type of Home: House Home Access: Ramped entrance   Home Layout: One level Home Equipment: Cane - single point      Prior Function Level of Independence: Independent with assistive device(s)      Comments: Amb with cane since I &D  last week.    PT Goals (current goals can now be found in the care plan section) Acute Rehab PT Goals Patient Stated Goal: to go home and not rehab PT Goal Formulation: With patient Time For Goal Achievement: 09/05/15 Potential to Achieve Goals: Good Progress towards PT goals: Progressing toward goals    Frequency  Min 5X/week    PT Plan Current plan remains appropriate    Co-evaluation             End of Session Equipment Utilized During Treatment: Gait belt;Left knee immobilizer;Other (comment) (wound vac) Activity Tolerance: Patient tolerated treatment well Patient left: in bed;with call bell/phone within reach;with family/visitor present     Time: [id number] PT Time Calculation (min) (ACUTE ONLY): 41 min  Charges:  $Gait Training: 8-22 mins $Therapeutic Activity: 23-37 mins                    G Codes:      Lakeyn Dokken LUBECK 08/30/2015, 4:03 PM

## 2015-08-30 NOTE — Progress Notes (Signed)
Hospitalist consult follow-up:  70 year old admitted to orthopedic service for septic knee with infectious disease consulted for management and antibiotics.  Patient with previous history of pretty-diabetes, no formal diagnosis of diabetes and has not been on any medications. During his hospitalization, A1c at 6.7 the patient had significant hyperglycemia, likely in the setting of acute illness/infection.   Hospitalist consulted. Today, CBGs have been quite stable. Currently on sliding scale and CBG under 150. Upon discharge, patient will likely benefit from low-dose metformin 500 twice a day. Continue to monitor closely.

## 2015-08-30 NOTE — Progress Notes (Signed)
Patient ID: Steven Charles, male   DOB: 02-23-1946, 70 y.o.   MRN: [id number] Status post irrigation debridement for septic left knee. Patient is slowly mobilizing with therapy possible discharge to home on Monday he is on IV antibiotics with a PICC line. Thank you for medicine consultation for patient's worsening glycemic control.

## 2015-08-30 NOTE — Progress Notes (Signed)
Steven Charles form Lab called with a critical lab result - collected on 08/29/15 Dr. Sharol Given was notified, no new orders.  RARE GRAM POSITIVE COCCI  CULTURE REINCUBATED FOR BETTER GROWTH        [name] n 08/30/2015 1:41 PM

## 2015-08-31 ENCOUNTER — Other Ambulatory Visit: Payer: Self-pay | Admitting: *Deleted

## 2015-08-31 ENCOUNTER — Encounter (HOSPITAL_COMMUNITY): Payer: Self-pay | Admitting: Orthopedic Surgery

## 2015-08-31 DIAGNOSIS — T8454XA Infection and inflammatory reaction due to internal left knee prosthesis, initial encounter: Secondary | ICD-10-CM | POA: Diagnosis not present

## 2015-08-31 DIAGNOSIS — Z96659 Presence of unspecified artificial knee joint: Secondary | ICD-10-CM | POA: Diagnosis not present

## 2015-08-31 DIAGNOSIS — E1100 Type 2 diabetes mellitus with hyperosmolarity without nonketotic hyperglycemic-hyperosmolar coma (NKHHC): Secondary | ICD-10-CM

## 2015-08-31 DIAGNOSIS — E871 Hypo-osmolality and hyponatremia: Secondary | ICD-10-CM

## 2015-08-31 LAB — GLUCOSE, CAPILLARY
GLUCOSE-CAPILLARY: 144 mg/dL — AB (ref 65–99)
Glucose-Capillary: 121 mg/dL — ABNORMAL HIGH (ref 65–99)

## 2015-08-31 LAB — BASIC METABOLIC PANEL
ANION GAP: 7 (ref 5–15)
BUN: 11 mg/dL (ref 6–20)
CO2: 26 mmol/L (ref 22–32)
Calcium: 9.7 mg/dL (ref 8.9–10.3)
Chloride: 102 mmol/L (ref 101–111)
Creatinine, Ser: 1.01 mg/dL (ref 0.61–1.24)
GFR calc non Af Amer: >60 mL/min (ref >60)
GLUCOSE: 123 mg/dL — AB (ref 65–99)
Potassium: 3.9 mmol/L (ref 3.5–5.1)
SODIUM: 135 mmol/L (ref 135–145)

## 2015-08-31 LAB — CBC WITH DIFFERENTIAL/PLATELET
BASOS ABS: 0 10*3/uL (ref 0.0–0.1)
BASOS PCT: 0 %
EOS ABS: 0.3 10*3/uL (ref 0.0–0.7)
Eosinophils Relative: 3 %
HEMATOCRIT: 25.4 % — AB (ref 39.0–52.0)
Hemoglobin: 8.1 g/dL — ABNORMAL LOW (ref 13.0–17.0)
Lymphocytes Relative: 15 %
Lymphs Abs: 1.3 10*3/uL (ref 0.7–4.0)
MCH: 27.2 pg (ref 26.0–34.0)
MCHC: 31.9 g/dL (ref 30.0–36.0)
MCV: 85.2 fL (ref 78.0–100.0)
MONO ABS: 0.9 10*3/uL (ref 0.1–1.0)
Monocytes Relative: 10 %
NEUTROS ABS: 6.3 10*3/uL (ref 1.7–7.7)
NEUTROS PCT: 72 %
Platelets: 167 10*3/uL (ref 150–400)
RBC: 2.98 MIL/uL — ABNORMAL LOW (ref 4.22–5.81)
RDW: 14.3 % (ref 11.5–15.5)
WBC: 8.7 10*3/uL (ref 4.0–10.5)

## 2015-08-31 LAB — HEMOGLOBIN A1C
Hgb A1c MFr Bld: 6.5 % — ABNORMAL HIGH (ref 4.8–5.6)
Mean Plasma Glucose: 140 mg/dL

## 2015-08-31 MED ORDER — HEPARIN SOD (PORK) LOCK FLUSH 100 UNIT/ML IV SOLN
250.0000 [IU] | Freq: Every day | INTRAVENOUS | Status: DC
  Filled 2015-08-31: qty 2.5

## 2015-08-31 MED ORDER — HEPARIN SOD (PORK) LOCK FLUSH 100 UNIT/ML IV SOLN
250.0000 [IU] | INTRAVENOUS | Status: DC | PRN
  Administered 2015-08-31: 250 [IU]
  Filled 2015-08-31 (×2): qty 3

## 2015-08-31 MED ORDER — HEPARIN SOD (PORK) LOCK FLUSH 100 UNIT/ML IV SOLN
250.0000 [IU] | INTRAVENOUS | Status: AC | PRN
  Administered 2015-08-31: 250 [IU]

## 2015-08-31 MED ORDER — OXYCODONE-ACETAMINOPHEN 5-325 MG PO TABS: 1.0000 | ORAL_TABLET | Freq: Four times a day (QID) | ORAL | Status: DC | PRN

## 2015-08-31 MED ORDER — METFORMIN HCL 500 MG PO TABS: 500.0000 mg | ORAL_TABLET | Freq: Two times a day (BID) | ORAL | Status: DC

## 2015-08-31 MED ORDER — CEFAZOLIN SODIUM-DEXTROSE 2-4 GM/100ML-% IV SOLN: 2.0000 g | Freq: Three times a day (TID) | INTRAVENOUS | Status: DC

## 2015-08-31 NOTE — Progress Notes (Signed)
Physical Therapy Treatment Patient Details Name: Steven Charles MRN: [id number] DOB: 1945-10-23 Today's Date: 08/31/2015    History of Present Illness Patient is a 70 year old gentleman who is several years status post total knee arthroplasty developed a prepatellar bursal infection. Patient initially underwent irrigation debridement and placement of antibiotic beads with Dr. Marlou Sa (08/22/15) . After discharge patient's intra-articular cultures were positive for coag negative staph. Patient presents at this time for irrigation and debridement of the left knee removal polyethylene tray placement of antibiotic beads placement of PICC line was 6 weeks IV antibiotics.    PT Comments    Pt making good progress with ambulation and sit <> stand transfers. Discussed use of recliner vs bed for sleeping again. Reports a family friend is coming to help him get out of car into the house.  Con't to recommend HHPT and RW.    Follow Up Recommendations  Home health PT;Supervision for mobility/OOB     Equipment Recommendations  Rolling walker with 5" wheels    Recommendations for Other Services       Precautions / Restrictions Precautions Precautions: Other (comment) Precaution Comments: wound vac Required Braces or Orthoses: Knee Immobilizer - Left Knee Immobilizer - Left: On at all times Restrictions Weight Bearing Restrictions: Yes LLE Weight Bearing: Weight bearing as tolerated    Mobility  Bed Mobility Overal bed mobility: Needs Assistance Bed Mobility: Supine to Sit     Supine to sit: Mod assist     General bed mobility comments: pt up in recliner upon arrival  Transfers Overall transfer level: Needs assistance Equipment used: Rolling walker (2 wheeled) Transfers: Sit to/from Stand Sit to Stand: Supervision Stand pivot transfers: Min guard       General transfer comment: stood with S and no physical A or touching for steadying  Ambulation/Gait Ambulation/Gait assistance: Min  guard;Supervision Ambulation Distance (Feet): 210 Feet Assistive device: Rolling walker (2 wheeled) Gait Pattern/deviations: Step-through pattern;Decreased step length - right;Decreased step length - left;Trunk flexed Gait velocity: decreased Gait velocity interpretation: Below normal speed for age/gender General Gait Details: Flexed posture, but improved from previous sessions.    Stairs            Wheelchair Mobility    Modified Rankin (Stroke Patients Only)       Balance     Sitting balance-Leahy Scale: Fair       Standing balance-Leahy Scale: Fair Standing balance comment: Pt was able to stand without UE support staticly.  Needs UE support for dynamic activities.                    Cognition Arousal/Alertness: Awake/alert Behavior During Therapy: WFL for tasks assessed/performed Overall Cognitive Status: Within Functional Limits for tasks assessed                      Exercises Total Joint Exercises Ankle Circles/Pumps: AROM;Both;10 reps Quad Sets: 5 reps;Left;Supine    General Comments General comments (skin integrity, edema, etc.): Pt seems apprehensive about going home, but then when asked if he wants to go to rehab says no.  He states he is nervous about if he goes home and it doesn't go well with wound vac and bed mobility.  Discussed again the role of HHPT and use of recliner.  Spoke to case manager and she is going to have [place] check on pt.      Pertinent Vitals/Pain Pain Assessment: No/denies pain    Home Living  Prior Function            PT Goals (current goals can now be found in the care plan section) Acute Rehab PT Goals Patient Stated Goal: to go home and not rehab PT Goal Formulation: With patient Time For Goal Achievement: 09/05/15 Potential to Achieve Goals: Good Progress towards PT goals: Progressing toward goals    Frequency  Min 5X/week    PT Plan Current plan remains appropriate     Co-evaluation             End of Session Equipment Utilized During Treatment: Gait belt;Left knee immobilizer;Other (comment) (wound vac) Activity Tolerance: Patient tolerated treatment well Patient left: in chair;with call bell/phone within reach;Other (comment) (with lunch)     Time: [id number] PT Time Calculation (min) (ACUTE ONLY): 25 min  Charges:  $Gait Training: 23-37 mins                    G Codes:      Smayan Hackbart LUBECK 08/31/2015, 1:01 PM

## 2015-08-31 NOTE — Progress Notes (Addendum)
[facility] PROGRESS NOTE  KAMAR STEINBACHER [id number] DOB: 11-14-1945 DOA: 08/28/2015   PCP: Cathlean Cower, MD     Assessment/Plan: Active Problems:   Failed total knee arthroplasty ([place])   Hyponatremia   Diabetes type 2, uncontrolled ([place])   AKI (acute kidney injury) ([place])   70 y.o. male who presented on 08/28/2015 for irrigation and debridement of left knee. Patient has a history of total knee replacement and that joint. Surgery was undertaken in attempt to salvage the knee. ID was consulted to assist with treatment plan and antibiotic regimen. Plan at this time is 6 weeks of IV antibiotics. Patient underwent operative management of his knee with placement of wound VAC on 08/28/2015. Prior to admission patient has had generalized ill feeling but otherwise in his normal state of health.  History of prediabetes for several years though he states he has never been offered nor on medications to treat his elevated sugars. Patient does not check sugars at home. Denies any polyuria or polydipsia.  Assessment and plan patellar bursal infection with underlying failed total knee arthroplasty: Patient is now POD#3 after washout and debridement by Dr. Sharol Given. Infectious disease team managing antibiotics, patient will need 6 weeks of IV antibiotics. Antibiotics through July 13,Likely follow up oral antibiotic such as Keflex for 3-6 months following IV therapy  Diabetes:   A1c now being 6.7.  - SSI  CBG stable Add metformin to AVS and notify Dr Sharol Given     Pseudohyponatremia: 124>135  on the day of discharge.  Likely pseudohyponatremia due to hyperglycemia on admission    AKI: Improved 1.56.>1.01, Baseline 1.2. Suspect mild elevation due to systemic infection, hyperglycemia and stresses from surgery.      DVT prophylaxsis per ortho   Code Status:  Full code    Family Communication: Discussed in detail with the patient, all imaging results, lab results explained to the patient    Disposition Plan:  Stable for discharge from a medical standpoint     Consultants:  Infectious disease  Orthopedics  Procedures:  None  Antibiotics: Anti-infectives    Start     Dose/Rate Route Frequency Ordered Stop   08/31/15 0000  ceFAZolin (ANCEF) 2-4 GM/100ML-% IVPB     2 g 200 mL/hr over 30 Minutes Intravenous Every 8 hours 08/31/15 0656     08/28/15 2100  ceFAZolin (ANCEF) IVPB 2g/100 mL premix     2 g 200 mL/hr over 30 Minutes Intravenous Every 8 hours 08/28/15 1903     08/28/15 1745  clindamycin (CLEOCIN) IVPB 600 mg     600 mg 100 mL/hr over 30 Minutes Intravenous Every 6 hours 08/28/15 1736 08/29/15 0220   08/28/15 1323  gentamicin (GARAMYCIN) injection  Status:  Discontinued       As needed 08/28/15 1323 08/28/15 1430   08/28/15 1323  vancomycin (VANCOCIN) powder  Status:  Discontinued       As needed 08/28/15 1325 08/28/15 1430   08/28/15 1321  gentamicin (GARAMYCIN) 80 mg in sodium chloride irrigation 0.9 % 500 mL irrigation  Status:  Discontinued       As needed 08/28/15 1322 08/28/15 1430   08/28/15 1230  ceFAZolin (ANCEF) IVPB 2g/100 mL premix  Status:  Discontinued     2 g 200 mL/hr over 30 Minutes Intravenous To [facility] 08/27/15 1218 08/27/15 1724   08/28/15 1230  ceFAZolin (ANCEF) 3 g in dextrose 5 % 50 mL IVPB     3 g 130  mL/hr over 30 Minutes Intravenous To [facility] 08/27/15 1724 08/28/15 1308         HPI/Subjective: Hemodynamically stable overnight  Objective: Filed Vitals:   08/29/15 2130 08/30/15 0511 08/30/15 1924 08/31/15 0315  BP: 136/55 133/55 110/54 108/47  Pulse: 69 74 68 67  Temp: 98.7 F (37.1 C) 98.3 F (36.8 C) 98.1 F (36.7 C) 98.1 F (36.7 C)  TempSrc: Oral Oral Oral Oral  Resp: 16 15 14 16   Height:      Weight:      SpO2: 95% 93% 92% 94%    Intake/Output Summary (Last 24 hours) at 08/31/15 [id number] Last data filed at 08/31/15 [id number]  Gross per 24 hour  Intake    360 ml  Output    400 ml  Net     -40 ml    Exam:  Examination:  General exam: Appears calm and comfortable  Respiratory system: Clear to auscultation. Respiratory effort normal. Cardiovascular system: S1 & S2 heard, RRR. No JVD, murmurs, rubs, gallops or clicks. No pedal edema. Gastrointestinal system: Abdomen is nondistended, soft and nontender. No organomegaly or masses felt. Normal bowel sounds heard. Central nervous system: Alert and oriented. No focal neurological deficits. Extremities: Symmetric 5 x 5 power. Skin: No rashes, lesions or ulcers Psychiatry: Judgement and insight appear normal. Mood & affect appropriate.     Data Reviewed: I have personally reviewed following labs and imaging studies  Micro Results Recent Results (from the past 240 hour(s))  Aerobic Culture (superficial specimen) (NOT AT [facility])     Status: None   Collection Time: 08/22/15  1:46 PM  Result Value Ref Range Status   Specimen Description KNEE LEFT  Final   Special Requests Immunocompromised  Final   Gram Stain   Final    FEW WBC PRESENT, PREDOMINANTLY PMN NO ORGANISMS SEEN CONFIRMED BY VINCE W    Culture   Final    FEW STAPHYLOCOCCUS LUGDUNENSIS CRITICAL RESULT CALLED TO, READ BACK BY AND VERIFIED WITH: T COUNCIL,RN AT 1449 08/23/15 BY L BENFIELD Performed at [facility]    Report Status 08/25/2015 FINAL  Final   Organism ID, Bacteria STAPHYLOCOCCUS LUGDUNENSIS  Final      Susceptibility   Staphylococcus lugdunensis - MIC*    CIPROFLOXACIN <=0.5 SENSITIVE Sensitive     ERYTHROMYCIN >=8 RESISTANT Resistant     GENTAMICIN <=0.5 SENSITIVE Sensitive     OXACILLIN 2 SENSITIVE Sensitive     TETRACYCLINE <=1 SENSITIVE Sensitive     VANCOMYCIN <=0.5 SENSITIVE Sensitive     TRIMETH/SULFA <=10 SENSITIVE Sensitive     CLINDAMYCIN <=0.25 RESISTANT Resistant     RIFAMPIN <=0.5 SENSITIVE Sensitive     Inducible Clindamycin POSITIVE Resistant     * FEW STAPHYLOCOCCUS LUGDUNENSIS  Anaerobic culture     Status: None    Collection Time: 08/22/15  6:50 PM  Result Value Ref Range Status   Specimen Description SYNOVIAL PREPATELLA BURSA FLUID LEFT KNEE  Final   Special Requests PATIENT ON FOLLOWING ROCEPHIN AND VANCOMYCIN  Final   Culture   Final    NO ANAEROBES ISOLATED Performed at [facility]    Report Status 08/26/2015 FINAL  Final  Body fluid culture     Status: None   Collection Time: 08/22/15  6:50 PM  Result Value Ref Range Status   Specimen Description SYNOVIAL PREPATELLLA BURSA FLUID LEFT KNEE  Final   Special Requests PATIENT ON FOLLOWING ROCEPHIN AND VANCOMYCIN  Final   Gram Stain  Final    MODERATE WBC PRESENT, PREDOMINANTLY PMN NO ORGANISMS SEEN    Culture   Final    RARE STAPHYLOCOCCUS LUGDUNENSIS CRITICAL RESULT CALLED TO, READ BACK BY AND VERIFIED WITH: S. PEELE R.N. AT [id number] 08/25/15 S. [place] Performed at [facility]    Report Status 08/26/2015 FINAL  Final   Organism ID, Bacteria STAPHYLOCOCCUS LUGDUNENSIS  Final      Susceptibility   Staphylococcus lugdunensis - MIC*    CIPROFLOXACIN <=0.5 SENSITIVE Sensitive     ERYTHROMYCIN >=8 RESISTANT Resistant     GENTAMICIN <=0.5 SENSITIVE Sensitive     OXACILLIN 2 SENSITIVE Sensitive     TETRACYCLINE <=1 SENSITIVE Sensitive     VANCOMYCIN <=0.5 SENSITIVE Sensitive     TRIMETH/SULFA <=10 SENSITIVE Sensitive     CLINDAMYCIN <=0.25 RESISTANT Resistant     RIFAMPIN <=0.5 SENSITIVE Sensitive     Inducible Clindamycin POSITIVE Resistant     * RARE STAPHYLOCOCCUS LUGDUNENSIS  Aerobic/Anaerobic Culture(surg specimen) (NOT AT [facility])     Status: None   Collection Time: 08/22/15  6:50 PM  Result Value Ref Range Status   Specimen Description ABSCESS  Final   Special Requests   Final    LEFT KNEE JOINT INTRA ARTICULAR NO 1 PATIENT ON FOLLOWING ROCEPHIN,VANCOMYCIN   Gram Stain RARE WBC SEEN NO ORGANISMS SEEN   Final   Culture   Final    RARE STAPHYLOCOCCUS LUGDUNENSIS CRITICAL RESULT CALLED TO, READ BACK BY AND VERIFIED  WITH: S. PEELE R.N. AT [id number] 08/25/15 S. YARBROUGH NO ANAEROBES ISOLATED Performed at [facility]    Report Status 08/28/2015 FINAL  Final   Organism ID, Bacteria STAPHYLOCOCCUS LUGDUNENSIS  Final      Susceptibility   Staphylococcus lugdunensis - MIC*    CIPROFLOXACIN <=0.5 SENSITIVE Sensitive     ERYTHROMYCIN >=8 RESISTANT Resistant     GENTAMICIN <=0.5 SENSITIVE Sensitive     OXACILLIN 2 SENSITIVE Sensitive     TETRACYCLINE <=1 SENSITIVE Sensitive     VANCOMYCIN <=0.5 SENSITIVE Sensitive     TRIMETH/SULFA <=10 SENSITIVE Sensitive     CLINDAMYCIN <=0.25 RESISTANT Resistant     RIFAMPIN <=0.5 SENSITIVE Sensitive     Inducible Clindamycin POSITIVE Resistant     * RARE STAPHYLOCOCCUS LUGDUNENSIS  Body fluid culture     Status: None (Preliminary result)   Collection Time: 08/28/15  1:41 PM  Result Value Ref Range Status   Specimen Description FLUID LEFT KNEE  Final   Special Requests NONE  Final   Gram Stain   Final    FEW WBC PRESENT,BOTH PMN AND MONONUCLEAR NO ORGANISMS SEEN    Culture   Final    RARE GRAM POSITIVE COCCI CULTURE REINCUBATED FOR BETTER GROWTH CRITICAL RESULT CALLED TO, READ BACK BY AND VERIFIED WITH: F FRANCIS,RN AT 1220 08/30/15 BY L BENFIELD    Report Status PENDING  Incomplete    Radiology Reports Ir US Guide Vasc Access Right  08/28/2015  INDICATION: 70 year old male with a history of knee infection. EXAM: Right PICC LINE PLACEMENT WITH ULTRASOUND AND FLUOROSCOPIC GUIDANCE MEDICATIONS: None ANESTHESIA/SEDATION: No sedation FLUOROSCOPY TIME:  Fluoroscopy Time: 0 minutes dwell seconds (2.8 mGy). COMPLICATIONS: None PROCEDURE: The patient was advised of the possible risks and complications and agreed to undergo the procedure. The patient was then brought to the angiographic suite for the procedure. The right arm was prepped with chlorhexidine, draped in the usual sterile fashion using maximum barrier technique (cap and mask, sterile gown, sterile gloves,  large sterile sheet, hand hygiene and cutaneous antisepsis) and infiltrated locally with 1% Lidocaine. Ultrasound demonstrated patency of the right basilic vein, and this was documented with an image. Under real-time ultrasound guidance, this vein was accessed with a 21 gauge micropuncture needle and image documentation was performed. A 0.018 wire was introduced in to the vein. Over this, a 5 [place] dual lumen power injectable PICC was advanced to the lower SVC/right atrial junction. Fluoroscopy during the procedure and fluoro spot radiograph confirms appropriate catheter position. The catheter was flushed and covered with asterile dressing. Catheter length: 43 IMPRESSION: Status post right arm basilic vein power injectable dual-lumen PICC. Catheter ready for use. Signed, Dulcy Fanny. Earleen Newport, DO [facility] Electronically Signed   By: Corrie Mckusick D.O.   On: 08/28/2015 18:18   Dg Knee Complete 4 Views Left  08/22/2015  CLINICAL DATA:  Left knee pain, swelling, drainage EXAM: LEFT KNEE - COMPLETE 4+ VIEW COMPARISON:  None. FINDINGS: Left total knee arthroplasty. Two K-wires and cerclage wire transfixing a healed patellar fracture. One of the 2 K-wires are fractured. Old healed fracture of the distal left femoral diametaphysis. Posttraumatic deformity of the left proximal fibula. No acute fracture or dislocation. Diffuse soft tissue swelling around the left knee. IMPRESSION: 1. Left total knee arthroplasty. 2. Diffuse soft tissue swelling around the left knee. Electronically Signed   By: Kathreen Devoid   On: 08/22/2015 14:09   Ir Fluoro Guide Cv Midline Picc Right  08/28/2015  INDICATION: 70 year old male with a history of knee infection. EXAM: Right PICC LINE PLACEMENT WITH ULTRASOUND AND FLUOROSCOPIC GUIDANCE MEDICATIONS: None ANESTHESIA/SEDATION: No sedation FLUOROSCOPY TIME:  Fluoroscopy Time: 0 minutes dwell seconds (2.8 mGy). COMPLICATIONS: None  PROCEDURE: The patient was advised of the possible risks and complications and agreed to undergo the procedure. The patient was then brought to the angiographic suite for the procedure. The right arm was prepped with chlorhexidine, draped in the usual sterile fashion using maximum barrier technique (cap and mask, sterile gown, sterile gloves, large sterile sheet, hand hygiene and cutaneous antisepsis) and infiltrated locally with 1% Lidocaine. Ultrasound demonstrated patency of the right basilic vein, and this was documented with an image. Under real-time ultrasound guidance, this vein was accessed with a 21 gauge micropuncture needle and image documentation was performed. A 0.018 wire was introduced in to the vein. Over this, a 5 [place] dual lumen power injectable PICC was advanced to the lower SVC/right atrial junction. Fluoroscopy during the procedure and fluoro spot radiograph confirms appropriate catheter position. The catheter was flushed and covered with asterile dressing. Catheter length: 43 IMPRESSION: Status post right arm basilic vein power injectable dual-lumen PICC. Catheter ready for use. Signed, Dulcy Fanny. Earleen Newport, DO [facility] Electronically Signed   By: Corrie Mckusick D.O.   On: 08/28/2015 18:18     CBC  Recent Labs Lab 08/28/15 1420 08/29/15 0645 08/30/15 0644 08/31/15 0632  WBC  --  7.7 8.3 8.7  HGB 9.2* 8.2* 8.5* 8.1*  HCT 27.0* 25.5* 26.6* 25.4*  PLT  --  171 158 167  MCV  --  85.6 85.0 85.2  MCH  --  27.5 27.2 27.2  MCHC  --  32.2 32.0 31.9  RDW  --  14.1 14.1 14.3  LYMPHSABS  --  1.3 1.2 1.3  MONOABS  --  0.6 0.9 0.9  EOSABS  --  0.1 0.2 0.3  BASOSABS  --  0.0 0.0 0.0  Chemistries   Recent Labs Lab 08/28/15 1420 08/29/15 0645 08/30/15 0644 08/31/15 0632  NA 139 124* 133* 135  K 4.4 3.5 3.8 3.9  CL  --  89* 101 102  CO2  --  24 25 26   GLUCOSE 133* 457* 116* 123*  BUN  --  17 13 11   CREATININE  --   1.56* 1.05 1.01  CALCIUM  --  8.1* 9.5 9.7   ------------------------------------------------------------------------------------------------------------------ estimated creatinine clearance is 88.1 mL/min (by C-G formula based on Cr of 1.01). ------------------------------------------------------------------------------------------------------------------  Recent Labs  08/28/15 1242  HGBA1C 6.7*   ------------------------------------------------------------------------------------------------------------------ No results for input(s): CHOL, HDL, LDLCALC, TRIG, CHOLHDL, LDLDIRECT in the last 72 hours. ------------------------------------------------------------------------------------------------------------------ No results for input(s): TSH, T4TOTAL, T3FREE, THYROIDAB in the last 72 hours.  Invalid input(s): FREET3 ------------------------------------------------------------------------------------------------------------------ No results for input(s): VITAMINB12, FOLATE, FERRITIN, TIBC, IRON, RETICCTPCT in the last 72 hours.  Coagulation profile No results for input(s): INR, PROTIME in the last 168 hours.  No results for input(s): DDIMER in the last 72 hours.  Cardiac Enzymes No results for input(s): CKMB, TROPONINI, MYOGLOBIN in the last 168 hours.  Invalid input(s): CK ------------------------------------------------------------------------------------------------------------------ Invalid input(s): POCBNP   CBG:  Recent Labs Lab 08/30/15 0907 08/30/15 1128 08/30/15 1626 08/30/15 2249 08/31/15 0654  GLUCAP 135* 132* 140* 117* 121*       Studies: No results found.    Lab Results  Component Value Date   HGBA1C 6.7* 08/28/2015   HGBA1C 6.4 02/17/2015   HGBA1C 5.9 08/14/2014   Lab Results  Component Value Date   MICROALBUR 0.3 12/12/2006   LDLCALC 82 02/17/2015   CREATININE 1.01 08/31/2015       Scheduled Meds: . alfuzosin  10 mg Oral Q breakfast   . aspirin EC  325 mg Oral Q breakfast  . atorvastatin  40 mg Oral Daily  . carvedilol  12.5 mg Oral BID WC  .  ceFAZolin (ANCEF) IV  2 g Intravenous Q8H  . docusate sodium  100 mg Oral BID  . finasteride  5 mg Oral Daily  . FLUoxetine  40 mg Oral Daily  . gabapentin  300 mg Oral BID  . insulin aspart  0-5 Units Subcutaneous QHS  . insulin aspart  0-9 Units Subcutaneous TID WC  . lisinopril  20 mg Oral Daily  . pantoprazole  40 mg Oral BID  . tamsulosin  0.4 mg Oral BID   Continuous Infusions:    LOS: 3 days    Time spent: >30 MINS    [facility]  [facility] Pager [phone]. If 7PM-7AM, please contact night-coverage at [email or web address], password [facility] 08/31/2015, 9:21 AM  LOS: 3 days

## 2015-08-31 NOTE — Plan of Care (Signed)
Problem: Food- and Nutrition-Related Knowledge Deficit (NB-1.1) Goal: Nutrition education Formal process to instruct or train a patient/client in a skill or to impart knowledge to help patients/clients voluntarily manage or modify food choices and eating behavior to maintain or improve health. Outcome: Completed/Met Date Met:  08/31/15  RD consulted for nutrition education regarding diabetes.     Lab Results  Component Value Date    HGBA1C 6.7* 08/28/2015    RD provided "Carbohydrate Counting for People with Diabetes" handout from the Academy of Nutrition and Dietetics. Discussed different food groups and their effects on blood sugar, emphasizing carbohydrate-containing foods. Provided list of carbohydrates and recommended serving sizes of common foods.  Discussed importance of controlled and consistent carbohydrate intake throughout the day. Provided examples of ways to balance meals/snacks and encouraged intake of high-fiber, whole grain complex carbohydrates. Discussed diabetic friendly drink options.Teach back method used.  Expect good compliance.  Body mass index is 39.89 kg/(m^2). Pt meets criteria for class II obesity based on current BMI.  Current diet order is carbohydrate modified, patient is consuming approximately 75-100% of meals at this time. Labs and medications reviewed. No further nutrition interventions warranted at this time. RD contact information provided. If additional nutrition issues arise, please re-consult RD.  Corrin Parker, MS, RD, LDN Pager # [phone] After hours/ weekend pager # [phone]

## 2015-08-31 NOTE — Progress Notes (Addendum)
Inpatient Diabetes Program Recommendations  AACE/ADA: New Consensus Statement on Inpatient Glycemic Control (2015)  Target Ranges:  Prepandial:   less than 140 mg/dL      Peak postprandial:   less than 180 mg/dL (1-2 hours)      Critically ill patients:  140 - 180 mg/dL   Results for JULIAN, MALE (MRN [id number]) as of 08/31/2015 13:47  Ref. Range 08/31/2015 06:54 08/31/2015 11:50  Glucose-Capillary Latest Ref Range: 65-99 mg/dL 121 (H) 144 (H)   Results for TORYN, BUSHELL (MRN [id number]) as of 08/31/2015 13:47  Ref. Range 08/29/2015 09:22  Hemoglobin A1C Latest Ref Range: 4.8-5.6 % 6.5 (H)    Admit with: Septic Left Knee  History: Pre-Diabetes  Home DM Meds: None  Current Insulin Orders: Novolog Sensitive Correction Scale/ SSI (0-9 units ) TID AC + HS     Spoke with pt and wife about new diagnosis.  Discussed A1C results with them and explained what an A1C is, basic pathophysiology of DM Type 2, basic home care, basic diabetes diet nutrition principles, importance of checking CBGs and maintaining good CBG control to prevent long-term and short-term complications.  Reviewed signs and symptoms of hyperglycemia and hypoglycemia and how to treat hypoglycemia at home.  Also reviewed blood sugar goals at home.  Discussed with patient that we will be sending him home on Metformin and discussed what Metformin is, how to take, when to take, side effects, etc.  RNs to provide ongoing basic DM education at bedside with this patient.  Have ordered educational booklet and DM videos.  RD visited with patient this AM to review DM diet at home.  Also placed order for Outpatient DM Education order for patient to follow-up at the [facility].  Outpatient Center to contact patient for appointment.     --Will follow patient during hospitalization--  Wyn Quaker RN, MSN, CDE Diabetes Coordinator Inpatient Glycemic Control Team Team Pager: [phone]  (8a-5p)

## 2015-08-31 NOTE — Discharge Summary (Signed)
Physician Discharge Summary  Patient ID: Steven Charles MRN: [id number] DOB/AGE: 1945/05/09 70 y.o.  Admit date: 08/28/2015 Discharge date: 08/31/2015  Admission Diagnoses:Infected total knee arthroplasty left  Discharge Diagnoses:  Active Problems:   Failed total knee arthroplasty (HCC)   Hyponatremia   Diabetes type 2, uncontrolled ([place])   AKI (acute kidney injury) [facility])   Discharged Condition: stable  Hospital Course: Patient's hospital course was essentially unremarkable. He underwent irrigation and debridement of total knee arthroplasty revision of the tibial component placement of antibiotic beads placement of a wound VAC and placement of a PICC line for 6 weeks IV antibiotics. Infectious disease was consulted for long-term antibiotic treatment and the hospitalist was consulted for treatment of patient's undiagnosed diabetes.  Consults: ID and Hospitalist  Significant Diagnostic Studies: labs: Routine labs and microbiology: wound culture: positive for Gram-positive cocci  Treatments: antibiotics: Ancef, insulin: regular and surgery: See operative note  Discharge Exam: Blood pressure 108/47, pulse 67, temperature 98.1 F (36.7 C), temperature source Oral, resp. rate 16, height 5\' 10"  (1.778 m), weight 126.1 kg (278 lb), SpO2 94 %. Incision/Wound: Wound VAC functioning well  Disposition: 01-Home or Self Care     Medication List    ASK your doctor about these medications        alfuzosin 10 MG 24 hr tablet  Commonly known as:  UROXATRAL  Take 10 mg by mouth daily with breakfast.     aspirin 325 MG EC tablet  Take 325 mg by mouth daily.     atorvastatin 40 MG tablet  Commonly known as:  LIPITOR  Take 1 tablet (40 mg total) by mouth daily.     carvedilol 12.5 MG tablet  Commonly known as:  COREG  Take 1 tablet (12.5 mg total) by mouth 2 (two) times daily with a meal.     clotrimazole-betamethasone cream  Commonly known as:  LOTRISONE  Apply 1 application topically  daily as needed (for rash).     docusate sodium 100 MG capsule  Commonly known as:  COLACE  Take 100 mg by mouth 2 (two) times daily.     doxycycline 100 MG capsule  Commonly known as:  VIBRAMYCIN  Take 100 mg by mouth 2 (two) times daily. For 30 days     finasteride 5 MG tablet  Commonly known as:  PROSCAR  Take 5 mg by mouth daily.     Fish Oil 1000 MG Caps  Take 1,000 mg by mouth 2 (two) times daily.     FLUoxetine 40 MG capsule  Commonly known as:  PROZAC  Take 1 capsule (40 mg total) by mouth daily.     furosemide 40 MG tablet  Commonly known as:  LASIX  Take 0.5 tablets (20 mg total) by mouth 2 (two) times daily.     gabapentin 300 MG capsule  Commonly known as:  NEURONTIN  Take 1 capsule (300 mg total) by mouth 2 (two) times daily.     lisinopril 20 MG tablet  Commonly known as:  PRINIVIL,ZESTRIL  Take 1 tablet (20 mg total) by mouth daily.     LORazepam 1 MG tablet  Commonly known as:  ATIVAN  Take 1 tablet (1 mg total) by mouth 3 (three) times daily as needed.     multivitamin tablet  Take 1 tablet by mouth daily.     oxyCODONE-acetaminophen 5-325 MG tablet  Commonly known as:  ROXICET  Take 1 tablet by mouth every 4 (four) hours as needed for severe  pain.     pantoprazole 40 MG tablet  Commonly known as:  PROTONIX  Take 1 tablet (40 mg total) by mouth 2 (two) times daily.     potassium chloride SA 20 MEQ tablet  Commonly known as:  K-DUR,KLOR-CON  Take 1 tablet (20 mEq total) by mouth 2 (two) times daily.     SYSTANE ULTRA 0.4-0.3 % Soln  Generic drug:  Polyethyl Glycol-Propyl Glycol  Place 1 drop into both eyes daily as needed (for dry eyes).     tamsulosin 0.4 MG Caps capsule  Commonly known as:  FLOMAX  Take 1 capsule (0.4 mg total) by mouth 2 (two) times daily.     vitamin C 500 MG tablet  Commonly known as:  ASCORBIC ACID  Take 500 mg by mouth daily.           Follow-up Information    Follow up with DUDA,MARCUS V, MD In 1 week.    Specialty:  Orthopedic Surgery   Contact information:   [place] [address] [phone]       Follow up with [facility].   Why:  Someone from [facility] will contact you concerning start date and time for RN and Physical therapy.   Contact information:   [address] [place] [address] [phone]       Follow up In 1 week.      Signed: DUDA,MARCUS V 08/31/2015, 6:49 AM

## 2015-08-31 NOTE — Consult Note (Signed)
   [facility] CM Inpatient Consult   08/31/2015  Steven Charles 26-Mar-1946 [id number]    Patient evaluated for long-term disease management services with [facility] program. Martin Majestic to bedside to discuss and offer [facility] services. Patient is agreeable and written consent signed. Explained to patient that he will receive post hospital transition of care calls and will be evaluated for monthly home visits. Confirmed Primary Care MD as Dr. Jenny Reichmann.  Confirmed best contact number as [phone] home and cell is [phone] . Explained that [facility] will not interfere or replace services provided by home health.  Left [facility] Care Management packet and contact information at bedside. Discussed primary focus with be symptom and disease management for his new onset DM. Denies having any other issues with medications or with transportation. Has a supportive wife. Will make inpatient RNCM aware that patient will be followed by [facility] post hospital discharge.  Will request to be assigned to [facility] for DM education and symptom and disease management. [name] reports he will  discharge home today with [facility] on iv antibiotics.  Marthenia Rolling, MSN-Ed, RN,BSN [facility] Liaison [phone]

## 2015-08-31 NOTE — Progress Notes (Signed)
PT Cancellation Note  Patient Details Name: Steven Charles MRN: [id number] DOB: 1946/03/07   Cancelled Treatment:    Reason Eval/Treat Not Completed: Patient using bed pan.  Will check back as schedule permits.    Kyrie Bun LUBECK 08/31/2015, 9:21 AM

## 2015-08-31 NOTE — Progress Notes (Signed)
Occupational Therapy Treatment Patient Details Name: Steven Charles MRN: [id number] DOB: 1945-12-14 Today's Date: 08/31/2015    History of present illness Patient is a 70 year old gentleman who is several years status post total knee arthroplasty developed a prepatellar bursal infection. Patient initially underwent irrigation debridement and placement of antibiotic beads with Dr. Marlou Sa (08/22/15) . After discharge patient's intra-articular cultures were positive for coag negative staph. Patient presents at this time for irrigation and debridement of the left knee removal polyethylene tray placement of antibiotic beads placement of PICC line was 6 weeks IV antibiotics.   OT comments  Pt. Making gains with skilled OT.  Bed mobility remains a limitation but pt. Reports it was easier today than yesterday.  Pt. With continuous wavering statements between ready for home vs needing skilled nursing.  D/c plan issues Appear to be  between his dtr. And spouse (per pt.).  At this time plan appears to be home.    Follow Up Recommendations  Home health OT;Supervision/Assistance - 24 hour    Equipment Recommendations  3 in 1 bedside comode    Recommendations for Other Services Other (comment)    Precautions / Restrictions Precautions Precaution Comments: wound vac Required Braces or Orthoses: Knee Immobilizer - Left Knee Immobilizer - Left: On at all times Restrictions LLE Weight Bearing: Weight bearing as tolerated       Mobility Bed Mobility Overal bed mobility: Needs Assistance Bed Mobility: Supine to Sit     Supine to sit: Mod assist     General bed mobility comments: hob 15 degrees (per pt. specifications that his back support pillows equal 15 degrees elevation), no rails.  pt. able to guide LLE with RLE hooked under to initaite movement towards eob.  this worked in terms of getting b les towards eob but pt. with notable difficulty bringing trunk upright wtihout back suppoprt from therapist  asst.    Transfers Overall transfer level: Needs assistance Equipment used: Rolling walker (2 wheeled) Transfers: Sit to/from [facility] Sit to Stand: Min guard Stand pivot transfers: Min guard            Balance                                   ADL Overall ADL's : Needs assistance/impaired                         Toilet Transfer: Min Marine scientist Details (indicate cue type and reason): simulated from eob with approx. 4 steps to recliner Toileting- Clothing Manipulation and Hygiene: Moderate assistance;Sit to/from stand Toileting - Clothing Manipulation Details (indicate cue type and reason): simulated during in room transfer     Functional mobility during ADLs: Min guard;Rolling walker General ADL Comments: moves slow but was steady on his feet. reports wife is the one that "really" wants him to come home. "i never said i wouldn't go skilled nursing, my wife wants me home"  reviewed it looks like home and our understanding was decision was driven by him.  he further explained his dtr. does not agree with him going home especially due to the other family members receiving care from his wife in the home.  pt. even with wavering statements says he feels hell be "okay" at home.  bed mobility remains an issue but he is planning on recliner until he is more independent with bed mobilty with  HHPT      Vision                     Perception     Praxis      Cognition   Behavior During Therapy: WFL for tasks assessed/performed Overall Cognitive Status: Within Functional Limits for tasks assessed                       Extremity/Trunk Assessment               Exercises     Shoulder Instructions       General Comments      Pertinent Vitals/ Pain       Pain Assessment: No/denies pain  Home Living                                          Prior Functioning/Environment               Frequency Min 2X/week     Progress Toward Goals  OT Goals(current goals can now be found in the care plan section)  Progress towards OT goals: Progressing toward goals     Plan Discharge plan remains appropriate    Co-evaluation                 End of Session Equipment Utilized During Treatment: Gait belt;Rolling walker;Left knee immobilizer   Activity Tolerance Patient tolerated treatment well   Patient Left in chair;with call bell/phone within reach   Nurse Communication          Time: [id number] OT Time Calculation (min): 22 min  Charges: OT General Charges $OT Visit: 1 Procedure OT Treatments $Self Care/Home Management : 8-22 mins  Janice Coffin , COTA/L 08/31/2015, 11:05 AM

## 2015-08-31 NOTE — Care Management Important Message (Signed)
Important Message  Patient Details  Name: Steven Charles MRN: [id number] Date of Birth: 02/08/1946   Medicare Important Message Given:  Yes    Loann Quill 08/31/2015, 12:03 PM

## 2015-08-31 NOTE — Discharge Instructions (Signed)

## 2015-08-31 NOTE — Consult Note (Signed)
Pharmacy Antibiotic Note  Steven Charles is a 70 y.o. male admitted on 08/28/2015 with left knee prosthetic joint infection s/p I&D on 5/27 and  s/p I&D with poly exchange 6/2. Cultures growing staph lugdunensis, oxacillin sensitive. Pharmacy has been consulted for cefazolin dosing. Noted plan per ID for 6 weeks of IV therapy.  Afeb, wbc normal at 8.7. Scr normal. No changes warranted.  Plan: 1) Cefazolin 2g IV q8  Height: 5\' 10"  (177.8 cm) Weight: 278 lb (126.1 kg) IBW/kg (Calculated) : 73  Temp (24hrs), Avg:98.1 F (36.7 C), Min:98.1 F (36.7 C), Max:98.1 F (36.7 C)   Recent Labs Lab 08/29/15 0645 08/30/15 0644 08/31/15 0632  WBC 7.7 8.3 8.7  CREATININE 1.56* 1.05 1.01    Estimated Creatinine Clearance: 88.1 mL/min (by C-G formula based on Cr of 1.01).    Allergies  Allergen Reactions  . Nicoderm [Nicotine] Other (See Comments)    Heart rate dropped   . Adhesive [Tape] Itching and Rash    Please use "paper" tape    Antimicrobials this admission: 6/2 Cefazolin >> 6/2 IV Clindamycin x 2 doses  Dose adjustments this admission: n/a  Microbiology results: 5/27 left knee synovial fluid x 3 >> staph lugdunensis (oxacillin sensitive) 6/2 left knee synovial fluid >>GPC  Thank you for allowing pharmacy to be a part of this patient's care.  Erin Hearing PharmD., BCPS Clinical Pharmacist 08/31/2015 11:12 AM

## 2015-09-01 ENCOUNTER — Other Ambulatory Visit: Payer: Self-pay | Admitting: *Deleted

## 2015-09-01 ENCOUNTER — Telehealth: Payer: Self-pay | Admitting: Internal Medicine

## 2015-09-01 DIAGNOSIS — E1100 Type 2 diabetes mellitus with hyperosmolarity without nonketotic hyperglycemic-hyperosmolar coma (NKHHC): Secondary | ICD-10-CM

## 2015-09-01 DIAGNOSIS — I110 Hypertensive heart disease with heart failure: Secondary | ICD-10-CM | POA: Diagnosis not present

## 2015-09-01 DIAGNOSIS — Z452 Encounter for adjustment and management of vascular access device: Secondary | ICD-10-CM | POA: Diagnosis not present

## 2015-09-01 DIAGNOSIS — Z5181 Encounter for therapeutic drug level monitoring: Secondary | ICD-10-CM | POA: Diagnosis not present

## 2015-09-01 DIAGNOSIS — I509 Heart failure, unspecified: Secondary | ICD-10-CM | POA: Diagnosis not present

## 2015-09-01 DIAGNOSIS — K219 Gastro-esophageal reflux disease without esophagitis: Secondary | ICD-10-CM | POA: Diagnosis not present

## 2015-09-01 DIAGNOSIS — T8454XA Infection and inflammatory reaction due to internal left knee prosthesis, initial encounter: Secondary | ICD-10-CM | POA: Diagnosis not present

## 2015-09-01 DIAGNOSIS — I2510 Atherosclerotic heart disease of native coronary artery without angina pectoris: Secondary | ICD-10-CM | POA: Diagnosis not present

## 2015-09-01 DIAGNOSIS — E119 Type 2 diabetes mellitus without complications: Secondary | ICD-10-CM | POA: Diagnosis not present

## 2015-09-01 LAB — TYPE AND SCREEN
ABO/RH(D): A NEG
ANTIBODY SCREEN: NEGATIVE
UNIT DIVISION: 0
Unit division: 0

## 2015-09-01 MED ORDER — ONETOUCH ULTRA 2 W/DEVICE KIT: PACK | Status: DC

## 2015-09-01 MED ORDER — GLUCOSE BLOOD VI STRP
ORAL_STRIP | Status: DC
Start: 2015-09-01 — End: 2016-09-20

## 2015-09-01 NOTE — Telephone Encounter (Signed)
Blood glucose meter sent to pharmacy. Please have him check his sugars 2x per day to start and record the results until seen by Dr. Jenny Reichmann.

## 2015-09-01 NOTE — Patient Outreach (Signed)
Initial transition of care call, covering RN called patient for assigned RNCM  Patient hospitalized from 6/2 to 6/5 New diabetic and septic knee   Spoke with patient for initial TOC assessment. Patient states Home health has been out for IV medications, PT to start next week. Patient has f/u MD appointment this Friday. Patient wife has prescriptions at pharmacy now and he is getting a new meter for blood sugars. Patient states he has good support from wife and friends.  He appreciates call and looks forward to learning about his diabetes when he is feeling better from his recent surgery.  Assessment: Transition of care Introduced [facility] program Gave RNCM contact.  Plan to report to assigned RNCM for ongoing transition of care program calls. Royetta Crochet. Laymond Purser, RN, BSN, [place] [phone]

## 2015-09-01 NOTE — Telephone Encounter (Signed)
Patient's wife called in to advise that the patient was dx with type II DM while in the hospital. She advised that he is not able to come in for an appointment at this point, due to his surgery. She is asking that we call in a prescription for testing strips and meter to [facility] in [place].

## 2015-09-02 ENCOUNTER — Other Ambulatory Visit: Payer: Self-pay

## 2015-09-02 ENCOUNTER — Telehealth: Payer: Self-pay | Admitting: *Deleted

## 2015-09-02 LAB — BODY FLUID CULTURE

## 2015-09-02 MED ORDER — FREESTYLE LANCETS MISC
Status: DC
Start: 2015-09-02 — End: 2017-06-08

## 2015-09-02 NOTE — Telephone Encounter (Signed)
Pt was on TCM list admitted for Infected (L) knee arthroplasty. D/C 6/5 will be f/u w/Dr. Sharol Given in 1 week.Marland KitchenJohny Chess

## 2015-09-03 DIAGNOSIS — T8454XA Infection and inflammatory reaction due to internal left knee prosthesis, initial encounter: Secondary | ICD-10-CM | POA: Diagnosis not present

## 2015-09-07 DIAGNOSIS — I2510 Atherosclerotic heart disease of native coronary artery without angina pectoris: Secondary | ICD-10-CM | POA: Diagnosis not present

## 2015-09-07 DIAGNOSIS — I110 Hypertensive heart disease with heart failure: Secondary | ICD-10-CM | POA: Diagnosis not present

## 2015-09-07 DIAGNOSIS — E119 Type 2 diabetes mellitus without complications: Secondary | ICD-10-CM | POA: Diagnosis not present

## 2015-09-07 DIAGNOSIS — K219 Gastro-esophageal reflux disease without esophagitis: Secondary | ICD-10-CM | POA: Diagnosis not present

## 2015-09-07 DIAGNOSIS — T8454XA Infection and inflammatory reaction due to internal left knee prosthesis, initial encounter: Secondary | ICD-10-CM | POA: Diagnosis not present

## 2015-09-07 DIAGNOSIS — I509 Heart failure, unspecified: Secondary | ICD-10-CM | POA: Diagnosis not present

## 2015-09-07 DIAGNOSIS — Z5181 Encounter for therapeutic drug level monitoring: Secondary | ICD-10-CM | POA: Diagnosis not present

## 2015-09-07 DIAGNOSIS — Z452 Encounter for adjustment and management of vascular access device: Secondary | ICD-10-CM | POA: Diagnosis not present

## 2015-09-07 NOTE — Telephone Encounter (Signed)
Left message with patients son to have patient give [place] a call

## 2015-09-08 DIAGNOSIS — Z452 Encounter for adjustment and management of vascular access device: Secondary | ICD-10-CM | POA: Diagnosis not present

## 2015-09-08 DIAGNOSIS — K219 Gastro-esophageal reflux disease without esophagitis: Secondary | ICD-10-CM | POA: Diagnosis not present

## 2015-09-08 DIAGNOSIS — Z5181 Encounter for therapeutic drug level monitoring: Secondary | ICD-10-CM | POA: Diagnosis not present

## 2015-09-08 DIAGNOSIS — I110 Hypertensive heart disease with heart failure: Secondary | ICD-10-CM | POA: Diagnosis not present

## 2015-09-08 DIAGNOSIS — T8454XA Infection and inflammatory reaction due to internal left knee prosthesis, initial encounter: Secondary | ICD-10-CM | POA: Diagnosis not present

## 2015-09-08 DIAGNOSIS — I509 Heart failure, unspecified: Secondary | ICD-10-CM | POA: Diagnosis not present

## 2015-09-08 DIAGNOSIS — I2510 Atherosclerotic heart disease of native coronary artery without angina pectoris: Secondary | ICD-10-CM | POA: Diagnosis not present

## 2015-09-08 DIAGNOSIS — E119 Type 2 diabetes mellitus without complications: Secondary | ICD-10-CM | POA: Diagnosis not present

## 2015-09-09 ENCOUNTER — Other Ambulatory Visit (HOSPITAL_COMMUNITY)
Admission: RE | Admit: 2015-09-09 | Discharge: 2015-09-09 | Disposition: A | Discharge status: Home / Self Care | Payer: Medicare Other | Source: Ambulatory Visit | Attending: Orthopedic Surgery | Admitting: Orthopedic Surgery

## 2015-09-09 DIAGNOSIS — I2510 Atherosclerotic heart disease of native coronary artery without angina pectoris: Secondary | ICD-10-CM | POA: Diagnosis not present

## 2015-09-09 DIAGNOSIS — Z5181 Encounter for therapeutic drug level monitoring: Secondary | ICD-10-CM | POA: Insufficient documentation

## 2015-09-09 DIAGNOSIS — E119 Type 2 diabetes mellitus without complications: Secondary | ICD-10-CM | POA: Diagnosis not present

## 2015-09-09 DIAGNOSIS — I110 Hypertensive heart disease with heart failure: Secondary | ICD-10-CM | POA: Diagnosis not present

## 2015-09-09 DIAGNOSIS — Z452 Encounter for adjustment and management of vascular access device: Secondary | ICD-10-CM | POA: Diagnosis not present

## 2015-09-09 DIAGNOSIS — K219 Gastro-esophageal reflux disease without esophagitis: Secondary | ICD-10-CM | POA: Diagnosis not present

## 2015-09-09 DIAGNOSIS — I509 Heart failure, unspecified: Secondary | ICD-10-CM | POA: Diagnosis not present

## 2015-09-09 DIAGNOSIS — T8454XA Infection and inflammatory reaction due to internal left knee prosthesis, initial encounter: Secondary | ICD-10-CM | POA: Diagnosis not present

## 2015-09-09 LAB — CBC WITH DIFFERENTIAL/PLATELET
BASOS ABS: 0 10*3/uL (ref 0.0–0.1)
BASOS PCT: 0 %
Eosinophils Absolute: 0.2 10*3/uL (ref 0.0–0.7)
Eosinophils Relative: 3 %
HEMATOCRIT: 27.2 % — AB (ref 39.0–52.0)
Hemoglobin: 8.9 g/dL — ABNORMAL LOW (ref 13.0–17.0)
Lymphocytes Relative: 18 %
Lymphs Abs: 1.3 10*3/uL (ref 0.7–4.0)
MCH: 29.1 pg (ref 26.0–34.0)
MCHC: 32.7 g/dL (ref 30.0–36.0)
MCV: 88.9 fL (ref 78.0–100.0)
MONO ABS: 0.5 10*3/uL (ref 0.1–1.0)
Monocytes Relative: 7 %
NEUTROS ABS: 5.3 10*3/uL (ref 1.7–7.7)
Neutrophils Relative %: 72 %
PLATELETS: 226 10*3/uL (ref 150–400)
RBC: 3.06 MIL/uL — ABNORMAL LOW (ref 4.22–5.81)
RDW: 14.4 % (ref 11.5–15.5)
WBC: 7.3 10*3/uL (ref 4.0–10.5)

## 2015-09-09 LAB — SEDIMENTATION RATE: SED RATE: 32 mm/h — AB (ref 0–16)

## 2015-09-09 LAB — BASIC METABOLIC PANEL
ANION GAP: 6 (ref 5–15)
BUN: 25 mg/dL — ABNORMAL HIGH (ref 6–20)
CALCIUM: 9.4 mg/dL (ref 8.9–10.3)
CO2: 25 mmol/L (ref 22–32)
Chloride: 101 mmol/L (ref 101–111)
Creatinine, Ser: 1.02 mg/dL (ref 0.61–1.24)
GLUCOSE: 147 mg/dL — AB (ref 65–99)
Potassium: 4.4 mmol/L (ref 3.5–5.1)
SODIUM: 132 mmol/L — AB (ref 135–145)

## 2015-09-09 LAB — C-REACTIVE PROTEIN: CRP: 2.6 mg/dL — ABNORMAL HIGH (ref <1.0)

## 2015-09-10 ENCOUNTER — Other Ambulatory Visit: Payer: Self-pay | Admitting: *Deleted

## 2015-09-10 NOTE — Patient Outreach (Signed)
Steven Charles - [facility]) Care Management  09/10/2015  Steven Charles [birth year] [id number]   I spoke with Steven Charles by phone today to follow up on his transition of care needs post revision of knee replacement. Steven Charles continues to receive home health nursing services for assessment and administration of IV Antibiotics and home health physical therapy. He says he is tired but feels he is making progress.   Steven Charles reports stable cbg's in the "130's-140's". He says he welcomes any ongoing education re: management of his diabetes. He is taking all medications as prescribed.   Steven Charles is interested in on going telephone assessments for transition of care needs and agrees to a face to face visit "In a few weeks when so many people aren't coming".   Plan: I scheduled a telephone outreach to him for the next two weeks and will plan a home/face to face visit when he is ready.    Steven Charles Management  [phone]

## 2015-09-14 DIAGNOSIS — Z96659 Presence of unspecified artificial knee joint: Secondary | ICD-10-CM | POA: Diagnosis not present

## 2015-09-15 DIAGNOSIS — Z5181 Encounter for therapeutic drug level monitoring: Secondary | ICD-10-CM | POA: Diagnosis not present

## 2015-09-15 DIAGNOSIS — K219 Gastro-esophageal reflux disease without esophagitis: Secondary | ICD-10-CM | POA: Diagnosis not present

## 2015-09-15 DIAGNOSIS — I509 Heart failure, unspecified: Secondary | ICD-10-CM | POA: Diagnosis not present

## 2015-09-15 DIAGNOSIS — I110 Hypertensive heart disease with heart failure: Secondary | ICD-10-CM | POA: Diagnosis not present

## 2015-09-15 DIAGNOSIS — I2510 Atherosclerotic heart disease of native coronary artery without angina pectoris: Secondary | ICD-10-CM | POA: Diagnosis not present

## 2015-09-15 DIAGNOSIS — E119 Type 2 diabetes mellitus without complications: Secondary | ICD-10-CM | POA: Diagnosis not present

## 2015-09-15 DIAGNOSIS — T8454XA Infection and inflammatory reaction due to internal left knee prosthesis, initial encounter: Secondary | ICD-10-CM | POA: Diagnosis not present

## 2015-09-15 DIAGNOSIS — Z452 Encounter for adjustment and management of vascular access device: Secondary | ICD-10-CM | POA: Diagnosis not present

## 2015-09-16 ENCOUNTER — Other Ambulatory Visit (HOSPITAL_COMMUNITY)
Admission: RE | Admit: 2015-09-16 | Discharge: 2015-09-16 | Disposition: A | Discharge status: Home / Self Care | Payer: Medicare Other | Source: Other Acute Inpatient Hospital | Attending: Orthopedic Surgery | Admitting: Orthopedic Surgery

## 2015-09-16 DIAGNOSIS — T8454XA Infection and inflammatory reaction due to internal left knee prosthesis, initial encounter: Secondary | ICD-10-CM | POA: Diagnosis not present

## 2015-09-16 DIAGNOSIS — Z5181 Encounter for therapeutic drug level monitoring: Secondary | ICD-10-CM | POA: Diagnosis not present

## 2015-09-16 LAB — BASIC METABOLIC PANEL
ANION GAP: 6 (ref 5–15)
BUN: 29 mg/dL — ABNORMAL HIGH (ref 6–20)
CALCIUM: 9 mg/dL (ref 8.9–10.3)
CO2: 26 mmol/L (ref 22–32)
CREATININE: 1.13 mg/dL (ref 0.61–1.24)
Chloride: 103 mmol/L (ref 101–111)
Glucose, Bld: 173 mg/dL — ABNORMAL HIGH (ref 65–99)
Potassium: 4.4 mmol/L (ref 3.5–5.1)
Sodium: 135 mmol/L (ref 135–145)

## 2015-09-16 LAB — CBC WITH DIFFERENTIAL/PLATELET
BASOS ABS: 0 10*3/uL (ref 0.0–0.1)
BASOS PCT: 0 %
EOS ABS: 0.1 10*3/uL (ref 0.0–0.7)
Eosinophils Relative: 2 %
HEMATOCRIT: 28.9 % — AB (ref 39.0–52.0)
Hemoglobin: 9.2 g/dL — ABNORMAL LOW (ref 13.0–17.0)
Lymphocytes Relative: 16 %
Lymphs Abs: 1.3 10*3/uL (ref 0.7–4.0)
MCH: 28.8 pg (ref 26.0–34.0)
MCHC: 31.8 g/dL (ref 30.0–36.0)
MCV: 90.3 fL (ref 78.0–100.0)
MONO ABS: 0.3 10*3/uL (ref 0.1–1.0)
Monocytes Relative: 4 %
NEUTROS ABS: 6.2 10*3/uL (ref 1.7–7.7)
NEUTROS PCT: 78 %
Platelets: 192 10*3/uL (ref 150–400)
RBC: 3.2 MIL/uL — ABNORMAL LOW (ref 4.22–5.81)
RDW: 14.6 % (ref 11.5–15.5)
WBC: 8 10*3/uL (ref 4.0–10.5)

## 2015-09-16 LAB — C-REACTIVE PROTEIN: CRP: 1 mg/dL — AB (ref <1.0)

## 2015-09-16 LAB — SEDIMENTATION RATE: SED RATE: 40 mm/h — AB (ref 0–16)

## 2015-09-17 ENCOUNTER — Ambulatory Visit: Payer: Medicare Other | Admitting: Nutrition

## 2015-09-17 DIAGNOSIS — T8454XA Infection and inflammatory reaction due to internal left knee prosthesis, initial encounter: Secondary | ICD-10-CM | POA: Diagnosis not present

## 2015-09-17 DIAGNOSIS — Z452 Encounter for adjustment and management of vascular access device: Secondary | ICD-10-CM | POA: Diagnosis not present

## 2015-09-17 DIAGNOSIS — I2510 Atherosclerotic heart disease of native coronary artery without angina pectoris: Secondary | ICD-10-CM | POA: Diagnosis not present

## 2015-09-17 DIAGNOSIS — E119 Type 2 diabetes mellitus without complications: Secondary | ICD-10-CM | POA: Diagnosis not present

## 2015-09-17 DIAGNOSIS — I509 Heart failure, unspecified: Secondary | ICD-10-CM | POA: Diagnosis not present

## 2015-09-17 DIAGNOSIS — K219 Gastro-esophageal reflux disease without esophagitis: Secondary | ICD-10-CM | POA: Diagnosis not present

## 2015-09-17 DIAGNOSIS — Z5181 Encounter for therapeutic drug level monitoring: Secondary | ICD-10-CM | POA: Diagnosis not present

## 2015-09-17 DIAGNOSIS — I110 Hypertensive heart disease with heart failure: Secondary | ICD-10-CM | POA: Diagnosis not present

## 2015-09-18 ENCOUNTER — Other Ambulatory Visit: Payer: Self-pay | Admitting: *Deleted

## 2015-09-18 DIAGNOSIS — E119 Type 2 diabetes mellitus without complications: Secondary | ICD-10-CM | POA: Diagnosis not present

## 2015-09-18 DIAGNOSIS — Z5181 Encounter for therapeutic drug level monitoring: Secondary | ICD-10-CM | POA: Diagnosis not present

## 2015-09-18 DIAGNOSIS — I2510 Atherosclerotic heart disease of native coronary artery without angina pectoris: Secondary | ICD-10-CM | POA: Diagnosis not present

## 2015-09-18 DIAGNOSIS — Z452 Encounter for adjustment and management of vascular access device: Secondary | ICD-10-CM | POA: Diagnosis not present

## 2015-09-18 DIAGNOSIS — I110 Hypertensive heart disease with heart failure: Secondary | ICD-10-CM | POA: Diagnosis not present

## 2015-09-18 DIAGNOSIS — I509 Heart failure, unspecified: Secondary | ICD-10-CM | POA: Diagnosis not present

## 2015-09-18 DIAGNOSIS — K219 Gastro-esophageal reflux disease without esophagitis: Secondary | ICD-10-CM | POA: Diagnosis not present

## 2015-09-18 DIAGNOSIS — T8454XA Infection and inflammatory reaction due to internal left knee prosthesis, initial encounter: Secondary | ICD-10-CM | POA: Diagnosis not present

## 2015-09-18 NOTE — Patient Outreach (Signed)
[facility]) Care Management  09/18/2015  Steven Charles 12-Jul-1945 [id number]  Steven Charles is a 70 year old male with history of Diabetes type 2, uncontrolled, Acute Kidney Injury, Hyponatremia, and recent Failed total knee arthroplasty. He was discharged from the hospital on 08/31/15 and referred to [facility] for assistance with post hospital care coordination needs and DM disease management.   I was unable to reach Mr. Debes by phone today when I called to follow up with him.   Plan: I will reach out to Mr. Douyon again next week by phone.    [facility]  [phone]

## 2015-09-21 ENCOUNTER — Ambulatory Visit: Payer: Medicare Other | Admitting: Dietician

## 2015-09-22 DIAGNOSIS — I509 Heart failure, unspecified: Secondary | ICD-10-CM | POA: Diagnosis not present

## 2015-09-22 DIAGNOSIS — I110 Hypertensive heart disease with heart failure: Secondary | ICD-10-CM | POA: Diagnosis not present

## 2015-09-22 DIAGNOSIS — Z452 Encounter for adjustment and management of vascular access device: Secondary | ICD-10-CM | POA: Diagnosis not present

## 2015-09-22 DIAGNOSIS — Z5181 Encounter for therapeutic drug level monitoring: Secondary | ICD-10-CM | POA: Diagnosis not present

## 2015-09-22 DIAGNOSIS — T8454XA Infection and inflammatory reaction due to internal left knee prosthesis, initial encounter: Secondary | ICD-10-CM | POA: Diagnosis not present

## 2015-09-22 DIAGNOSIS — E119 Type 2 diabetes mellitus without complications: Secondary | ICD-10-CM | POA: Diagnosis not present

## 2015-09-22 DIAGNOSIS — I2510 Atherosclerotic heart disease of native coronary artery without angina pectoris: Secondary | ICD-10-CM | POA: Diagnosis not present

## 2015-09-22 DIAGNOSIS — K219 Gastro-esophageal reflux disease without esophagitis: Secondary | ICD-10-CM | POA: Diagnosis not present

## 2015-09-23 ENCOUNTER — Encounter: Payer: Self-pay | Admitting: *Deleted

## 2015-09-23 ENCOUNTER — Other Ambulatory Visit: Payer: Self-pay | Admitting: *Deleted

## 2015-09-23 ENCOUNTER — Other Ambulatory Visit (HOSPITAL_COMMUNITY)
Admission: AD | Admit: 2015-09-23 | Discharge: 2015-09-23 | Disposition: A | Discharge status: Home / Self Care | Payer: Medicare Other | Source: Other Acute Inpatient Hospital | Attending: Orthopedic Surgery | Admitting: Orthopedic Surgery

## 2015-09-23 DIAGNOSIS — Z5181 Encounter for therapeutic drug level monitoring: Secondary | ICD-10-CM | POA: Diagnosis not present

## 2015-09-23 DIAGNOSIS — E870 Hyperosmolality and hypernatremia: Secondary | ICD-10-CM | POA: Insufficient documentation

## 2015-09-23 DIAGNOSIS — T8454XA Infection and inflammatory reaction due to internal left knee prosthesis, initial encounter: Secondary | ICD-10-CM | POA: Diagnosis not present

## 2015-09-23 LAB — CBC WITH DIFFERENTIAL/PLATELET
BASOS ABS: 0 10*3/uL (ref 0.0–0.1)
BASOS PCT: 0 %
EOS PCT: 5 %
Eosinophils Absolute: 0.4 10*3/uL (ref 0.0–0.7)
HCT: 31.2 % — ABNORMAL LOW (ref 39.0–52.0)
Hemoglobin: 9.9 g/dL — ABNORMAL LOW (ref 13.0–17.0)
Lymphocytes Relative: 23 %
Lymphs Abs: 1.8 10*3/uL (ref 0.7–4.0)
MCH: 28.4 pg (ref 26.0–34.0)
MCHC: 31.7 g/dL (ref 30.0–36.0)
MCV: 89.7 fL (ref 78.0–100.0)
MONO ABS: 0.6 10*3/uL (ref 0.1–1.0)
Monocytes Relative: 7 %
NEUTROS ABS: 5.3 10*3/uL (ref 1.7–7.7)
Neutrophils Relative %: 65 %
PLATELETS: 183 10*3/uL (ref 150–400)
RBC: 3.48 MIL/uL — AB (ref 4.22–5.81)
RDW: 14.6 % (ref 11.5–15.5)
WBC: 8 10*3/uL (ref 4.0–10.5)

## 2015-09-23 LAB — BASIC METABOLIC PANEL
Anion gap: 7 (ref 5–15)
BUN: 26 mg/dL — AB (ref 6–20)
CALCIUM: 9.7 mg/dL (ref 8.9–10.3)
CO2: 27 mmol/L (ref 22–32)
Chloride: 101 mmol/L (ref 101–111)
Creatinine, Ser: 1.09 mg/dL (ref 0.61–1.24)
GLUCOSE: 130 mg/dL — AB (ref 65–99)
Potassium: 4.3 mmol/L (ref 3.5–5.1)
SODIUM: 135 mmol/L (ref 135–145)

## 2015-09-23 LAB — SEDIMENTATION RATE: SED RATE: 57 mm/h — AB (ref 0–16)

## 2015-09-23 LAB — C-REACTIVE PROTEIN: CRP: 1.1 mg/dL — ABNORMAL HIGH (ref <1.0)

## 2015-09-23 NOTE — Patient Outreach (Signed)
[facility]) Care Management  09/23/2015  Steven Charles [birth year] [id number]  [name]. [name] is a 70 year old patient referred to [facility] after his recent hospital admission 08/28/15-08/31/15 for I&D and revision of the tibial component of infected left total knee arthroplasty. [name] had placement of antibiotic beads, wound VAC, and a PICC line for 6 weeks IV antibiotics.  [name] was newly diagnosed with DM Type II during this admission. He was seen by the dietician and diabetes coordinator during his hospitalization and has followed up with his primary care provider since for continued education and management of diabetes. He has been checking cbg's at home twice daily and readings, per his report, have been in the 130-140 range.   [name] was discharged to home with home health services including nursing and physical therapy. A rolling walker was provided to him prior to hospital discharge. He reports continuing to make progress with HHPT but it is currently on hold as his mother in law who lives with him and his wife passed away and he and his wife are in the midst of making arrangements.   [name] tells me he never called to make his post hospital primary care appointment as he was so busy with home health and surgery follow up appointments, he "let it get past him". I reached out to Dr. Gwynn Burly office today to ask if they would call [name] to arrange a follow up appointment.   [name] last saw his surgeon on 09/14/15 and is scheduled to see him again on 09/28/15. He is still receiving IV antibiotics and his wound vac is in place.   Plan: I will call [name] again next week to follow up on his progress but will not schedule a face to face visit until the following week as he will be seeing his surgeon on 09/28/15. [name] agrees with this plan and expresses his gratitude for the extra level of support provided by [facility].    [facility]  [phone]

## 2015-09-25 DIAGNOSIS — Z96659 Presence of unspecified artificial knee joint: Secondary | ICD-10-CM | POA: Diagnosis not present

## 2015-09-29 DIAGNOSIS — Z452 Encounter for adjustment and management of vascular access device: Secondary | ICD-10-CM | POA: Diagnosis not present

## 2015-09-29 DIAGNOSIS — Z5181 Encounter for therapeutic drug level monitoring: Secondary | ICD-10-CM | POA: Diagnosis not present

## 2015-09-29 DIAGNOSIS — K219 Gastro-esophageal reflux disease without esophagitis: Secondary | ICD-10-CM | POA: Diagnosis not present

## 2015-09-29 DIAGNOSIS — I509 Heart failure, unspecified: Secondary | ICD-10-CM | POA: Diagnosis not present

## 2015-09-29 DIAGNOSIS — T8454XA Infection and inflammatory reaction due to internal left knee prosthesis, initial encounter: Secondary | ICD-10-CM | POA: Diagnosis not present

## 2015-09-29 DIAGNOSIS — I110 Hypertensive heart disease with heart failure: Secondary | ICD-10-CM | POA: Diagnosis not present

## 2015-09-29 DIAGNOSIS — E119 Type 2 diabetes mellitus without complications: Secondary | ICD-10-CM | POA: Diagnosis not present

## 2015-09-29 DIAGNOSIS — I2510 Atherosclerotic heart disease of native coronary artery without angina pectoris: Secondary | ICD-10-CM | POA: Diagnosis not present

## 2015-09-30 ENCOUNTER — Encounter: Payer: Self-pay | Admitting: Gastroenterology

## 2015-09-30 ENCOUNTER — Other Ambulatory Visit (HOSPITAL_COMMUNITY)
Admission: RE | Admit: 2015-09-30 | Discharge: 2015-09-30 | Disposition: A | Discharge status: Home / Self Care | Payer: Medicare Other | Source: Other Acute Inpatient Hospital | Attending: Orthopedic Surgery | Admitting: Orthopedic Surgery

## 2015-09-30 DIAGNOSIS — E119 Type 2 diabetes mellitus without complications: Secondary | ICD-10-CM | POA: Diagnosis not present

## 2015-09-30 DIAGNOSIS — K219 Gastro-esophageal reflux disease without esophagitis: Secondary | ICD-10-CM | POA: Diagnosis not present

## 2015-09-30 DIAGNOSIS — Z5181 Encounter for therapeutic drug level monitoring: Secondary | ICD-10-CM | POA: Diagnosis not present

## 2015-09-30 DIAGNOSIS — I2510 Atherosclerotic heart disease of native coronary artery without angina pectoris: Secondary | ICD-10-CM | POA: Diagnosis not present

## 2015-09-30 DIAGNOSIS — I110 Hypertensive heart disease with heart failure: Secondary | ICD-10-CM | POA: Diagnosis not present

## 2015-09-30 DIAGNOSIS — E870 Hyperosmolality and hypernatremia: Secondary | ICD-10-CM | POA: Diagnosis not present

## 2015-09-30 DIAGNOSIS — T8454XA Infection and inflammatory reaction due to internal left knee prosthesis, initial encounter: Secondary | ICD-10-CM | POA: Diagnosis not present

## 2015-09-30 DIAGNOSIS — I509 Heart failure, unspecified: Secondary | ICD-10-CM | POA: Diagnosis not present

## 2015-09-30 DIAGNOSIS — Z452 Encounter for adjustment and management of vascular access device: Secondary | ICD-10-CM | POA: Diagnosis not present

## 2015-09-30 LAB — CBC
HCT: 29.7 % — ABNORMAL LOW (ref 39.0–52.0)
Hemoglobin: 9.3 g/dL — ABNORMAL LOW (ref 13.0–17.0)
MCH: 28.3 pg (ref 26.0–34.0)
MCHC: 31.3 g/dL (ref 30.0–36.0)
MCV: 90.3 fL (ref 78.0–100.0)
Platelets: 155 10*3/uL (ref 150–400)
RBC: 3.29 MIL/uL — ABNORMAL LOW (ref 4.22–5.81)
RDW: 14.7 % (ref 11.5–15.5)
WBC: 7.2 10*3/uL (ref 4.0–10.5)

## 2015-09-30 LAB — BASIC METABOLIC PANEL
Anion gap: 7 (ref 5–15)
BUN: 24 mg/dL — AB (ref 6–20)
CHLORIDE: 102 mmol/L (ref 101–111)
CO2: 26 mmol/L (ref 22–32)
CREATININE: 1.1 mg/dL (ref 0.61–1.24)
Calcium: 9.2 mg/dL (ref 8.9–10.3)
GFR calc Af Amer: >60 mL/min (ref >60)
GFR calc non Af Amer: >60 mL/min (ref >60)
GLUCOSE: 115 mg/dL — AB (ref 65–99)
Potassium: 4.2 mmol/L (ref 3.5–5.1)
Sodium: 135 mmol/L (ref 135–145)

## 2015-09-30 LAB — SEDIMENTATION RATE: SED RATE: 40 mm/h — AB (ref 0–16)

## 2015-09-30 LAB — C-REACTIVE PROTEIN: CRP: 1 mg/dL — AB (ref <1.0)

## 2015-10-02 ENCOUNTER — Ambulatory Visit (INDEPENDENT_AMBULATORY_CARE_PROVIDER_SITE_OTHER): Payer: Medicare Other | Admitting: Internal Medicine

## 2015-10-02 ENCOUNTER — Other Ambulatory Visit (INDEPENDENT_AMBULATORY_CARE_PROVIDER_SITE_OTHER): Payer: Medicare Other

## 2015-10-02 ENCOUNTER — Other Ambulatory Visit: Payer: Self-pay | Admitting: *Deleted

## 2015-10-02 ENCOUNTER — Encounter: Payer: Self-pay | Admitting: Internal Medicine

## 2015-10-02 VITALS — BP 130/78 | HR 85 | Temp 98.5°F | Resp 20 | Wt 263.0 lb

## 2015-10-02 DIAGNOSIS — E1100 Type 2 diabetes mellitus with hyperosmolarity without nonketotic hyperglycemic-hyperosmolar coma (NKHHC): Secondary | ICD-10-CM

## 2015-10-02 DIAGNOSIS — Z5181 Encounter for therapeutic drug level monitoring: Secondary | ICD-10-CM | POA: Diagnosis not present

## 2015-10-02 DIAGNOSIS — M25562 Pain in left knee: Secondary | ICD-10-CM

## 2015-10-02 DIAGNOSIS — R6889 Other general symptoms and signs: Secondary | ICD-10-CM

## 2015-10-02 DIAGNOSIS — I509 Heart failure, unspecified: Secondary | ICD-10-CM | POA: Diagnosis not present

## 2015-10-02 DIAGNOSIS — Z0001 Encounter for general adult medical examination with abnormal findings: Secondary | ICD-10-CM

## 2015-10-02 DIAGNOSIS — Z1159 Encounter for screening for other viral diseases: Secondary | ICD-10-CM

## 2015-10-02 DIAGNOSIS — I110 Hypertensive heart disease with heart failure: Secondary | ICD-10-CM | POA: Diagnosis not present

## 2015-10-02 DIAGNOSIS — E785 Hyperlipidemia, unspecified: Secondary | ICD-10-CM | POA: Diagnosis not present

## 2015-10-02 DIAGNOSIS — K219 Gastro-esophageal reflux disease without esophagitis: Secondary | ICD-10-CM | POA: Diagnosis not present

## 2015-10-02 DIAGNOSIS — E119 Type 2 diabetes mellitus without complications: Secondary | ICD-10-CM | POA: Diagnosis not present

## 2015-10-02 DIAGNOSIS — I10 Essential (primary) hypertension: Secondary | ICD-10-CM

## 2015-10-02 DIAGNOSIS — Z452 Encounter for adjustment and management of vascular access device: Secondary | ICD-10-CM | POA: Diagnosis not present

## 2015-10-02 DIAGNOSIS — I2510 Atherosclerotic heart disease of native coronary artery without angina pectoris: Secondary | ICD-10-CM | POA: Diagnosis not present

## 2015-10-02 DIAGNOSIS — T8454XA Infection and inflammatory reaction due to internal left knee prosthesis, initial encounter: Secondary | ICD-10-CM | POA: Diagnosis not present

## 2015-10-02 LAB — BASIC METABOLIC PANEL
BUN: 26 mg/dL — AB (ref 6–23)
CO2: 30 mEq/L (ref 19–32)
CREATININE: 1.36 mg/dL (ref 0.40–1.50)
Calcium: 10.1 mg/dL (ref 8.4–10.5)
Chloride: 103 mEq/L (ref 96–112)
GFR: 54.73 mL/min — AB (ref >60.00)
GLUCOSE: 134 mg/dL — AB (ref 70–99)
POTASSIUM: 4.9 meq/L (ref 3.5–5.1)
Sodium: 138 mEq/L (ref 135–145)

## 2015-10-02 LAB — HEPATIC FUNCTION PANEL
ALBUMIN: 3.6 g/dL (ref 3.5–5.2)
ALT: 1 U/L (ref 0–53)
AST: 9 U/L (ref 0–37)
Alkaline Phosphatase: 103 U/L (ref 39–117)
BILIRUBIN TOTAL: 0.4 mg/dL (ref 0.2–1.2)
Bilirubin, Direct: 0.1 mg/dL (ref 0.0–0.3)
Total Protein: 7 g/dL (ref 6.0–8.3)

## 2015-10-02 LAB — LIPID PANEL
CHOL/HDL RATIO: 5
CHOLESTEROL: 122 mg/dL (ref 0–200)
HDL: 27.1 mg/dL — AB (ref >39.00)
LDL CALC: 66 mg/dL (ref 0–99)
NonHDL: 95.14
TRIGLYCERIDES: 144 mg/dL (ref 0.0–149.0)
VLDL: 28.8 mg/dL (ref 0.0–40.0)

## 2015-10-02 LAB — PSA: PSA: 0.13 ng/mL (ref 0.10–4.00)

## 2015-10-02 LAB — HEMOGLOBIN A1C: Hgb A1c MFr Bld: 5.8 % (ref 4.6–6.5)

## 2015-10-02 NOTE — Progress Notes (Signed)
Pre visit review using our clinic review tool, if applicable. No additional management support is needed unless otherwise documented below in the visit note. 

## 2015-10-02 NOTE — Patient Instructions (Signed)
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
## 2015-10-03 LAB — HEPATITIS C ANTIBODY: HCV Ab: NEGATIVE

## 2015-10-04 DIAGNOSIS — M25562 Pain in left knee: Secondary | ICD-10-CM | POA: Insufficient documentation

## 2015-10-04 NOTE — Assessment & Plan Note (Signed)
stable overall by history and exam, recent data reviewed with pt, and pt to continue medical treatment as before,  to f/u any worsening symptoms or concerns Lab Results  Component Value Date   LDLCALC 66 10/02/2015

## 2015-10-04 NOTE — Assessment & Plan Note (Addendum)
S/p complicated recent surgical hx, to f/u ortho as planned, avoid narcotics  In addition to the time spent performing CPE, I spent an additional 15 minutes face to face,in which greater than 50% of this time was spent in counseling and coordination of care for patient's illness as documented.

## 2015-10-04 NOTE — Assessment & Plan Note (Signed)

## 2015-10-04 NOTE — Assessment & Plan Note (Signed)
stable overall by history and exam, recent data reviewed with pt, and pt to continue medical treatment as before,  to f/u any worsening symptoms or concerns Lab Results  Component Value Date   HGBA1C 5.8 10/02/2015

## 2015-10-04 NOTE — Assessment & Plan Note (Signed)
stable overall by history and exam, recent data reviewed with pt, and pt to continue medical treatment as before,  to f/u any worsening symptoms or concerns BP Readings from Last 3 Encounters:  10/02/15 130/78  08/31/15 108/47  08/24/15 109/49

## 2015-10-05 DIAGNOSIS — Z5181 Encounter for therapeutic drug level monitoring: Secondary | ICD-10-CM | POA: Diagnosis not present

## 2015-10-05 DIAGNOSIS — I110 Hypertensive heart disease with heart failure: Secondary | ICD-10-CM | POA: Diagnosis not present

## 2015-10-05 DIAGNOSIS — K219 Gastro-esophageal reflux disease without esophagitis: Secondary | ICD-10-CM | POA: Diagnosis not present

## 2015-10-05 DIAGNOSIS — I509 Heart failure, unspecified: Secondary | ICD-10-CM | POA: Diagnosis not present

## 2015-10-05 DIAGNOSIS — I2510 Atherosclerotic heart disease of native coronary artery without angina pectoris: Secondary | ICD-10-CM | POA: Diagnosis not present

## 2015-10-05 DIAGNOSIS — T8454XA Infection and inflammatory reaction due to internal left knee prosthesis, initial encounter: Secondary | ICD-10-CM | POA: Diagnosis not present

## 2015-10-05 DIAGNOSIS — Z452 Encounter for adjustment and management of vascular access device: Secondary | ICD-10-CM | POA: Diagnosis not present

## 2015-10-05 DIAGNOSIS — E119 Type 2 diabetes mellitus without complications: Secondary | ICD-10-CM | POA: Diagnosis not present

## 2015-10-06 DIAGNOSIS — T8454XA Infection and inflammatory reaction due to internal left knee prosthesis, initial encounter: Secondary | ICD-10-CM | POA: Diagnosis not present

## 2015-10-06 DIAGNOSIS — Z5181 Encounter for therapeutic drug level monitoring: Secondary | ICD-10-CM | POA: Diagnosis not present

## 2015-10-06 DIAGNOSIS — E119 Type 2 diabetes mellitus without complications: Secondary | ICD-10-CM | POA: Diagnosis not present

## 2015-10-06 DIAGNOSIS — K219 Gastro-esophageal reflux disease without esophagitis: Secondary | ICD-10-CM | POA: Diagnosis not present

## 2015-10-06 DIAGNOSIS — Z452 Encounter for adjustment and management of vascular access device: Secondary | ICD-10-CM | POA: Diagnosis not present

## 2015-10-06 DIAGNOSIS — I2510 Atherosclerotic heart disease of native coronary artery without angina pectoris: Secondary | ICD-10-CM | POA: Diagnosis not present

## 2015-10-06 DIAGNOSIS — I110 Hypertensive heart disease with heart failure: Secondary | ICD-10-CM | POA: Diagnosis not present

## 2015-10-06 DIAGNOSIS — I509 Heart failure, unspecified: Secondary | ICD-10-CM | POA: Diagnosis not present

## 2015-10-07 ENCOUNTER — Ambulatory Visit (INDEPENDENT_AMBULATORY_CARE_PROVIDER_SITE_OTHER): Payer: Medicare Other | Admitting: Infectious Disease

## 2015-10-07 ENCOUNTER — Other Ambulatory Visit (HOSPITAL_COMMUNITY)
Admission: RE | Admit: 2015-10-07 | Discharge: 2015-10-07 | Disposition: A | Discharge status: Home / Self Care | Payer: Medicare Other | Source: Other Acute Inpatient Hospital | Attending: Orthopedic Surgery | Admitting: Orthopedic Surgery

## 2015-10-07 ENCOUNTER — Encounter: Payer: Self-pay | Admitting: Infectious Disease

## 2015-10-07 VITALS — BP 96/60 | HR 65 | Temp 97.8°F | Wt 262.0 lb

## 2015-10-07 DIAGNOSIS — E870 Hyperosmolality and hypernatremia: Secondary | ICD-10-CM | POA: Diagnosis not present

## 2015-10-07 DIAGNOSIS — Z96659 Presence of unspecified artificial knee joint: Secondary | ICD-10-CM

## 2015-10-07 DIAGNOSIS — T8459XA Infection and inflammatory reaction due to other internal joint prosthesis, initial encounter: Secondary | ICD-10-CM

## 2015-10-07 DIAGNOSIS — T8450XA Infection and inflammatory reaction due to unspecified internal joint prosthesis, initial encounter: Secondary | ICD-10-CM | POA: Insufficient documentation

## 2015-10-07 DIAGNOSIS — Z5181 Encounter for therapeutic drug level monitoring: Secondary | ICD-10-CM | POA: Insufficient documentation

## 2015-10-07 DIAGNOSIS — T8450XS Infection and inflammatory reaction due to unspecified internal joint prosthesis, sequela: Secondary | ICD-10-CM

## 2015-10-07 DIAGNOSIS — T8454XA Infection and inflammatory reaction due to internal left knee prosthesis, initial encounter: Secondary | ICD-10-CM | POA: Diagnosis not present

## 2015-10-07 DIAGNOSIS — T8459XS Infection and inflammatory reaction due to other internal joint prosthesis, sequela: Secondary | ICD-10-CM

## 2015-10-07 DIAGNOSIS — A4901 Methicillin susceptible Staphylococcus aureus infection, unspecified site: Secondary | ICD-10-CM

## 2015-10-07 HISTORY — DX: Infection and inflammatory reaction due to other internal joint prosthesis, initial encounter: T84.59XA

## 2015-10-07 HISTORY — DX: Presence of unspecified artificial knee joint: Z96.659

## 2015-10-07 LAB — CBC WITH DIFFERENTIAL/PLATELET
BASOS ABS: 0 10*3/uL (ref 0.0–0.1)
BASOS PCT: 0 %
EOS ABS: 0.3 10*3/uL (ref 0.0–0.7)
EOS PCT: 6 %
HCT: 27.7 % — ABNORMAL LOW (ref 39.0–52.0)
Hemoglobin: 8.8 g/dL — ABNORMAL LOW (ref 13.0–17.0)
Lymphocytes Relative: 26 %
Lymphs Abs: 1.6 10*3/uL (ref 0.7–4.0)
MCH: 28.6 pg (ref 26.0–34.0)
MCHC: 31.8 g/dL (ref 30.0–36.0)
MCV: 89.9 fL (ref 78.0–100.0)
Monocytes Absolute: 0.4 10*3/uL (ref 0.1–1.0)
Monocytes Relative: 7 %
NEUTROS PCT: 61 %
Neutro Abs: 3.7 10*3/uL (ref 1.7–7.7)
PLATELETS: 152 10*3/uL (ref 150–400)
RBC: 3.08 MIL/uL — AB (ref 4.22–5.81)
RDW: 14.4 % (ref 11.5–15.5)
WBC: 6 10*3/uL (ref 4.0–10.5)

## 2015-10-07 LAB — BASIC METABOLIC PANEL
Anion gap: 7 (ref 5–15)
BUN: 26 mg/dL — ABNORMAL HIGH (ref 6–20)
CALCIUM: 9.3 mg/dL (ref 8.9–10.3)
CHLORIDE: 107 mmol/L (ref 101–111)
CO2: 25 mmol/L (ref 22–32)
CREATININE: 1.1 mg/dL (ref 0.61–1.24)
Glucose, Bld: 121 mg/dL — ABNORMAL HIGH (ref 65–99)
POTASSIUM: 4.3 mmol/L (ref 3.5–5.1)
SODIUM: 139 mmol/L (ref 135–145)

## 2015-10-07 LAB — C-REACTIVE PROTEIN: CRP: 0.8 mg/dL (ref <1.0)

## 2015-10-07 LAB — SEDIMENTATION RATE: SED RATE: 37 mm/h — AB (ref 0–16)
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
## 2015-10-09 ENCOUNTER — Other Ambulatory Visit: Payer: Self-pay | Admitting: *Deleted

## 2015-10-09 DIAGNOSIS — Z452 Encounter for adjustment and management of vascular access device: Secondary | ICD-10-CM | POA: Diagnosis not present

## 2015-10-09 DIAGNOSIS — I509 Heart failure, unspecified: Secondary | ICD-10-CM | POA: Diagnosis not present

## 2015-10-09 DIAGNOSIS — I110 Hypertensive heart disease with heart failure: Secondary | ICD-10-CM | POA: Diagnosis not present

## 2015-10-09 DIAGNOSIS — E119 Type 2 diabetes mellitus without complications: Secondary | ICD-10-CM | POA: Diagnosis not present

## 2015-10-09 DIAGNOSIS — Z5181 Encounter for therapeutic drug level monitoring: Secondary | ICD-10-CM | POA: Diagnosis not present

## 2015-10-09 DIAGNOSIS — K219 Gastro-esophageal reflux disease without esophagitis: Secondary | ICD-10-CM | POA: Diagnosis not present

## 2015-10-09 DIAGNOSIS — I2510 Atherosclerotic heart disease of native coronary artery without angina pectoris: Secondary | ICD-10-CM | POA: Diagnosis not present

## 2015-10-09 DIAGNOSIS — T8454XA Infection and inflammatory reaction due to internal left knee prosthesis, initial encounter: Secondary | ICD-10-CM | POA: Diagnosis not present

## 2015-10-09 NOTE — Patient Outreach (Signed)
[facility]) Care Management  10/09/2015  TAMEL KEAR [birth year] [id number]  Unable to reach Mr. Langfitt by phone today.   Plan: I will call Mr. Poche on Tuesday for follow up.    [facility]  [phone]

## 2015-10-10 NOTE — Pre-Procedure Instructions (Signed)
MINDY VERZOSA  10/10/2015      Your procedure is scheduled on July 18.  Report to [facility] Admitting at 1 P.M.  Call this number if you have problems the morning of surgery:  [phone]   Remember:  Do not eat food or drink liquids after midnight.  Take these medicines the morning of surgery with A SIP OF WATER Uroxatraol, Carvedilol, Ancef 2 grams, Finasteride, Prozac, Gabapentin, Lorazepam (if needed), Oxycodone (if needed), Pantoprazole,  Eye drops, Flomax   STOP Fish Oil, Multiple Vitamin, Asprin, Vitamin C today   STOP/ Do not take Aspirin, Aleve, Naproxen, Advil, Ibuprofen, Motrin, Vitamins, Herbs, or Supplements starting today    How to Manage Your Diabetes Before and After Surgery  Why is it important to control my blood sugar before and after surgery? . Improving blood sugar levels before and after surgery helps healing and can limit problems. . A way of improving blood sugar control is eating a healthy diet by: o  Eating less sugar and carbohydrates o  Increasing activity/exercise o  Talking with your doctor about reaching your blood sugar goals . High blood sugars (greater than 180 mg/dL) can raise your risk of infections and slow your recovery, so you will need to focus on controlling your diabetes during the weeks before surgery. . Make sure that the doctor who takes care of your diabetes knows about your planned surgery including the date and location.  How do I manage my blood sugar before surgery? . Check your blood sugar at least 4 times a day, starting 2 days before surgery, to make sure that the level is not too high or low. o Check your blood sugar the morning of your surgery when you wake up and every 2 hours until you get to the Short Stay unit. . If your blood sugar is less than 70 mg/dL, you will need to treat for low blood sugar: o Do not take insulin. o Treat a low blood sugar (less than 70 mg/dL) with  cup of clear juice (cranberry or  apple), 4 glucose tablets, OR glucose gel. o Recheck blood sugar in 15 minutes after treatment (to make sure it is greater than 70 mg/dL). If your blood sugar is not greater than 70 mg/dL on recheck, call [phone] for further instructions. . Report your blood sugar to the short stay nurse when you get to Short Stay.  . If you are admitted to the hospital after surgery: o Your blood sugar will be checked by the staff and you will probably be given insulin after surgery (instead of oral diabetes medicines) to make sure you have good blood sugar levels. o The goal for blood sugar control after surgery is 80-180 mg/dL.    Do not wear jewelry, make-up or nail polish.  Do not wear lotions, powders, or perfumes.  You may wear deoderant.  Do not shave 48 hours prior to surgery.  Men may shave face and neck.  Do not bring valuables to the hospital.  [facility] is not responsible for any belongings or valuables.  Contacts, dentures or bridgework may not be worn into surgery.  Leave your suitcase in the car.  After surgery it may be brought to your room.  For patients admitted to the hospital, discharge time will be determined by your treatment team.  Patients discharged the day of surgery will not be allowed to drive home.   [facility] - Preparing for Surgery  Before surgery,  you can play an important role.  Because skin is not sterile, your skin needs to be as free of germs as possible.  You can reduce the number of germs on you skin by washing with CHG (chlorahexidine gluconate) soap before surgery.  CHG is an antiseptic cleaner which kills germs and bonds with the skin to continue killing germs even after washing.  Please DO NOT use if you have an allergy to CHG or antibacterial soaps.  If your skin becomes reddened/irritated stop using the CHG and inform your nurse when you arrive at Short Stay.  Do not shave (including legs and underarms) for at least 48 hours prior to the first CHG  shower.  You may shave your face.  Please follow these instructions carefully:   1.  Shower with CHG Soap the night before surgery and the morning of Surgery.  2.  If you choose to wash your hair, wash your hair first as usual with your normal shampoo.  3.  After you shampoo, rinse your hair and body thoroughly to remove the shampoo.  4.  Use CHG as you would any other liquid soap.  You can apply CHG directly to the skin and wash gently with scrungie or a clean washcloth.  5.  Apply the CHG Soap to your body ONLY FROM THE NECK DOWN.  Do not use on open wounds or open sores.  Avoid contact with your eyes, ears, mouth and genitals (private parts).  Wash genitals (private parts) with your normal soap.  6.  Wash thoroughly, paying special attention to the area where your surgery will be performed.  7.  Thoroughly rinse your body with warm water from the neck down.  8.  DO NOT shower/wash with your normal soap after using and rinsing off the CHG Soap.  9.  Pat yourself dry with a clean towel.            10.  Wear clean pajamas.            11.  Place clean sheets on your bed the night of your first shower and do not sleep with pets.  Day of Surgery  Do not apply any lotions the morning of surgery.  Please wear clean clothes to the hospital/surgery center.

## 2015-10-12 ENCOUNTER — Encounter (HOSPITAL_COMMUNITY)
Admission: RE | Admit: 2015-10-12 | Discharge: 2015-10-12 | Disposition: A | Discharge status: Home / Self Care | Payer: Medicare Other | Source: Ambulatory Visit | Attending: Orthopaedic Surgery | Admitting: Orthopaedic Surgery

## 2015-10-12 ENCOUNTER — Other Ambulatory Visit: Payer: Self-pay | Admitting: Physician Assistant

## 2015-10-12 ENCOUNTER — Encounter (HOSPITAL_COMMUNITY): Payer: Self-pay

## 2015-10-12 ENCOUNTER — Telehealth: Payer: Self-pay | Admitting: *Deleted

## 2015-10-12 DIAGNOSIS — Z792 Long term (current) use of antibiotics: Secondary | ICD-10-CM | POA: Diagnosis not present

## 2015-10-12 DIAGNOSIS — I509 Heart failure, unspecified: Secondary | ICD-10-CM | POA: Diagnosis not present

## 2015-10-12 DIAGNOSIS — T8132XA Disruption of internal operation (surgical) wound, not elsewhere classified, initial encounter: Secondary | ICD-10-CM | POA: Diagnosis not present

## 2015-10-12 DIAGNOSIS — E785 Hyperlipidemia, unspecified: Secondary | ICD-10-CM | POA: Diagnosis not present

## 2015-10-12 DIAGNOSIS — I110 Hypertensive heart disease with heart failure: Secondary | ICD-10-CM | POA: Diagnosis not present

## 2015-10-12 DIAGNOSIS — Z79899 Other long term (current) drug therapy: Secondary | ICD-10-CM | POA: Diagnosis not present

## 2015-10-12 DIAGNOSIS — D62 Acute posthemorrhagic anemia: Secondary | ICD-10-CM | POA: Diagnosis not present

## 2015-10-12 DIAGNOSIS — I2510 Atherosclerotic heart disease of native coronary artery without angina pectoris: Secondary | ICD-10-CM | POA: Diagnosis not present

## 2015-10-12 DIAGNOSIS — Z96659 Presence of unspecified artificial knee joint: Secondary | ICD-10-CM | POA: Diagnosis not present

## 2015-10-12 DIAGNOSIS — T8454XA Infection and inflammatory reaction due to internal left knee prosthesis, initial encounter: Secondary | ICD-10-CM | POA: Diagnosis not present

## 2015-10-12 DIAGNOSIS — Z7982 Long term (current) use of aspirin: Secondary | ICD-10-CM | POA: Diagnosis not present

## 2015-10-12 DIAGNOSIS — I252 Old myocardial infarction: Secondary | ICD-10-CM | POA: Diagnosis not present

## 2015-10-12 DIAGNOSIS — Z87891 Personal history of nicotine dependence: Secondary | ICD-10-CM | POA: Diagnosis not present

## 2015-10-12 DIAGNOSIS — Z951 Presence of aortocoronary bypass graft: Secondary | ICD-10-CM | POA: Diagnosis not present

## 2015-10-12 LAB — GLUCOSE, CAPILLARY: Glucose-Capillary: 162 mg/dL — ABNORMAL HIGH (ref 65–99)

## 2015-10-12 NOTE — Telephone Encounter (Signed)
Patient called to advise that he is scheduled for surgery 10/13/15 and that Dr Tommy Medal wanted to know. Advised that [employer] is out of the office but I will send him a note.

## 2015-10-13 ENCOUNTER — Other Ambulatory Visit: Payer: Self-pay | Admitting: *Deleted

## 2015-10-13 ENCOUNTER — Inpatient Hospital Stay (HOSPITAL_COMMUNITY): Payer: Medicare Other | Admitting: Anesthesiology

## 2015-10-13 ENCOUNTER — Inpatient Hospital Stay (HOSPITAL_COMMUNITY)
Admission: RE | Admit: 2015-10-13 | Discharge: 2015-10-19 | DRG: 464 | Disposition: A | Discharge status: Skilled Nursing Facility | Payer: Medicare Other | Source: Ambulatory Visit | Attending: Orthopaedic Surgery | Admitting: Orthopaedic Surgery

## 2015-10-13 ENCOUNTER — Inpatient Hospital Stay (HOSPITAL_COMMUNITY): Payer: Medicare Other

## 2015-10-13 ENCOUNTER — Encounter (HOSPITAL_COMMUNITY)
Admission: RE | Disposition: A | Discharge status: Skilled Nursing Facility | Payer: Self-pay | Source: Ambulatory Visit | Attending: Orthopaedic Surgery

## 2015-10-13 ENCOUNTER — Encounter (HOSPITAL_COMMUNITY): Payer: Self-pay | Admitting: Certified Registered Nurse Anesthetist

## 2015-10-13 DIAGNOSIS — I509 Heart failure, unspecified: Secondary | ICD-10-CM | POA: Diagnosis not present

## 2015-10-13 DIAGNOSIS — Z792 Long term (current) use of antibiotics: Secondary | ICD-10-CM

## 2015-10-13 DIAGNOSIS — Z7982 Long term (current) use of aspirin: Secondary | ICD-10-CM

## 2015-10-13 DIAGNOSIS — D62 Acute posthemorrhagic anemia: Secondary | ICD-10-CM | POA: Diagnosis not present

## 2015-10-13 DIAGNOSIS — T8132XA Disruption of internal operation (surgical) wound, not elsewhere classified, initial encounter: Secondary | ICD-10-CM | POA: Diagnosis present

## 2015-10-13 DIAGNOSIS — T8459XD Infection and inflammatory reaction due to other internal joint prosthesis, subsequent encounter: Secondary | ICD-10-CM

## 2015-10-13 DIAGNOSIS — I110 Hypertensive heart disease with heart failure: Secondary | ICD-10-CM | POA: Diagnosis present

## 2015-10-13 DIAGNOSIS — I2510 Atherosclerotic heart disease of native coronary artery without angina pectoris: Secondary | ICD-10-CM | POA: Diagnosis present

## 2015-10-13 DIAGNOSIS — T8450XA Infection and inflammatory reaction due to unspecified internal joint prosthesis, initial encounter: Secondary | ICD-10-CM | POA: Diagnosis not present

## 2015-10-13 DIAGNOSIS — B999 Unspecified infectious disease: Secondary | ICD-10-CM | POA: Diagnosis not present

## 2015-10-13 DIAGNOSIS — G609 Hereditary and idiopathic neuropathy, unspecified: Secondary | ICD-10-CM | POA: Diagnosis present

## 2015-10-13 DIAGNOSIS — T8454XS Infection and inflammatory reaction due to internal left knee prosthesis, sequela: Secondary | ICD-10-CM | POA: Diagnosis not present

## 2015-10-13 DIAGNOSIS — I252 Old myocardial infarction: Secondary | ICD-10-CM | POA: Diagnosis not present

## 2015-10-13 DIAGNOSIS — Z951 Presence of aortocoronary bypass graft: Secondary | ICD-10-CM

## 2015-10-13 DIAGNOSIS — T8454XA Infection and inflammatory reaction due to internal left knee prosthesis, initial encounter: Secondary | ICD-10-CM | POA: Diagnosis not present

## 2015-10-13 DIAGNOSIS — Z87891 Personal history of nicotine dependence: Secondary | ICD-10-CM | POA: Diagnosis not present

## 2015-10-13 DIAGNOSIS — B957 Other staphylococcus as the cause of diseases classified elsewhere: Secondary | ICD-10-CM | POA: Diagnosis not present

## 2015-10-13 DIAGNOSIS — E785 Hyperlipidemia, unspecified: Secondary | ICD-10-CM | POA: Diagnosis present

## 2015-10-13 DIAGNOSIS — M00862 Arthritis due to other bacteria, left knee: Secondary | ICD-10-CM | POA: Diagnosis not present

## 2015-10-13 DIAGNOSIS — Z9889 Other specified postprocedural states: Secondary | ICD-10-CM | POA: Diagnosis not present

## 2015-10-13 DIAGNOSIS — Z79899 Other long term (current) drug therapy: Secondary | ICD-10-CM | POA: Diagnosis not present

## 2015-10-13 DIAGNOSIS — M25569 Pain in unspecified knee: Secondary | ICD-10-CM | POA: Diagnosis not present

## 2015-10-13 DIAGNOSIS — Y831 Surgical operation with implant of artificial internal device as the cause of abnormal reaction of the patient, or of later complication, without mention of misadventure at the time of the procedure: Secondary | ICD-10-CM | POA: Diagnosis present

## 2015-10-13 DIAGNOSIS — F419 Anxiety disorder, unspecified: Secondary | ICD-10-CM | POA: Diagnosis present

## 2015-10-13 DIAGNOSIS — N289 Disorder of kidney and ureter, unspecified: Secondary | ICD-10-CM | POA: Diagnosis not present

## 2015-10-13 DIAGNOSIS — Z96659 Presence of unspecified artificial knee joint: Secondary | ICD-10-CM

## 2015-10-13 DIAGNOSIS — Y792 Prosthetic and other implants, materials and accessory orthopedic devices associated with adverse incidents: Secondary | ICD-10-CM | POA: Diagnosis not present

## 2015-10-13 DIAGNOSIS — M1993 Secondary osteoarthritis, unspecified site: Secondary | ICD-10-CM | POA: Diagnosis not present

## 2015-10-13 HISTORY — PX: TOTAL KNEE REVISION: SHX996

## 2015-10-13 LAB — GLUCOSE, CAPILLARY
GLUCOSE-CAPILLARY: 117 mg/dL — AB (ref 65–99)
Glucose-Capillary: 112 mg/dL — ABNORMAL HIGH (ref 65–99)

## 2015-10-13 SURGERY — TOTAL KNEE REVISION
Anesthesia: General | Site: Knee | Laterality: Left

## 2015-10-13 MED ORDER — CHLORHEXIDINE GLUCONATE 4 % EX LIQD
60.0000 mL | Freq: Once | CUTANEOUS | Status: DC
Start: 2015-10-13 — End: 2015-10-13

## 2015-10-13 MED ORDER — FLUOXETINE HCL 20 MG PO CAPS
40.0000 mg | ORAL_CAPSULE | Freq: Every day | ORAL | Status: DC
  Administered 2015-10-14 – 2015-10-19 (×6): 40 mg via ORAL
  Filled 2015-10-13 (×6): qty 2

## 2015-10-13 MED ORDER — LISINOPRIL 20 MG PO TABS
20.0000 mg | ORAL_TABLET | Freq: Every day | ORAL | Status: DC
  Administered 2015-10-16 – 2015-10-17 (×2): 20 mg via ORAL
  Filled 2015-10-13 (×5): qty 1

## 2015-10-13 MED ORDER — HYDROMORPHONE HCL 1 MG/ML IJ SOLN
INTRAMUSCULAR | Status: AC
  Administered 2015-10-13: 0.5 mg via INTRAVENOUS
  Filled 2015-10-13: qty 1

## 2015-10-13 MED ORDER — HYDROMORPHONE HCL 1 MG/ML IJ SOLN
1.0000 mg | INTRAMUSCULAR | Status: DC | PRN
  Administered 2015-10-13 – 2015-10-17 (×7): 1 mg via INTRAVENOUS
  Filled 2015-10-13 (×7): qty 1

## 2015-10-13 MED ORDER — OXYCODONE HCL 5 MG PO TABS
5.0000 mg | ORAL_TABLET | ORAL | Status: DC | PRN
  Administered 2015-10-13: 15 mg via ORAL
  Administered 2015-10-14: 10 mg via ORAL
  Administered 2015-10-14: 15 mg via ORAL
  Administered 2015-10-14 (×2): 10 mg via ORAL
  Administered 2015-10-15: 15 mg via ORAL
  Administered 2015-10-15: 10 mg via ORAL
  Administered 2015-10-15 (×2): 15 mg via ORAL
  Administered 2015-10-15: 10 mg via ORAL
  Administered 2015-10-15 – 2015-10-18 (×11): 15 mg via ORAL
  Administered 2015-10-19: 10 mg via ORAL
  Filled 2015-10-13 (×4): qty 3
  Filled 2015-10-13: qty 2
  Filled 2015-10-13: qty 3
  Filled 2015-10-13: qty 2
  Filled 2015-10-13 (×3): qty 3
  Filled 2015-10-13 (×3): qty 2
  Filled 2015-10-13: qty 3
  Filled 2015-10-13: qty 2
  Filled 2015-10-13 (×7): qty 3

## 2015-10-13 MED ORDER — ROCURONIUM BROMIDE 100 MG/10ML IV SOLN
INTRAVENOUS | Status: DC | PRN
  Administered 2015-10-13: 50 mg via INTRAVENOUS

## 2015-10-13 MED ORDER — METOCLOPRAMIDE HCL 5 MG/ML IJ SOLN: 5.0000 mg | Freq: Three times a day (TID) | INTRAMUSCULAR | Status: DC | PRN

## 2015-10-13 MED ORDER — FENTANYL CITRATE (PF) 100 MCG/2ML IJ SOLN
INTRAMUSCULAR | Status: DC | PRN
  Administered 2015-10-13 (×3): 50 ug via INTRAVENOUS
  Administered 2015-10-13: 100 ug via INTRAVENOUS
  Administered 2015-10-13 (×5): 50 ug via INTRAVENOUS

## 2015-10-13 MED ORDER — EPHEDRINE SULFATE-NACL 50-0.9 MG/10ML-% IV SOSY
PREFILLED_SYRINGE | INTRAVENOUS | Status: DC | PRN
  Administered 2015-10-13: 10 mg via INTRAVENOUS

## 2015-10-13 MED ORDER — MIDAZOLAM HCL 2 MG/2ML IJ SOLN
INTRAMUSCULAR | Status: AC
  Filled 2015-10-13: qty 2

## 2015-10-13 MED ORDER — LIDOCAINE 2% (20 MG/ML) 5 ML SYRINGE
INTRAMUSCULAR | Status: DC | PRN
  Administered 2015-10-13: 60 mg via INTRAVENOUS

## 2015-10-13 MED ORDER — ACETAMINOPHEN 650 MG RE SUPP: 650.0000 mg | Freq: Four times a day (QID) | RECTAL | Status: DC | PRN

## 2015-10-13 MED ORDER — ACETAMINOPHEN 325 MG PO TABS
650.0000 mg | ORAL_TABLET | Freq: Four times a day (QID) | ORAL | Status: DC | PRN
  Administered 2015-10-14 – 2015-10-16 (×4): 650 mg via ORAL
  Filled 2015-10-13 (×4): qty 2

## 2015-10-13 MED ORDER — EPINEPHRINE HCL 0.1 MG/ML IJ SOSY
PREFILLED_SYRINGE | INTRAMUSCULAR | Status: AC
  Filled 2015-10-13: qty 10

## 2015-10-13 MED ORDER — FENTANYL CITRATE (PF) 250 MCG/5ML IJ SOLN
INTRAMUSCULAR | Status: AC
  Filled 2015-10-13: qty 5

## 2015-10-13 MED ORDER — ALFUZOSIN HCL ER 10 MG PO TB24
10.0000 mg | ORAL_TABLET | Freq: Every day | ORAL | Status: DC
  Administered 2015-10-14 – 2015-10-19 (×6): 10 mg via ORAL
  Filled 2015-10-13 (×6): qty 1

## 2015-10-13 MED ORDER — VANCOMYCIN HCL IN DEXTROSE 1-5 GM/200ML-% IV SOLN
1000.0000 mg | Freq: Two times a day (BID) | INTRAVENOUS | Status: DC
  Administered 2015-10-13: 1000 mg via INTRAVENOUS
  Filled 2015-10-13 (×2): qty 200

## 2015-10-13 MED ORDER — DOCUSATE SODIUM 100 MG PO CAPS
100.0000 mg | ORAL_CAPSULE | Freq: Two times a day (BID) | ORAL | Status: DC
  Administered 2015-10-13 – 2015-10-19 (×12): 100 mg via ORAL
  Filled 2015-10-13 (×12): qty 1

## 2015-10-13 MED ORDER — OXYCODONE HCL 5 MG PO TABS
ORAL_TABLET | ORAL | Status: AC
  Administered 2015-10-13: 15 mg via ORAL
  Filled 2015-10-13: qty 3

## 2015-10-13 MED ORDER — ATORVASTATIN CALCIUM 40 MG PO TABS
40.0000 mg | ORAL_TABLET | Freq: Every day | ORAL | Status: DC
  Administered 2015-10-14 – 2015-10-19 (×6): 40 mg via ORAL
  Filled 2015-10-13 (×6): qty 1

## 2015-10-13 MED ORDER — FINASTERIDE 5 MG PO TABS
5.0000 mg | ORAL_TABLET | Freq: Every day | ORAL | Status: DC
  Administered 2015-10-14 – 2015-10-19 (×6): 5 mg via ORAL
  Filled 2015-10-13 (×7): qty 1

## 2015-10-13 MED ORDER — ONDANSETRON HCL 4 MG PO TABS: 4.0000 mg | ORAL_TABLET | Freq: Four times a day (QID) | ORAL | Status: DC | PRN

## 2015-10-13 MED ORDER — HYPROMELLOSE (GONIOSCOPIC) 2.5 % OP SOLN
1.0000 [drp] | Freq: Every day | OPHTHALMIC | Status: DC | PRN
  Administered 2015-10-19: 1 [drp] via OPHTHALMIC
  Filled 2015-10-13: qty 15

## 2015-10-13 MED ORDER — VANCOMYCIN HCL 1000 MG IV SOLR
INTRAVENOUS | Status: DC | PRN
  Administered 2015-10-13: 3 g

## 2015-10-13 MED ORDER — VITAMIN C 500 MG PO TABS
500.0000 mg | ORAL_TABLET | Freq: Every day | ORAL | Status: DC
  Administered 2015-10-14 – 2015-10-19 (×6): 500 mg via ORAL
  Filled 2015-10-13 (×6): qty 1

## 2015-10-13 MED ORDER — CARVEDILOL 12.5 MG PO TABS
12.5000 mg | ORAL_TABLET | Freq: Two times a day (BID) | ORAL | Status: DC
  Administered 2015-10-16 – 2015-10-18 (×4): 12.5 mg via ORAL
  Filled 2015-10-13 (×8): qty 1

## 2015-10-13 MED ORDER — LORAZEPAM 1 MG PO TABS
1.0000 mg | ORAL_TABLET | Freq: Three times a day (TID) | ORAL | Status: DC | PRN
  Administered 2015-10-15: 1 mg via ORAL
  Filled 2015-10-13: qty 1

## 2015-10-13 MED ORDER — PHENYLEPHRINE 40 MCG/ML (10ML) SYRINGE FOR IV PUSH (FOR BLOOD PRESSURE SUPPORT)
PREFILLED_SYRINGE | INTRAVENOUS | Status: AC
  Filled 2015-10-13: qty 10

## 2015-10-13 MED ORDER — ROCURONIUM BROMIDE 50 MG/5ML IV SOLN
INTRAVENOUS | Status: AC
  Filled 2015-10-13: qty 1

## 2015-10-13 MED ORDER — LACTATED RINGERS IV SOLN
INTRAVENOUS | Status: DC
  Administered 2015-10-13 (×2): via INTRAVENOUS

## 2015-10-13 MED ORDER — PROPOFOL 10 MG/ML IV BOLUS
INTRAVENOUS | Status: DC | PRN
  Administered 2015-10-13: 180 mg via INTRAVENOUS

## 2015-10-13 MED ORDER — PANTOPRAZOLE SODIUM 40 MG PO TBEC
40.0000 mg | DELAYED_RELEASE_TABLET | Freq: Two times a day (BID) | ORAL | Status: DC
  Administered 2015-10-13 – 2015-10-19 (×12): 40 mg via ORAL
  Filled 2015-10-13 (×12): qty 1

## 2015-10-13 MED ORDER — METHOCARBAMOL 1000 MG/10ML IJ SOLN
500.0000 mg | Freq: Four times a day (QID) | INTRAVENOUS | Status: DC | PRN
  Administered 2015-10-13: 500 mg via INTRAVENOUS
  Filled 2015-10-13 (×2): qty 5

## 2015-10-13 MED ORDER — VANCOMYCIN HCL 1000 MG IV SOLR
1000.0000 mg | INTRAVENOUS | Status: DC | PRN
  Administered 2015-10-13: 1000 mg via INTRAVENOUS

## 2015-10-13 MED ORDER — MIDAZOLAM HCL 5 MG/5ML IJ SOLN
INTRAMUSCULAR | Status: DC | PRN
  Administered 2015-10-13: 2 mg via INTRAVENOUS

## 2015-10-13 MED ORDER — ADULT MULTIVITAMIN W/MINERALS CH
1.0000 | ORAL_TABLET | Freq: Every day | ORAL | Status: DC
  Administered 2015-10-14 – 2015-10-19 (×6): 1 via ORAL
  Filled 2015-10-13 (×6): qty 1

## 2015-10-13 MED ORDER — SODIUM CHLORIDE 0.9 % IR SOLN
Status: DC | PRN
  Administered 2015-10-13 (×2): 3000 mL
  Administered 2015-10-13: 1000 mL

## 2015-10-13 MED ORDER — POTASSIUM CHLORIDE CRYS ER 20 MEQ PO TBCR
20.0000 meq | EXTENDED_RELEASE_TABLET | Freq: Two times a day (BID) | ORAL | Status: DC
  Administered 2015-10-13 – 2015-10-19 (×12): 20 meq via ORAL
  Filled 2015-10-13 (×12): qty 1

## 2015-10-13 MED ORDER — PROPOFOL 10 MG/ML IV BOLUS
INTRAVENOUS | Status: AC
  Filled 2015-10-13: qty 20

## 2015-10-13 MED ORDER — ZOLPIDEM TARTRATE 5 MG PO TABS
5.0000 mg | ORAL_TABLET | Freq: Every evening | ORAL | Status: DC | PRN
  Administered 2015-10-13: 5 mg via ORAL
  Filled 2015-10-13: qty 1

## 2015-10-13 MED ORDER — FUROSEMIDE 20 MG PO TABS
20.0000 mg | ORAL_TABLET | Freq: Two times a day (BID) | ORAL | Status: DC
  Administered 2015-10-13 – 2015-10-19 (×12): 20 mg via ORAL
  Filled 2015-10-13 (×12): qty 1

## 2015-10-13 MED ORDER — METHOCARBAMOL 500 MG PO TABS
500.0000 mg | ORAL_TABLET | Freq: Four times a day (QID) | ORAL | Status: DC | PRN
  Administered 2015-10-13 – 2015-10-18 (×7): 500 mg via ORAL
  Filled 2015-10-13 (×8): qty 1

## 2015-10-13 MED ORDER — HYDROMORPHONE HCL 1 MG/ML IJ SOLN
0.2500 mg | INTRAMUSCULAR | Status: AC | PRN
  Administered 2015-10-13 (×4): 0.5 mg via INTRAVENOUS

## 2015-10-13 MED ORDER — ONDANSETRON HCL 4 MG/2ML IJ SOLN: 4.0000 mg | Freq: Four times a day (QID) | INTRAMUSCULAR | Status: DC | PRN

## 2015-10-13 MED ORDER — ONDANSETRON HCL 4 MG/2ML IJ SOLN
INTRAMUSCULAR | Status: AC
  Filled 2015-10-13: qty 2

## 2015-10-13 MED ORDER — PHENYLEPHRINE 40 MCG/ML (10ML) SYRINGE FOR IV PUSH (FOR BLOOD PRESSURE SUPPORT)
PREFILLED_SYRINGE | INTRAVENOUS | Status: DC | PRN
  Administered 2015-10-13 (×4): 80 ug via INTRAVENOUS

## 2015-10-13 MED ORDER — CEFAZOLIN SODIUM-DEXTROSE 2-4 GM/100ML-% IV SOLN
2.0000 g | Freq: Three times a day (TID) | INTRAVENOUS | Status: DC
  Administered 2015-10-13 – 2015-10-19 (×17): 2 g via INTRAVENOUS
  Filled 2015-10-13 (×20): qty 100

## 2015-10-13 MED ORDER — SUGAMMADEX SODIUM 200 MG/2ML IV SOLN
INTRAVENOUS | Status: DC | PRN
  Administered 2015-10-13 (×2): 100 mg via INTRAVENOUS

## 2015-10-13 MED ORDER — GABAPENTIN 300 MG PO CAPS
300.0000 mg | ORAL_CAPSULE | Freq: Two times a day (BID) | ORAL | Status: DC
  Administered 2015-10-13 – 2015-10-19 (×12): 300 mg via ORAL
  Filled 2015-10-13 (×12): qty 1

## 2015-10-13 MED ORDER — VANCOMYCIN HCL 1000 MG IV SOLR
INTRAVENOUS | Status: AC
  Filled 2015-10-13: qty 4000

## 2015-10-13 MED ORDER — DIPHENHYDRAMINE HCL 12.5 MG/5ML PO ELIX: 12.5000 mg | ORAL_SOLUTION | ORAL | Status: DC | PRN

## 2015-10-13 MED ORDER — ONDANSETRON HCL 4 MG/2ML IJ SOLN
INTRAMUSCULAR | Status: DC | PRN
  Administered 2015-10-13: 4 mg via INTRAVENOUS

## 2015-10-13 MED ORDER — METOCLOPRAMIDE HCL 5 MG PO TABS: 5.0000 mg | ORAL_TABLET | Freq: Three times a day (TID) | ORAL | Status: DC | PRN

## 2015-10-13 MED ORDER — SODIUM CHLORIDE 0.9 % IV SOLN
INTRAVENOUS | Status: DC
  Administered 2015-10-13: 75 mL/h via INTRAVENOUS

## 2015-10-13 MED ORDER — TAMSULOSIN HCL 0.4 MG PO CAPS
0.4000 mg | ORAL_CAPSULE | Freq: Two times a day (BID) | ORAL | Status: DC
  Administered 2015-10-13 – 2015-10-19 (×12): 0.4 mg via ORAL
  Filled 2015-10-13 (×12): qty 1

## 2015-10-13 MED ORDER — VANCOMYCIN HCL IN DEXTROSE 1-5 GM/200ML-% IV SOLN
INTRAVENOUS | Status: AC
  Filled 2015-10-13: qty 200

## 2015-10-13 MED ORDER — SUCCINYLCHOLINE CHLORIDE 200 MG/10ML IV SOSY
PREFILLED_SYRINGE | INTRAVENOUS | Status: AC
  Filled 2015-10-13: qty 10

## 2015-10-13 SURGICAL SUPPLY — 60 items
BANDAGE ACE 6X5 VEL STRL LF (GAUZE/BANDAGES/DRESSINGS) ×2 IMPLANT
BANDAGE ELASTIC 6 VELCRO ST LF (GAUZE/BANDAGES/DRESSINGS) ×2 IMPLANT
BANDAGE ESMARK 6X9 LF (GAUZE/BANDAGES/DRESSINGS) ×1 IMPLANT
BLADE SAG 18X100X1.27 (BLADE) ×2 IMPLANT
BLADE SAGITTAL 25.0X1.27X90 (BLADE) ×2 IMPLANT
BNDG COHESIVE 6X5 TAN STRL LF (GAUZE/BANDAGES/DRESSINGS) ×4 IMPLANT
BNDG ESMARK 6X9 LF (GAUZE/BANDAGES/DRESSINGS) ×2
BONE CEMENT GENTAMICIN (Cement) ×8 IMPLANT
BOWL SMART MIX CTS (DISPOSABLE) ×4 IMPLANT
CEMENT BONE GENTAMICIN 40 (Cement) ×4 IMPLANT
COVER MAYO STAND STRL (DRAPES) ×2 IMPLANT
COVER SURGICAL LIGHT HANDLE (MISCELLANEOUS) ×2 IMPLANT
CUFF TOURNIQUET SINGLE 34IN LL (TOURNIQUET CUFF) ×2 IMPLANT
DRAPE C-ARM 42X72 X-RAY (DRAPES) ×2 IMPLANT
DRAPE ORTHO SPLIT 77X108 STRL (DRAPES) ×2
DRAPE SURG ORHT 6 SPLT 77X108 (DRAPES) ×2 IMPLANT
DRAPE U-SHAPE 47X51 STRL (DRAPES) ×2 IMPLANT
DRSG PAD ABDOMINAL 8X10 ST (GAUZE/BANDAGES/DRESSINGS) ×2 IMPLANT
DURAPREP 26ML APPLICATOR (WOUND CARE) ×2 IMPLANT
ELECT REM PT RETURN 9FT ADLT (ELECTROSURGICAL) ×2
ELECTRODE REM PT RTRN 9FT ADLT (ELECTROSURGICAL) ×1 IMPLANT
EVACUATOR 1/8 PVC DRAIN (DRAIN) IMPLANT
FACESHIELD STD STERILE (MASK) ×6 IMPLANT
GAUZE SPONGE 4X4 12PLY STRL (GAUZE/BANDAGES/DRESSINGS) ×2 IMPLANT
GAUZE XEROFORM 1X8 LF (GAUZE/BANDAGES/DRESSINGS) ×2 IMPLANT
GAUZE XEROFORM 5X9 LF (GAUZE/BANDAGES/DRESSINGS) ×2 IMPLANT
GLOVE BIOGEL PI IND STRL 8 (GLOVE) ×2 IMPLANT
GLOVE BIOGEL PI INDICATOR 8 (GLOVE) ×2
GLOVE SS PI 9.0 STRL (GLOVE) ×2 IMPLANT
GLOVE SURG SS PI 6.5 STRL IVOR (GLOVE) ×2 IMPLANT
GLOVE SURG SS PI 7.5 STRL IVOR (GLOVE) ×4 IMPLANT
GLOVE SURG SS PI 8.0 STRL IVOR (GLOVE) ×4 IMPLANT
HANDPIECE INTERPULSE COAX TIP (DISPOSABLE) ×1
IMMOBILIZER KNEE 22 (SOFTGOODS) ×2 IMPLANT
IMMOBILIZER KNEE 22 UNIV (SOFTGOODS) ×2 IMPLANT
KIT BASIN OR (CUSTOM PROCEDURE TRAY) ×2 IMPLANT
KIT ROOM TURNOVER OR (KITS) ×2 IMPLANT
MANIFOLD NEPTUNE II (INSTRUMENTS) ×2 IMPLANT
NS IRRIG 1000ML POUR BTL (IV SOLUTION) ×2 IMPLANT
PACK TOTAL JOINT (CUSTOM PROCEDURE TRAY) ×2 IMPLANT
PAD ARMBOARD 7.5X6 YLW CONV (MISCELLANEOUS) ×4 IMPLANT
PADDING CAST COTTON 6X4 STRL (CAST SUPPLIES) ×2 IMPLANT
SET HNDPC FAN SPRY TIP SCT (DISPOSABLE) ×1 IMPLANT
SET PAD KNEE POSITIONER (MISCELLANEOUS) ×2 IMPLANT
STAPLER VISISTAT 35W (STAPLE) ×2 IMPLANT
SUCTION FRAZIER HANDLE 10FR (MISCELLANEOUS) ×1
SUCTION TUBE FRAZIER 10FR DISP (MISCELLANEOUS) ×1 IMPLANT
SUT VIC AB 0 CT1 27 (SUTURE) ×2
SUT VIC AB 0 CT1 27XBRD ANBCTR (SUTURE) ×2 IMPLANT
SUT VIC AB 1 CT1 27 (SUTURE) ×4
SUT VIC AB 1 CT1 27XBRD ANBCTR (SUTURE) ×4 IMPLANT
SUT VIC AB 2-0 CT1 27 (SUTURE) ×2
SUT VIC AB 2-0 CT1 TAPERPNT 27 (SUTURE) ×2 IMPLANT
SWAB COLLECTION DEVICE MRSA (MISCELLANEOUS) ×2 IMPLANT
TOWEL OR 17X24 6PK STRL BLUE (TOWEL DISPOSABLE) ×2 IMPLANT
TOWEL OR 17X26 10 PK STRL BLUE (TOWEL DISPOSABLE) ×2 IMPLANT
TRAY FOLEY CATH 16FRSI W/METER (SET/KITS/TRAYS/PACK) IMPLANT
TUBE ANAEROBIC SPECIMEN COL (MISCELLANEOUS) ×2 IMPLANT
WATER STERILE IRR 1000ML POUR (IV SOLUTION) ×6 IMPLANT
WRAP KNEE MAXI GEL POST OP (GAUZE/BANDAGES/DRESSINGS) ×2 IMPLANT

## 2015-10-13 NOTE — Progress Notes (Signed)
Pharmacy Antibiotic Note  Steven Charles is a 70 y.o. male admitted on 10/13/2015 with infected L total knee.  Pharmacy has been consulted for vancomycin dosing.  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 Despite being on appropriate high dose IV antibiotics his knee has swollen and is draining frankly purulent material. Today he had excision arthroplasty L total knee and placement of abx spacer.   He was on Ancef 2 gm IV q8h PTA at home started 08/31/15. He is being followed by the ID clinic for MSSA prosthetic knee infection.  He got 1 gm IV vancomycin at 1614 and 3 gm placed into abx spacer in L knee.  D/w on call MD Dr. Lorin Mercy.  We have added back Ancef 2 gm q8h in addition to ordered vancomycin to cover MSSA.  Plan: Vancomycin 1000 IV every 12 hours.  Goal trough 15-20 mcg/mL.  Ancef 2 gm IV q8h. F/u w/ ID in am, may be able to stop vancomycin.   Height: 5' 10.5" (179.1 cm) Weight: 264 lb (119.75 kg) IBW/kg (Calculated) : 74.15  Temp (24hrs), Avg:98.2 F (36.8 C), Min:98 F (36.7 C), Max:98.3 F (36.8 C)   Recent Labs Lab 10/07/15 1105  WBC 6.0  CREATININE 1.10    Estimated Creatinine Clearance: 79.3 mL/min (by C-G formula based on Cr of 1.1).    Allergies  Allergen Reactions  . Nicoderm [Nicotine] Other (See Comments)    Heart rate dropped   . Adhesive [Tape] Itching and Rash    Please use "paper" tape    Thank you for allowing pharmacy to be a part of this patient's care.  Eudelia Bunch, Pharm.D. BP:[phone] 10/13/2015 10:40 PM

## 2015-10-13 NOTE — Patient Outreach (Signed)
[facility]) Care Management  10/13/2015  [name] 16-Nov-1945 [id number]  Mr. [name]. Otterbein is a 70 year old patient referred to [facility] after his recent hospital admission 08/28/15-08/31/15 for I&D and revision of the tibial component of infected left total knee arthroplasty. Mr. [name] had placement of antibiotic beads, wound VAC, and a PICC line for 6 weeks IV antibiotics.  Mr. [name] was newly diagnosed with DM Type II during this admission. He was seen by the dietician and diabetes coordinator during his hospitalization and has followed up with his primary care provider since for continued education and management of diabetes. He has been checking cbg's at home twice daily and readings, per his report, have been in the 130-140 range.   Mr. [name] was discharged to home with home health services including nursing and physical therapy. A rolling walker was provided to him prior to hospital discharge. He reports continuing to make progress with HHPT but it is currently on hold as his mother in law who lives with him and his wife passed away and he and his wife are in the midst of making arrangements.   Today, Mr. [name] is admitted for surgery - "removal of all hardware and components from his left knee at this point to hopefully try to better treat the infection."   Plan: I will notify Victoria Vera of Mr. Rubinstein admission and will follow his progress closely, planning to reach out to him within 72 hours of hospital discharge.   [facility]  [phone]

## 2015-10-13 NOTE — Brief Op Note (Signed)
10/13/2015  5:57 PM  PATIENT:  Steven Charles  70 y.o. male  PRE-OPERATIVE DIAGNOSIS:  Infected left total knee  POST-OPERATIVE DIAGNOSIS:  Infected left total knee  PROCEDURE:  Procedure(s): Excision arthroplasty left total knee, Placement of antibiotic spacer (Left)  SURGEON:  Surgeon(s) and Role:    * Mcarthur Rossetti, MD - Primary  PHYSICIAN ASSISTANT: Benita Stabile, PA-C  ANESTHESIA:   general  EBL:  Total I/O In: 1100 [I.V.:1100] Out: 300 [Blood:300]  COUNTS:  YES  TOURNIQUET:   Total Tourniquet Time Documented: Thigh (Left) - 98 minutes Total: Thigh (Left) - 98 minutes   DICTATION: .Other Dictation: Dictation Number [id number]  PLAN OF CARE: Admit to inpatient   PATIENT DISPOSITION:  PACU - hemodynamically stable.   Delay start of Pharmacological VTE agent (>24hrs) due to surgical blood loss or risk of bleeding: no
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
## 2015-10-13 NOTE — Anesthesia Preprocedure Evaluation (Signed)
Anesthesia Evaluation  Patient identified by MRN, date of birth, ID band Patient awake    Reviewed: Allergy & Precautions, NPO status , Patient's Chart, lab work & pertinent test results  Airway Mallampati: II       Dental  (+) Edentulous Upper, Partial Lower, Dental Advisory Given   Pulmonary former smoker,    breath sounds clear to auscultation       Cardiovascular hypertension,  Rhythm:Regular Rate:Normal     Neuro/Psych    GI/Hepatic   Endo/Other    Renal/GU      Musculoskeletal   Abdominal (+) + obese,   Peds  Hematology   Anesthesia Other Findings   Reproductive/Obstetrics                             Anesthesia Physical Anesthesia Plan  ASA: III  Anesthesia Plan: General   Post-op Pain Management:    Induction: Intravenous  Airway Management Planned: Oral ETT  Additional Equipment:   Intra-op Plan:   Post-operative Plan: Extubation in OR  Informed Consent: I have reviewed the patients History and Physical, chart, labs and discussed the procedure including the risks, benefits and alternatives for the proposed anesthesia with the patient or authorized representative who has indicated his/her understanding and acceptance.   Dental advisory given  Plan Discussed with: CRNA and Anesthesiologist  Anesthesia Plan Comments:         Anesthesia Quick Evaluation

## 2015-10-13 NOTE — Anesthesia Postprocedure Evaluation (Signed)
Anesthesia Post Note  Patient: Steven Charles  Procedure(s) Performed: Procedure(s) (LRB): Excision arthroplasty left total knee, Placement of antibiotic spacer (Left)  Patient location during evaluation: PACU Anesthesia Type: General Level of consciousness: awake and awake and alert Respiratory status: spontaneous breathing, nonlabored ventilation and respiratory function stable Cardiovascular status: blood pressure returned to baseline Anesthetic complications: no    Last Vitals:  Filed Vitals:   10/13/15 1813 10/13/15 1826  BP: 128/71 117/75  Pulse:  78  Temp: 36.7 C   Resp:  14    Last Pain:  Filed Vitals:   10/13/15 1832  PainSc: [place]

## 2015-10-13 NOTE — Anesthesia Procedure Notes (Signed)
Procedure Name: Intubation Date/Time: 10/13/2015 3:53 PM Performed by: Bethel Born Pre-anesthesia Checklist: Patient identified, Emergency Drugs available, Suction available, Patient being monitored and Timeout performed Patient Re-evaluated:Patient Re-evaluated prior to inductionOxygen Delivery Method: Circle system utilized Preoxygenation: Pre-oxygenation with 100% oxygen Intubation Type: IV induction Ventilation: Mask ventilation without difficulty and Oral airway inserted - appropriate to patient size Laryngoscope Size: Miller and 3 Grade View: Grade I Tube size: 7.5 mm Number of attempts: 1 Airway Equipment and Method: Stylet Placement Confirmation: ETT inserted through vocal cords under direct vision,  positive ETCO2 and breath sounds checked- equal and bilateral Secured at: 22 cm Tube secured with: Tape Dental Injury: Teeth and Oropharynx as per pre-operative assessment

## 2015-10-13 NOTE — Transfer of Care (Signed)
Immediate Anesthesia Transfer of Care Note  Patient: Steven Charles  Procedure(s) Performed: Procedure(s): Excision arthroplasty left total knee, Placement of antibiotic spacer (Left)  Patient Location: PACU  Anesthesia Type:General  Level of Consciousness: awake, alert  and oriented  Airway & Oxygen Therapy: Patient Spontanous Breathing and Patient connected to nasal cannula oxygen  Post-op Assessment: Report given to RN and Post -op Vital signs reviewed and stable  Post vital signs: Reviewed and stable  Last Vitals:  Filed Vitals:   10/13/15 1305  BP: 102/51  Pulse: 66  Temp: 36.8 C  Resp: 20    Last Pain:  Filed Vitals:   10/13/15 1333  PainSc: 4          Complications: No apparent anesthesia complications

## 2015-10-14 ENCOUNTER — Encounter (HOSPITAL_COMMUNITY): Payer: Self-pay | Admitting: *Deleted

## 2015-10-14 LAB — BASIC METABOLIC PANEL
Anion gap: 5 (ref 5–15)
BUN: 17 mg/dL (ref 6–20)
CALCIUM: 8.6 mg/dL — AB (ref 8.9–10.3)
CHLORIDE: 104 mmol/L (ref 101–111)
CO2: 25 mmol/L (ref 22–32)
CREATININE: 1.3 mg/dL — AB (ref 0.61–1.24)
GFR, EST NON AFRICAN AMERICAN: 53 mL/min — AB (ref >60)
Glucose, Bld: 125 mg/dL — ABNORMAL HIGH (ref 65–99)
Potassium: 4.5 mmol/L (ref 3.5–5.1)
SODIUM: 134 mmol/L — AB (ref 135–145)

## 2015-10-14 LAB — CBC
HEMATOCRIT: 24.1 % — AB (ref 39.0–52.0)
HEMOGLOBIN: 7.8 g/dL — AB (ref 13.0–17.0)
MCH: 28.6 pg (ref 26.0–34.0)
MCHC: 32.4 g/dL (ref 30.0–36.0)
MCV: 88.3 fL (ref 78.0–100.0)
Platelets: 130 10*3/uL — ABNORMAL LOW (ref 150–400)
RBC: 2.73 MIL/uL — AB (ref 4.22–5.81)
RDW: 14.4 % (ref 11.5–15.5)
WBC: 7.2 10*3/uL (ref 4.0–10.5)

## 2015-10-14 LAB — GLUCOSE, CAPILLARY
GLUCOSE-CAPILLARY: 122 mg/dL — AB (ref 65–99)
GLUCOSE-CAPILLARY: 148 mg/dL — AB (ref 65–99)

## 2015-10-14 LAB — HEMOGLOBIN AND HEMATOCRIT, BLOOD
HCT: 27.3 % — ABNORMAL LOW (ref 39.0–52.0)
HEMOGLOBIN: 8.9 g/dL — AB (ref 13.0–17.0)

## 2015-10-14 LAB — PREPARE RBC (CROSSMATCH)

## 2015-10-14 MED ORDER — FUROSEMIDE 10 MG/ML IJ SOLN
20.0000 mg | Freq: Once | INTRAMUSCULAR | Status: AC
  Administered 2015-10-14: 20 mg via INTRAVENOUS
  Filled 2015-10-14: qty 2

## 2015-10-14 MED ORDER — SODIUM CHLORIDE 0.9% FLUSH
10.0000 mL | INTRAVENOUS | Status: DC | PRN
  Administered 2015-10-14 – 2015-10-15 (×2): 10 mL
  Administered 2015-10-17: 30 mL
  Filled 2015-10-14 (×3): qty 40

## 2015-10-14 MED ORDER — SODIUM CHLORIDE 0.9 % IV SOLN
Freq: Once | INTRAVENOUS | Status: AC
  Administered 2015-10-14: 08:00:00 via INTRAVENOUS
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
## 2015-10-14 NOTE — Op Note (Deleted)
NAME:  DALER, SALERNO NO.:  [id number]  MEDICAL RECORD NO.:  [id number]  LOCATION:                               FACILITY:  [facility]  PHYSICIAN:  Lind Guest. 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  PROCEDURE: 1. Excision arthroplasty of left knee including removal of all     components. 2. Placement of antibiotic laden cement spacer.  SURGEON:  Lind Guest. Ninfa Linden, M.D.  ASSISTANT:  Erskine Emery, PA-C  ANESTHESIA:  General.  BLOOD LOSS:  Less than 200 mL.  TOURNIQUET TIME:  2 hours.  COMPLICATIONS:  None.  INDICATIONS:  Mr. Moriyama is a 70 year old gentleman who over 7 years ago to 10 years ago underwent a total knee arthroplasty on his left knee. This was a primary knee arthroplasty.  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  The knee replacement was successful and for several years, he had done well.  Earlier this year, he had about 2 months of worsening left knee pain and then presented to the emergency room with some fever and chills and what was felt to be potentially prepatellar bursa infection.  One of my partners took him to the operating room and performed an extensive irrigation and debridement of this area.  The regional surgeon then taken to the operating room a week or 2 later and performed a polyethylene liner exchange and placement of antibiotic beads.  He has been on IV Ancef now for between 5 and 6 weeks.  His infectious parameters have gotten lower but he has had wound  dehiscence with chronic drainage now from his wound for several weeks now.  It has been a serious type of drainage and is worrisome for chronic deep infection at this point.  My partners out of town.  I talked to them about the options.  Would likely need an excision arthroplasty.  He understands the difficult nature of this due to his inability to get his knee significantly flexed.  PROCEDURE DESCRIPTION:  After informed consent was obtained, appropriate left knee was marked.  He was brought to the operating room, placed supine on the operating table.  General anesthesia was then obtained. Nonsterile tourniquet was placed around his upper left thigh and his left leg was prepped and draped from the thigh down the toes with DuraPrep, sterile drapes and a sterile stockinette.  A time-out was called.  He was identified as correct patient and correct left knee.  We then had the tourniquet elevated to 300 mmHg pressure.  We then made a midline incision over the knee and ellipsed out.  His wound dehisced at the proximal aspect.  We found a tract of going all the way down to the joint itself.  The fluid was dark appearing.  We did obtain  cultures from this, even though he has been on antibiotics.  We then gave him a g of Ancef or a g of vancomycin.  After that we opened up the knee joints entirely, which was quite difficult during a significant scar tissue and just found a soupy fluid throughout the knee.  We then irrigated the knee with 3 L normal saline solution.  Given the nature of the fluid we see in his knee and the drain he was having, felt it was appropriate to remove all components.  We were able to chip away at the knee and removed the components.  The femoral component came of easily within the tibial component.  The tibial component appeared to be better seated. We removed all cement debris from the knee as well.  There was a fracture that was sustained at the medial femoral condyle.   He also has significant loss of the extensor mechanism of his knee.  I was able to remove the patellar button as well.  We examined the knee under fluoroscopy and there was 1 pin that is broken off inside the bone deep in the patella.  We then performed a synovectomy throughout the knee and used a curette to scrape around the bone and the joint capsule.  We then irrigated additional 3 L of normal saline solution to the knee.  We then mixed 4 batches of cement placing 3 g of vancomycin in the cement that also has gentamicin in it.  We were able to hand make a spacer to go in the middle of the knee.  Once this had hardened, we then closed the deep tissue with 2-0 Vicryl followed by 0 Vicryl in the next layer, 2-0 Vicryl in subcutaneous tissue, interrupted staples on the skin. Xeroform and well-padded sterile dressing was applied.  He was placed in a knee immobilizer.  Tourniquet was let down.  His toes were pink and nice.  He was awakened, extubated, and taken to recovery room in stable condition.  All final counts were correct.  There were no complications noted.  Of note, Erskine Emery, PA-C's assistance was essential throughout the whole case.     Lind Guest. Ninfa Linden, M.D.   ______________________________ Lind Guest. Ninfa Linden, M.D.    CYB/MEDQ  D:  10/13/2015  T:  10/14/2015  Job:  [id number]

## 2015-10-14 NOTE — Progress Notes (Signed)
Orthopedic Tech Progress Note Patient Details:  Steven Charles [birth year] [id number]  Patient ID: Steven Charles, male   DOB: 1945-11-06, 70 y.o.   MRN: [id number] Called in bio-tech brace order; spoke with Steven Charles, Steven Charles 10/14/2015, 9:28 AM

## 2015-10-14 NOTE — Telephone Encounter (Signed)
Rob would you mind checking in on Mr Wacek tomorrow and make sure he leaves on Ancef for 6-8 weeks (unless he grows a new organism)?

## 2015-10-14 NOTE — Progress Notes (Signed)
Subjective: 1 Day Post-Op Procedure(s) (LRB): Excision arthroplasty left total knee, Placement of antibiotic spacer (Left) Patient reports pain as moderate.  All hardware/implants now removed from knee and cement spacer in place.  Acute blood loss anemia from surgery.  Objective: Vital signs in last 24 hours: Temp:  [98 F (36.7 C)-98.9 F (37.2 C)] 98.9 F (37.2 C) (07/19 0425) Pulse Rate:  [66-88] 84 (07/19 0425) Resp:  [14-20] 18 (07/19 0425) BP: (94-135)/(48-99) 94/48 mmHg (07/19 0425) SpO2:  [97 %-100 %] 100 % (07/19 0425) Weight:  [119.75 kg (264 lb)] 119.75 kg (264 lb) (07/18 1305)  Intake/Output from previous day: 07/18 0701 - 07/19 0700 In: 1100 [I.V.:1100] Out: 500 [Urine:200; Blood:300] Intake/Output this shift:     Recent Labs  10/14/15 0603  HGB 7.8*    Recent Labs  10/14/15 0603  WBC 7.2  RBC 2.73*  HCT 24.1*  PLT 130*    Recent Labs  10/14/15 0603  NA 134*  K 4.5  CL 104  CO2 25  BUN 17  CREATININE 1.30*  GLUCOSE 125*  CALCIUM 8.6*   No results for input(s): LABPT, INR in the last 72 hours.  Sensation intact distally Intact pulses distally Dorsiflexion/Plantar flexion intact Incision: dressing C/D/I Compartment soft  Assessment/Plan: 1 Day Post-Op Procedure(s) (LRB): Excision arthroplasty left total knee, Placement of antibiotic spacer (Left) Up with therapy - non-weight bearing left leg. Will transfuse blood today. Continue IV antibiotics.  Steven Charles Y 10/14/2015, 7:46 AM
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
## 2015-10-14 NOTE — Progress Notes (Signed)
Nutrition Brief Note  Patient identified on the Malnutrition Screening Tool (MST) Report  Wt Readings from Last 15 Encounters:  10/13/15 264 lb (119.75 kg)  10/12/15 264 lb 8 oz (119.976 kg)  10/07/15 262 lb (118.842 kg)  10/02/15 263 lb (119.296 kg)  08/28/15 278 lb (126.1 kg)  08/22/15 278 lb (126.1 kg)  02/17/15 281 lb (127.461 kg)  12/04/14 282 lb (127.914 kg)  10/16/14 282 lb 2 oz (127.971 kg)  07/18/14 286 lb (129.729 kg)  02/13/14 290 lb 8 oz (131.77 kg)  01/02/14 289 lb 3.2 oz (131.18 kg)  08/13/13 290 lb 6 oz (131.713 kg)  04/26/13 285 lb (129.275 kg)  02/01/13 287 lb 6 oz (130.352 kg)    Body mass index is 37.33 kg/(m^2). Patient meets criteria for class II obesity based on current BMI. Pt reports weight loss due to medication.   Current diet order is regular, patient is consuming approximately 100% of meals at this time. Pt reports having a good appetite currently and PTA with no other difficulties. Labs and medications reviewed.   No nutrition interventions warranted at this time. If nutrition issues arise, please consult RD.   Corrin Parker, MS, RD, LDN Pager # [phone] After hours/ weekend pager # [phone]

## 2015-10-14 NOTE — Op Note (Signed)
NAME:  Steven Charles, Steven Charles NO.:  [id number]  MEDICAL RECORD NO.:  [id number]  LOCATION:                               FACILITY:  [facility]  PHYSICIAN:  Lind Guest. 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  PROCEDURE: 1. Excision arthroplasty of left knee including removal of all     components. 2. Placement of antibiotic laden cement spacer.  SURGEON:  Lind Guest. Ninfa Linden, M.D.  ASSISTANT:  Erskine Emery, PA-C  ANESTHESIA:  General.  BLOOD LOSS:  Less than 200 mL.  TOURNIQUET TIME:  2 hours.  COMPLICATIONS:  None.  INDICATIONS:  Mr. Aronow is a 70 year old gentleman who over 7 years ago to 10 years ago underwent a total knee arthroplasty on his left knee. This was a primary knee arthroplasty.  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  The knee replacement was successful and for several years, he had done well.  Earlier this year, he had about 2 months of worsening left knee pain and then presented to the emergency room with some fever and chills and what was felt to be potentially prepatellar bursa infection.  One of my partners took him to the operating room and performed an extensive irrigation and debridement of this area.  The regional surgeon then taken to the operating room a week or 2 later and performed a polyethylene liner exchange and placement of antibiotic beads.  He has been on IV Ancef now for between 5 and 6 weeks.  His infectious parameters have gotten lower but he has had wound  dehiscence with chronic drainage now from his wound for several weeks now.  It has been a serious type of drainage and is worrisome for chronic deep infection at this point.  My partners out of town.  I talked to them about the options.  Would likely need an excision arthroplasty.  He understands the difficult nature of this due to his inability to get his knee significantly flexed.  PROCEDURE DESCRIPTION:  After informed consent was obtained, appropriate left knee was marked.  He was brought to the operating room, placed supine on the operating table.  General anesthesia was then obtained. Nonsterile tourniquet was placed around his upper left thigh and his left leg was prepped and draped from the thigh down the toes with DuraPrep, sterile drapes and a sterile stockinette.  A time-out was called.  He was identified as correct patient and correct left knee.  We then had the tourniquet elevated to 300 mmHg pressure.  We then made a midline incision over the knee and ellipsed out.  His wound dehisced at the proximal aspect.  We found a tract of going all the way down to the joint itself.  The fluid was dark appearing.  We did obtain  cultures from this, even though he has been on antibiotics.  We then gave him a g of Ancef or a g of vancomycin.  After that we opened up the knee joints entirely, which was quite difficult during a significant scar tissue and just found a soupy fluid throughout the knee.  We then irrigated the knee with 3 L normal saline solution.  Given the nature of the fluid we see in his knee and the drain he was having, felt it was appropriate to remove all components.  We were able to chip away at the knee and removed the components.  The femoral component came of easily within the tibial component.  The tibial component appeared to be better seated. We removed all cement debris from the knee as well.  There was a fracture that was sustained at the medial femoral condyle.   He also has significant loss of the extensor mechanism of his knee.  I was able to remove the patellar button as well.  We examined the knee under fluoroscopy and there was 1 pin that is broken off inside the bone deep in the patella.  We then performed a synovectomy throughout the knee and used a curette to scrape around the bone and the joint capsule.  We then irrigated additional 3 L of normal saline solution to the knee.  We then mixed 4 batches of cement placing 3 g of vancomycin in the cement that also has gentamicin in it.  We were able to hand make a spacer to go in the middle of the knee.  Once this had hardened, we then closed the deep tissue with 2-0 Vicryl followed by 0 Vicryl in the next layer, 2-0 Vicryl in subcutaneous tissue, interrupted staples on the skin. Xeroform and well-padded sterile dressing was applied.  He was placed in a knee immobilizer.  Tourniquet was let down.  His toes were pink and nice.  He was awakened, extubated, and taken to recovery room in stable condition.  All final counts were correct.  There were no complications noted.  Of note, Erskine Emery, PA-C's assistance was essential throughout the whole case.     Lind Guest. Ninfa Linden, M.D.   ______________________________ Lind Guest. Ninfa Linden, M.D.    CYB/MEDQ  D:  10/13/2015  T:  10/14/2015  Job:  [id number]

## 2015-10-14 NOTE — Care Management Important Message (Signed)
Important Message  Patient Details  Name: Steven Charles MRN: [id number] Date of Birth: [birth year]   Medicare Important Message Given:  Yes    Loann Quill 10/14/2015, 10:09 AM

## 2015-10-15 DIAGNOSIS — Z9889 Other specified postprocedural states: Secondary | ICD-10-CM

## 2015-10-15 DIAGNOSIS — Y792 Prosthetic and other implants, materials and accessory orthopedic devices associated with adverse incidents: Secondary | ICD-10-CM

## 2015-10-15 DIAGNOSIS — T8454XA Infection and inflammatory reaction due to internal left knee prosthesis, initial encounter: Principal | ICD-10-CM

## 2015-10-15 DIAGNOSIS — N289 Disorder of kidney and ureter, unspecified: Secondary | ICD-10-CM

## 2015-10-15 DIAGNOSIS — B957 Other staphylococcus as the cause of diseases classified elsewhere: Secondary | ICD-10-CM

## 2015-10-15 LAB — TYPE AND SCREEN
ABO/RH(D): A NEG
Antibody Screen: NEGATIVE
Unit division: 0
Unit division: 0

## 2015-10-15 LAB — CBC
HCT: 26.3 % — ABNORMAL LOW (ref 39.0–52.0)
Hemoglobin: 8.7 g/dL — ABNORMAL LOW (ref 13.0–17.0)
MCH: 28.6 pg (ref 26.0–34.0)
MCHC: 33.1 g/dL (ref 30.0–36.0)
MCV: 86.5 fL (ref 78.0–100.0)
Platelets: 115 10*3/uL — ABNORMAL LOW (ref 150–400)
RBC: 3.04 MIL/uL — ABNORMAL LOW (ref 4.22–5.81)
RDW: 14.5 % (ref 11.5–15.5)
WBC: 7 10*3/uL (ref 4.0–10.5)

## 2015-10-15 LAB — GLUCOSE, CAPILLARY
GLUCOSE-CAPILLARY: 139 mg/dL — AB (ref 65–99)
Glucose-Capillary: 138 mg/dL — ABNORMAL HIGH (ref 65–99)
Glucose-Capillary: 149 mg/dL — ABNORMAL HIGH (ref 65–99)
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
## 2015-10-15 NOTE — Progress Notes (Signed)
Physical Therapy Treatment Patient Details Name: Steven Charles MRN: [id number] DOB: 1945/11/29 Today's Date: 10/15/2015    History of Present Illness 70 y/o s/p removal of L knee prosthesis with placement of antibiotic laden spacer (10/13/15) PMH includes I & D (08/22/15), placement of antibiotic beads, HTN, CAD, CHF, MI, neuropathy    PT Comments    Patient is gradually progressing toward mobility goals. Mod A +2 overall this session. Current plan remains appropriate.  Follow Up Recommendations  SNF     Equipment Recommendations  None recommended by PT    Recommendations for Other Services       Precautions / Restrictions Precautions Precautions: Fall Required Braces or Orthoses: Other Brace/Splint Other Brace/Splint: [place]so[place] brace on at all times Restrictions Weight Bearing Restrictions: Yes LLE Weight Bearing: Non weight bearing    Mobility  Bed Mobility Overal bed mobility: Needs Assistance Bed Mobility: Supine to Sit     Supine to sit: Mod assist;HOB elevated;+2 for physical assistance     General bed mobility comments: assist to advance L LE to EOB and lower from bed as well as scoot L hip forward with use of bed pad; +2 for safety as therapist held [place] assisted with trunk elevation into sitting; pt needed little A for truncal support  Transfers Overall transfer level: Needs assistance Equipment used: Rolling walker (2 wheeled) Transfers: Stand Pivot Transfers   Stand pivot transfers: Mod assist;+2 safety/equipment       General transfer comment: cues for safe hand placement and technique while maintaining NWB L LE; assist to manage RW and weight shifting   Ambulation/Gait                 Stairs            Wheelchair Mobility    Modified Rankin (Stroke Patients Only)       Balance     Sitting balance-Leahy Scale: Good       Standing balance-Leahy Scale: Poor                      Cognition Arousal/Alertness:  Awake/alert Behavior During Therapy: WFL for tasks assessed/performed Overall Cognitive Status: Within Functional Limits for tasks assessed                      Exercises      General Comments        Pertinent Vitals/Pain Pain Assessment: 0-10 Pain Score: 7  Pain Location: L LE Pain Descriptors / Indicators: Guarding;Sore Pain Intervention(s): Limited activity within patient's tolerance;Monitored during session;Premedicated before session;Repositioned    Home Living                      Prior Function            PT Goals (current goals can now be found in the care plan section) Acute Rehab PT Goals Patient Stated Goal: decreased pain Progress towards PT goals: Progressing toward goals    Frequency  Min 5X/week    PT Plan Current plan remains appropriate    Co-evaluation             End of Session Equipment Utilized During Treatment: Gait belt Activity Tolerance: Patient limited by pain Patient left: in chair;with call bell/phone within reach;with family/visitor present;with nursing/sitter in room     Time: 1455-1526 PT Time Calculation (min) (ACUTE ONLY): 31 min  Charges:  $Therapeutic Activity: 23-37 mins  G Codes:      [place] April, PTA Pager: [phone]   10/15/2015, 5:20 PM

## 2015-10-15 NOTE — Clinical Social Work Note (Signed)
Clinical Social Work Assessment  Patient Details  Name: Steven Charles MRN: [id number] Date of Birth: 08/26/1945  Date of referral:  10/15/15               Reason for consult:  Facility Placement, Discharge Planning                Permission sought to share information with:  Chartered certified accountant granted to share information::  Yes, Verbal Permission Granted  Name::        Agency::   [facility] SNFs)  Relationship::     Contact Information:     Housing/Transportation Living arrangements for the past 2 months:  [place] of Information:  Patient Patient Interpreter Needed:  None Criminal Activity/Legal Involvement Pertinent to Current Situation/Hospitalization:  No - Comment as needed Significant Relationships:  Spouse Lives with:  Spouse Do you feel safe going back to the place where you live?    Need for family participation in patient care:  No (Coment)  Care giving concerns:  Patient states he has had 4 surgeries on this knee just this year and is concerned that this surgery may not work as well.  Patient states his worst fear is that he loses his leg.  CSW offered support.   Social Worker assessment / plan:  CSW met with patient at bedside to review disposition.  Patient states he is from home with spouse who cannot provide care at time of discharge.  Patient states his wife cannot assist him in his transfers or his trips to the restroom.  Patient states that he [place] [address] and would like to check into [facility] at time of discharge.  Patient states he cannot straighten his knee out and will need EMS transportation at time of discharge.  Employment status:  Retired Science writer) PT Recommendations:  [place] / Referral to community resources:  [place]  Patient/Family's Response to care:  Patient is agreeable to  SNF  Patient/Family's Understanding of and Emotional Response to Diagnosis, Current Treatment, and Prognosis:  Patient is apprehensive regarding the success of this surgery.  Of note, this is his 4th surgery this year on this knee.  Patient states the MD advised that his bones may not be strong enough to undergo another surgery.  Patient is concerned he will eventually lose his leg.  CSW provided support.  Emotional Assessment Appearance:  Appears older than stated age Attitude/Demeanor/Rapport:  Apprehensive Affect (typically observed):  Accepting, Adaptable Orientation:  Oriented to Self, Oriented to Place, Oriented to  Time, Oriented to Situation Alcohol / Substance use:    Psych involvement (Current and /or in the community):  No (Comment)  Discharge Needs  Concerns to be addressed:  No discharge needs identified Readmission within the last 30 days:  No Current discharge risk:    Barriers to Discharge:  Continued Medical Work up, [employer], [place], LCSW 10/15/2015, 11:19 AM
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
## 2015-10-15 NOTE — Clinical Social Work Placement (Signed)
   CLINICAL SOCIAL WORK PLACEMENT  NOTE  Date:  10/15/2015  Patient Details  Name: Steven Charles MRN: [id number] Date of Birth: [birth year]  Clinical Social Work is seeking post-discharge placement for this patient at the [place] level of care (*CSW will initial, date and re-position this form in  chart as items are completed):  Yes   Patient/family provided with [place] Work Department's list of facilities offering this level of care within the geographic area requested by the patient (or if unable, by the patient's family).  Yes   Patient/family informed of their freedom to choose among providers that offer the needed level of care, that participate in Medicare, Medicaid or managed care program needed by the patient, have an available bed and are willing to accept the patient.  Yes   Patient/family informed of [facility]'s ownership interest in [facility] and [facility], as well as of the fact that they are under no obligation to receive care at these facilities.  PASRR submitted to EDS on 10/15/15     PASRR number received on 10/15/15     Existing PASRR number confirmed on       FL2 transmitted to all facilities in geographic area requested by pt/family on 10/15/15     FL2 transmitted to all facilities within larger geographic area on       Patient informed that his/her managed care company has contracts with or will negotiate with certain facilities, including the following:        Yes   Patient/family informed of bed offers received.  Patient chooses bed at       Physician recommends and patient chooses bed at      Patient to be transferred to   on  .  Patient to be transferred to facility by       Patient family notified on   of transfer.  Name of family member notified:        PHYSICIAN Please sign FL2     Additional Comment:    _______________________________________________ Dulcy Fanny, LCSW 10/15/2015,  11:22 AM

## 2015-10-15 NOTE — Progress Notes (Signed)
Subjective: 2 Days Post-Op Procedure(s) (LRB): Excision arthroplasty left total knee, Placement of antibiotic spacer (Left) Patient reports pain as moderate to severe.  Feels better s/p 2 units PRBC'S yesterday. ID to see patient today per Dr. Lucianne Lei Dam's note.  Objective: Vital signs in last 24 hours: Temp:  [98.1 F (36.7 C)-99.2 F (37.3 C)] 98.3 F (36.8 C) (07/20 0033) Pulse Rate:  [67-75] 74 (07/20 0033) Resp:  [17-18] 18 (07/20 0033) BP: (95-121)/(37-74) 106/51 mmHg (07/20 0800) SpO2:  [95 %-100 %] 95 % (07/20 0033)  Intake/Output from previous day: 07/19 0701 - 07/20 0700 In: 1760 [P.O.:840; I.V.:250; Blood:670] Out: 1450 [Urine:1450] Intake/Output this shift:     Recent Labs  10/14/15 0603 10/14/15 2206 10/15/15 0445  HGB 7.8* 8.9* 8.7*    Recent Labs  10/14/15 0603 10/14/15 2206 10/15/15 0445  WBC 7.2  --  7.0  RBC 2.73*  --  3.04*  HCT 24.1* 27.3* 26.3*  PLT 130*  --  115*    Recent Labs  10/14/15 0603  NA 134*  K 4.5  CL 104  CO2 25  BUN 17  CREATININE 1.30*  GLUCOSE 125*  CALCIUM 8.6*   No results for input(s): LABPT, INR in the last 72 hours.  Left leg: Sensation intact distally Incision: moderate drainage Compartment soft  Assessment/Plan: 2 Days Post-Op Procedure(s) (LRB): Excision arthroplasty left total knee, Placement of antibiotic spacer (Left) Dressing changed  Monitor Hgb and patient for symptoms of anemia No organisms to date on culture . ID to see patient today .  Steven Charles 10/15/2015, 9:22 AM

## 2015-10-16 LAB — GLUCOSE, CAPILLARY
GLUCOSE-CAPILLARY: 107 mg/dL — AB (ref 65–99)
GLUCOSE-CAPILLARY: 127 mg/dL — AB (ref 65–99)
GLUCOSE-CAPILLARY: 135 mg/dL — AB (ref 65–99)
Glucose-Capillary: 122 mg/dL — ABNORMAL HIGH (ref 65–99)

## 2015-10-16 NOTE — Progress Notes (Signed)
[facility]  Patient Status:  Active pt with [facility] prior to this readmission  [facility] is providing the following services: HHRN and PT. Pt completed a 6 week course of IV Ancef 2 Grams IV Q 8 hours at home on 10/12/15. [facility] hospital team will follow [name] for transition home with [facility] services as ordered/needed.  If pt discharges to SNF for rehab [facility] SNF liaison will follow to support any HH needs up DC from SNF to home.   If patient discharges after hours, please call [phone].   [name] 10/16/2015, 1:46 PM

## 2015-10-16 NOTE — Consult Note (Signed)
[facility] CM Inpatient Consult   10/16/2015  Steven Charles 12/12/1945 [id number]  Was notified of patient's admission by RN Community Care Manager earlier this week.  Patient is currently active with [facility] Care Management for chronic disease management services. Per MD note complicated history concerning a 70 yo patient who had a left total knee replacement greater than 7 years ago. Eventually was taken to the OR by Dr. Dean in May for an irrigation and debridement of what was felt at the time a pre-patella bursal infection (history of previous left patella surgery prior to his knee replacement with retained patella hardware). In early June of this year the patient had and an open arthrotomy, exchange of poly-liner, and placement of antibiotic beads was performed. He has been on IV antibiotics thru a PICC line (Ancef) for several weeks to treat a sensitive staph infection. He was had continued drainage and now wound dehiscence. Patient underwent a removal of all hardware and components from his left knee. Met with the patient and his family at the bedside.  Brochure and contact information was given and accepted ongoing services for post hospital and post rehab follow up.     Patient has been engaged by a RN Community Care Coordinator.  Our community based plan of care has focused on disease management and community resource support.  Patient will receive a post discharge transition of care call at his rehab facility and will be evaluated for monthly home visits for assessments and disease process education.  Made Inpatient Case Manager aware that [facility] Care Management following. Of note, [facility] services does not replace or interfere with any services that are needed or arranged by inpatient case management or social work.  For additional questions or referrals please contact:  Victoria Brewer, RN BSN CCM [facility] Hospital Liaison  [phone] business mobile phone Toll free office  [phone]   

## 2015-10-16 NOTE — Care Management Important Message (Signed)
Important Message  Patient Details  Name: Steven Charles MRN: [id number] Date of Birth: 07/28/1945   Medicare Important Message Given:  Yes    Loann Quill 10/16/2015, 8:27 AM

## 2015-10-16 NOTE — Progress Notes (Signed)
Subjective: 3 Days Post-Op Procedure(s) (LRB): Excision arthroplasty left total knee, Placement of antibiotic spacer (Left) Patient reports pain as moderate.  Appreciate ID input.  Therapy has recommended skilled nursing.  Objective: Vital signs in last 24 hours: Temp:  [98 F (36.7 C)-98.5 F (36.9 C)] 98 F (36.7 C) (07/21 0510) Pulse Rate:  [69-77] 69 (07/21 0510) Resp:  [18] 18 (07/21 0510) BP: (106-118)/(43-51) 118/49 mmHg (07/21 0510) SpO2:  [94 %-100 %] 100 % (07/21 0510)  Intake/Output from previous day: 07/20 0701 - 07/21 0700 In: 240 [P.O.:240] Out: 600 [Urine:600] Intake/Output this shift: Total I/O In: -  Out: 600 [Urine:600]   Recent Labs  10/14/15 0603 10/14/15 2206 10/15/15 0445  HGB 7.8* 8.9* 8.7*    Recent Labs  10/14/15 0603 10/14/15 2206 10/15/15 0445  WBC 7.2  --  7.0  RBC 2.73*  --  3.04*  HCT 24.1* 27.3* 26.3*  PLT 130*  --  115*    Recent Labs  10/14/15 0603  NA 134*  K 4.5  CL 104  CO2 25  BUN 17  CREATININE 1.30*  GLUCOSE 125*  CALCIUM 8.6*   No results for input(s): LABPT, INR in the last 72 hours.  Sensation intact distally Intact pulses distally Dorsiflexion/Plantar flexion intact Incision: dressing C/D/I Compartment soft  Assessment/Plan: 3 Days Post-Op Procedure(s) (LRB): Excision arthroplasty left total knee, Placement of antibiotic spacer (Left) Up with therapy Discharge to SNF Monday  Analynn Daum Y 10/16/2015, 6:33 AM

## 2015-10-16 NOTE — Progress Notes (Signed)
Physical Therapy Treatment Patient Details Name: Steven Charles MRN: [id number] DOB: 24-Oct-1945 Today's Date: 10/16/2015    History of Present Illness 70 y/o s/p removal of L knee prosthesis with placement of antibiotic laden spacer (10/13/15) PMH includes I & D (08/22/15), placement of antibiotic beads, HTN, CAD, CHF, MI, neuropathy    PT Comments    Patient continues to progress gradually toward mobility goals. Tolerated ambulating short distance in room this session. Continue to progress as tolerated with anticipated d/c to SNF for further skilled PT services.     Follow Up Recommendations  SNF     Equipment Recommendations  None recommended by PT    Recommendations for Other Services       Precautions / Restrictions Precautions Precautions: Fall Required Braces or Orthoses: Other Brace/Splint Other Brace/Splint: Bledsoe brace on at all times Restrictions Weight Bearing Restrictions: Yes LLE Weight Bearing: Non weight bearing    Mobility  Bed Mobility Overal bed mobility: Needs Assistance Bed Mobility: Supine to Sit     Supine to sit: Mod assist;HOB elevated     General bed mobility comments: assist to bring L LE to EOB and lower from EOB and scoot L hip to EOB with use of bed pad; pt able to assist to more today with advancing L LE and elevating trunk into sitting than previous session  Transfers Overall transfer level: Needs assistance Equipment used: Rolling walker (2 wheeled) Transfers: Sit to/from Stand Sit to Stand: Min assist;From elevated surface         General transfer comment: assist to maintain L LE NWB and stabilization of RW; cues for hand placement; pt able to power up into standing without physical assist  Ambulation/Gait Ambulation/Gait assistance: Min assist;+2 safety/equipment Ambulation Distance (Feet): 7 Feet Assistive device: Rolling walker (2 wheeled) Gait Pattern/deviations: Step-to pattern     General Gait Details: cues for  posture, position of RW, and WB status   Stairs            Wheelchair Mobility    Modified Rankin (Stroke Patients Only)       Balance     Sitting balance-Leahy Scale: Good       Standing balance-Leahy Scale: Poor                      Cognition Arousal/Alertness: Awake/alert Behavior During Therapy: WFL for tasks assessed/performed Overall Cognitive Status: Within Functional Limits for tasks assessed                      Exercises      General Comments        Pertinent Vitals/Pain Pain Assessment: Faces Faces Pain Scale: Hurts little more Pain Location: L LE Pain Descriptors / Indicators: Grimacing;Guarding;Sore Pain Intervention(s): Limited activity within patient's tolerance;Monitored during session;Premedicated before session;Repositioned    Home Living                      Prior Function            PT Goals (current goals can now be found in the care plan section) Acute Rehab PT Goals Patient Stated Goal: move around without pain Progress towards PT goals: Progressing toward goals    Frequency  Min 5X/week    PT Plan Current plan remains appropriate    Co-evaluation             End of Session Equipment Utilized During Treatment: Gait belt Activity Tolerance: Patient  tolerated treatment well Patient left: in chair;with call bell/phone within reach;with family/visitor present;with nursing/sitter in room     Time: 0935-1003 PT Time Calculation (min) (ACUTE ONLY): 28 min  Charges:  $Gait Training: 8-22 mins $Therapeutic Activity: 8-22 mins                    G Codes:      Salina April, PTA Pager: [phone]   10/16/2015, 10:19 AM

## 2015-10-17 LAB — BODY FLUID CULTURE: CULTURE: NO GROWTH

## 2015-10-17 LAB — GLUCOSE, CAPILLARY
Glucose-Capillary: 117 mg/dL — ABNORMAL HIGH (ref 65–99)
Glucose-Capillary: 143 mg/dL — ABNORMAL HIGH (ref 65–99)

## 2015-10-17 MED ORDER — POLYETHYLENE GLYCOL 3350 17 G PO PACK
17.0000 g | PACK | Freq: Every day | ORAL | Status: DC
  Administered 2015-10-17 – 2015-10-19 (×3): 17 g via ORAL
  Filled 2015-10-17 (×2): qty 1

## 2015-10-17 MED ORDER — ALTEPLASE 2 MG IJ SOLR
2.0000 mg | Freq: Once | INTRAMUSCULAR | Status: AC
  Administered 2015-10-17: 2 mg

## 2015-10-17 MED ORDER — DOCUSATE SODIUM 100 MG PO CAPS
200.0000 mg | ORAL_CAPSULE | Freq: Every day | ORAL | Status: DC
  Administered 2015-10-17 – 2015-10-19 (×3): 200 mg via ORAL
  Filled 2015-10-17 (×2): qty 2

## 2015-10-17 NOTE — Progress Notes (Signed)
Patient ID: Steven Charles, male   DOB: 1945/08/25, 70 y.o.   MRN: [id number] No acute changes.  Feels slightly better.  Left knee stable post-excision arthroplasty and placement of antibiotic spacer.  Plan is for SNF early next week and continuing IV Ancef until the end of August.

## 2015-10-17 NOTE — Progress Notes (Signed)
Physical Therapy Treatment Patient Details Name: Steven Charles MRN: [id number] DOB: 1945-10-14 Today's Date: 10/17/2015    History of Present Illness 70 y/o s/p removal of L knee prosthesis with placement of antibiotic laden spacer (10/13/15) PMH includes I & D (08/22/15), placement of antibiotic beads, HTN, CAD, CHF, MI, neuropathy    PT Comments    Patient tolerated increased ambulation this session and is abel to assist more with transitional movements each day. Current plan remains appropriate.   Follow Up Recommendations  SNF     Equipment Recommendations  None recommended by PT    Recommendations for Other Services       Precautions / Restrictions Precautions Precautions: Fall Required Braces or Orthoses: Other Brace/Splint Other Brace/Splint: Bledsoe brace on at all times Restrictions Weight Bearing Restrictions: Yes LLE Weight Bearing: Non weight bearing    Mobility  Bed Mobility Overal bed mobility: Needs Assistance Bed Mobility: Supine to Sit     Supine to sit: Mod assist;HOB elevated     General bed mobility comments: assist to advance L LE from EOB and scoot L hip to EOB with use of bed pad; pt able to assist a little more each session   Transfers Overall transfer level: Needs assistance Equipment used: Rolling walker (2 wheeled) Transfers: Sit to/from Stand Sit to Stand: Min assist;From elevated surface         General transfer comment: assist to power up into standing and maintain balance during transition of hand placement; pt maintained NWB status throughout  Ambulation/Gait Ambulation/Gait assistance: Min assist Ambulation Distance (Feet): 12 Feet Assistive device: Rolling walker (2 wheeled) Gait Pattern/deviations: Step-to pattern     General Gait Details: cues for posture, position of RW, and safety; maintained NWB throughout   Stairs            Wheelchair Mobility    Modified Rankin (Stroke Patients Only)       Balance      Sitting balance-Leahy Scale: Good       Standing balance-Leahy Scale: Poor                      Cognition Arousal/Alertness: Awake/alert Behavior During Therapy: WFL for tasks assessed/performed Overall Cognitive Status: Within Functional Limits for tasks assessed                      Exercises      General Comments        Pertinent Vitals/Pain Pain Assessment: Faces Faces Pain Scale: Hurts even more Pain Location: L LE with transitional movements Pain Descriptors / Indicators: Grimacing;Guarding;Aching;Sore Pain Intervention(s): Limited activity within patient's tolerance;Monitored during session;Premedicated before session;Repositioned    Home Living                      Prior Function            PT Goals (current goals can now be found in the care plan section) Acute Rehab PT Goals Patient Stated Goal: no pain Progress towards PT goals: Progressing toward goals    Frequency  Min 5X/week    PT Plan Current plan remains appropriate    Co-evaluation             End of Session Equipment Utilized During Treatment: Gait belt Activity Tolerance: Patient tolerated treatment well Patient left: in chair;with call bell/phone within reach     Time: [id number] PT Time Calculation (min) (ACUTE ONLY): 33 min  Charges:  $  Gait Training: 8-22 mins $Therapeutic Activity: 8-22 mins                    G Codes:      Salina April, PTA Pager: [phone]   10/17/2015, 10:16 AM

## 2015-10-18 LAB — GLUCOSE, CAPILLARY
Glucose-Capillary: 116 mg/dL — ABNORMAL HIGH (ref 65–99)
Glucose-Capillary: 125 mg/dL — ABNORMAL HIGH (ref 65–99)

## 2015-10-18 MED ORDER — SODIUM CHLORIDE 0.9% FLUSH: 10.0000 mL | Freq: Two times a day (BID) | INTRAVENOUS | Status: DC

## 2015-10-18 MED ORDER — SODIUM CHLORIDE 0.9% FLUSH: 10.0000 mL | INTRAVENOUS | Status: DC | PRN

## 2015-10-18 NOTE — Progress Notes (Signed)
Physical Therapy Treatment Patient Details Name: Steven Charles MRN: [id number] DOB: 1945/08/09 Today's Date: 10/18/2015    History of Present Illness 70 y/o s/p removal of L knee prosthesis with placement of antibiotic laden spacer (10/13/15) PMH includes I & D (08/22/15), placement of antibiotic beads, HTN, CAD, CHF, MI, neuropathy    PT Comments    Pt's transfers and general mobility limited by current WB status and pain during mobility.  Patient may benefit from further skilled PT to improve mobility, reduce fall risk and incr functional independence.   Follow Up Recommendations  SNF     Equipment Recommendations  None recommended by PT    Recommendations for Other Services       Precautions / Restrictions Precautions Precautions: Fall Required Braces or Orthoses: Other Brace/Splint Other Brace/Splint: Bledsoe brace on at all times (locked at 0 degrees) Restrictions Weight Bearing Restrictions: Yes LLE Weight Bearing: Non weight bearing    Mobility  Bed Mobility Overal bed mobility: Needs Assistance Bed Mobility: Supine to Sit       Sit to supine: Mod assist;+2 for physical assistance   General bed mobility comments: use of Rt UE to pull on therapist as therapist supported left leg  Transfers Overall transfer level: Needs assistance Equipment used: Rolling walker (2 wheeled) Transfers: Sit to/from [employer] Transfers Sit to Stand: Min assist;+2 safety/equipment Stand pivot transfers: Mod assist;+2 safety/equipment       General transfer comment: able to maintain NWB status Lt LE during transfer, mod cues for hand and left leg placement during transfers  Ambulation/Gait                 Stairs            Wheelchair Mobility    Modified Rankin (Stroke Patients Only)       Balance Overall balance assessment: Needs assistance Sitting-balance support: Bilateral upper extremity supported;No upper extremity supported Sitting  balance-Leahy Scale: Good     Standing balance support: Bilateral upper extremity supported Standing balance-Leahy Scale: Poor                      Cognition Arousal/Alertness: Awake/alert Behavior During Therapy: WFL for tasks assessed/performed Overall Cognitive Status: Within Functional Limits for tasks assessed                      Exercises General Exercises - Lower Extremity Ankle Circles/Pumps: AROM;Both;10 reps;Supine Quad Sets: AROM;Both;10 reps;Supine    General Comments General comments (skin integrity, edema, etc.): Bledsoe brace on throughout session      Pertinent Vitals/Pain Pain Assessment: 0-10 Pain Score: 3  Pain Location: left knee and hip Pain Descriptors / Indicators: Aching;Sore Pain Intervention(s): Limited activity within patient's tolerance;Monitored during session;Repositioned    Home Living                      Prior Function            PT Goals (current goals can now be found in the care plan section) Acute Rehab PT Goals Patient Stated Goal: manage pain PT Goal Formulation: With patient Time For Goal Achievement: 10/14/15 Potential to Achieve Goals: Good Progress towards PT goals: Progressing toward goals    Frequency  Min 5X/week    PT Plan Current plan remains appropriate    Co-evaluation             End of Session   Activity Tolerance: Patient tolerated treatment well  Patient left: in chair;with call bell/phone within reach;with family/visitor present (pt's dau present throughout session)     Time: [id number] PT Time Calculation (min) (ACUTE ONLY): 25 min  Charges:  $Therapeutic Activity: 23-37 mins                    G CodesMalka So, PT [phone]  Mairyn Lenahan 10/18/2015, 4:09 PM

## 2015-10-18 NOTE — Progress Notes (Signed)
Subjective: Pt stable - pain ok   Objective: Vital signs in last 24 hours: Temp:  [98.1 F (36.7 C)-99.2 F (37.3 C)] 99.2 F (37.3 C) (07/23 0455) Pulse Rate:  [63-68] 68 (07/23 0455) Resp:  [18] 18 (07/23 0455) BP: (96-108)/(37-69) 108/69 (07/23 0455) SpO2:  [97 %-100 %] 97 % (07/23 0455)  Intake/Output from previous day: 07/22 0701 - 07/23 0700 In: 240 [P.O.:240] Out: 1100 [Urine:1100] Intake/Output this shift: No intake/output data recorded.  Exam:  No cellulitis present  Labs: No results for input(s): HGB in the last 72 hours. No results for input(s): WBC, RBC, HCT, PLT in the last 72 hours. No results for input(s): NA, K, CL, CO2, BUN, CREATININE, GLUCOSE, CALCIUM in the last 72 hours. No results for input(s): LABPT, INR in the last 72 hours.  Assessment/Plan: snf pending - will be long road for [place] 10/18/2015, 9:23 AM

## 2015-10-19 ENCOUNTER — Telehealth: Payer: Self-pay | Admitting: *Deleted

## 2015-10-19 ENCOUNTER — Other Ambulatory Visit: Payer: Self-pay | Admitting: *Deleted

## 2015-10-19 DIAGNOSIS — Z96659 Presence of unspecified artificial knee joint: Secondary | ICD-10-CM

## 2015-10-19 DIAGNOSIS — M1993 Secondary osteoarthritis, unspecified site: Secondary | ICD-10-CM | POA: Diagnosis not present

## 2015-10-19 DIAGNOSIS — M25569 Pain in unspecified knee: Secondary | ICD-10-CM | POA: Diagnosis not present

## 2015-10-19 DIAGNOSIS — T84018D Broken internal joint prosthesis, other site, subsequent encounter: Secondary | ICD-10-CM

## 2015-10-19 DIAGNOSIS — B999 Unspecified infectious disease: Secondary | ICD-10-CM | POA: Diagnosis not present

## 2015-10-19 DIAGNOSIS — T8450XA Infection and inflammatory reaction due to unspecified internal joint prosthesis, initial encounter: Secondary | ICD-10-CM | POA: Diagnosis not present

## 2015-10-19 LAB — ANAEROBIC CULTURE

## 2015-10-19 LAB — GLUCOSE, CAPILLARY
GLUCOSE-CAPILLARY: 112 mg/dL — AB (ref 65–99)
GLUCOSE-CAPILLARY: 115 mg/dL — AB (ref 65–99)

## 2015-10-19 MED ORDER — CEFAZOLIN SODIUM-DEXTROSE 2-4 GM/100ML-% IV SOLN: 2.0000 g | Freq: Three times a day (TID) | INTRAVENOUS | Status: DC

## 2015-10-19 MED ORDER — ASPIRIN 325 MG PO TBEC
325.0000 mg | DELAYED_RELEASE_TABLET | Freq: Two times a day (BID) | ORAL | 0 refills | Status: DC
Start: 2015-10-19 — End: 2016-01-19

## 2015-10-19 MED ORDER — HEPARIN SOD (PORK) LOCK FLUSH 100 UNIT/ML IV SOLN
250.0000 [IU] | INTRAVENOUS | Status: AC | PRN
  Administered 2015-10-19 (×2): 250 [IU]

## 2015-10-19 MED ORDER — METHOCARBAMOL 500 MG PO TABS: 500.0000 mg | ORAL_TABLET | Freq: Four times a day (QID) | ORAL | 0 refills | Status: DC | PRN

## 2015-10-19 MED ORDER — OXYCODONE-ACETAMINOPHEN 5-325 MG PO TABS: 1.0000 | ORAL_TABLET | ORAL | 0 refills | Status: DC | PRN

## 2015-10-19 NOTE — Care Management Important Message (Signed)
Important Message  Patient Details  Name: Steven Charles MRN: [id number] Date of Birth: 04-Aug-1945   Medicare Important Message Given:  Yes    Loann Quill 10/19/2015, 9:07 AM

## 2015-10-19 NOTE — Telephone Encounter (Signed)
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 Pt was D/c sent to SNF on 7/24, will f/u w/Dr. Ninfa Linden in 2 wks...Johny Chess

## 2015-10-19 NOTE — Progress Notes (Signed)
Patient ID: Steven Charles, male   DOB: January 02, 1946, 70 y.o.   MRN: [id number] No acute changes.  Post-left knee excision of all implants secondary to infection.  Incision looks good.  Follow-ed by Infectious Disease.  Will be on IV Ancef until 11/25/15.  Can discharge to skilled nursing at this standpoint.

## 2015-10-19 NOTE — Patient Outreach (Signed)
[facility] Care Management  10/19/2015  Steven Charles 27-Dec-1945 [id number]   Noted Steven Charles transfer to [place] facility today for rehabilitation care. I have referred Steven Charles to our social work team for continued community case management support.   Plan: Collaboration with LCSW and ongoing case management in the community when Steven Charles discharges to home.    [place] Management  [phone]

## 2015-10-19 NOTE — Progress Notes (Signed)
Called SN[facility], gave report to Idamae Lusher, Unit Manager, [place] to arrive at [facility] at 1230, reviewed discharge instructions with patient with full understanding. Chaquita Basques A Keitra Carusone, RN

## 2015-10-19 NOTE — Clinical Social Work Placement (Signed)
   CLINICAL SOCIAL WORK PLACEMENT  NOTE  Date:  10/19/2015  Patient Details  Name: Steven Charles MRN: [id number] Date of Birth: January 01, 1946  Clinical Social Work is seeking post-discharge placement for this patient at the [place] level of care (*CSW will initial, date and re-position this form in  chart as items are completed):  Yes   Patient/family provided with [place] Work Department's list of facilities offering this level of care within the geographic area requested by the patient (or if unable, by the patient's family).  Yes   Patient/family informed of their freedom to choose among providers that offer the needed level of care, that participate in Medicare, Medicaid or managed care program needed by the patient, have an available bed and are willing to accept the patient.  Yes   Patient/family informed of [facility]'s ownership interest in [facility] and [facility], as well as of the fact that they are under no obligation to receive care at these facilities.  PASRR submitted to EDS on 10/15/15     PASRR number received on 10/15/15     Existing PASRR number confirmed on       FL2 transmitted to all facilities in geographic area requested by pt/family on 10/15/15     FL2 transmitted to all facilities within larger geographic area on       Patient informed that his/her managed care company has contracts with or will negotiate with certain facilities, including the following:        Yes   Patient/family informed of bed offers received.  Patient chooses bed at [place] [facility]     Physician recommends and patient chooses bed at      Patient to be transferred to [facility] [place] on 10/19/15.  Patient to be transferred to facility by [facility]     Patient family notified on 10/19/15 of transfer.  Name of family member notified:  Horris Latino, wife at bedside     PHYSICIAN Please sign FL2     Additional Comment:     _______________________________________________ Dulcy Fanny, LCSW 10/19/2015, 11:18 AM

## 2015-10-19 NOTE — Discharge Instructions (Signed)
No weight on left leg at all. Can get dressing wet in the shower daily. Leave current dressing on and in place until his outpatient follow-up.

## 2015-10-19 NOTE — Clinical Social Work Note (Signed)
Patient will discharge today per MD order. Patient will discharge to: [facility] [place] [facility] RN to call report prior to transportation to: [phone] Transportation: [facility]- scheduled for 1pm per SNF request  CSW sent discharge summary to SNF for review.  RN, wife and patient were all updated and are all agreeable with discharge plans.  Nonnie Done, [place] [phone] and Psychiatric Service Line  Licensed Clinical Social Worker
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
## 2015-10-20 DIAGNOSIS — E784 Other hyperlipidemia: Secondary | ICD-10-CM | POA: Diagnosis not present

## 2015-10-20 DIAGNOSIS — T8454XS Infection and inflammatory reaction due to internal left knee prosthesis, sequela: Secondary | ICD-10-CM | POA: Diagnosis not present

## 2015-10-20 DIAGNOSIS — Z7401 Bed confinement status: Secondary | ICD-10-CM | POA: Diagnosis not present

## 2015-10-20 DIAGNOSIS — D6949 Other primary thrombocytopenia: Secondary | ICD-10-CM | POA: Diagnosis not present

## 2015-10-20 DIAGNOSIS — I10 Essential (primary) hypertension: Secondary | ICD-10-CM | POA: Diagnosis not present

## 2015-10-20 DIAGNOSIS — Z79899 Other long term (current) drug therapy: Secondary | ICD-10-CM | POA: Diagnosis not present

## 2015-10-20 DIAGNOSIS — R279 Unspecified lack of coordination: Secondary | ICD-10-CM | POA: Diagnosis not present

## 2015-10-20 DIAGNOSIS — E1140 Type 2 diabetes mellitus with diabetic neuropathy, unspecified: Secondary | ICD-10-CM | POA: Diagnosis not present

## 2015-10-20 DIAGNOSIS — R2689 Other abnormalities of gait and mobility: Secondary | ICD-10-CM | POA: Diagnosis not present

## 2015-10-20 DIAGNOSIS — I509 Heart failure, unspecified: Secondary | ICD-10-CM | POA: Diagnosis not present

## 2015-10-20 DIAGNOSIS — M6281 Muscle weakness (generalized): Secondary | ICD-10-CM | POA: Diagnosis not present

## 2015-10-20 DIAGNOSIS — E559 Vitamin D deficiency, unspecified: Secondary | ICD-10-CM | POA: Diagnosis not present

## 2015-10-20 DIAGNOSIS — I2510 Atherosclerotic heart disease of native coronary artery without angina pectoris: Secondary | ICD-10-CM | POA: Diagnosis not present

## 2015-10-20 DIAGNOSIS — K219 Gastro-esophageal reflux disease without esophagitis: Secondary | ICD-10-CM | POA: Diagnosis not present

## 2015-10-20 DIAGNOSIS — E785 Hyperlipidemia, unspecified: Secondary | ICD-10-CM | POA: Diagnosis not present

## 2015-10-20 DIAGNOSIS — E119 Type 2 diabetes mellitus without complications: Secondary | ICD-10-CM | POA: Diagnosis not present

## 2015-10-20 DIAGNOSIS — N400 Enlarged prostate without lower urinary tract symptoms: Secondary | ICD-10-CM | POA: Diagnosis not present

## 2015-10-20 DIAGNOSIS — T8454XD Infection and inflammatory reaction due to internal left knee prosthesis, subsequent encounter: Secondary | ICD-10-CM | POA: Diagnosis not present

## 2015-10-21 DIAGNOSIS — E119 Type 2 diabetes mellitus without complications: Secondary | ICD-10-CM | POA: Diagnosis not present

## 2015-10-21 DIAGNOSIS — I10 Essential (primary) hypertension: Secondary | ICD-10-CM | POA: Diagnosis not present

## 2015-10-21 DIAGNOSIS — E785 Hyperlipidemia, unspecified: Secondary | ICD-10-CM | POA: Diagnosis not present

## 2015-10-21 DIAGNOSIS — T8454XS Infection and inflammatory reaction due to internal left knee prosthesis, sequela: Secondary | ICD-10-CM | POA: Diagnosis not present

## 2015-10-22 DIAGNOSIS — D6949 Other primary thrombocytopenia: Secondary | ICD-10-CM | POA: Diagnosis not present

## 2015-10-22 DIAGNOSIS — E119 Type 2 diabetes mellitus without complications: Secondary | ICD-10-CM | POA: Diagnosis not present

## 2015-10-22 DIAGNOSIS — T8454XD Infection and inflammatory reaction due to internal left knee prosthesis, subsequent encounter: Secondary | ICD-10-CM | POA: Diagnosis not present

## 2015-10-22 DIAGNOSIS — E784 Other hyperlipidemia: Secondary | ICD-10-CM | POA: Diagnosis not present

## 2015-10-22 DIAGNOSIS — I2510 Atherosclerotic heart disease of native coronary artery without angina pectoris: Secondary | ICD-10-CM | POA: Diagnosis not present

## 2015-10-26 ENCOUNTER — Other Ambulatory Visit: Payer: Self-pay | Admitting: *Deleted

## 2015-10-27 ENCOUNTER — Encounter: Payer: Self-pay | Admitting: Licensed Clinical Social Worker

## 2015-10-27 ENCOUNTER — Other Ambulatory Visit: Payer: Self-pay | Admitting: Licensed Clinical Social Worker

## 2015-10-27 NOTE — Patient Outreach (Signed)
[facility]) Care Management  [facility] Social Work  10/27/2015  Steven Charles 22-Dec-1945 [id number]  Subjective:    Objective:   Encounter Medications:  Outpatient Encounter Prescriptions as of 10/27/2015  Medication Sig  . alfuzosin (UROXATRAL) 10 MG 24 hr tablet Take 10 mg by mouth daily with breakfast.   . Ascorbic Acid (VITAMIN C) 500 MG tablet Take 500 mg by mouth daily.    Marland Kitchen aspirin 325 MG EC tablet Take 1 tablet (325 mg total) by mouth 2 (two) times daily after a meal.  . atorvastatin (LIPITOR) 40 MG tablet Take 1 tablet (40 mg total) by mouth daily.  . Blood Glucose Monitoring Suppl (ONE TOUCH ULTRA 2) w/Device KIT Use the blood sugar meter to monitor your blood sugar 1-4 times per day as instructed.  . carvedilol (COREG) 12.5 MG tablet Take 1 tablet (12.5 mg total) by mouth 2 (two) times daily with a meal.  . ceFAZolin (ANCEF) 2-4 GM/100ML-% IVPB Inject 100 mLs (2 g total) into the vein every 8 (eight) hours.  . clotrimazole-betamethasone (LOTRISONE) cream Apply 1 application topically daily as needed (for rash).  Marland Kitchen docusate sodium (COLACE) 100 MG capsule Take 100 mg by mouth 2 (two) times daily.   . finasteride (PROSCAR) 5 MG tablet Take 5 mg by mouth daily.   Marland Kitchen FLUoxetine (PROZAC) 40 MG capsule Take 1 capsule (40 mg total) by mouth daily.  . furosemide (LASIX) 40 MG tablet Take 0.5 tablets (20 mg total) by mouth 2 (two) times daily.  Marland Kitchen gabapentin (NEURONTIN) 300 MG capsule Take 1 capsule (300 mg total) by mouth 2 (two) times daily.  Marland Kitchen glucose blood (ONE TOUCH ULTRA TEST) test strip Use as instructed  . Lancets (FREESTYLE) lancets Use as instructed  . lisinopril (PRINIVIL,ZESTRIL) 20 MG tablet Take 1 tablet (20 mg total) by mouth daily.  Marland Kitchen LORazepam (ATIVAN) 1 MG tablet Take 1 tablet (1 mg total) by mouth 3 (three) times daily as needed. (Patient taking differently: Take 1 mg by mouth 3 (three) times daily as needed for anxiety. )  . metFORMIN (GLUCOPHAGE) 500 MG  tablet Take 1 tablet (500 mg total) by mouth 2 (two) times daily with a meal. (Patient not taking: Reported on 10/07/2015)  . methocarbamol (ROBAXIN) 500 MG tablet Take 1 tablet (500 mg total) by mouth every 6 (six) hours as needed for muscle spasms.  . Multiple Vitamin (MULTIVITAMIN) tablet Take 1 tablet by mouth daily.  . Omega-3 Fatty Acids (FISH OIL) 1000 MG CAPS Take 1,000 mg by mouth 2 (two) times daily.  Marland Kitchen oxyCODONE-acetaminophen (ROXICET) 5-325 MG tablet Take 1-2 tablets by mouth every 4 (four) hours as needed for severe pain.  . pantoprazole (PROTONIX) 40 MG tablet Take 1 tablet (40 mg total) by mouth 2 (two) times daily.  Vladimir Faster Glycol-Propyl Glycol (SYSTANE ULTRA) 0.4-0.3 % SOLN Place 1 drop into both eyes daily as needed (for dry eyes). Reported on 10/07/2015  . potassium chloride SA (K-DUR,KLOR-CON) 20 MEQ tablet Take 1 tablet (20 mEq total) by mouth 2 (two) times daily.  . tamsulosin (FLOMAX) 0.4 MG CAPS capsule Take 1 capsule (0.4 mg total) by mouth 2 (two) times daily.   No facility-administered encounter medications on file as of 10/27/2015.     Functional Status:  In your present state of health, do you have any difficulty performing the following activities: 10/27/2015 10/14/2015  Hearing? N -  Vision? N -  Difficulty concentrating or making decisions? Y -  Walking or  climbing stairs? Y -  Dressing or bathing? Y -  Doing errands, shopping? Y N  Preparing Food and eating ? N -  Using the Toilet? Y -  In the past six months, have you accidently leaked urine? N -  Do you have problems with loss of bowel control? N -  Managing your Medications? N -  Managing your Finances? N -  Housekeeping or managing your Housekeeping? Y -  Some recent data might be hidden    Fall/Depression Screening:  PHQ 2/9 Scores 10/27/2015 09/23/2015 02/17/2015 08/14/2014 08/13/2013 02/01/2013  PHQ - 2 Score 2 0 0 1 0 1  PHQ- 9 Score 5 - - - - -    Assessment:   CSW traveled to [place] on 10/27/15 to meet client. Upon arrival at [facility], [place] spoke with receptionist at [facility]. She said that client had been at [facility] and had admitted to facility on 10/19/15. However, she said that client then discharged from [facility] and was admitted to [facility] in [place], [place]. Receptionist said client thus stayed one day at [facility]. He admitted on 10/19/15 to [facility] and discharged from [facility] to admit to [facility] on 10/20/15.  Client has resided in [place], [place] and wants to return to his home in [place] when discharged from nursing facility care. CSW then traveled to [facility] in [place], [place] on 10/27/15 and met with client at that facility. Patient assessed in  [place] for continued care needs. CSW will continue to collaborate with the skilled nursing facility social worker to facilitate discharge planning needs and communicate with the patient and family. Client has been receiving nursing care at facility. Client has been receiving physical therapy care at facility. Client said that physical therapy sessions for client were helping client. Client hopes to eventually discharge from facility and return home with his wife with needed supports in place. CSW and client spoke of client care plan. CSW encouraged client to participate in all scheduled client physical therapy sessions in next 30 days at facility.  CSW and client reviewed and completed needed [facility] assessments for client.  Client had previously been receiving [facility] nursing support in the community with RN Janalyn Shy. Client spoke of procedures he had undergone on his left knee recently.  Client said he is currently receiving medication for infection issue in left knee.   Client said he has pain in left knee.  Client has [employer].  Client said he may have to have medication  to treat his infection for about 4 more weeks. He said he is not sure of how long he will be at [facility] to receive care.  CSW encouraged client to speak with facility social worker to develop and finalize discharge plans for client discharge. CSW gave [facility] CSW card and encouraged Irby to call CSW as needed to discuss social work needs of client.  Plan:   Client to participate in all scheduled physical therapy sessions for client in the next 30 days at [facility]. CSW to call client in 3 weeks to assess client status and needs at that time.  Norva Riffle.Elva Breaker MSW, LCSW Licensed Clinical Social Worker [facility] Care Management [phone]

## 2015-10-27 NOTE — Patient Outreach (Deleted)
[facility]) Care Management  10/27/2015  Steven Charles [birth year] [id number]   I reached out to Irven Shelling, Admissions Coordinator at [facility] today to follow up on Steven Charles rehabilitation progress. Steven Charles indicated that Steven Charles did not come to [facility] for rehab as was planned prior to his hospital discharge. I will follow up with the hospital case management team re: Steven Charles disposition.    [place] Management  [phone]

## 2015-10-30 DIAGNOSIS — I2510 Atherosclerotic heart disease of native coronary artery without angina pectoris: Secondary | ICD-10-CM | POA: Diagnosis not present

## 2015-10-30 DIAGNOSIS — E785 Hyperlipidemia, unspecified: Secondary | ICD-10-CM | POA: Diagnosis not present

## 2015-10-30 DIAGNOSIS — T8454XS Infection and inflammatory reaction due to internal left knee prosthesis, sequela: Secondary | ICD-10-CM | POA: Diagnosis not present

## 2015-10-30 DIAGNOSIS — E119 Type 2 diabetes mellitus without complications: Secondary | ICD-10-CM | POA: Diagnosis not present

## 2015-11-07 DIAGNOSIS — I509 Heart failure, unspecified: Secondary | ICD-10-CM | POA: Diagnosis not present

## 2015-11-07 DIAGNOSIS — Z452 Encounter for adjustment and management of vascular access device: Secondary | ICD-10-CM | POA: Diagnosis not present

## 2015-11-07 DIAGNOSIS — E785 Hyperlipidemia, unspecified: Secondary | ICD-10-CM | POA: Diagnosis not present

## 2015-11-07 DIAGNOSIS — Z7902 Long term (current) use of antithrombotics/antiplatelets: Secondary | ICD-10-CM | POA: Diagnosis not present

## 2015-11-07 DIAGNOSIS — E119 Type 2 diabetes mellitus without complications: Secondary | ICD-10-CM | POA: Diagnosis not present

## 2015-11-07 DIAGNOSIS — Z7982 Long term (current) use of aspirin: Secondary | ICD-10-CM | POA: Diagnosis not present

## 2015-11-07 DIAGNOSIS — I2510 Atherosclerotic heart disease of native coronary artery without angina pectoris: Secondary | ICD-10-CM | POA: Diagnosis not present

## 2015-11-07 DIAGNOSIS — F331 Major depressive disorder, recurrent, moderate: Secondary | ICD-10-CM | POA: Diagnosis not present

## 2015-11-07 DIAGNOSIS — Z5181 Encounter for therapeutic drug level monitoring: Secondary | ICD-10-CM | POA: Diagnosis not present

## 2015-11-07 DIAGNOSIS — F411 Generalized anxiety disorder: Secondary | ICD-10-CM | POA: Diagnosis not present

## 2015-11-07 DIAGNOSIS — Z792 Long term (current) use of antibiotics: Secondary | ICD-10-CM | POA: Diagnosis not present

## 2015-11-07 DIAGNOSIS — M6281 Muscle weakness (generalized): Secondary | ICD-10-CM | POA: Diagnosis not present

## 2015-11-07 DIAGNOSIS — I110 Hypertensive heart disease with heart failure: Secondary | ICD-10-CM | POA: Diagnosis not present

## 2015-11-07 DIAGNOSIS — T8454XA Infection and inflammatory reaction due to internal left knee prosthesis, initial encounter: Secondary | ICD-10-CM | POA: Diagnosis not present

## 2015-11-07 DIAGNOSIS — N400 Enlarged prostate without lower urinary tract symptoms: Secondary | ICD-10-CM | POA: Diagnosis not present

## 2015-11-07 DIAGNOSIS — Z7984 Long term (current) use of oral hypoglycemic drugs: Secondary | ICD-10-CM | POA: Diagnosis not present

## 2015-11-07 DIAGNOSIS — T8454XD Infection and inflammatory reaction due to internal left knee prosthesis, subsequent encounter: Secondary | ICD-10-CM | POA: Diagnosis not present

## 2015-11-07 DIAGNOSIS — R2689 Other abnormalities of gait and mobility: Secondary | ICD-10-CM | POA: Diagnosis not present

## 2015-11-09 DIAGNOSIS — Z7982 Long term (current) use of aspirin: Secondary | ICD-10-CM | POA: Diagnosis not present

## 2015-11-09 DIAGNOSIS — I110 Hypertensive heart disease with heart failure: Secondary | ICD-10-CM | POA: Diagnosis not present

## 2015-11-09 DIAGNOSIS — Z7984 Long term (current) use of oral hypoglycemic drugs: Secondary | ICD-10-CM | POA: Diagnosis not present

## 2015-11-09 DIAGNOSIS — Z7902 Long term (current) use of antithrombotics/antiplatelets: Secondary | ICD-10-CM | POA: Diagnosis not present

## 2015-11-09 DIAGNOSIS — I509 Heart failure, unspecified: Secondary | ICD-10-CM | POA: Diagnosis not present

## 2015-11-09 DIAGNOSIS — E119 Type 2 diabetes mellitus without complications: Secondary | ICD-10-CM | POA: Diagnosis not present

## 2015-11-09 DIAGNOSIS — T8454XA Infection and inflammatory reaction due to internal left knee prosthesis, initial encounter: Secondary | ICD-10-CM | POA: Diagnosis not present

## 2015-11-09 DIAGNOSIS — E785 Hyperlipidemia, unspecified: Secondary | ICD-10-CM | POA: Diagnosis not present

## 2015-11-09 DIAGNOSIS — Z5181 Encounter for therapeutic drug level monitoring: Secondary | ICD-10-CM | POA: Diagnosis not present

## 2015-11-09 DIAGNOSIS — Z792 Long term (current) use of antibiotics: Secondary | ICD-10-CM | POA: Diagnosis not present

## 2015-11-09 DIAGNOSIS — N400 Enlarged prostate without lower urinary tract symptoms: Secondary | ICD-10-CM | POA: Diagnosis not present

## 2015-11-09 DIAGNOSIS — Z452 Encounter for adjustment and management of vascular access device: Secondary | ICD-10-CM | POA: Diagnosis not present

## 2015-11-09 DIAGNOSIS — F331 Major depressive disorder, recurrent, moderate: Secondary | ICD-10-CM | POA: Diagnosis not present

## 2015-11-09 DIAGNOSIS — F411 Generalized anxiety disorder: Secondary | ICD-10-CM | POA: Diagnosis not present

## 2015-11-10 DIAGNOSIS — E785 Hyperlipidemia, unspecified: Secondary | ICD-10-CM | POA: Diagnosis not present

## 2015-11-10 DIAGNOSIS — Z7984 Long term (current) use of oral hypoglycemic drugs: Secondary | ICD-10-CM | POA: Diagnosis not present

## 2015-11-10 DIAGNOSIS — E119 Type 2 diabetes mellitus without complications: Secondary | ICD-10-CM | POA: Diagnosis not present

## 2015-11-10 DIAGNOSIS — Z5181 Encounter for therapeutic drug level monitoring: Secondary | ICD-10-CM | POA: Diagnosis not present

## 2015-11-10 DIAGNOSIS — Z7982 Long term (current) use of aspirin: Secondary | ICD-10-CM | POA: Diagnosis not present

## 2015-11-10 DIAGNOSIS — I509 Heart failure, unspecified: Secondary | ICD-10-CM | POA: Diagnosis not present

## 2015-11-10 DIAGNOSIS — F331 Major depressive disorder, recurrent, moderate: Secondary | ICD-10-CM | POA: Diagnosis not present

## 2015-11-10 DIAGNOSIS — Z452 Encounter for adjustment and management of vascular access device: Secondary | ICD-10-CM | POA: Diagnosis not present

## 2015-11-10 DIAGNOSIS — I110 Hypertensive heart disease with heart failure: Secondary | ICD-10-CM | POA: Diagnosis not present

## 2015-11-10 DIAGNOSIS — T8454XA Infection and inflammatory reaction due to internal left knee prosthesis, initial encounter: Secondary | ICD-10-CM | POA: Diagnosis not present

## 2015-11-10 DIAGNOSIS — N400 Enlarged prostate without lower urinary tract symptoms: Secondary | ICD-10-CM | POA: Diagnosis not present

## 2015-11-10 DIAGNOSIS — Z7902 Long term (current) use of antithrombotics/antiplatelets: Secondary | ICD-10-CM | POA: Diagnosis not present

## 2015-11-10 DIAGNOSIS — F411 Generalized anxiety disorder: Secondary | ICD-10-CM | POA: Diagnosis not present

## 2015-11-10 DIAGNOSIS — Z792 Long term (current) use of antibiotics: Secondary | ICD-10-CM | POA: Diagnosis not present

## 2015-11-11 ENCOUNTER — Other Ambulatory Visit: Payer: Self-pay | Admitting: *Deleted

## 2015-11-11 ENCOUNTER — Other Ambulatory Visit: Payer: Self-pay | Admitting: Licensed Clinical Social Worker

## 2015-11-11 DIAGNOSIS — T8454XA Infection and inflammatory reaction due to internal left knee prosthesis, initial encounter: Secondary | ICD-10-CM | POA: Diagnosis not present

## 2015-11-11 DIAGNOSIS — Z7984 Long term (current) use of oral hypoglycemic drugs: Secondary | ICD-10-CM | POA: Diagnosis not present

## 2015-11-11 DIAGNOSIS — I110 Hypertensive heart disease with heart failure: Secondary | ICD-10-CM | POA: Diagnosis not present

## 2015-11-11 DIAGNOSIS — N400 Enlarged prostate without lower urinary tract symptoms: Secondary | ICD-10-CM | POA: Diagnosis not present

## 2015-11-11 DIAGNOSIS — E119 Type 2 diabetes mellitus without complications: Secondary | ICD-10-CM | POA: Diagnosis not present

## 2015-11-11 DIAGNOSIS — Z792 Long term (current) use of antibiotics: Secondary | ICD-10-CM | POA: Diagnosis not present

## 2015-11-11 DIAGNOSIS — E782 Mixed hyperlipidemia: Secondary | ICD-10-CM | POA: Diagnosis not present

## 2015-11-11 NOTE — Patient Outreach (Signed)
Assessment:  CSW spoke via phone with client on 11/11/15. CSW verified client identity. CSW and client spoke of client needs. Client had been residing recently at [facility] in [place], [place]. He had been receiving nursing care and physical therapy support at [facility]. Client recently discharged from that care facility and returned to his home in [place], [place]. Client said he was adjusting to being home. He said he had received a wheelchair through [facility]. He said wheelchair was wide and he felt it was too large and heavy for him. He said he also has a walker at home for help in ambulation. He said that [facility] is also providing in home physical therapy for client as scheduled.  He said physical therapy is helpful to client. He said he has pain in his left leg.  He said that RN from [facility] visited him today.  He said he has his prescribed medications and is taking medications as prescribed. CSW and client spoke of client care plan. CSW encouraged [place] to participate in home health physical therapy sessions scheduled for client in next 30 days with [facility].  CSW informed Marico that CSW would call RN Janalyn Shy on 11/11/15 and inform RN of above information. Client agreed with this plan. CSW thanked Diamontae for phone call with CSW on 11/11/15.   Plan:  Client to participate in home health physical therapy sessions for client in next 30 days with [facility].  CSW to collaborate with RN Janalyn Shy in monitoring client needs.  CSW to call client as scheduled to determine client needs at that time.  Norva Riffle.Radley Teston MSW, LCSW Licensed Clinical Social Worker [facility] Care Management [phone]        Plan:

## 2015-11-11 NOTE — Patient Outreach (Signed)
[facility]) Care Management  11/11/2015  BRENDA MILUM 11-13-1945 [id number]   Ongoing collaboration with Theadore Nan LCSW re: Mr. Betancourt progress at [facility].  Mr Shea Evans is primary care management contact while Mr. Mitri is at skilled nursing care facility. I will resume outreach to Mr. Blane upon his discharge from skilled nursing facility.    [facility] Management  [phone]

## 2015-11-12 ENCOUNTER — Encounter: Payer: Self-pay | Admitting: *Deleted

## 2015-11-12 ENCOUNTER — Other Ambulatory Visit: Payer: Self-pay | Admitting: *Deleted

## 2015-11-12 DIAGNOSIS — I509 Heart failure, unspecified: Secondary | ICD-10-CM | POA: Diagnosis not present

## 2015-11-12 DIAGNOSIS — Z7984 Long term (current) use of oral hypoglycemic drugs: Secondary | ICD-10-CM | POA: Diagnosis not present

## 2015-11-12 DIAGNOSIS — Z5181 Encounter for therapeutic drug level monitoring: Secondary | ICD-10-CM | POA: Diagnosis not present

## 2015-11-12 DIAGNOSIS — E785 Hyperlipidemia, unspecified: Secondary | ICD-10-CM | POA: Diagnosis not present

## 2015-11-12 DIAGNOSIS — F411 Generalized anxiety disorder: Secondary | ICD-10-CM | POA: Diagnosis not present

## 2015-11-12 DIAGNOSIS — F331 Major depressive disorder, recurrent, moderate: Secondary | ICD-10-CM | POA: Diagnosis not present

## 2015-11-12 DIAGNOSIS — I110 Hypertensive heart disease with heart failure: Secondary | ICD-10-CM | POA: Diagnosis not present

## 2015-11-12 DIAGNOSIS — Z452 Encounter for adjustment and management of vascular access device: Secondary | ICD-10-CM | POA: Diagnosis not present

## 2015-11-12 DIAGNOSIS — N400 Enlarged prostate without lower urinary tract symptoms: Secondary | ICD-10-CM | POA: Diagnosis not present

## 2015-11-12 DIAGNOSIS — Z7902 Long term (current) use of antithrombotics/antiplatelets: Secondary | ICD-10-CM | POA: Diagnosis not present

## 2015-11-12 DIAGNOSIS — T8454XA Infection and inflammatory reaction due to internal left knee prosthesis, initial encounter: Secondary | ICD-10-CM | POA: Diagnosis not present

## 2015-11-12 DIAGNOSIS — Z792 Long term (current) use of antibiotics: Secondary | ICD-10-CM | POA: Diagnosis not present

## 2015-11-12 DIAGNOSIS — Z7982 Long term (current) use of aspirin: Secondary | ICD-10-CM | POA: Diagnosis not present

## 2015-11-12 DIAGNOSIS — E119 Type 2 diabetes mellitus without complications: Secondary | ICD-10-CM | POA: Diagnosis not present

## 2015-11-12 NOTE — Patient Outreach (Signed)
[facility]) Care Management  11/12/2015  JONNY SPRAGG Apr 11, 1945 [id number]  Mr. Horace Robards. Dorrough is a 70 year old patient referred to [facility] after his recent hospital admission 08/28/15-08/31/15 for I&D and revision of the tibial component of infected left total knee arthroplasty. Mr. Newcomer had placement of antibiotic beads, wound VAC, and a PICC line for 6 weeks IV antibiotics.  Mr. Cropsey was newly diagnosed with DM Type II during this admission. He was seen by the dietician and diabetes coordinator during his hospitalization and has followed up with his primary care provider since for continued education and management of diabetes. He has been checking cbg's at home twice daily and readings, per his report, have been in the 130-140 range.   Mr. Delledonne was discharged to home with home health services including nursing and physical therapy. A rolling walker was provided to him prior to hospital discharge. Mr. Bufalini returned for surgical revision of his total knee replacement, was subsequently discharged to [facility] for short term rehabilitation, and discharged to home last week, as per my conversation with Nicki Reaper Forrest LCSW. Mr. Shea Evans indicates that Mr. [place]ley is working with home health physical therapy and nursing via [facility].   I was unable to reach Mr. Fallone by phone today.   Plan: I will reach out to Mr. Sudbury by phone next week to follow up on his progress and assess for any transition of care needs or case management services.    [facility] [employer] Management  [phone]

## 2015-11-16 ENCOUNTER — Encounter: Payer: Self-pay | Admitting: *Deleted

## 2015-11-16 ENCOUNTER — Other Ambulatory Visit: Payer: Self-pay | Admitting: *Deleted

## 2015-11-16 NOTE — Patient Outreach (Signed)
[facility]) Care Management  11/16/2015  Steven Charles 1945/12/23 [id number]   I spoke with Steven Charles this afternoon who told me the wheelchair given to Steven Charles and picked up by Steven Charles at [employer] (retail store in [place]) was too wide to roll through all the doorways in their home and was too heavy for her to push without Steven Charles it.   I reached out to [facility] in [facility] and left a message requesting a return call so that we can further discuss Steven Charles DME needs.   Plan: I will reach out to Steven Charles by phone in follow up.    [facility] Management  [phone]

## 2015-11-17 ENCOUNTER — Telehealth: Payer: Self-pay | Admitting: Cardiology

## 2015-11-17 ENCOUNTER — Encounter: Payer: Self-pay | Admitting: Infectious Disease

## 2015-11-17 ENCOUNTER — Other Ambulatory Visit: Payer: Self-pay | Admitting: Licensed Clinical Social Worker

## 2015-11-17 DIAGNOSIS — Z452 Encounter for adjustment and management of vascular access device: Secondary | ICD-10-CM | POA: Diagnosis not present

## 2015-11-17 DIAGNOSIS — Z7902 Long term (current) use of antithrombotics/antiplatelets: Secondary | ICD-10-CM | POA: Diagnosis not present

## 2015-11-17 DIAGNOSIS — Z792 Long term (current) use of antibiotics: Secondary | ICD-10-CM | POA: Diagnosis not present

## 2015-11-17 DIAGNOSIS — Z7984 Long term (current) use of oral hypoglycemic drugs: Secondary | ICD-10-CM | POA: Diagnosis not present

## 2015-11-17 DIAGNOSIS — F331 Major depressive disorder, recurrent, moderate: Secondary | ICD-10-CM | POA: Diagnosis not present

## 2015-11-17 DIAGNOSIS — Z7982 Long term (current) use of aspirin: Secondary | ICD-10-CM | POA: Diagnosis not present

## 2015-11-17 DIAGNOSIS — E119 Type 2 diabetes mellitus without complications: Secondary | ICD-10-CM | POA: Diagnosis not present

## 2015-11-17 DIAGNOSIS — N400 Enlarged prostate without lower urinary tract symptoms: Secondary | ICD-10-CM | POA: Diagnosis not present

## 2015-11-17 DIAGNOSIS — I509 Heart failure, unspecified: Secondary | ICD-10-CM | POA: Diagnosis not present

## 2015-11-17 DIAGNOSIS — Z5181 Encounter for therapeutic drug level monitoring: Secondary | ICD-10-CM | POA: Diagnosis not present

## 2015-11-17 DIAGNOSIS — T8454XA Infection and inflammatory reaction due to internal left knee prosthesis, initial encounter: Secondary | ICD-10-CM | POA: Diagnosis not present

## 2015-11-17 DIAGNOSIS — F411 Generalized anxiety disorder: Secondary | ICD-10-CM | POA: Diagnosis not present

## 2015-11-17 DIAGNOSIS — I110 Hypertensive heart disease with heart failure: Secondary | ICD-10-CM | POA: Diagnosis not present

## 2015-11-17 DIAGNOSIS — E785 Hyperlipidemia, unspecified: Secondary | ICD-10-CM | POA: Diagnosis not present

## 2015-11-17 NOTE — Telephone Encounter (Signed)
New Message  Pt c/o medication issue:  1. Name of Medication: Eliquis  2. How are you currently taking this medication (dosage and times per day)? 5mg , twice daily  3. Are you having a reaction (difficulty breathing--STAT)? No  4. What is your medication issue? Pts wife voiced insurance company is requesting pre-authorization from [employer].   Pts wife also voiced and questioned if the pt should be taking this medication or not.  Please follow up with pt. Thanks!

## 2015-11-17 NOTE — Telephone Encounter (Signed)
apixaban was initiated by orthopedics; please address this with them; glucose management per primary care [facility]

## 2015-11-17 NOTE — Patient Outreach (Signed)
Assessment:  CSW spoke via phone with client on 11/17/15. CSW verified client identity. CSW received verbal permission from client on 11/17/15 for CSW to speak with client about current client needs.Client said he is adjusting to being home after discharge of client from [facility]. Client said he is receiving home health physical therapy sessions as scheduled.with [facility]. Client said that RN from [facility] is also visiting client in the home as scheduled to assess client nursing needs.  Client said that RN from [facility] had visited client today in client's home to assess client's nursing needs.  Client is also receiving [facility] nursing support with RN Janalyn Shy.  Client has pain in left knee and reported that he has been taking pain medication as prescribed. Client has family support in the home. CSW and [place] spoke of client care plan. CSW encouraged Shanne to participate in all scheduled home health physical therapy sessions for client in next 30 days with [facility]. Client said he had his prescribed medications and is taking medications as prescribed.  He said he has good support from his wife and from his friends.  Client said he has a rolling walker and is using rolling walker to help him ambulate.  Client said he fatigues easily. Client said he does not use oxygen in the home. Client uses inhaler as prescribed.  CSW thanked client for phone call with CSW on 11/17/15. CSW encouraged Cevin to call CSW at [phone] as needed to discuss social work needs of client.     Plan:  Client to participate in all scheduled home health  physical therapy sessions for client in the next 30 days with [facility].  CSW to collaborate with RN Janalyn Shy in monitoring needs of client.  CSW to call client in 3 weeks to assess needs of client at that time.  Norva Riffle.Ceaser Ebeling MSW, LCSW Licensed Clinical Social Worker [facility] Care  Management [phone]

## 2015-11-17 NOTE — Telephone Encounter (Signed)
Spoke to [place], wife of patient. She notes pt recently d/c'ed from SNF following knee operation complications and removal of infected device and tissue. Was prescribed Eliquis as a component of treatment, she wants to know whether he should still be taking this. Aware that if he is, would require a prior authorization.  She also had concerns that the patient has had to be put on metformin in context of recent hospitalization, new onset DM, but the primary care is declining to fill this medication "because they say he doesn't have diabetes". Notes she is checking CBGs daily and giving metformin as indicated by SNF. I encouraged her to speak directly to RN for the provider to make sure they have pt's most up to date information, PCP may fill if they understand that the med was recently prescribed for new diagnosis. She voiced understanding.   Will defer to Dr. Stanford Breed for advice on Eliquis - pt will need refill in advance of any appointment. Pt has not been seen in a year and wife states it would be difficult to get in for appt for now since he has no knee and consequent mobility issues.  Please advise, thanks.

## 2015-11-18 NOTE — Telephone Encounter (Signed)
Spoke with pt, Aware of dr crenshaw's recommendations.  °

## 2015-11-19 ENCOUNTER — Other Ambulatory Visit: Payer: Self-pay | Admitting: *Deleted

## 2015-11-19 DIAGNOSIS — Z5181 Encounter for therapeutic drug level monitoring: Secondary | ICD-10-CM | POA: Diagnosis not present

## 2015-11-19 DIAGNOSIS — E119 Type 2 diabetes mellitus without complications: Secondary | ICD-10-CM | POA: Diagnosis not present

## 2015-11-19 DIAGNOSIS — E785 Hyperlipidemia, unspecified: Secondary | ICD-10-CM | POA: Diagnosis not present

## 2015-11-19 DIAGNOSIS — T8454XA Infection and inflammatory reaction due to internal left knee prosthesis, initial encounter: Secondary | ICD-10-CM | POA: Diagnosis not present

## 2015-11-19 DIAGNOSIS — Z792 Long term (current) use of antibiotics: Secondary | ICD-10-CM | POA: Diagnosis not present

## 2015-11-19 DIAGNOSIS — I110 Hypertensive heart disease with heart failure: Secondary | ICD-10-CM | POA: Diagnosis not present

## 2015-11-19 DIAGNOSIS — F331 Major depressive disorder, recurrent, moderate: Secondary | ICD-10-CM | POA: Diagnosis not present

## 2015-11-19 DIAGNOSIS — Z7982 Long term (current) use of aspirin: Secondary | ICD-10-CM | POA: Diagnosis not present

## 2015-11-19 DIAGNOSIS — N400 Enlarged prostate without lower urinary tract symptoms: Secondary | ICD-10-CM | POA: Diagnosis not present

## 2015-11-19 DIAGNOSIS — Z452 Encounter for adjustment and management of vascular access device: Secondary | ICD-10-CM | POA: Diagnosis not present

## 2015-11-19 DIAGNOSIS — Z7902 Long term (current) use of antithrombotics/antiplatelets: Secondary | ICD-10-CM | POA: Diagnosis not present

## 2015-11-19 DIAGNOSIS — I509 Heart failure, unspecified: Secondary | ICD-10-CM | POA: Diagnosis not present

## 2015-11-19 DIAGNOSIS — F411 Generalized anxiety disorder: Secondary | ICD-10-CM | POA: Diagnosis not present

## 2015-11-19 DIAGNOSIS — Z7984 Long term (current) use of oral hypoglycemic drugs: Secondary | ICD-10-CM | POA: Diagnosis not present

## 2015-11-19 NOTE — Patient Outreach (Signed)
[facility]) Care Management  11/19/2015  MONTEY FINUCANE 1946/01/18 [id number]   I received a call from the retail [employer] store in [place] today notifying me that they now have a 20 inch wheelchair in stock if Mrs. Minassian would like to return the previous wheelchair and trade for the 20 inch chair. The larger wheelchair they have now does not fit through all the entrances of the home and Mrs. Caster finds it to heavy to lift in and out of the car.   I contacted Mrs. Trull and she will go to the [facility] store today to trade the wheelchair.   Plan: If Mrs. Plueger encounters any difficulty with trading the chair, she will call me. I will follow up with Mr. Bastone by phone next week.    [place] Management  [phone]

## 2015-11-20 ENCOUNTER — Ambulatory Visit (INDEPENDENT_AMBULATORY_CARE_PROVIDER_SITE_OTHER): Payer: Medicare Other | Admitting: Internal Medicine

## 2015-11-20 ENCOUNTER — Other Ambulatory Visit (INDEPENDENT_AMBULATORY_CARE_PROVIDER_SITE_OTHER): Payer: Medicare Other

## 2015-11-20 ENCOUNTER — Encounter: Payer: Self-pay | Admitting: Internal Medicine

## 2015-11-20 VITALS — BP 118/62 | HR 85 | Temp 98.3°F | Resp 20 | Wt 254.0 lb

## 2015-11-20 DIAGNOSIS — I10 Essential (primary) hypertension: Secondary | ICD-10-CM

## 2015-11-20 DIAGNOSIS — M6281 Muscle weakness (generalized): Secondary | ICD-10-CM | POA: Diagnosis not present

## 2015-11-20 DIAGNOSIS — I2510 Atherosclerotic heart disease of native coronary artery without angina pectoris: Secondary | ICD-10-CM | POA: Diagnosis not present

## 2015-11-20 DIAGNOSIS — D649 Anemia, unspecified: Secondary | ICD-10-CM

## 2015-11-20 DIAGNOSIS — T8454XD Infection and inflammatory reaction due to internal left knee prosthesis, subsequent encounter: Secondary | ICD-10-CM | POA: Diagnosis not present

## 2015-11-20 DIAGNOSIS — R2689 Other abnormalities of gait and mobility: Secondary | ICD-10-CM | POA: Diagnosis not present

## 2015-11-20 DIAGNOSIS — N179 Acute kidney failure, unspecified: Secondary | ICD-10-CM

## 2015-11-20 DIAGNOSIS — Z23 Encounter for immunization: Secondary | ICD-10-CM | POA: Diagnosis not present

## 2015-11-20 DIAGNOSIS — E1100 Type 2 diabetes mellitus with hyperosmolarity without nonketotic hyperglycemic-hyperosmolar coma (NKHHC): Secondary | ICD-10-CM | POA: Diagnosis not present

## 2015-11-20 DIAGNOSIS — I5032 Chronic diastolic (congestive) heart failure: Secondary | ICD-10-CM

## 2015-11-20 LAB — BASIC METABOLIC PANEL
BUN: 24 mg/dL — ABNORMAL HIGH (ref 6–23)
CALCIUM: 10.2 mg/dL (ref 8.4–10.5)
CO2: 28 mEq/L (ref 19–32)
Chloride: 104 mEq/L (ref 96–112)
Creatinine, Ser: 1.26 mg/dL (ref 0.40–1.50)
GFR: 59.75 mL/min — AB (ref >60.00)
GLUCOSE: 104 mg/dL — AB (ref 70–99)
POTASSIUM: 4.7 meq/L (ref 3.5–5.1)
SODIUM: 138 meq/L (ref 135–145)

## 2015-11-20 LAB — CBC WITH DIFFERENTIAL/PLATELET
BASOS ABS: 0 10*3/uL (ref 0.0–0.1)
Basophils Relative: 0.4 % (ref 0.0–3.0)
EOS PCT: 4.4 % (ref 0.0–5.0)
Eosinophils Absolute: 0.3 10*3/uL (ref 0.0–0.7)
HEMATOCRIT: 31.8 % — AB (ref 39.0–52.0)
HEMOGLOBIN: 10.5 g/dL — AB (ref 13.0–17.0)
LYMPHS PCT: 13.3 % (ref 12.0–46.0)
Lymphs Abs: 1 10*3/uL (ref 0.7–4.0)
MCHC: 33.2 g/dL (ref 30.0–36.0)
MCV: 85.9 fl (ref 78.0–100.0)
MONOS PCT: 5.1 % (ref 3.0–12.0)
Monocytes Absolute: 0.4 10*3/uL (ref 0.1–1.0)
NEUTROS PCT: 76.8 % (ref 43.0–77.0)
Neutro Abs: 6 10*3/uL (ref 1.4–7.7)
Platelets: 199 10*3/uL (ref 150.0–400.0)
RBC: 3.7 Mil/uL — AB (ref 4.22–5.81)
RDW: 15.1 % (ref 11.5–15.5)
WBC: 7.8 10*3/uL (ref 4.0–10.5)

## 2015-11-20 LAB — IBC PANEL
Iron: 40 ug/dL — ABNORMAL LOW (ref 42–165)
SATURATION RATIOS: 11.3 % — AB (ref 20.0–50.0)
Transferrin: 252 mg/dL (ref 212.0–360.0)

## 2015-11-20 MED ORDER — POTASSIUM CHLORIDE CRYS ER 20 MEQ PO TBCR: 20.0000 meq | EXTENDED_RELEASE_TABLET | Freq: Every day | ORAL | 3 refills | Status: DC

## 2015-11-20 MED ORDER — POTASSIUM CHLORIDE CRYS ER 20 MEQ PO TBCR: 20.0000 meq | EXTENDED_RELEASE_TABLET | Freq: Two times a day (BID) | ORAL | 3 refills | Status: DC

## 2015-11-20 NOTE — Assessment & Plan Note (Addendum)
?   Over controlled, has no evidence volume overload today and with ongoing symptoms this far out from surgury that could be attributable to lower volume, for now to decresae the lasix to 20 qam prn, and K to qam only as well  Note:  Total time for pt hx, exam, review of record with pt in the room, determination of diagnoses and plan for further eval and tx is > 40 min, with over 50% spent in coordination and counseling of patient

## 2015-11-20 NOTE — Assessment & Plan Note (Signed)
Well controlled for now, last a1c 5.8 recently and cbg's currently in low 100 's post op, ok to hold on taking the metformin for now

## 2015-11-20 NOTE — Assessment & Plan Note (Signed)
Minimal noted post op labs - for f/u renal fxn today, may be worse if over diuresed
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
## 2015-11-20 NOTE — Assessment & Plan Note (Signed)
stable overall by history and exam, recent data reviewed with pt, and pt to continue medical treatment as before,  to f/u any worsening symptoms or concerns Lab Results  Component Value Date   HGBA1C 5.8 10/02/2015

## 2015-11-20 NOTE — Patient Instructions (Signed)
OK to stop the metformin  OK to change the Lasix 40 mg to HALF pill (20 mg) in the AM if has RIGHT leg swelling, and change the potassium to 1 in the AM if takes the lasix only  Please continue all other medications as before, and refills have been done if requested.  Please have the pharmacy call with any other refills you may need.  Please keep your appointments with your specialists as you may have planned  Please go to the LAB in the Basement (turn left off the elevator) for the tests to be done today  You will be contacted by phone if any changes need to be made immediately.  Otherwise, you will receive a letter about your results with an explanation, but please check with MyChart first.  Please remember to sign up for MyChart if you have not done so, as this will be important to you in the future with finding out test results, communicating by private email, and scheduling acute appointments online when needed.  Please return in 3 months, or sooner if needed

## 2015-11-20 NOTE — Progress Notes (Signed)
Pre visit review using our clinic review tool, if applicable. No additional management support is needed unless otherwise documented below in the visit note. 

## 2015-11-20 NOTE — Assessment & Plan Note (Signed)
Mild chronic with element of ABL after last surgury - for f/u lab today

## 2015-11-24 ENCOUNTER — Encounter: Payer: Self-pay | Admitting: Infectious Disease

## 2015-11-24 ENCOUNTER — Ambulatory Visit (INDEPENDENT_AMBULATORY_CARE_PROVIDER_SITE_OTHER): Payer: Medicare Other | Admitting: Infectious Disease

## 2015-11-24 ENCOUNTER — Telehealth: Payer: Self-pay | Admitting: *Deleted

## 2015-11-24 VITALS — BP 99/57 | HR 99 | Temp 97.8°F

## 2015-11-24 DIAGNOSIS — L089 Local infection of the skin and subcutaneous tissue, unspecified: Secondary | ICD-10-CM

## 2015-11-24 DIAGNOSIS — Z7984 Long term (current) use of oral hypoglycemic drugs: Secondary | ICD-10-CM | POA: Diagnosis not present

## 2015-11-24 DIAGNOSIS — S80252D Superficial foreign body, left knee, subsequent encounter: Secondary | ICD-10-CM | POA: Diagnosis not present

## 2015-11-24 DIAGNOSIS — T8454XA Infection and inflammatory reaction due to internal left knee prosthesis, initial encounter: Secondary | ICD-10-CM | POA: Diagnosis not present

## 2015-11-24 DIAGNOSIS — T8450XD Infection and inflammatory reaction due to unspecified internal joint prosthesis, subsequent encounter: Secondary | ICD-10-CM

## 2015-11-24 DIAGNOSIS — Z5181 Encounter for therapeutic drug level monitoring: Secondary | ICD-10-CM | POA: Diagnosis not present

## 2015-11-24 DIAGNOSIS — B957 Other staphylococcus as the cause of diseases classified elsewhere: Secondary | ICD-10-CM

## 2015-11-24 DIAGNOSIS — I959 Hypotension, unspecified: Secondary | ICD-10-CM | POA: Diagnosis not present

## 2015-11-24 DIAGNOSIS — M25562 Pain in left knee: Secondary | ICD-10-CM | POA: Diagnosis not present

## 2015-11-24 DIAGNOSIS — M00862 Arthritis due to other bacteria, left knee: Secondary | ICD-10-CM | POA: Diagnosis not present

## 2015-11-24 HISTORY — DX: Other staphylococcus as the cause of diseases classified elsewhere: B95.7

## 2015-11-24 HISTORY — DX: Hypotension, unspecified: I95.9

## 2015-11-24 MED ORDER — RIFAMPIN 300 MG PO CAPS: 300.0000 mg | ORAL_CAPSULE | Freq: Two times a day (BID) | ORAL | 2 refills | Status: AC
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
## 2015-11-24 NOTE — Telephone Encounter (Signed)
Patient's IV antbiotics completed on Sunday, 11/22/15.  [facility] RN drew standard labs on Monday, 11/23/15.  Results not back today.  Patient has an appointment with Dr. Tommy Medal this afternoon.

## 2015-11-25 DIAGNOSIS — Z452 Encounter for adjustment and management of vascular access device: Secondary | ICD-10-CM | POA: Diagnosis not present

## 2015-11-25 DIAGNOSIS — N400 Enlarged prostate without lower urinary tract symptoms: Secondary | ICD-10-CM | POA: Diagnosis not present

## 2015-11-25 DIAGNOSIS — F331 Major depressive disorder, recurrent, moderate: Secondary | ICD-10-CM | POA: Diagnosis not present

## 2015-11-25 DIAGNOSIS — Z7984 Long term (current) use of oral hypoglycemic drugs: Secondary | ICD-10-CM | POA: Diagnosis not present

## 2015-11-25 DIAGNOSIS — I509 Heart failure, unspecified: Secondary | ICD-10-CM | POA: Diagnosis not present

## 2015-11-25 DIAGNOSIS — E119 Type 2 diabetes mellitus without complications: Secondary | ICD-10-CM | POA: Diagnosis not present

## 2015-11-25 DIAGNOSIS — Z7982 Long term (current) use of aspirin: Secondary | ICD-10-CM | POA: Diagnosis not present

## 2015-11-25 DIAGNOSIS — Z5181 Encounter for therapeutic drug level monitoring: Secondary | ICD-10-CM | POA: Diagnosis not present

## 2015-11-25 DIAGNOSIS — T8454XA Infection and inflammatory reaction due to internal left knee prosthesis, initial encounter: Secondary | ICD-10-CM | POA: Diagnosis not present

## 2015-11-25 DIAGNOSIS — Z7902 Long term (current) use of antithrombotics/antiplatelets: Secondary | ICD-10-CM | POA: Diagnosis not present

## 2015-11-25 DIAGNOSIS — I110 Hypertensive heart disease with heart failure: Secondary | ICD-10-CM | POA: Diagnosis not present

## 2015-11-25 DIAGNOSIS — Z792 Long term (current) use of antibiotics: Secondary | ICD-10-CM | POA: Diagnosis not present

## 2015-11-25 DIAGNOSIS — F411 Generalized anxiety disorder: Secondary | ICD-10-CM | POA: Diagnosis not present

## 2015-11-25 DIAGNOSIS — E785 Hyperlipidemia, unspecified: Secondary | ICD-10-CM | POA: Diagnosis not present

## 2015-11-26 ENCOUNTER — Encounter: Payer: Self-pay | Admitting: *Deleted

## 2015-11-26 ENCOUNTER — Other Ambulatory Visit: Payer: Self-pay | Admitting: *Deleted

## 2015-11-26 DIAGNOSIS — R2689 Other abnormalities of gait and mobility: Secondary | ICD-10-CM | POA: Diagnosis not present

## 2015-11-26 DIAGNOSIS — M6281 Muscle weakness (generalized): Secondary | ICD-10-CM | POA: Diagnosis not present

## 2015-11-26 DIAGNOSIS — I2510 Atherosclerotic heart disease of native coronary artery without angina pectoris: Secondary | ICD-10-CM | POA: Diagnosis not present

## 2015-11-26 DIAGNOSIS — T8454XD Infection and inflammatory reaction due to internal left knee prosthesis, subsequent encounter: Secondary | ICD-10-CM | POA: Diagnosis not present

## 2015-11-26 NOTE — Patient Outreach (Signed)
[facility]) Care Management  11/26/2015  MAYWOOD WARFEL September 02, 1945 [id number]   I spoke with Mrs. Boutelle today. She reports that Mr. Damme is resting.   DME - Mrs. Gale has been unable to go to the [facility] retail store to check on the wheelchair exchange. Her home health nursing is helping to make arrangements to have the wheelchair delivered on Friday.   Antibiotics - Mrs. Vencill reports that Mr. Fayson saw the infectious disease doctor yesterday and Mr. Aulet is to stay on IV antibiotics. A delivery was made to the home and with the help and instruction of the home health nurse, she is administering the antibiotics as prescribed. His home health nurse is next scheduled to come on Tuesday.   Long term planning - Mrs. Weyer says they are uncertain about whether Mr. Gravett will require any further surgical procedures and at this time know they are to continue antibiotics and close surveillance by the medical team.   Plan:   HHRN will continue weekly visits.   Mrs. Texeira will continue administration of IV abx to Mr. Ting and will call the home health nurse for any questions/needs related to the antibiotics.   Mr. Twing will attend all upcoming provider appointments and will call in advance if any changes to appointment dates/times need to be made.   I will reach out to Mr. Poovey again next week to follow up on his ID appointment.    [place] Management  [phone]

## 2015-11-27 DIAGNOSIS — T8454XA Infection and inflammatory reaction due to internal left knee prosthesis, initial encounter: Secondary | ICD-10-CM | POA: Diagnosis not present

## 2015-11-27 DIAGNOSIS — Z7902 Long term (current) use of antithrombotics/antiplatelets: Secondary | ICD-10-CM | POA: Diagnosis not present

## 2015-11-27 DIAGNOSIS — Z7982 Long term (current) use of aspirin: Secondary | ICD-10-CM | POA: Diagnosis not present

## 2015-11-27 DIAGNOSIS — Z792 Long term (current) use of antibiotics: Secondary | ICD-10-CM | POA: Diagnosis not present

## 2015-11-27 DIAGNOSIS — F411 Generalized anxiety disorder: Secondary | ICD-10-CM | POA: Diagnosis not present

## 2015-11-27 DIAGNOSIS — F331 Major depressive disorder, recurrent, moderate: Secondary | ICD-10-CM | POA: Diagnosis not present

## 2015-11-27 DIAGNOSIS — I509 Heart failure, unspecified: Secondary | ICD-10-CM | POA: Diagnosis not present

## 2015-11-27 DIAGNOSIS — Z452 Encounter for adjustment and management of vascular access device: Secondary | ICD-10-CM | POA: Diagnosis not present

## 2015-11-27 DIAGNOSIS — E785 Hyperlipidemia, unspecified: Secondary | ICD-10-CM | POA: Diagnosis not present

## 2015-11-27 DIAGNOSIS — N400 Enlarged prostate without lower urinary tract symptoms: Secondary | ICD-10-CM | POA: Diagnosis not present

## 2015-11-27 DIAGNOSIS — Z7984 Long term (current) use of oral hypoglycemic drugs: Secondary | ICD-10-CM | POA: Diagnosis not present

## 2015-11-27 DIAGNOSIS — Z5181 Encounter for therapeutic drug level monitoring: Secondary | ICD-10-CM | POA: Diagnosis not present

## 2015-11-27 DIAGNOSIS — I110 Hypertensive heart disease with heart failure: Secondary | ICD-10-CM | POA: Diagnosis not present

## 2015-11-27 DIAGNOSIS — E119 Type 2 diabetes mellitus without complications: Secondary | ICD-10-CM | POA: Diagnosis not present

## 2015-12-01 ENCOUNTER — Encounter: Payer: Self-pay | Admitting: Infectious Disease

## 2015-12-01 DIAGNOSIS — Z7984 Long term (current) use of oral hypoglycemic drugs: Secondary | ICD-10-CM | POA: Diagnosis not present

## 2015-12-01 DIAGNOSIS — Z5181 Encounter for therapeutic drug level monitoring: Secondary | ICD-10-CM | POA: Diagnosis not present

## 2015-12-01 DIAGNOSIS — T8454XA Infection and inflammatory reaction due to internal left knee prosthesis, initial encounter: Secondary | ICD-10-CM | POA: Diagnosis not present

## 2015-12-06 DIAGNOSIS — T8454XD Infection and inflammatory reaction due to internal left knee prosthesis, subsequent encounter: Secondary | ICD-10-CM | POA: Diagnosis not present

## 2015-12-08 ENCOUNTER — Encounter: Payer: Self-pay | Admitting: Infectious Disease

## 2015-12-08 ENCOUNTER — Other Ambulatory Visit: Payer: Self-pay | Admitting: Licensed Clinical Social Worker

## 2015-12-08 DIAGNOSIS — Z7984 Long term (current) use of oral hypoglycemic drugs: Secondary | ICD-10-CM | POA: Diagnosis not present

## 2015-12-08 DIAGNOSIS — Z5181 Encounter for therapeutic drug level monitoring: Secondary | ICD-10-CM | POA: Diagnosis not present

## 2015-12-08 DIAGNOSIS — T8454XA Infection and inflammatory reaction due to internal left knee prosthesis, initial encounter: Secondary | ICD-10-CM | POA: Diagnosis not present

## 2015-12-08 NOTE — Patient Outreach (Signed)
Assessment:  CSW called client on 12/08/15 and spoke via phone with client on 12/08/15. CSW verified client identity. CSW received verbal permission from client on 12/08/15 for CSW to speak with client about current client needs.  Client said he has appointment at his home today with an RN from [facility].  Client said he now has a wheelchair he can use well and it works well in his home. He has a walker to use also for ambulation needs. He uses walker regularly to help him ambulate. He said he has his prescribed medications and is taking medications as prescribed. He said he has appointment with Dr. Jenny Reichmann in November of 2017.Marland Kitchen He said he is eating well and sleeping well. Client had been receiving physical therapy sessions with [facility].  He said he had completed in home physical therapy sessions with [facility] about one week ago. He said he now receives only in home RN support with [facility].He still has pain in his left knee area.  He is taking pain medication as prescribed. Client said he had appointment recently with Dr. Jenny Reichmann and thus will not see Dr. Jenny Reichmann again for next appointment until November of 2017. Client has support from his wife and family.  CSW spoke with client about client care plan. CSW encouraged client to attend all scheduled client medical appointments in next 30 days. Client agreed to work towards this care plan goal. CSW encouraged client to call CSW as needed at [phone] to discuss social work needs of client. CSW thanked client for phone call with CSW on 12/08/15.    Plan:  Client to attend all scheduled client medical appointments in next 30 days.  CSW to collaborate with RN Janalyn Shy in monitoring needs of client.  CSW to call client in 4 weeks to assess client needs.  Norva Riffle.Drae Mitzel MSW, LCSW Licensed Clinical Social Worker [facility] Care Management [phone]

## 2015-12-11 ENCOUNTER — Telehealth: Payer: Self-pay

## 2015-12-11 ENCOUNTER — Other Ambulatory Visit: Payer: Self-pay | Admitting: Licensed Clinical Social Worker

## 2015-12-11 DIAGNOSIS — M6281 Muscle weakness (generalized): Secondary | ICD-10-CM | POA: Diagnosis not present

## 2015-12-11 DIAGNOSIS — R2689 Other abnormalities of gait and mobility: Secondary | ICD-10-CM | POA: Diagnosis not present

## 2015-12-11 DIAGNOSIS — T8454XD Infection and inflammatory reaction due to internal left knee prosthesis, subsequent encounter: Secondary | ICD-10-CM | POA: Diagnosis not present

## 2015-12-11 DIAGNOSIS — I2510 Atherosclerotic heart disease of native coronary artery without angina pectoris: Secondary | ICD-10-CM | POA: Diagnosis not present

## 2015-12-11 MED ORDER — CLOTRIMAZOLE-BETAMETHASONE 1-0.05 % EX CREA: 1.0000 "application " | TOPICAL_CREAM | Freq: Every day | CUTANEOUS | 0 refills | Status: DC | PRN

## 2015-12-11 NOTE — Patient Outreach (Signed)
Assessment:  CSW updated Quality of Life Assessment for client on 12/11/15  Plan:  CSW to call client as scheduled to assess client needs.  Norva Riffle.Calixto Pavel MSW, LCSW Licensed Clinical Social Worker [facility] Care Management [phone]

## 2015-12-11 NOTE — Telephone Encounter (Signed)
Refill sent to [facility].../lmb 

## 2015-12-11 NOTE — Telephone Encounter (Signed)
clotrimazole-betamethasone (LOTRISONE) cream   Patient is requesting a refill on this medication.  Please follow up, Thank you.

## 2015-12-15 ENCOUNTER — Other Ambulatory Visit: Payer: Self-pay | Admitting: *Deleted

## 2015-12-15 ENCOUNTER — Encounter: Payer: Self-pay | Admitting: *Deleted

## 2015-12-15 ENCOUNTER — Encounter: Payer: Self-pay | Admitting: Infectious Disease

## 2015-12-15 DIAGNOSIS — Z5181 Encounter for therapeutic drug level monitoring: Secondary | ICD-10-CM | POA: Diagnosis not present

## 2015-12-15 DIAGNOSIS — Z7984 Long term (current) use of oral hypoglycemic drugs: Secondary | ICD-10-CM | POA: Diagnosis not present

## 2015-12-15 DIAGNOSIS — T8454XA Infection and inflammatory reaction due to internal left knee prosthesis, initial encounter: Secondary | ICD-10-CM | POA: Diagnosis not present

## 2015-12-15 NOTE — Patient Outreach (Signed)
[facility]) Care Management  12/15/2015  TREYSHAUN HASBUN Mar 15, 1946 [id number]  Unable to reach Mr. Szekely by phone this afternoon. HIPPA compliant voice message left requesting a return call.   Plan: I will reach out to Mr. Sphar by phone again next week.    [place] Management  [phone]

## 2015-12-16 ENCOUNTER — Other Ambulatory Visit: Payer: Self-pay | Admitting: Cardiology

## 2015-12-16 DIAGNOSIS — E785 Hyperlipidemia, unspecified: Secondary | ICD-10-CM

## 2015-12-22 DIAGNOSIS — Z5181 Encounter for therapeutic drug level monitoring: Secondary | ICD-10-CM | POA: Diagnosis not present

## 2015-12-22 DIAGNOSIS — T8454XA Infection and inflammatory reaction due to internal left knee prosthesis, initial encounter: Secondary | ICD-10-CM | POA: Diagnosis not present

## 2015-12-22 DIAGNOSIS — Z7984 Long term (current) use of oral hypoglycemic drugs: Secondary | ICD-10-CM | POA: Diagnosis not present

## 2015-12-23 ENCOUNTER — Ambulatory Visit (INDEPENDENT_AMBULATORY_CARE_PROVIDER_SITE_OTHER): Payer: Medicare Other | Admitting: Infectious Disease

## 2015-12-23 ENCOUNTER — Encounter: Payer: Self-pay | Admitting: Infectious Disease

## 2015-12-23 VITALS — BP 110/56 | HR 69 | Temp 97.9°F | Ht 70.0 in | Wt 248.0 lb

## 2015-12-23 DIAGNOSIS — I2510 Atherosclerotic heart disease of native coronary artery without angina pectoris: Secondary | ICD-10-CM | POA: Diagnosis not present

## 2015-12-23 DIAGNOSIS — B957 Other staphylococcus as the cause of diseases classified elsewhere: Secondary | ICD-10-CM

## 2015-12-23 DIAGNOSIS — T8454XD Infection and inflammatory reaction due to internal left knee prosthesis, subsequent encounter: Secondary | ICD-10-CM | POA: Diagnosis not present

## 2015-12-23 DIAGNOSIS — L03031 Cellulitis of right toe: Secondary | ICD-10-CM | POA: Diagnosis not present

## 2015-12-23 DIAGNOSIS — R2689 Other abnormalities of gait and mobility: Secondary | ICD-10-CM | POA: Diagnosis not present

## 2015-12-23 DIAGNOSIS — T8450XD Infection and inflammatory reaction due to unspecified internal joint prosthesis, subsequent encounter: Secondary | ICD-10-CM | POA: Diagnosis not present

## 2015-12-23 DIAGNOSIS — S80252D Superficial foreign body, left knee, subsequent encounter: Secondary | ICD-10-CM | POA: Diagnosis not present

## 2015-12-23 DIAGNOSIS — L089 Local infection of the skin and subcutaneous tissue, unspecified: Secondary | ICD-10-CM

## 2015-12-23 DIAGNOSIS — M6281 Muscle weakness (generalized): Secondary | ICD-10-CM | POA: Diagnosis not present

## 2015-12-23 HISTORY — DX: Cellulitis of right toe: L03.031
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
## 2015-12-25 ENCOUNTER — Other Ambulatory Visit: Payer: Self-pay | Admitting: *Deleted

## 2015-12-25 NOTE — Patient Outreach (Signed)
[facility]) Care Management  12/25/2015  PEIGHTON SPERRAZZA 03/13/1946 [id number]   Unable to reach Mr. Welcome or his wife by phone this afternoon.   It is noted that Dr. Tommy Medal, Infectious Disease saw Mr. Bense in the office and made note of his ongoing concern that Mr. Malizia pain has not improved even after third surgery with arthroplasty and aggressive antibiotic dosing with no change in inflammatory markers. Dr. Tommy Medal referred Mr. Kubik back to Dr. Ninfa Linden who will see him in the office next week on October 3. Dr. Drucilla Schmidt and Mr. Ligman discussed the possibility of fusion or amputation in the future. Regarding paronychia, plan is to watch carefully as purulence seems to be resolving.   Plan: I will reach out to Mr. Kelsch again next week after his appointment with Dr. Rush Farmer to follow up on plans and address questions and care coordination needs.    [place] Management  [phone]

## 2015-12-28 ENCOUNTER — Ambulatory Visit: Payer: Medicare Other | Admitting: Cardiology

## 2015-12-28 ENCOUNTER — Ambulatory Visit (INDEPENDENT_AMBULATORY_CARE_PROVIDER_SITE_OTHER): Payer: Medicare Other | Admitting: Orthopaedic Surgery

## 2015-12-28 DIAGNOSIS — M00862 Arthritis due to other bacteria, left knee: Secondary | ICD-10-CM

## 2015-12-28 DIAGNOSIS — M12562 Traumatic arthropathy, left knee: Secondary | ICD-10-CM | POA: Diagnosis not present

## 2015-12-28 DIAGNOSIS — T8454XD Infection and inflammatory reaction due to internal left knee prosthesis, subsequent encounter: Secondary | ICD-10-CM | POA: Diagnosis not present

## 2015-12-29 ENCOUNTER — Encounter: Payer: Self-pay | Admitting: Infectious Disease

## 2015-12-29 DIAGNOSIS — Z7984 Long term (current) use of oral hypoglycemic drugs: Secondary | ICD-10-CM | POA: Diagnosis not present

## 2015-12-29 DIAGNOSIS — Z5181 Encounter for therapeutic drug level monitoring: Secondary | ICD-10-CM | POA: Diagnosis not present

## 2015-12-29 DIAGNOSIS — T8454XA Infection and inflammatory reaction due to internal left knee prosthesis, initial encounter: Secondary | ICD-10-CM | POA: Diagnosis not present

## 2015-12-31 ENCOUNTER — Other Ambulatory Visit: Payer: Self-pay | Admitting: *Deleted

## 2015-12-31 ENCOUNTER — Telehealth: Payer: Self-pay | Admitting: *Deleted

## 2015-12-31 NOTE — Telephone Encounter (Signed)
He needs to continue on IV abx through surgery and for 6 more weeks afterwards

## 2015-12-31 NOTE — Patient Outreach (Signed)
[facility]) Care Management  12/31/2015  Steven Charles [birth year] [id number]  Unable to reach [name] by phone today.   Plan: I will call him again on Monday to discuss pending surgery and current treatment plan with him/his wife.    [place] Management  [phone]

## 2015-12-31 NOTE — Telephone Encounter (Signed)
Patient saw orthopedist 12/28/15.  Knee surgery scheduled for 01/12/16.  Dr. Tommy Medal do you want to continue IV ABX and how long? Current last day of ABX 01/06/16.  Please advise.

## 2016-01-04 ENCOUNTER — Other Ambulatory Visit: Payer: Self-pay | Admitting: *Deleted

## 2016-01-04 ENCOUNTER — Encounter: Payer: Self-pay | Admitting: *Deleted

## 2016-01-04 NOTE — Telephone Encounter (Signed)
Repeated Dr. Lucianne Lei Dam's order to Lenna Sciara, [place], [facility] Pharmacist verbalized back the order.  Melissa stated that she would alert the [facility] that the patient is having surgery 01/12/16 to coordinate discharge and IV ABX orders.

## 2016-01-04 NOTE — Patient Outreach (Signed)
[facility]) Care Management  01/04/2016  Steven Charles 01-Sep-1945 [id number]  [name]. Steven Charles is a 70 year old patient referred to [facility] Management after his recent hospital admission 08/28/15-08/31/15 for I&D and revision of the tibial component of infected left total knee arthroplasty. [name] had placement of antibiotic beads, wound VAC, and a PICC line for 6 weeks IV antibiotics.  [name] was newly diagnosed with DM Type II during this admission. He was seen by the dietician and diabetes coordinator during his hospitalization and has followed up with his primary care provider since for continued education and management of diabetes. He has been checking cbg's at home twice daily and readings, per his report, have been in the 130-140 range.   [name] was discharged to home with home health services including nursing and physical therapy. A rolling walker was provided to him prior to hospital discharge. [name] returned for surgical revision of his total knee replacement, was subsequently discharged to [facility] for short term rehabilitation. He discharged to home and has been with home health physical therapy and nursing via [facility] and has been receiving IV antibiotics.   It is noted that Dr. Tommy Medal, Infectious Disease saw [name] in the office and made note of his ongoing concern that [name] pain has not improved even after third surgery with arthroplasty and aggressive antibiotic dosing with no change in inflammatory markers. Dr. Tommy Medal referred [name] back to Dr. Ninfa Linden who saw him in the office on 12/29/15. [name] was subsequently scheduled for surgery on 01/12/16. He is to continue on IV antibiotics until the time of surgery and for 6 weeks thereafter.   I reached out to [name] today and spoke with him at length regarding his progress and upcoming surgery. He spoke very freely with me about how he feels "frustrated" and  "just can't get my head around the idea of taking my leg off." He has a good understanding that the planned surgery is for another knee revision but says his doctors have discussed possible amputation with him. He says he can't understand whey someone can't "just put a brace on my knee so I can get up and walk around."   [name] is set to go to his pre-op appointment on Friday and has his instructions. His wife will take him to the appointment. [name] continues with IV antibiotics and anticipates that he will need them 6-8 weeks after surgery.   I offered my contact information again to [name], letting him know that the Care Management team is here to provide support to him in anticipation of the upcoming surgery and thereafter. He expressed his thanks and took my contact information.   Plan: I will reach out to [name] again next week after his pre-op appointment but before his scheduled surgical procedure.    [place] Management  [phone]

## 2016-01-05 ENCOUNTER — Other Ambulatory Visit: Payer: Self-pay | Admitting: Orthopaedic Surgery

## 2016-01-05 ENCOUNTER — Inpatient Hospital Stay (HOSPITAL_COMMUNITY): Admission: RE | Admit: 2016-01-05 | Payer: Medicare Other | Source: Ambulatory Visit

## 2016-01-05 ENCOUNTER — Encounter: Payer: Self-pay | Admitting: Infectious Disease

## 2016-01-05 DIAGNOSIS — Z5181 Encounter for therapeutic drug level monitoring: Secondary | ICD-10-CM | POA: Diagnosis not present

## 2016-01-05 DIAGNOSIS — T8454XD Infection and inflammatory reaction due to internal left knee prosthesis, subsequent encounter: Secondary | ICD-10-CM | POA: Diagnosis not present

## 2016-01-05 DIAGNOSIS — T8454XA Infection and inflammatory reaction due to internal left knee prosthesis, initial encounter: Secondary | ICD-10-CM | POA: Diagnosis not present

## 2016-01-05 DIAGNOSIS — Z7984 Long term (current) use of oral hypoglycemic drugs: Secondary | ICD-10-CM | POA: Diagnosis not present

## 2016-01-06 ENCOUNTER — Ambulatory Visit (INDEPENDENT_AMBULATORY_CARE_PROVIDER_SITE_OTHER): Payer: Self-pay | Admitting: Orthopaedic Surgery

## 2016-01-07 ENCOUNTER — Ambulatory Visit: Payer: Self-pay | Admitting: Licensed Clinical Social Worker

## 2016-01-07 DIAGNOSIS — M6281 Muscle weakness (generalized): Secondary | ICD-10-CM | POA: Diagnosis not present

## 2016-01-07 DIAGNOSIS — I2510 Atherosclerotic heart disease of native coronary artery without angina pectoris: Secondary | ICD-10-CM | POA: Diagnosis not present

## 2016-01-07 DIAGNOSIS — R2689 Other abnormalities of gait and mobility: Secondary | ICD-10-CM | POA: Diagnosis not present

## 2016-01-07 DIAGNOSIS — T8454XD Infection and inflammatory reaction due to internal left knee prosthesis, subsequent encounter: Secondary | ICD-10-CM | POA: Diagnosis not present

## 2016-01-07 NOTE — Pre-Procedure Instructions (Signed)
Steven Charles  01/07/2016     Your procedure is scheduled on : Tuesday January 12, 2016 at 3:00 PM.  Report to [facility] Admitting at 1:00 PM.  Call this number if you have problems the morning of surgery: [phone]    Remember:  Do not eat food or drink liquids after midnight.  Take these medicines the morning of surgery with A SIP OF WATER : Carvedilol (Coreg), Fluoxetine (Prozac), Gabapentin (Neurontin), Lorazepam (Ativan) if needed, Finasteride (Proscar), Oxycodone if needed, Pantoprazole (Protonix), Rifampin (Rifadin)   7 days prior to surgery STOP taking any Aspirin, Aleve, Naproxen, Ibuprofen, Motrin, Advil, Goody's, BC's, all herbal medications, fish oil, and all vitamins     How to Manage Your Diabetes Before and After Surgery  Why is it important to control my blood sugar before and after surgery? . Improving blood sugar levels before and after surgery helps healing and can limit problems. . A way of improving blood sugar control is eating a healthy diet by: o  Eating less sugar and carbohydrates o  Increasing activity/exercise o  Talking with your doctor about reaching your blood sugar goals . High blood sugars (greater than 180 mg/dL) can raise your risk of infections and slow your recovery, so you will need to focus on controlling your diabetes during the weeks before surgery. . Make sure that the doctor who takes care of your diabetes knows about your planned surgery including the date and location.  How do I manage my blood sugar before surgery? . Check your blood sugar at least 4 times a day, starting 2 days before surgery, to make sure that the level is not too high or low. o Check your blood sugar the morning of your surgery when you wake up and every 2 hours until you get to the Short Stay unit. . If your blood sugar is less than 70 mg/dL, you will need to treat for low blood sugar: o Do not take insulin. o Treat a low blood sugar (less than 70  mg/dL) with  cup of clear juice (cranberry or apple), 4 glucose tablets, OR glucose gel. o Recheck blood sugar in 15 minutes after treatment (to make sure it is greater than 70 mg/dL). If your blood sugar is not greater than 70 mg/dL on recheck, call [phone] for further instructions. . Report your blood sugar to the short stay nurse when you get to Short Stay.  . If you are admitted to the hospital after surgery: o Your blood sugar will be checked by the staff and you will probably be given insulin after surgery (instead of oral diabetes medicines) to make sure you have good blood sugar levels. o The goal for blood sugar control after surgery is 80-180 mg/dL.      WHAT DO I DO ABOUT MY DIABETES MEDICATION?   Marland Kitchen Do not take oral diabetes medicines (pills) the morning of surgery.  . THE NIGHT BEFORE SURGERY, take ___________ units of ___________insulin.       . THE MORNING OF SURGERY, take _____________ units of __________insulin.  . The day of surgery, do not take other diabetes injectables, including Byetta (exenatide), Bydureon (exenatide ER), Victoza (liraglutide), or Trulicity (dulaglutide).  . If your CBG is greater than 220 mg/dL, you may take  of your sliding scale (correction) dose of insulin.   Reviewed and Endorsed by [facility] Patient Education Committee, August 2015   Do not wear jewelry.  Do not wear lotions, powders, cologne, or  deoderant.  Men may shave face and neck.  Do not bring valuables to the hospital.  [facility] is not responsible for any belongings or valuables.  Contacts, dentures or bridgework may not be worn into surgery.  Leave your suitcase in the car.  After surgery it may be brought to your room.  For patients admitted to the hospital, discharge time will be determined by your treatment team.  Patients discharged the day of surgery will not be allowed to drive home.   Name and phone number of your driver:    Special instructions:   [place]- Preparing For Surgery  Before surgery, you can play an important role. Because skin is not sterile, your skin needs to be as free of germs as possible. You can reduce the number of germs on your skin by washing with CHG (chlorahexidine gluconate) Soap before surgery.  CHG is an antiseptic cleaner which kills germs and bonds with the skin to continue killing germs even after washing.  Please do not use if you have an allergy to CHG or antibacterial soaps. If your skin becomes reddened/irritated stop using the CHG.  Do not shave (including legs and underarms) for at least 48 hours prior to first CHG shower. It is OK to shave your face.  Please follow these instructions carefully.   1. Shower the NIGHT BEFORE SURGERY and the MORNING OF SURGERY with CHG.   2. If you chose to wash your hair, wash your hair first as usual with your normal shampoo.  3. After you shampoo, rinse your hair and body thoroughly to remove the shampoo.  4. Use CHG as you would any other liquid soap. You can apply CHG directly to the skin and wash gently with a scrungie or a clean washcloth.   5. Apply the CHG Soap to your body ONLY FROM THE NECK DOWN.  Do not use on open wounds or open sores. Avoid contact with your eyes, ears, mouth and genitals (private parts). Wash genitals (private parts) with your normal soap.  6. Wash thoroughly, paying special attention to the area where your surgery will be performed.  7. Thoroughly rinse your body with warm water from the neck down.  8. DO NOT shower/wash with your normal soap after using and rinsing off the CHG Soap.  9. Pat yourself dry with a CLEAN TOWEL.   10. Wear CLEAN PAJAMAS   11. Place CLEAN SHEETS on your bed the night of your first shower and DO NOT SLEEP WITH PETS.    Day of Surgery: Do not apply any deodorants/lotions. Please wear clean clothes to the hospital/surgery center.     Please read over the following fact sheets that you were  given.

## 2016-01-08 ENCOUNTER — Ambulatory Visit (HOSPITAL_COMMUNITY)
Admission: RE | Admit: 2016-01-08 | Discharge: 2016-01-08 | Disposition: A | Discharge status: Home / Self Care | Payer: Medicare Other | Source: Ambulatory Visit | Attending: Anesthesiology | Admitting: Anesthesiology

## 2016-01-08 ENCOUNTER — Encounter (HOSPITAL_COMMUNITY)
Admission: RE | Admit: 2016-01-08 | Discharge: 2016-01-08 | Disposition: A | Discharge status: Home / Self Care | Payer: Medicare Other | Source: Ambulatory Visit | Attending: Orthopaedic Surgery | Admitting: Orthopaedic Surgery

## 2016-01-08 ENCOUNTER — Encounter (HOSPITAL_COMMUNITY): Payer: Self-pay

## 2016-01-08 DIAGNOSIS — R059 Cough, unspecified: Secondary | ICD-10-CM

## 2016-01-08 DIAGNOSIS — R05 Cough: Secondary | ICD-10-CM | POA: Insufficient documentation

## 2016-01-08 DIAGNOSIS — E119 Type 2 diabetes mellitus without complications: Secondary | ICD-10-CM | POA: Insufficient documentation

## 2016-01-08 DIAGNOSIS — Z01818 Encounter for other preprocedural examination: Secondary | ICD-10-CM | POA: Diagnosis not present

## 2016-01-08 HISTORY — DX: Other complications of anesthesia, initial encounter: T88.59XA

## 2016-01-08 HISTORY — DX: Adverse effect of unspecified anesthetic, initial encounter: T41.45XA

## 2016-01-08 LAB — BASIC METABOLIC PANEL
ANION GAP: 9 (ref 5–15)
BUN: 15 mg/dL (ref 6–20)
CHLORIDE: 104 mmol/L (ref 101–111)
CO2: 25 mmol/L (ref 22–32)
CREATININE: 0.93 mg/dL (ref 0.61–1.24)
Calcium: 10.1 mg/dL (ref 8.9–10.3)
GFR calc non Af Amer: >60 mL/min (ref >60)
Glucose, Bld: 95 mg/dL (ref 65–99)
Potassium: 3.9 mmol/L (ref 3.5–5.1)
SODIUM: 138 mmol/L (ref 135–145)

## 2016-01-08 LAB — CBC
HCT: 34.3 % — ABNORMAL LOW (ref 39.0–52.0)
HEMOGLOBIN: 11.1 g/dL — AB (ref 13.0–17.0)
MCH: 27.9 pg (ref 26.0–34.0)
MCHC: 32.4 g/dL (ref 30.0–36.0)
MCV: 86.2 fL (ref 78.0–100.0)
PLATELETS: 174 10*3/uL (ref 150–400)
RBC: 3.98 MIL/uL — AB (ref 4.22–5.81)
RDW: 13.9 % (ref 11.5–15.5)
WBC: 6.5 10*3/uL (ref 4.0–10.5)

## 2016-01-08 NOTE — Progress Notes (Signed)
Anesthesia Chart Review:  Pt is a 70 year old male scheduled for irrigation and debridement left knee, placement of antibiotic cement spacer, reimplantation of cemented spacer on 01/12/2016 with Jean Rosenthal, M.D.  - Cardiologist is [name], MD, last office visit 07/18/14, 1 year f/u recommended.  - PCP is Cathlean Cower, MD.   PMH includes:  CAD (s/p CABG 2007), MI, AAA, CHF, HTN, hyperlipidemia, OSA (no CPAP), impaired glucose tolerance (has been on metformin in the past; it was stopped for improved diet control). Former smoker. BMI 35.5  Medications include: ASA, lipitor carvedilol, lasix, lisinopril, protonix, potassium, rifampin  Preoperative labs reviewed. HgbA1c pending. Glucose 95  CXR 01/08/16: No active cardiopulmonary disease.  Stable cardiomegaly.  EKG 08/22/15: Sinus rhythm. Inferior Q waves noted. No significant change since last tracing. Inferior Q waves present on EKG's dating back to 12/25/12.   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 Wall thickness was increased increased in a pattern of mild to moderate LVH. Systolic function was normal. The estimated ejection fraction was in the range of 50% to 55%. Regional wall motion abnormalities cannot be excluded. Features are consistent with a pseudonormal left ventricular filling pattern, with concomitant abnormal relaxation and increased filling pressure (grade 2 diastolic dysfunction). Doppler parameters are consistent with high ventricular filling pressure. - Aortic valve: There was trivial regurgitation. - Aortic root: The aortic root was mildly dilated. - Mitral valve: Calcified annulus. - Left atrium: The atrium was moderately dilated. - Right atrium: The atrium was mildly dilated. - Impressions: Normal LV function; cannot R/O focal wall motion abnormality; mild to moderate LVH; grade 2 diastolic dysfunction; biatrial  enlargement; trace AI.  Nuclear stress test 01/07/13: The study report from November 2011 suggests some inferior ischemia. The end-diastolic volume was A999333 mL and the ejection fraction was 59% at that time. The ventricle is larger now by this study. The end-diastolic volume is 0000000 mL. Ejection fraction is now 43%. There may be mild inferior scar. There is no marked ischemia. This is a low/moderate risk scan. LV Ejection Fraction: 43%. LV Wall Motion: Mild global hypokinesis. Slightly more marked in the inferior wall. - Dr. Stanford Breed recommended medical therapy.   Pt has tolerated excision L total knee arthroplasty 10/13/15 and poly exchange L knee 08/28/15. Pt is overdue to see cardiology (was due last April), but no CV sx documented at PAT. If no changes, I anticipate pt can proceed with surgery as scheduled.   Willeen Cass, FNP-BC [facility]/Anesthesiology Phone: [phone] 01/08/2016 4:12 PM

## 2016-01-08 NOTE — Progress Notes (Signed)
PCP - Cathlean Cower Cardiologist - [place] - needs clearance  Chest x-ray - 01/08/16 EKG - 08/22/15 Stress Test - 01/07/13 ECHO - 01/09/14 Cardiac Cath - 2007  Patient is positive for sleep apnea but no CPAP Patient has a current cough at pat appointment  Right arm PICC - double lumen  Needs cardiac clearance - sending to anesthesia for review   Patient denies shortness of breath, fever, and chest pain at PAT appointment

## 2016-01-09 LAB — HEMOGLOBIN A1C
HEMOGLOBIN A1C: 5.8 % — AB (ref 4.8–5.6)
MEAN PLASMA GLUCOSE: 120 mg/dL

## 2016-01-11 ENCOUNTER — Encounter: Payer: Self-pay | Admitting: *Deleted

## 2016-01-11 ENCOUNTER — Other Ambulatory Visit: Payer: Self-pay | Admitting: *Deleted

## 2016-01-11 MED ORDER — DEXTROSE 5 % IV SOLN
3.0000 g | INTRAVENOUS | Status: AC
  Administered 2016-01-12: 3 g via INTRAVENOUS
  Filled 2016-01-11: qty 3000

## 2016-01-11 NOTE — Patient Outreach (Signed)
[facility]) Care Management  01/11/2016  Steven Charles 06/12/1945 [id number]  Steven Charles. [place] is a 70 year old patient referred to [facility] after his recent hospital admission 08/28/15-08/31/15 for I&D and revision of the tibial component of infected left total knee arthroplasty. Steven Charles had placement of antibiotic beads, wound VAC, and a PICC line for 6 weeks IV antibiotics. He was newly diagnosed with DM Type II during this admission.  Steven Charles was discharged to home with home health services but had to return to the hospital for surgical revision and had a short term SNF stay for rehab. He has since been discharged to home and has been with home health physical therapy and nursing via [facility], receiving IV antibiotics and wound care.  Steven Charles (infectious disease) referred Steven Charles back to Steven Charles who saw him in the office on 12/29/15. Steven Charles was subsequently scheduled for surgery on 01/12/16. He is to continue on IV antibiotics until the time of surgery and for 6 weeks thereafter.   I reached out to Steven Charles today to follow up on his pre-op appointment that he attended on Friday. He says he feels well informed and ready to go to the OR tomorrow. He is still quite concerned about the possibility of amputation at some time in the future.   Plan: I explained to Steven Charles that our hospital liaisons will follow his progress while he is inpatient and I will reach out to him again at discharge to assist with transition of care assessments and care coordination.    [facility]  [phone]

## 2016-01-12 ENCOUNTER — Inpatient Hospital Stay (HOSPITAL_COMMUNITY): Payer: Medicare Other | Admitting: Certified Registered Nurse Anesthetist

## 2016-01-12 ENCOUNTER — Inpatient Hospital Stay (HOSPITAL_COMMUNITY): Payer: Medicare Other | Admitting: Emergency Medicine

## 2016-01-12 ENCOUNTER — Encounter (HOSPITAL_COMMUNITY)
Admission: RE | Disposition: A | Discharge status: Home Health | Payer: Self-pay | Source: Ambulatory Visit | Attending: Orthopaedic Surgery

## 2016-01-12 ENCOUNTER — Encounter (HOSPITAL_COMMUNITY): Payer: Self-pay | Admitting: Certified Registered Nurse Anesthetist

## 2016-01-12 ENCOUNTER — Inpatient Hospital Stay (HOSPITAL_COMMUNITY)
Admission: RE | Admit: 2016-01-12 | Discharge: 2016-01-19 | DRG: 487 | Disposition: A | Discharge status: Home Health | Payer: Medicare Other | Source: Ambulatory Visit | Attending: Orthopaedic Surgery | Admitting: Orthopaedic Surgery

## 2016-01-12 DIAGNOSIS — Z87891 Personal history of nicotine dependence: Secondary | ICD-10-CM | POA: Diagnosis not present

## 2016-01-12 DIAGNOSIS — T8454XA Infection and inflammatory reaction due to internal left knee prosthesis, initial encounter: Secondary | ICD-10-CM

## 2016-01-12 DIAGNOSIS — Z9889 Other specified postprocedural states: Secondary | ICD-10-CM

## 2016-01-12 DIAGNOSIS — B957 Other staphylococcus as the cause of diseases classified elsewhere: Secondary | ICD-10-CM

## 2016-01-12 DIAGNOSIS — E785 Hyperlipidemia, unspecified: Secondary | ICD-10-CM | POA: Diagnosis not present

## 2016-01-12 DIAGNOSIS — I252 Old myocardial infarction: Secondary | ICD-10-CM | POA: Diagnosis not present

## 2016-01-12 DIAGNOSIS — G609 Hereditary and idiopathic neuropathy, unspecified: Secondary | ICD-10-CM | POA: Diagnosis present

## 2016-01-12 DIAGNOSIS — Z808 Family history of malignant neoplasm of other organs or systems: Secondary | ICD-10-CM

## 2016-01-12 DIAGNOSIS — Z8041 Family history of malignant neoplasm of ovary: Secondary | ICD-10-CM

## 2016-01-12 DIAGNOSIS — K219 Gastro-esophageal reflux disease without esophagitis: Secondary | ICD-10-CM | POA: Diagnosis present

## 2016-01-12 DIAGNOSIS — R2689 Other abnormalities of gait and mobility: Secondary | ICD-10-CM | POA: Diagnosis not present

## 2016-01-12 DIAGNOSIS — T8454XS Infection and inflammatory reaction due to internal left knee prosthesis, sequela: Secondary | ICD-10-CM

## 2016-01-12 DIAGNOSIS — Y831 Surgical operation with implant of artificial internal device as the cause of abnormal reaction of the patient, or of later complication, without mention of misadventure at the time of the procedure: Secondary | ICD-10-CM | POA: Diagnosis present

## 2016-01-12 DIAGNOSIS — M00062 Staphylococcal arthritis, left knee: Secondary | ICD-10-CM | POA: Diagnosis not present

## 2016-01-12 DIAGNOSIS — Z833 Family history of diabetes mellitus: Secondary | ICD-10-CM

## 2016-01-12 DIAGNOSIS — M25562 Pain in left knee: Secondary | ICD-10-CM | POA: Diagnosis present

## 2016-01-12 DIAGNOSIS — Z8249 Family history of ischemic heart disease and other diseases of the circulatory system: Secondary | ICD-10-CM

## 2016-01-12 DIAGNOSIS — L309 Dermatitis, unspecified: Secondary | ICD-10-CM | POA: Diagnosis not present

## 2016-01-12 DIAGNOSIS — I2510 Atherosclerotic heart disease of native coronary artery without angina pectoris: Secondary | ICD-10-CM | POA: Diagnosis not present

## 2016-01-12 DIAGNOSIS — Y792 Prosthetic and other implants, materials and accessory orthopedic devices associated with adverse incidents: Secondary | ICD-10-CM

## 2016-01-12 DIAGNOSIS — I714 Abdominal aortic aneurysm, without rupture: Secondary | ICD-10-CM | POA: Diagnosis not present

## 2016-01-12 DIAGNOSIS — Z7984 Long term (current) use of oral hypoglycemic drugs: Secondary | ICD-10-CM | POA: Diagnosis not present

## 2016-01-12 DIAGNOSIS — Z79899 Other long term (current) drug therapy: Secondary | ICD-10-CM

## 2016-01-12 DIAGNOSIS — I110 Hypertensive heart disease with heart failure: Secondary | ICD-10-CM | POA: Diagnosis present

## 2016-01-12 DIAGNOSIS — I509 Heart failure, unspecified: Secondary | ICD-10-CM | POA: Diagnosis not present

## 2016-01-12 DIAGNOSIS — T8454XD Infection and inflammatory reaction due to internal left knee prosthesis, subsequent encounter: Secondary | ICD-10-CM | POA: Diagnosis not present

## 2016-01-12 DIAGNOSIS — Z803 Family history of malignant neoplasm of breast: Secondary | ICD-10-CM | POA: Diagnosis not present

## 2016-01-12 DIAGNOSIS — G4730 Sleep apnea, unspecified: Secondary | ICD-10-CM | POA: Diagnosis not present

## 2016-01-12 DIAGNOSIS — Z794 Long term (current) use of insulin: Secondary | ICD-10-CM

## 2016-01-12 DIAGNOSIS — M6281 Muscle weakness (generalized): Secondary | ICD-10-CM | POA: Diagnosis not present

## 2016-01-12 DIAGNOSIS — M1990 Unspecified osteoarthritis, unspecified site: Secondary | ICD-10-CM | POA: Diagnosis not present

## 2016-01-12 DIAGNOSIS — B9689 Other specified bacterial agents as the cause of diseases classified elsewhere: Secondary | ICD-10-CM | POA: Diagnosis not present

## 2016-01-12 DIAGNOSIS — T8450XA Infection and inflammatory reaction due to unspecified internal joint prosthesis, initial encounter: Secondary | ICD-10-CM | POA: Diagnosis present

## 2016-01-12 DIAGNOSIS — M00862 Arthritis due to other bacteria, left knee: Secondary | ICD-10-CM | POA: Diagnosis not present

## 2016-01-12 HISTORY — PX: I & D EXTREMITY: SHX5045

## 2016-01-12 HISTORY — PX: REIMPLANTATION OF CEMENTED SPACER KNEE: SHX6050

## 2016-01-12 LAB — GLUCOSE, CAPILLARY
GLUCOSE-CAPILLARY: 84 mg/dL (ref 65–99)
Glucose-Capillary: 102 mg/dL — ABNORMAL HIGH (ref 65–99)
Glucose-Capillary: 122 mg/dL — ABNORMAL HIGH (ref 65–99)

## 2016-01-12 SURGERY — IRRIGATION AND DEBRIDEMENT EXTREMITY
Anesthesia: General | Site: Knee | Laterality: Left

## 2016-01-12 MED ORDER — ASPIRIN 325 MG PO TABS: 325.0000 mg | ORAL_TABLET | Freq: Every day | ORAL | Status: DC

## 2016-01-12 MED ORDER — PROPOFOL 10 MG/ML IV BOLUS
INTRAVENOUS | Status: AC
  Filled 2016-01-12: qty 20

## 2016-01-12 MED ORDER — FENTANYL CITRATE (PF) 100 MCG/2ML IJ SOLN
INTRAMUSCULAR | Status: AC
  Filled 2016-01-12: qty 4

## 2016-01-12 MED ORDER — LISINOPRIL 20 MG PO TABS
20.0000 mg | ORAL_TABLET | Freq: Every day | ORAL | Status: DC
  Administered 2016-01-12 – 2016-01-18 (×7): 20 mg via ORAL
  Filled 2016-01-12 (×7): qty 1

## 2016-01-12 MED ORDER — CALCIUM CARBONATE-VITAMIN D 500-200 MG-UNIT PO TABS
1.0000 | ORAL_TABLET | Freq: Two times a day (BID) | ORAL | Status: DC
  Administered 2016-01-12 – 2016-01-19 (×14): 1 via ORAL
  Filled 2016-01-12 (×14): qty 1

## 2016-01-12 MED ORDER — GLYCOPYRROLATE 0.2 MG/ML IJ SOLN
INTRAMUSCULAR | Status: DC | PRN
  Administered 2016-01-12 (×2): 0.2 mg via INTRAVENOUS

## 2016-01-12 MED ORDER — SUGAMMADEX SODIUM 200 MG/2ML IV SOLN
INTRAVENOUS | Status: AC
  Filled 2016-01-12: qty 4

## 2016-01-12 MED ORDER — LORAZEPAM 1 MG PO TABS: 1.0000 mg | ORAL_TABLET | Freq: Two times a day (BID) | ORAL | Status: DC | PRN

## 2016-01-12 MED ORDER — FENTANYL CITRATE (PF) 100 MCG/2ML IJ SOLN
INTRAMUSCULAR | Status: AC
  Filled 2016-01-12: qty 2

## 2016-01-12 MED ORDER — PHENYLEPHRINE 40 MCG/ML (10ML) SYRINGE FOR IV PUSH (FOR BLOOD PRESSURE SUPPORT)
PREFILLED_SYRINGE | INTRAVENOUS | Status: AC
  Filled 2016-01-12: qty 10

## 2016-01-12 MED ORDER — POLYETHYL GLYCOL-PROPYL GLYCOL 0.4-0.3 % OP SOLN: 1.0000 [drp] | Freq: Every day | OPHTHALMIC | Status: DC | PRN

## 2016-01-12 MED ORDER — ADULT MULTIVITAMIN W/MINERALS CH
1.0000 | ORAL_TABLET | Freq: Every day | ORAL | Status: DC
  Administered 2016-01-13 – 2016-01-19 (×7): 1 via ORAL
  Filled 2016-01-12 (×7): qty 1

## 2016-01-12 MED ORDER — CARVEDILOL 12.5 MG PO TABS
12.5000 mg | ORAL_TABLET | Freq: Two times a day (BID) | ORAL | Status: DC
  Administered 2016-01-13 – 2016-01-19 (×13): 12.5 mg via ORAL
  Filled 2016-01-12 (×13): qty 1

## 2016-01-12 MED ORDER — FLUOXETINE HCL 40 MG PO CAPS: 40.0000 mg | ORAL_CAPSULE | Freq: Every day | ORAL | Status: DC

## 2016-01-12 MED ORDER — CEFAZOLIN SODIUM-DEXTROSE 2-4 GM/100ML-% IV SOLN
2.0000 g | Freq: Three times a day (TID) | INTRAVENOUS | Status: DC
  Filled 2016-01-12 (×2): qty 100

## 2016-01-12 MED ORDER — GLYCOPYRROLATE 0.2 MG/ML IV SOSY
PREFILLED_SYRINGE | INTRAVENOUS | Status: AC
  Filled 2016-01-12: qty 3

## 2016-01-12 MED ORDER — LIDOCAINE 2% (20 MG/ML) 5 ML SYRINGE
INTRAMUSCULAR | Status: AC
  Filled 2016-01-12: qty 5

## 2016-01-12 MED ORDER — ONDANSETRON HCL 4 MG/2ML IJ SOLN: 4.0000 mg | Freq: Four times a day (QID) | INTRAMUSCULAR | Status: DC | PRN

## 2016-01-12 MED ORDER — OXYCODONE-ACETAMINOPHEN 5-325 MG PO TABS
1.0000 | ORAL_TABLET | ORAL | Status: DC | PRN
  Administered 2016-01-13 – 2016-01-15 (×10): 2 via ORAL
  Administered 2016-01-16 (×2): 1 via ORAL
  Administered 2016-01-16: 2 via ORAL
  Administered 2016-01-17 – 2016-01-18 (×3): 1 via ORAL
  Filled 2016-01-12: qty 1
  Filled 2016-01-12 (×5): qty 2
  Filled 2016-01-12: qty 1
  Filled 2016-01-12 (×2): qty 2
  Filled 2016-01-12 (×2): qty 1
  Filled 2016-01-12 (×5): qty 2

## 2016-01-12 MED ORDER — ASPIRIN EC 325 MG PO TBEC
325.0000 mg | DELAYED_RELEASE_TABLET | Freq: Every day | ORAL | Status: DC
  Administered 2016-01-13 – 2016-01-19 (×7): 325 mg via ORAL
  Filled 2016-01-12 (×7): qty 1

## 2016-01-12 MED ORDER — KETAMINE HCL-SODIUM CHLORIDE 100-0.9 MG/10ML-% IV SOSY
PREFILLED_SYRINGE | INTRAVENOUS | Status: AC
  Filled 2016-01-12: qty 10

## 2016-01-12 MED ORDER — METHOCARBAMOL 500 MG PO TABS
500.0000 mg | ORAL_TABLET | Freq: Four times a day (QID) | ORAL | Status: DC | PRN
  Administered 2016-01-13 – 2016-01-15 (×6): 500 mg via ORAL
  Filled 2016-01-12 (×6): qty 1

## 2016-01-12 MED ORDER — HYDROMORPHONE HCL 1 MG/ML IJ SOLN
0.2500 mg | INTRAMUSCULAR | Status: DC | PRN
  Administered 2016-01-12 (×4): 0.5 mg via INTRAVENOUS

## 2016-01-12 MED ORDER — LACTATED RINGERS IV SOLN
INTRAVENOUS | Status: DC
  Administered 2016-01-12: 15:00:00 via INTRAVENOUS
  Administered 2016-01-12: 50 mL/h via INTRAVENOUS

## 2016-01-12 MED ORDER — DOCUSATE SODIUM 100 MG PO CAPS
100.0000 mg | ORAL_CAPSULE | Freq: Two times a day (BID) | ORAL | Status: DC
  Administered 2016-01-12 – 2016-01-19 (×14): 100 mg via ORAL
  Filled 2016-01-12 (×14): qty 1

## 2016-01-12 MED ORDER — HYDROMORPHONE HCL 2 MG/ML IJ SOLN
1.0000 mg | INTRAMUSCULAR | Status: DC | PRN
  Administered 2016-01-12 – 2016-01-13 (×2): 1 mg via INTRAVENOUS
  Filled 2016-01-12 (×2): qty 1

## 2016-01-12 MED ORDER — FUROSEMIDE 20 MG PO TABS
20.0000 mg | ORAL_TABLET | Freq: Two times a day (BID) | ORAL | Status: DC
  Administered 2016-01-12 – 2016-01-19 (×14): 20 mg via ORAL
  Filled 2016-01-12 (×14): qty 1

## 2016-01-12 MED ORDER — SUGAMMADEX SODIUM 200 MG/2ML IV SOLN
INTRAVENOUS | Status: DC | PRN
  Administered 2016-01-12: 300 mg via INTRAVENOUS

## 2016-01-12 MED ORDER — POTASSIUM CHLORIDE CRYS ER 20 MEQ PO TBCR
20.0000 meq | EXTENDED_RELEASE_TABLET | Freq: Every day | ORAL | Status: DC
  Administered 2016-01-13 – 2016-01-19 (×7): 20 meq via ORAL
  Filled 2016-01-12 (×7): qty 1

## 2016-01-12 MED ORDER — SUCCINYLCHOLINE CHLORIDE 200 MG/10ML IV SOSY
PREFILLED_SYRINGE | INTRAVENOUS | Status: AC
  Filled 2016-01-12: qty 10

## 2016-01-12 MED ORDER — HYDROMORPHONE HCL 1 MG/ML IJ SOLN: 0.2500 mg | INTRAMUSCULAR | Status: DC | PRN

## 2016-01-12 MED ORDER — CALCIUM CARB-CHOLECALCIFEROL 500-400 MG-UNIT PO TABS: 1.0000 | ORAL_TABLET | Freq: Two times a day (BID) | ORAL | Status: DC

## 2016-01-12 MED ORDER — ROCURONIUM BROMIDE 100 MG/10ML IV SOLN
INTRAVENOUS | Status: DC | PRN
  Administered 2016-01-12: 60 mg via INTRAVENOUS

## 2016-01-12 MED ORDER — ONE-DAILY MULTI VITAMINS PO TABS: 1.0000 | ORAL_TABLET | Freq: Every day | ORAL | Status: DC

## 2016-01-12 MED ORDER — GABAPENTIN 300 MG PO CAPS
300.0000 mg | ORAL_CAPSULE | Freq: Two times a day (BID) | ORAL | Status: DC
  Administered 2016-01-12 – 2016-01-19 (×14): 300 mg via ORAL
  Filled 2016-01-12 (×14): qty 1

## 2016-01-12 MED ORDER — MIDAZOLAM HCL 2 MG/2ML IJ SOLN
INTRAMUSCULAR | Status: AC
  Filled 2016-01-12: qty 2

## 2016-01-12 MED ORDER — VANCOMYCIN HCL 10 G IV SOLR
2000.0000 mg | INTRAVENOUS | Status: DC
  Filled 2016-01-12 (×2): qty 2000

## 2016-01-12 MED ORDER — SODIUM CHLORIDE 0.9 % IR SOLN
Status: DC | PRN
  Administered 2016-01-12: 3000 mL

## 2016-01-12 MED ORDER — FINASTERIDE 5 MG PO TABS
5.0000 mg | ORAL_TABLET | Freq: Every day | ORAL | Status: DC
  Administered 2016-01-13 – 2016-01-19 (×7): 5 mg via ORAL
  Filled 2016-01-12 (×7): qty 1

## 2016-01-12 MED ORDER — METOCLOPRAMIDE HCL 5 MG/ML IJ SOLN
5.0000 mg | Freq: Three times a day (TID) | INTRAMUSCULAR | Status: DC | PRN
Start: 2016-01-12 — End: 2016-01-19

## 2016-01-12 MED ORDER — METHOCARBAMOL 1000 MG/10ML IJ SOLN
500.0000 mg | Freq: Four times a day (QID) | INTRAVENOUS | Status: DC | PRN
  Filled 2016-01-12: qty 5

## 2016-01-12 MED ORDER — LIDOCAINE HCL (CARDIAC) 20 MG/ML IV SOLN
INTRAVENOUS | Status: DC | PRN
  Administered 2016-01-12: 100 mg via INTRAVENOUS

## 2016-01-12 MED ORDER — VANCOMYCIN HCL IN DEXTROSE 750-5 MG/150ML-% IV SOLN
750.0000 mg | Freq: Two times a day (BID) | INTRAVENOUS | Status: DC
  Administered 2016-01-13 – 2016-01-15 (×5): 750 mg via INTRAVENOUS
  Filled 2016-01-12 (×6): qty 150

## 2016-01-12 MED ORDER — PROPOFOL 10 MG/ML IV BOLUS
INTRAVENOUS | Status: DC | PRN
  Administered 2016-01-12: 80 mg via INTRAVENOUS
  Administered 2016-01-12: 120 mg via INTRAVENOUS

## 2016-01-12 MED ORDER — PROMETHAZINE HCL 25 MG/ML IJ SOLN: 6.2500 mg | INTRAMUSCULAR | Status: DC | PRN

## 2016-01-12 MED ORDER — HYDROMORPHONE HCL 1 MG/ML IJ SOLN
INTRAMUSCULAR | Status: AC
  Filled 2016-01-12: qty 1

## 2016-01-12 MED ORDER — ACETAMINOPHEN 325 MG PO TABS: 650.0000 mg | ORAL_TABLET | Freq: Four times a day (QID) | ORAL | Status: DC | PRN

## 2016-01-12 MED ORDER — FENTANYL CITRATE (PF) 100 MCG/2ML IJ SOLN
INTRAMUSCULAR | Status: DC | PRN
  Administered 2016-01-12 (×8): 50 ug via INTRAVENOUS
  Administered 2016-01-12: 100 ug via INTRAVENOUS

## 2016-01-12 MED ORDER — VANCOMYCIN HCL 1000 MG IV SOLR
INTRAVENOUS | Status: DC | PRN
  Administered 2016-01-12: 3 g

## 2016-01-12 MED ORDER — PROPOFOL 10 MG/ML IV BOLUS
INTRAVENOUS | Status: AC
Start: 2016-01-12 — End: 2016-01-12
  Filled 2016-01-12: qty 20

## 2016-01-12 MED ORDER — VANCOMYCIN HCL 1000 MG IV SOLR
INTRAVENOUS | Status: AC
  Filled 2016-01-12: qty 3000

## 2016-01-12 MED ORDER — RIFAMPIN 300 MG PO CAPS
300.0000 mg | ORAL_CAPSULE | Freq: Two times a day (BID) | ORAL | Status: DC
  Filled 2016-01-12: qty 1

## 2016-01-12 MED ORDER — IBUPROFEN 400 MG PO TABS
400.0000 mg | ORAL_TABLET | Freq: Four times a day (QID) | ORAL | Status: DC | PRN
  Administered 2016-01-13: 400 mg via ORAL
  Filled 2016-01-12: qty 1

## 2016-01-12 MED ORDER — ONDANSETRON HCL 4 MG/2ML IJ SOLN
INTRAMUSCULAR | Status: DC | PRN
Start: 2016-01-12 — End: 2016-01-12
  Administered 2016-01-12: 4 mg via INTRAVENOUS

## 2016-01-12 MED ORDER — INSULIN ASPART 100 UNIT/ML ~~LOC~~ SOLN
0.0000 [IU] | Freq: Three times a day (TID) | SUBCUTANEOUS | Status: DC
  Administered 2016-01-13 – 2016-01-18 (×5): 2 [IU] via SUBCUTANEOUS

## 2016-01-12 MED ORDER — VANCOMYCIN HCL 10 G IV SOLR
2000.0000 mg | Freq: Once | INTRAVENOUS | Status: AC
  Administered 2016-01-12: 2000 mg via INTRAVENOUS
  Filled 2016-01-12 (×2): qty 2000

## 2016-01-12 MED ORDER — ARTIFICIAL TEARS OP OINT
TOPICAL_OINTMENT | OPHTHALMIC | Status: AC
  Filled 2016-01-12: qty 3.5

## 2016-01-12 MED ORDER — ONDANSETRON HCL 4 MG/2ML IJ SOLN
INTRAMUSCULAR | Status: AC
  Filled 2016-01-12: qty 2

## 2016-01-12 MED ORDER — FLUOXETINE HCL 20 MG PO CAPS
40.0000 mg | ORAL_CAPSULE | Freq: Every day | ORAL | Status: DC
  Administered 2016-01-13 – 2016-01-19 (×7): 40 mg via ORAL
  Filled 2016-01-12 (×7): qty 2

## 2016-01-12 MED ORDER — SODIUM CHLORIDE 0.9 % IV SOLN
INTRAVENOUS | Status: DC
  Administered 2016-01-12 – 2016-01-14 (×2): via INTRAVENOUS
  Administered 2016-01-14: 1000 mL via INTRAVENOUS
  Administered 2016-01-16 – 2016-01-18 (×3): via INTRAVENOUS

## 2016-01-12 MED ORDER — ASPIRIN EC 325 MG PO TBEC: 325.0000 mg | DELAYED_RELEASE_TABLET | Freq: Every day | ORAL | Status: DC

## 2016-01-12 MED ORDER — INSULIN ASPART 100 UNIT/ML ~~LOC~~ SOLN: 0.0000 [IU] | Freq: Every day | SUBCUTANEOUS | Status: DC

## 2016-01-12 MED ORDER — HYDROCODONE-ACETAMINOPHEN 5-325 MG PO TABS
ORAL_TABLET | ORAL | Status: AC
  Filled 2016-01-12: qty 1

## 2016-01-12 MED ORDER — METOCLOPRAMIDE HCL 10 MG PO TABS
5.0000 mg | ORAL_TABLET | Freq: Three times a day (TID) | ORAL | Status: DC | PRN
Start: 2016-01-12 — End: 2016-01-19

## 2016-01-12 MED ORDER — METOCLOPRAMIDE HCL 5 MG/ML IJ SOLN: 10.0000 mg | Freq: Once | INTRAMUSCULAR | Status: DC | PRN

## 2016-01-12 MED ORDER — PANTOPRAZOLE SODIUM 40 MG PO TBEC
40.0000 mg | DELAYED_RELEASE_TABLET | Freq: Two times a day (BID) | ORAL | Status: DC
  Administered 2016-01-12 – 2016-01-19 (×14): 40 mg via ORAL
  Filled 2016-01-12 (×14): qty 1

## 2016-01-12 MED ORDER — ACETAMINOPHEN 650 MG RE SUPP: 650.0000 mg | Freq: Four times a day (QID) | RECTAL | Status: DC | PRN

## 2016-01-12 MED ORDER — MEPERIDINE HCL 25 MG/ML IJ SOLN: 6.2500 mg | INTRAMUSCULAR | Status: DC | PRN

## 2016-01-12 MED ORDER — ALFUZOSIN HCL ER 10 MG PO TB24
10.0000 mg | ORAL_TABLET | Freq: Every day | ORAL | Status: DC
  Administered 2016-01-13 – 2016-01-19 (×7): 10 mg via ORAL
  Filled 2016-01-12 (×7): qty 1

## 2016-01-12 MED ORDER — ONDANSETRON HCL 4 MG PO TABS: 4.0000 mg | ORAL_TABLET | Freq: Four times a day (QID) | ORAL | Status: DC | PRN

## 2016-01-12 MED ORDER — HYDROCODONE-ACETAMINOPHEN 5-325 MG PO TABS
1.0000 | ORAL_TABLET | Freq: Once | ORAL | Status: AC | PRN
  Administered 2016-01-12: 1 via ORAL

## 2016-01-12 MED ORDER — DEXTROSE 5 % IV SOLN
2.0000 g | INTRAVENOUS | Status: DC
  Administered 2016-01-12 – 2016-01-14 (×3): 2 g via INTRAVENOUS
  Filled 2016-01-12 (×4): qty 2

## 2016-01-12 MED ORDER — VITAMIN C 500 MG PO TABS
500.0000 mg | ORAL_TABLET | Freq: Every day | ORAL | Status: DC
  Administered 2016-01-13 – 2016-01-19 (×7): 500 mg via ORAL
  Filled 2016-01-12 (×7): qty 1

## 2016-01-12 MED ORDER — DIPHENHYDRAMINE HCL 12.5 MG/5ML PO ELIX: 12.5000 mg | ORAL_SOLUTION | ORAL | Status: DC | PRN

## 2016-01-12 MED ORDER — POLYVINYL ALCOHOL 1.4 % OP SOLN
1.0000 [drp] | Freq: Every day | OPHTHALMIC | Status: DC | PRN
  Filled 2016-01-12: qty 15

## 2016-01-12 MED ORDER — METHOCARBAMOL 500 MG PO TABS: 500.0000 mg | ORAL_TABLET | Freq: Four times a day (QID) | ORAL | Status: DC | PRN

## 2016-01-12 MED ORDER — ATORVASTATIN CALCIUM 40 MG PO TABS
40.0000 mg | ORAL_TABLET | Freq: Every evening | ORAL | Status: DC
  Administered 2016-01-12 – 2016-01-18 (×7): 40 mg via ORAL
  Filled 2016-01-12 (×7): qty 1

## 2016-01-12 SURGICAL SUPPLY — 65 items
BANDAGE ACE 6X5 VEL STRL LF (GAUZE/BANDAGES/DRESSINGS) ×2 IMPLANT
BANDAGE ELASTIC 3 VELCRO ST LF (GAUZE/BANDAGES/DRESSINGS) IMPLANT
BLADE SURG 10 STRL SS (BLADE) ×2 IMPLANT
BNDG COHESIVE 4X5 TAN STRL (GAUZE/BANDAGES/DRESSINGS) ×2 IMPLANT
BNDG COHESIVE 6X5 TAN STRL LF (GAUZE/BANDAGES/DRESSINGS) ×4 IMPLANT
BNDG GAUZE ELAST 4 BULKY (GAUZE/BANDAGES/DRESSINGS) ×4 IMPLANT
BONE CEMENT PALACOS W/GENTAMIC (Orthopedic Implant) ×6 IMPLANT
COVER SURGICAL LIGHT HANDLE (MISCELLANEOUS) ×2 IMPLANT
CUFF TOURNIQUET SINGLE 18IN (TOURNIQUET CUFF) ×2 IMPLANT
CUFF TOURNIQUET SINGLE 34IN LL (TOURNIQUET CUFF) IMPLANT
DRAPE ORTHO SPLIT 77X108 STRL (DRAPES) ×2
DRAPE SURG 17X23 STRL (DRAPES) IMPLANT
DRAPE SURG ORHT 6 SPLT 77X108 (DRAPES) ×2 IMPLANT
DRAPE U-SHAPE 47X51 STRL (DRAPES) ×2 IMPLANT
DRSG PAD ABDOMINAL 8X10 ST (GAUZE/BANDAGES/DRESSINGS) ×8 IMPLANT
DURAPREP 26ML APPLICATOR (WOUND CARE) ×2 IMPLANT
ELECT REM PT RETURN 9FT ADLT (ELECTROSURGICAL)
ELECTRODE REM PT RTRN 9FT ADLT (ELECTROSURGICAL) IMPLANT
FACESHIELD WRAPAROUND (MASK) ×2 IMPLANT
GAUZE SPONGE 4X4 12PLY STRL (GAUZE/BANDAGES/DRESSINGS) ×2 IMPLANT
GAUZE XEROFORM 1X8 LF (GAUZE/BANDAGES/DRESSINGS) ×2 IMPLANT
GAUZE XEROFORM 5X9 LF (GAUZE/BANDAGES/DRESSINGS) ×2 IMPLANT
GLOVE BIO SURGEON STRL SZ8 (GLOVE) ×2 IMPLANT
GLOVE BIOGEL PI IND STRL 8 (GLOVE) ×2 IMPLANT
GLOVE BIOGEL PI INDICATOR 8 (GLOVE) ×2
GLOVE ORTHO TXT STRL SZ7.5 (GLOVE) ×2 IMPLANT
GOWN STRL REUS W/ TWL LRG LVL3 (GOWN DISPOSABLE) ×1 IMPLANT
GOWN STRL REUS W/ TWL XL LVL3 (GOWN DISPOSABLE) ×2 IMPLANT
GOWN STRL REUS W/TWL LRG LVL3 (GOWN DISPOSABLE) ×1
GOWN STRL REUS W/TWL XL LVL3 (GOWN DISPOSABLE) ×2
HANDPIECE INTERPULSE COAX TIP (DISPOSABLE)
IMMOBILIZER KNEE 20 (SOFTGOODS) ×2
IMMOBILIZER KNEE 20 THIGH 36 (SOFTGOODS) ×1 IMPLANT
KIT BASIN OR (CUSTOM PROCEDURE TRAY) ×2 IMPLANT
KIT ROOM TURNOVER OR (KITS) ×2 IMPLANT
MANIFOLD NEPTUNE II (INSTRUMENTS) ×2 IMPLANT
NS IRRIG 1000ML POUR BTL (IV SOLUTION) ×2 IMPLANT
PACK ORTHO EXTREMITY (CUSTOM PROCEDURE TRAY) ×2 IMPLANT
PAD ARMBOARD 7.5X6 YLW CONV (MISCELLANEOUS) ×4 IMPLANT
PADDING CAST ABS 4INX4YD NS (CAST SUPPLIES) ×2
PADDING CAST ABS COTTON 4X4 ST (CAST SUPPLIES) ×2 IMPLANT
PADDING CAST COTTON 6X4 STRL (CAST SUPPLIES) ×2 IMPLANT
SET HNDPC FAN SPRY TIP SCT (DISPOSABLE) IMPLANT
SMARTMIX MINI TOWER (MISCELLANEOUS) ×2
SPONGE GAUZE 4X4 12PLY STER LF (GAUZE/BANDAGES/DRESSINGS) ×2 IMPLANT
SPONGE LAP 18X18 X RAY DECT (DISPOSABLE) ×2 IMPLANT
STOCKINETTE IMPERVIOUS 9X36 MD (GAUZE/BANDAGES/DRESSINGS) ×2 IMPLANT
SUT ETHIBOND NAB CT1 #1 30IN (SUTURE) ×2 IMPLANT
SUT ETHILON 2 0 FS 18 (SUTURE) IMPLANT
SUT ETHILON 2 0 PSLX (SUTURE) ×8 IMPLANT
SUT ETHILON 3 0 PS 1 (SUTURE) IMPLANT
SUT VIC AB 0 CT1 27 (SUTURE) ×2
SUT VIC AB 0 CT1 27XBRD ANBCTR (SUTURE) ×2 IMPLANT
SUT VIC AB 1 CT1 27 (SUTURE) ×3
SUT VIC AB 1 CT1 27XBRD ANBCTR (SUTURE) ×3 IMPLANT
SUT VIC AB 2-0 FS1 27 (SUTURE) ×4 IMPLANT
SUT VIC AB 2-0 SH 27 (SUTURE) ×1
SUT VIC AB 2-0 SH 27X BRD (SUTURE) ×1 IMPLANT
TOWEL OR 17X24 6PK STRL BLUE (TOWEL DISPOSABLE) ×2 IMPLANT
TOWEL OR 17X26 10 PK STRL BLUE (TOWEL DISPOSABLE) ×2 IMPLANT
TOWER SMARTMIX MINI (MISCELLANEOUS) ×1 IMPLANT
TUBE ANAEROBIC SPECIMEN COL (MISCELLANEOUS) IMPLANT
TUBE CONNECTING 12X1/4 (SUCTIONS) ×2 IMPLANT
UNDERPAD 30X30 (UNDERPADS AND DIAPERS) ×2 IMPLANT
YANKAUER SUCT BULB TIP NO VENT (SUCTIONS) ×4 IMPLANT
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
## 2016-01-12 NOTE — Transfer of Care (Signed)
Immediate Anesthesia Transfer of Care Note  Patient: Steven Charles  Procedure(s) Performed: Procedure(s): IRRIGATION AND DEBRIDEMENT LEFT KNEE, PLACEMENT OF ANTIBIOTIC CEMENT SPACER (Left) REIMPLANTATION OF CEMENTED SPACER KNEE (Left)  Patient Location: PACU  Anesthesia Type:General  Level of Consciousness: awake, alert , oriented and patient cooperative  Airway & Oxygen Therapy: Patient Spontanous Breathing and Patient connected to nasal cannula oxygen  Post-op Assessment: Report given to RN, Post -op Vital signs reviewed and stable and Patient moving all extremities X 4  Post vital signs: Reviewed and stable  Last Vitals:  Vitals:   01/12/16 1203  BP: 137/78  Pulse: 62  Resp: 18  Temp: 36.8 C    Last Pain:  Vitals:   01/12/16 1203  TempSrc: Oral         Complications: No apparent anesthesia complications

## 2016-01-12 NOTE — Brief Op Note (Signed)
01/12/2016  5:29 PM  PATIENT:  Steven Charles  70 y.o. male  PRE-OPERATIVE DIAGNOSIS:  chronic infection left knee  POST-OPERATIVE DIAGNOSIS:  chronic infection left knee  PROCEDURE:  Procedure(s): IRRIGATION AND DEBRIDEMENT LEFT KNEE, PLACEMENT OF ANTIBIOTIC CEMENT SPACER (Left) REIMPLANTATION OF CEMENTED SPACER KNEE (Left)  SURGEON:  Surgeon(s) and Role:    * Mcarthur Rossetti, MD - Primary  PHYSICIAN ASSISTANT: Benita Stabile, PA-C  ANESTHESIA:   general  EBL:  Total I/O In: 800 [I.V.:800] Out: 50 [Blood:50]  COUNTS:  YES  PLAN OF CARE: Admit to inpatient   PATIENT DISPOSITION:  PACU - hemodynamically stable.   Delay start of Pharmacological VTE agent (>24hrs) due to surgical blood loss or risk of bleeding: no
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
## 2016-01-12 NOTE — Anesthesia Postprocedure Evaluation (Signed)
Anesthesia Post Note  Patient: Steven Charles  Procedure(s) Performed: Procedure(s) (LRB): IRRIGATION AND DEBRIDEMENT LEFT KNEE, PLACEMENT OF ANTIBIOTIC CEMENT SPACER (Left) REIMPLANTATION OF CEMENTED SPACER KNEE (Left)  Patient location during evaluation: PACU Anesthesia Type: General Level of consciousness: awake and alert and oriented Pain management: pain level controlled Vital Signs Assessment: post-procedure vital signs reviewed and stable Respiratory status: spontaneous breathing, nonlabored ventilation, respiratory function stable and patient connected to nasal cannula oxygen Cardiovascular status: blood pressure returned to baseline and stable Postop Assessment: no signs of nausea or vomiting Anesthetic complications: no    Last Vitals:  Vitals:   01/12/16 1830 01/12/16 1845  BP: (!) 152/69 (!) 145/69  Pulse: 65 66  Resp: 13 11  Temp:      Last Pain:  Vitals:   01/12/16 1845  TempSrc:   PainSc: Asleep                 Hanson Medeiros A.

## 2016-01-12 NOTE — Anesthesia Preprocedure Evaluation (Addendum)
Anesthesia Evaluation  Patient identified by MRN, date of birth, ID band Patient awake    Reviewed: Allergy & Precautions, NPO status , Patient's Chart, lab work & pertinent test results, reviewed documented beta blocker date and time   History of Anesthesia Complications (+) history of anesthetic complications  Airway Mallampati: II  TM Distance: >3 FB Neck ROM: Full    Dental  (+) Upper Dentures, Dental Advisory Given   Pulmonary sleep apnea , pneumonia, resolved, former smoker,    Pulmonary exam normal breath sounds clear to auscultation       Cardiovascular hypertension, Pt. on medications and Pt. on home beta blockers + CAD, + Past MI, + CABG, + Peripheral Vascular Disease and +CHF  Normal cardiovascular exam Rhythm:Regular Rate:Normal  AAA   Neuro/Psych PSYCHIATRIC DISORDERS Anxiety Depression  Neuromuscular disease    GI/Hepatic Neg liver ROS, GERD  Medicated and Controlled,  Endo/Other  diabetes, Well Controlled, Type 2, Oral Hypoglycemic AgentsMorbid obesity  Renal/GU Renal diseasenegative Renal ROS  negative genitourinary   Musculoskeletal  (+) Arthritis , Osteoarthritis,  Chronic infection left knee   Abdominal (+) + obese,   Peds  Hematology  (+) Blood dyscrasia, anemia ,   Anesthesia Other Findings   Reproductive/Obstetrics                             Lab Results  Component Value Date   WBC 6.5 01/08/2016   HGB 11.1 (L) 01/08/2016   HCT 34.3 (L) 01/08/2016   MCV 86.2 01/08/2016   PLT 174 01/08/2016     Chemistry      Component Value Date/Time   NA 138 01/08/2016 0837   K 3.9 01/08/2016 0837   CL 104 01/08/2016 0837   CO2 25 01/08/2016 0837   BUN 15 01/08/2016 0837   CREATININE 0.93 01/08/2016 0837      Component Value Date/Time   CALCIUM 10.1 01/08/2016 0837   ALKPHOS 103 10/02/2015 1457   AST 9 10/02/2015 1457   ALT 1 10/02/2015 1457   BILITOT 0.4 10/02/2015  1457     EKG: normal sinus rhythm, Q waves in II, III, aVF, old inferior wall MI. Echo 01/06/2014: LVH, LVEF 50-55%, grade II diastolic dysfunction.  Anesthesia Physical  Anesthesia Plan  ASA: III  Anesthesia Plan: General   Post-op Pain Management:    Induction: Intravenous  Airway Management Planned: Oral ETT  Additional Equipment:   Intra-op Plan:   Post-operative Plan: Extubation in OR  Informed Consent: I have reviewed the patients History and Physical, chart, labs and discussed the procedure including the risks, benefits and alternatives for the proposed anesthesia with the patient or authorized representative who has indicated his/her understanding and acceptance.   Dental advisory given  Plan Discussed with: CRNA, Anesthesiologist and Surgeon  Anesthesia Plan Comments: ( )        Anesthesia Quick Evaluation

## 2016-01-12 NOTE — Progress Notes (Signed)
Pharmacy Antibiotic Note  Steven Charles is a 70 y.o. male with an infected TKA s/p I&D, removal of the antibiotc spacer and placement of a new spacer, and removal of a retained pin in the patella. Pharmacy been consulted for vancomycin dosing. -SCr= 0.93 on 01/08/16  Plan: -Vancomycin2000mg  x1 followed by 750mg  IV q12h -Will follow renal function, cultures and clinical progress -BMET in am   Height: 5' 9.5" (176.5 cm) Weight: 243 lb 12.8 oz (110.6 kg) IBW/kg (Calculated) : 71.85  Temp (24hrs), Avg:98 F (36.7 C), Min:97.7 F (36.5 C), Max:98.2 F (36.8 C)   Recent Labs Lab 01/08/16 0837  WBC 6.5  CREATININE 0.93    Estimated Creatinine Clearance: 88.8 mL/min (by C-G formula based on SCr of 0.93 mg/dL).    Allergies  Allergen Reactions  . Nicoderm [Nicotine] Other (See Comments)    Heart rate dropped   . Adhesive [Tape] Itching and Rash    Please use "paper" tape    Antimicrobials this admission: 10/17 rocephin 10/17 vanc  Dose adjustments this admission:  Microbiology results: 10/17 wound  Thank you for allowing pharmacy to be a part of this patient's care.  Hildred Laser, Pharm D 01/12/2016 6:48 PM

## 2016-01-12 NOTE — Progress Notes (Signed)
Will place him on empiric regimen of vancomycin and ceftriaxone ( which would also cover s. Ludgenesis). Will see him formally tomorrow

## 2016-01-12 NOTE — Anesthesia Procedure Notes (Signed)
Procedure Name: Intubation Date/Time: 01/12/2016 3:59 PM Performed by: Rejeana Brock L Pre-anesthesia Checklist: Patient identified, Emergency Drugs available, Suction available and Patient being monitored Patient Re-evaluated:Patient Re-evaluated prior to inductionOxygen Delivery Method: Circle System Utilized Preoxygenation: Pre-oxygenation with 100% oxygen Intubation Type: IV induction Ventilation: Mask ventilation without difficulty Laryngoscope Size: Mac and 4 Grade View: Grade I Tube type: Oral Tube size: 7.5 mm Number of attempts: 1 Airway Equipment and Method: Stylet and Oral airway Placement Confirmation: ETT inserted through vocal cords under direct vision,  positive ETCO2 and breath sounds checked- equal and bilateral Secured at: 22 cm Tube secured with: Tape Dental Injury: Teeth and Oropharynx as per pre-operative assessment

## 2016-01-12 NOTE — Telephone Encounter (Signed)
I let Dr. Baxter Flattery know about pt yesterday (he was admitted today)

## 2016-01-13 ENCOUNTER — Encounter (HOSPITAL_COMMUNITY): Payer: Self-pay | Admitting: Orthopaedic Surgery

## 2016-01-13 LAB — CBC WITH DIFFERENTIAL/PLATELET
BASOS PCT: 0 %
Basophils Absolute: 0 10*3/uL (ref 0.0–0.1)
Eosinophils Absolute: 0.2 10*3/uL (ref 0.0–0.7)
Eosinophils Relative: 3 %
HEMATOCRIT: 30.3 % — AB (ref 39.0–52.0)
Hemoglobin: 9.8 g/dL — ABNORMAL LOW (ref 13.0–17.0)
LYMPHS ABS: 0.7 10*3/uL (ref 0.7–4.0)
LYMPHS PCT: 10 %
MCH: 28.1 pg (ref 26.0–34.0)
MCHC: 32.3 g/dL (ref 30.0–36.0)
MCV: 86.8 fL (ref 78.0–100.0)
MONO ABS: 0.7 10*3/uL (ref 0.1–1.0)
MONOS PCT: 10 %
NEUTROS ABS: 5.4 10*3/uL (ref 1.7–7.7)
Neutrophils Relative %: 77 %
Platelets: 141 10*3/uL — ABNORMAL LOW (ref 150–400)
RBC: 3.49 MIL/uL — ABNORMAL LOW (ref 4.22–5.81)
RDW: 14.2 % (ref 11.5–15.5)
WBC: 7 10*3/uL (ref 4.0–10.5)

## 2016-01-13 LAB — BASIC METABOLIC PANEL
Anion gap: 7 (ref 5–15)
BUN: 10 mg/dL (ref 6–20)
CALCIUM: 9.5 mg/dL (ref 8.9–10.3)
CHLORIDE: 99 mmol/L — AB (ref 101–111)
CO2: 28 mmol/L (ref 22–32)
CREATININE: 0.9 mg/dL (ref 0.61–1.24)
GFR calc Af Amer: >60 mL/min (ref >60)
GFR calc non Af Amer: >60 mL/min (ref >60)
GLUCOSE: 109 mg/dL — AB (ref 65–99)
Potassium: 3.9 mmol/L (ref 3.5–5.1)
Sodium: 134 mmol/L — ABNORMAL LOW (ref 135–145)

## 2016-01-13 LAB — SEDIMENTATION RATE: Sed Rate: 50 mm/hr — ABNORMAL HIGH (ref 0–16)

## 2016-01-13 LAB — GLUCOSE, CAPILLARY
GLUCOSE-CAPILLARY: 129 mg/dL — AB (ref 65–99)
GLUCOSE-CAPILLARY: 110 mg/dL — AB (ref 65–99)
GLUCOSE-CAPILLARY: 154 mg/dL — AB (ref 65–99)
Glucose-Capillary: 98 mg/dL (ref 65–99)

## 2016-01-13 LAB — CK: Total CK: 49 U/L (ref 49–397)

## 2016-01-13 LAB — C-REACTIVE PROTEIN: CRP: 1.6 mg/dL — ABNORMAL HIGH (ref <1.0)

## 2016-01-13 MED ORDER — SODIUM CHLORIDE 0.9% FLUSH
10.0000 mL | INTRAVENOUS | Status: DC | PRN
Start: 2016-01-13 — End: 2016-01-19
  Administered 2016-01-15 – 2016-01-16 (×3): 10 mL
  Filled 2016-01-13 (×3): qty 40

## 2016-01-13 NOTE — Consult Note (Signed)
   [facility] CM Inpatient Consult   01/13/2016  STEVESON BOLING 08/06/1945 [id number]    Mr. Pihl is active with [facility] program. Please see chart review then notes for detailed patient outreach correspondence with [facility] [place].   Went to bedside to speak with Mr. Timmers. Had a lengthy conversation. Mrs. Hurd was not present during bedside encounter. Mr. Fyffe indicates he has intentions on returning home. He states he has had [facility]. Made inpatient RNCM aware that [facility] [facility] is active.   Mr. Colaluca indicates he and his wife have the appropriate contact numbers and the like regarding his billing concerns. Appreciative of follow up and visit. Will continue to follow.    Marthenia Rolling, MSN-Ed, RN,BSN [facility] Liaison [phone]

## 2016-01-13 NOTE — Progress Notes (Signed)
Subjective: 1 Day Post-Op Procedure(s) (LRB): IRRIGATION AND DEBRIDEMENT LEFT KNEE, PLACEMENT OF ANTIBIOTIC CEMENT SPACER (Left) REIMPLANTATION OF CEMENTED SPACER KNEE (Left) Patient reports pain as moderate.    Objective: Vital signs in last 24 hours: Temp:  [97.7 F (36.5 C)-99.9 F (37.7 C)] 99.9 F (37.7 C) (10/18 0507) Pulse Rate:  [61-79] 79 (10/18 0507) Resp:  [11-20] 20 (10/18 0507) BP: (119-155)/(53-78) 119/53 (10/18 0507) SpO2:  [93 %-100 %] 94 % (10/18 0507) Weight:  [110.6 kg (243 lb 12.8 oz)-114.4 kg (252 lb 3.2 oz)] 114.4 kg (252 lb 3.2 oz) (10/17 2006)  Intake/Output from previous day: 10/17 0701 - 10/18 0700 In: 1625 [P.O.:240; I.V.:835; IV Piggyback:550] Out: 950 [Urine:900; Blood:50] Intake/Output this shift: No intake/output data recorded.   Recent Labs  01/13/16 0450  HGB 9.8*    Recent Labs  01/13/16 0450  WBC 7.0  RBC 3.49*  HCT 30.3*  PLT 141*    Recent Labs  01/13/16 0450  NA 134*  K 3.9  CL 99*  CO2 28  BUN 10  CREATININE 0.90  GLUCOSE 109*  CALCIUM 9.5   No results for input(s): LABPT, INR in the last 72 hours.  Sensation intact distally Intact pulses distally Dorsiflexion/Plantar flexion intact Incision: dressing C/D/I Compartment soft  Assessment/Plan: 1 Day Post-Op Procedure(s) (LRB): IRRIGATION AND DEBRIDEMENT LEFT KNEE, PLACEMENT OF ANTIBIOTIC CEMENT SPACER (Left) REIMPLANTATION OF CEMENTED SPACER KNEE (Left) Up with therapy  Mcarthur Rossetti 01/13/2016, 7:04 AM

## 2016-01-13 NOTE — Evaluation (Signed)
Physical Therapy Evaluation Patient Details Name: Steven Charles MRN: [id number] DOB: October 10, 1945 Today's Date: 01/13/2016   History of Present Illness  70 yo male who a few months ago underwent excision of all left total knee components due to a chronic infection. He has an antibiotic space in his knee.  Pt underwent removal of previous antibiotic spacer L knee, repeat I and D, synovectomy, L knee and placement of new antibiotic spacer with cancomycin on 10/17.  Clinical Impression  Pt ambulation limited by L knee pain. Pt able to maintain L LE PWB at 50%. Will need to work on increasing ambulation tolerance. Acute PT to follow.    Follow Up Recommendations Home health PT;Supervision/Assistance - 24 hour (may progress and not need)    Equipment Recommendations  None recommended by PT    Recommendations for Other Services       Precautions / Restrictions Precautions Precautions: Fall Required Braces or Orthoses: Knee Immobilizer - Left Knee Immobilizer - Left: On at all times Restrictions Weight Bearing Restrictions: Yes LLE Weight Bearing: Partial weight bearing LLE Partial Weight Bearing Percentage or Pounds: 50%      Mobility  Bed Mobility Overal bed mobility: Needs Assistance Bed Mobility: Supine to Sit     Supine to sit: HOB elevated;Mod assist     General bed mobility comments: assist for L LE management, modA to bring hips to EOB  Transfers Overall transfer level: Needs assistance Equipment used: Rolling walker (2 wheeled) Transfers: Sit to/from Stand Sit to Stand: Min assist         General transfer comment: increased time  Ambulation/Gait Ambulation/Gait assistance: Min assist Ambulation Distance (Feet): 5 Feet Assistive device: Rolling walker (2 wheeled) Gait Pattern/deviations: Step-to pattern;Antalgic Gait velocity: slow   General Gait Details: pt with L LE shorter than R, pt unable to tolerate more than 25% WBing through ball of L LE due to  increased pain, significant dependence on UEs  Stairs            Wheelchair Mobility    Modified Rankin (Stroke Patients Only)       Balance Overall balance assessment:  (needs RW for stand/amb due to recent surgery)                                           Pertinent Vitals/Pain Pain Assessment: 0-10 Pain Score: 8  Pain Location: L knee    Home Living Family/patient expects to be discharged to:: Private residence Living Arrangements: Spouse/significant other;Children Available Help at Discharge: Family Type of Home: House Home Access: Ramped entrance     Home Layout: One level Home Equipment: Cane - single point;Walker - 2 wheels;Bedside commode;Shower seat      Prior Function Level of Independence: Independent with assistive device(s)         Comments: used cane/walker     Hand Dominance   Dominant Hand: Right    Extremity/Trunk Assessment   Upper Extremity Assessment: Overall WFL for tasks assessed           Lower Extremity Assessment: LLE deficits/detail   LLE Deficits / Details: no order for ROM or ther ex for L knee, pt able to PF/DF, assist to adduct/abduct LE in KI  Cervical / Trunk Assessment: Normal  Communication   Communication: No difficulties  Cognition Arousal/Alertness: Awake/alert Behavior During Therapy: WFL for tasks assessed/performed Overall Cognitive Status: Within Functional  Limits for tasks assessed                      General Comments General comments (skin integrity, edema, etc.): L LE very swollen    Exercises     Assessment/Plan    PT Assessment Patient needs continued PT services  PT Problem List Decreased strength;Decreased range of motion;Decreased activity tolerance;Decreased balance;Decreased mobility;Decreased coordination          PT Treatment Interventions DME instruction;Gait training;Functional mobility training;Therapeutic activities;Therapeutic exercise;Balance  training    PT Goals (Current goals can be found in the Care Plan section)  Acute Rehab PT Goals Patient Stated Goal: home PT Goal Formulation: With patient Time For Goal Achievement: 01/20/16 Potential to Achieve Goals: Good    Frequency Min 5X/week   Barriers to discharge        Co-evaluation               End of Session Equipment Utilized During Treatment: Gait belt;Left knee immobilizer Activity Tolerance: Patient tolerated treatment well Patient left: in chair;with call bell/phone within reach Nurse Communication: Mobility status;Patient requests pain meds         Time: [id number] PT Time Calculation (min) (ACUTE ONLY): 29 min   Charges:   PT Evaluation $PT Eval Moderate Complexity: 1 Procedure PT Treatments $Gait Training: 8-22 mins   PT G CodesKingsley Callander 01/13/2016, 4:43 PM   Kittie Plater, PT, DPT Pager #: [phone] Office #: [phone]

## 2016-01-13 NOTE — Progress Notes (Signed)
[facility]  Active pt with [facility] prior to this readmission.   [facility] providing HHRN and Home infusion Pharmacy teams for home IV ABX.  [facility] hospital team will follow [name] while an inpatient to support transition home when ordered.   If patient discharges after hours, please call [phone].   Steven Charles 01/13/2016, 2:37 PM

## 2016-01-13 NOTE — Progress Notes (Signed)
[facility] for Infectious Disease    Date of Admission:  01/12/2016   Total days of antibiotics 2        Day 2 ceftriaxone        Day 2 vanco           ID: Steven Charles is a 70 y.o. male with hx of staph ludgenensis pji POD#1 s/p repeat I x d with new abtx spacer Principal Problem:   Prosthetic joint infection ([place]) Active Problems:   Chronic infection of prosthetic knee ([facility])    Subjective: Had episode of chills last night but now better. Still having pain with knee since surgery Medications:  . alfuzosin  10 mg Oral Q breakfast  . aspirin EC  325 mg Oral Daily  . atorvastatin  40 mg Oral QPM  . calcium-vitamin D  1 tablet Oral BID  . carvedilol  12.5 mg Oral BID WC  . cefTRIAXone (ROCEPHIN)  IV  2 g Intravenous Q24H  . docusate sodium  100 mg Oral BID  . finasteride  5 mg Oral Daily  . FLUoxetine  40 mg Oral Daily  . furosemide  20 mg Oral BID  . gabapentin  300 mg Oral BID  . insulin aspart  0-15 Units Subcutaneous TID WC  . insulin aspart  0-5 Units Subcutaneous QHS  . lisinopril  20 mg Oral QHS  . multivitamin with minerals  1 tablet Oral Daily  . pantoprazole  40 mg Oral BID  . potassium chloride SA  20 mEq Oral Daily  . vancomycin  750 mg Intravenous Q12H  . vitamin C  500 mg Oral Daily    Objective: Vital signs in last 24 hours: Temp:  [97.7 F (36.5 C)-99.9 F (37.7 C)] 98.3 F (36.8 C) (10/18 1344) Pulse Rate:  [61-79] 74 (10/18 1344) Resp:  [11-20] 18 (10/18 1344) BP: (97-155)/(44-77) 97/44 (10/18 1344) SpO2:  [93 %-100 %] 98 % (10/18 1344) Weight:  [252 lb 3.2 oz (114.4 kg)] 252 lb 3.2 oz (114.4 kg) (10/17 2006) Physical Exam  Constitutional: He is oriented to person, place, and time. He appears well-developed and well-nourished. No distress.  HENT:  Mouth/Throat: Oropharynx is clear and moist. No oropharyngeal exudate.  Cardiovascular: Normal rate, regular rhythm and normal heart sounds. Exam reveals no gallop and no friction rub.  No  murmur heard.  Pulmonary/Chest: Effort normal and breath sounds normal. No respiratory distress. He has no wheezes.  Abdominal: Soft. Bowel sounds are normal. He exhibits no distension. There is no tenderness.  Ext: left knee immobilized Neurological: He is alert and oriented to person, place, and time.  Skin: Skin is warm and dry. No rash noted. No erythema.  Psychiatric: He has a normal mood and affect. His behavior is normal.     Lab Results  Recent Labs  01/13/16 0450  WBC 7.0  HGB 9.8*  HCT 30.3*  NA 134*  K 3.9  CL 99*  CO2 28  BUN 10  CREATININE 0.90  Sedimentation Rate  Recent Labs  01/13/16 0450  ESRSEDRATE 50*   C-Reactive Protein  Recent Labs  01/13/16 0450  CRP 1.6*    Microbiology: OR Cx + GPCs, ID pending Studies/Results: No results found.   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 Staph ludgenensis, despite being a coagulase negative staph species can be as aggressive as  staph aureus. Some isolate do contain the mecA gene that is responsible for methicillin resistance.  - continue on vancomycin only - await culture results from this surgery to see if need to change regimen to  possibly high dose daptomycin vs. vancomycin as alternative to treatment - will start rifampin tomorrow or thur after more culture results return   [facility], [facility] Cell: [phone] Pager: [phone]  01/13/2016, 5:05 PM

## 2016-01-13 NOTE — Op Note (Signed)
NAMEMarland Kitchen  JEAN-PAUL, KOSH NO.:  [id number]  MEDICAL RECORD NO.:  [id number]  LOCATION:  5W11C                        FACILITY:  [facility]  PHYSICIAN:  Lind Guest. 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  PROCEDURE: 1. Removal of previous antibiotic spacer, left knee. 2. Repeat irrigation, debridement, and synovectomy, left knee. 3. Removal of retained pin, left knee. 4. Placement of new antibiotic spacer with vancomycin and gentamicin     laden bone cement with closure of the wound, left knee.  FINDINGS:  Thick fluid within the knee suggestive of further infection. Gram stain and cultures pending.  SURGEON:  Lind Guest. Ninfa Linden, M.D.  ASSISTANT:  Erskine Emery, PA-C.  ANTIBIOTICS:  2 g of IV Ancef.  BLOOD LOSS:  50 mL to 100 mL.  TOURNIQUET TIME:  Under 2 hours.  COMPLICATIONS:  None.  INDICATIONS:  Mr. 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  In July of this year, he underwent excision arthroplasty and placement of antibiotic spacer.  He still had one retained pin from previous trauma in his patella, but this is very deep in the bone.  He has been on 3 months of IV antibiotics and continues to have swelling in his knee and his infectious parameter labs including CRP and sed rate have remained elevated.  His white blood cell count has remained normal.  Due to these findings, it has been recommended he undergo a repeat irrigation, debridement, removal of previous antibiotic spacer, and removal of the retained pin from the knee,  synovectomy, and then repeat I and D with new spacer placement.  He understands that this is still a salvage operation situation with hopes of clearing the infection at some point, so we can probably fuse his knee.  Risks and benefits of the surgery were explained to him in detail and he did wish to proceed.  PROCEDURE DESCRIPTION:  After informed consent was obtained, appropriate left knee was marked.  He was brought to the operating room, placed supine on the operating table.  General anesthesia was then obtained.  A nonsterile tourniquet was placed around his upper left thigh.  His left thigh, knee, leg, and ankle were prepped and draped with DuraPrep and sterile drapes.  Sterile stockinette was utilized as well.  A time-out was called and he was identified as correct patient and correct left knee.  I then made an incision over his previous incision, carried this proximally and distally through a significant amount of scar tissue.  I dissected down the knee joint and carried out arthrotomy medially and found a large joint effusion and certainly fluid suspicious for infection.  It was not big, but certainly it had __________ it.  We sent cultures off from this.  We then opened up the knee fully, removed the previous antibiotic spacer,  and then used a rongeur and curettes and Bovie and knife to perform a partial synovectomy with removal of necrotic soft tissue, fascia, as well as bone.  I then was able to dig to the patella and find the retained pin, which I removed in its entirety.  Once we felt like we had thoroughly debrided the knee, we then irrigated the knee with 3 L of normal saline solution using pulsatile lavage.  We then mixed a new batch of cement and mixed this with vancomycin and gentamicin.  Once the cement began to harden, we put some traction on the knee and we placed this as a spacer inside the knee arthrotomy itself.  We then tried to close as much tissue as we  can deeply with a combination of #1 Ethibond and #1 Vicryl suture followed by 2-0 Vicryl in the subcutaneous tissue and interrupted 2-0 nylon on the skin.  Xeroform and well-padded sterile dressing were applied.  He was placed in knee immobilizer, awakened, extubated, and taken to the recovery room in a stable condition.  All final counts were correct. There were no complications noted.     Lind Guest. Ninfa Linden, M.D.     CYB/MEDQ  D:  01/12/2016  T:  01/13/2016  Job:  [id number]

## 2016-01-14 LAB — GLUCOSE, CAPILLARY
GLUCOSE-CAPILLARY: 123 mg/dL — AB (ref 65–99)
GLUCOSE-CAPILLARY: 123 mg/dL — AB (ref 65–99)
GLUCOSE-CAPILLARY: 150 mg/dL — AB (ref 65–99)
GLUCOSE-CAPILLARY: 92 mg/dL (ref 65–99)

## 2016-01-14 NOTE — Progress Notes (Signed)
Patient ID: Steven Charles, male   DOB: Apr 15, 1945, 70 y.o.   MRN: [id number] No acute changes.  Dressing changed at the bedside and knee looks less swollen and no redness.  Awaiting final culture results.  Too much pain with 50% weight-bearing, so will need to back off to just touch down weight.  Discharge to home once final recs for antibiotics.

## 2016-01-14 NOTE — Progress Notes (Signed)
[facility]  Spoke with Dr. Carlyle Basques last PM requesting insurance verification/coverage for possible switch to Daptomycin for home. Due to possible high copay, [facility] will verify insurance coverage this a.m. and contact Dr. Baxter Flattery to support best plan for patient for home once copay known.   If patient discharges after hours, please call [phone].   Steven Charles 01/14/2016, 6:42 AM

## 2016-01-14 NOTE — Progress Notes (Signed)
Physical Therapy Treatment Patient Details Name: Steven Charles MRN: [id number] DOB: [birth year] Today's Date: 01/14/2016    History of Present Illness 70 yo male who a few months ago underwent excision of all left total knee components due to a chronic infection. He has an antibiotic space in his knee.  Pt underwent removal of previous antibiotic spacer L knee, repeat I and D, synovectomy, L knee and placement of new antibiotic spacer with cancomycin on 10/17.    PT Comments    Patient is progressing toward mobility goals. Maintained NWB to TDWB L LE throughout session due to pain and leg length discrepancy. Current plan remains appropriate.   Follow Up Recommendations  Home health PT;Supervision/Assistance - 24 hour (may progress and not need)     Equipment Recommendations  None recommended by PT    Recommendations for Other Services       Precautions / Restrictions Precautions Precautions: Fall Required Braces or Orthoses: Knee Immobilizer - Left Knee Immobilizer - Left: On at all times Restrictions Weight Bearing Restrictions: Yes LLE Weight Bearing: Partial weight bearing LLE Partial Weight Bearing Percentage or Pounds: 50%    Mobility  Bed Mobility Overal bed mobility: Needs Assistance Bed Mobility: Supine to Sit     Supine to sit: HOB elevated;Min assist     General bed mobility comments: assist to mobilize L LE and to lower from EOB  Transfers Overall transfer level: Needs assistance Equipment used: Rolling walker (2 wheeled) Transfers: Sit to/from Stand Sit to Stand: Min guard         General transfer comment: increased time; safe hand placement  Ambulation/Gait Ambulation/Gait assistance: Min assist Ambulation Distance (Feet): 75 Feet Assistive device: Rolling walker (2 wheeled) Gait Pattern/deviations: Step-to pattern;Antalgic Gait velocity: slow   General Gait Details: pt maintained NWB and TDWB L LE throughout session; cues for safe use of AD;  c/o pain in R hip with ambulation   Stairs            Wheelchair Mobility    Modified Rankin (Stroke Patients Only)       Balance                                    Cognition Arousal/Alertness: Awake/alert Behavior During Therapy: WFL for tasks assessed/performed Overall Cognitive Status: Within Functional Limits for tasks assessed                      Exercises      General Comments        Pertinent Vitals/Pain Pain Assessment: Faces Faces Pain Scale: Hurts little more Pain Location: L knee and R hip with ambulation Pain Descriptors / Indicators: Aching;Sore;Guarding Pain Intervention(s): Limited activity within patient's tolerance;Monitored during session;Premedicated before session;Repositioned    Home Living                      Prior Function            PT Goals (current goals can now be found in the care plan section) Acute Rehab PT Goals Patient Stated Goal: home PT Goal Formulation: With patient Time For Goal Achievement: 01/20/16 Potential to Achieve Goals: Good Progress towards PT goals: Progressing toward goals    Frequency    Min 5X/week      PT Plan Current plan remains appropriate    Co-evaluation  End of Session Equipment Utilized During Treatment: Gait belt;Left knee immobilizer Activity Tolerance: Patient tolerated treatment well Patient left: in chair;with call bell/phone within reach     Time: 1315-1350 PT Time Calculation (min) (ACUTE ONLY): 35 min  Charges:  $Gait Training: 8-22 mins $Therapeutic Activity: 8-22 mins                    G Codes:      Salina April, PTA Pager: [phone]   01/14/2016, 2:05 PM

## 2016-01-15 DIAGNOSIS — T8454XD Infection and inflammatory reaction due to internal left knee prosthesis, subsequent encounter: Secondary | ICD-10-CM

## 2016-01-15 DIAGNOSIS — M00062 Staphylococcal arthritis, left knee: Secondary | ICD-10-CM

## 2016-01-15 LAB — BASIC METABOLIC PANEL
ANION GAP: 6 (ref 5–15)
BUN: 8 mg/dL (ref 6–20)
CALCIUM: 8.8 mg/dL — AB (ref 8.9–10.3)
CHLORIDE: 107 mmol/L (ref 101–111)
CO2: 25 mmol/L (ref 22–32)
Creatinine, Ser: 0.76 mg/dL (ref 0.61–1.24)
GFR calc non Af Amer: >60 mL/min (ref >60)
GLUCOSE: 95 mg/dL (ref 65–99)
POTASSIUM: 3.7 mmol/L (ref 3.5–5.1)
Sodium: 138 mmol/L (ref 135–145)

## 2016-01-15 LAB — GLUCOSE, CAPILLARY
GLUCOSE-CAPILLARY: 127 mg/dL — AB (ref 65–99)
GLUCOSE-CAPILLARY: 86 mg/dL (ref 65–99)
GLUCOSE-CAPILLARY: 115 mg/dL — AB (ref 65–99)
Glucose-Capillary: 101 mg/dL — ABNORMAL HIGH (ref 65–99)

## 2016-01-15 LAB — VANCOMYCIN, TROUGH: Vancomycin Tr: 11 ug/mL — ABNORMAL LOW (ref 15–20)

## 2016-01-15 MED ORDER — VANCOMYCIN HCL 10 G IV SOLR
1250.0000 mg | Freq: Two times a day (BID) | INTRAVENOUS | Status: DC
  Administered 2016-01-15 – 2016-01-18 (×8): 1250 mg via INTRAVENOUS
  Filled 2016-01-15 (×10): qty 1250

## 2016-01-15 NOTE — Progress Notes (Signed)
Physical Therapy Treatment Patient Details Name: Steven Charles MRN: [id number] DOB: May 16, 1945 Today's Date: 01/15/2016    History of Present Illness 70 yo male who a few months ago underwent excision of all left total knee components due to a chronic infection. He has an antibiotic space in his knee.  Pt underwent removal of previous antibiotic spacer L knee, repeat I and D, synovectomy, L knee and placement of new antibiotic spacer with vancomycin on 10/17.    PT Comments    Discussed car transfer and positioning with pt with return demo understanding.  Pt able to tolerate mobility with lessened WB status (changed from 50% PWB to TDWB per verbal conversation with Dr Rush Farmer during PT tx with pt). Pt has accessible home and all DME.  Will continue to follow patient while on this venue of care to progress mobility.   Follow Up Recommendations  Home health PT;Supervision/Assistance - 24 hour     Equipment Recommendations  None recommended by PT    Recommendations for Other Services       Precautions / Restrictions Precautions Precautions: Fall Required Braces or Orthoses: Knee Immobilizer - Left Knee Immobilizer - Left: On at all times Restrictions Weight Bearing Restrictions: Yes LLE Weight Bearing: Touchdown weight bearing Other Position/Activity Restrictions: weight bearing status changed since evaluation    Mobility  Bed Mobility Overal bed mobility: Needs Assistance Bed Mobility: Sit to Supine       Sit to supine: Min assist   General bed mobility comments: assist for left leg positioning  Transfers Overall transfer level: Needs assistance Equipment used: Rolling walker (2 wheeled) Transfers: Sit to/from [facility] Sit to Stand: Min assist Stand pivot transfers: Min assist       General transfer comment: min cues for sequencing stand pivot  Ambulation/Gait Ambulation/Gait assistance: Min assist Ambulation Distance (Feet): 110  Feet Assistive device: Rolling walker (2 wheeled) Gait Pattern/deviations: Step-to pattern;Antalgic     General Gait Details: able to maintain TDWB status left leg during gait, 3 standing rest breaks to manage fatigue   Stairs            Wheelchair Mobility    Modified Rankin (Stroke Patients Only)       Balance Overall balance assessment: Needs assistance Sitting-balance support: No upper extremity supported;Feet supported Sitting balance-Leahy Scale: Good     Standing balance support: Bilateral upper extremity supported Standing balance-Leahy Scale: Poor                      Cognition Arousal/Alertness: Awake/alert Behavior During Therapy: WFL for tasks assessed/performed Overall Cognitive Status: Within Functional Limits for tasks assessed                      Exercises Total Joint Exercises Ankle Circles/Pumps: AROM;Both;10 reps;Seated Quad Sets: AROM;Left;10 reps;Seated Gluteal Sets: AROM;Both;10 reps;Seated Towel Squeeze: AROM;Both;10 reps;Seated Hip ABduction/ADduction: AROM;10 reps;Seated (isometric pillow squeeze)    General Comments General comments (skin integrity, edema, etc.): dressing clean, dry, intact.  Small eraser-head sized closed blister left lateral proximal thigh      Pertinent Vitals/Pain Pain Assessment: 0-10 Pain Score: 6  Pain Location: left knee Pain Descriptors / Indicators: Aching;Sore Pain Intervention(s): Limited activity within patient's tolerance;Premedicated before session;Repositioned    Home Living                      Prior Function            PT  Goals (current goals can now be found in the care plan section) Acute Rehab PT Goals Patient Stated Goal: Heal so I can go home PT Goal Formulation: With patient Time For Goal Achievement: 01/20/16 Potential to Achieve Goals: Good Progress towards PT goals: Progressing toward goals    Frequency    Min 5X/week      PT Plan Current plan  remains appropriate    Co-evaluation             End of Session Equipment Utilized During Treatment: Gait belt;Left knee immobilizer Activity Tolerance: Patient tolerated treatment well;Patient limited by fatigue Patient left: in bed;with call bell/phone within reach;with bed alarm set     Time: [id number] PT Time Calculation (min) (ACUTE ONLY): 36 min  Charges:  $Gait Training: 8-22 mins $Therapeutic Exercise: 8-22 mins                    G CodesMalka So, PT [phone]  [place] 01/15/2016, 4:50 PM

## 2016-01-15 NOTE — Progress Notes (Signed)
Patient will need medication override for insurance to cover if going home on vanc.  Thx

## 2016-01-15 NOTE — Progress Notes (Signed)
Pharmacy Antibiotic Note  Steven Charles is a 70 y.o. male admitted on 01/12/2016 with an infected TKA s/p I&D, removal of the antibiotc spacer and placement of a new spacer, and removal of a retained pin in the patella.  Pharmacy has been consulted for vancomycin dosing.  10/20 VT 11 with vanc 750mg  IV Q12H   Plan: Vancomycin 1250 IV every 12 hours.  Goal trough 15-20 mcg/mL.  F/u renal fxn, clinical progress, trough at SS   Height: 5\' 10"  (177.8 cm) Weight: 252 lb 3.2 oz (114.4 kg) IBW/kg (Calculated) : 73  Temp (24hrs), Avg:97.9 F (36.6 C), Min:97.4 F (36.3 C), Max:98.2 F (36.8 C)   Recent Labs Lab 01/13/16 0450 01/15/16 0836 01/15/16 0837  WBC 7.0  --   --   CREATININE 0.90  --  0.76  VANCOTROUGH  --  11*  --     Estimated Creatinine Clearance: 105.8 mL/min (by C-G formula based on SCr of 0.76 mg/dL).    Allergies  Allergen Reactions  . Nicoderm [Nicotine] Other (See Comments)    Heart rate dropped   . 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  [place] Student 01/15/2016 10:44 AM

## 2016-01-15 NOTE — Care Management Important Message (Signed)
Important Message  Patient Details  Name: Steven Charles MRN: [id number] Date of Birth: 23-Feb-1946   Medicare Important Message Given:  Yes    [name] May 01/15/2016, 12:38 PM

## 2016-01-15 NOTE — Progress Notes (Signed)
Patient ID: Steven Charles, male   DOB: March 12, 1946, 70 y.o.   MRN: [id number] No acute changes.  Will keep on Vancomycin per Infectious Disease until final sensitivities.  Will likely be here thru the weekend.

## 2016-01-15 NOTE — Progress Notes (Signed)
[facility] for Infectious Disease    Date of Admission:  01/12/2016   Total days of antibiotics 4        Day 4 ceftriaxone        Day 4 vanco           ID: Steven Charles is a 70 y.o. 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 Still having pain with knee since surgery Medications:  . alfuzosin  10 mg Oral Q breakfast  . aspirin EC  325 mg Oral Daily  . atorvastatin  40 mg Oral QPM  . calcium-vitamin D  1 tablet Oral BID  . carvedilol  12.5 mg Oral BID WC  . docusate sodium  100 mg Oral BID  . finasteride  5 mg Oral Daily  . FLUoxetine  40 mg Oral Daily  . furosemide  20 mg Oral BID  . gabapentin  300 mg Oral BID  . insulin aspart  0-15 Units Subcutaneous TID WC  . insulin aspart  0-5 Units Subcutaneous QHS  . lisinopril  20 mg Oral QHS  . multivitamin with minerals  1 tablet Oral Daily  . pantoprazole  40 mg Oral BID  . potassium chloride SA  20 mEq Oral Daily  . vancomycin  1,250 mg Intravenous Q12H  . vitamin C  500 mg Oral Daily    Objective: Vital signs in last 24 hours: Temp:  [97.4 F (36.3 C)-99.3 F (37.4 C)] 99.3 F (37.4 C) (10/20 1557) Pulse Rate:  [63-69] 69 (10/20 1557) Resp:  [18-19] 19 (10/20 1557) BP: (121-154)/(50-55) 121/50 (10/20 1557) SpO2:  [95 %-98 %] 95 % (10/20 1557) Physical Exam  Constitutional: He is oriented to person, place, and time. He appears well-developed and well-nourished. No distress.  HENT:  Mouth/Throat: Oropharynx is clear and moist. No oropharyngeal exudate.  Cardiovascular: Normal rate, regular rhythm and normal heart sounds. Exam reveals no gallop and no friction rub.  No murmur heard.  Pulmonary/Chest: Effort normal and breath sounds normal. No respiratory distress. He has no wheezes.  Abdominal:  Soft. Bowel sounds are normal. He exhibits no distension. There is no tenderness.  Ext: left knee immobilized Neurological: He is alert and oriented to person, place, and time.  Skin: Skin is warm and dry. No rash noted. No erythema.  Psychiatric: He has a normal mood and affect. His behavior is normal.     Lab Results  Recent Labs  01/13/16 0450 01/15/16 0837  WBC 7.0  --   HGB 9.8*  --   HCT 30.3*  --   NA 134* 138  K 3.9 3.7  CL 99* 107  CO2 28 25  BUN 10 8  CREATININE 0.90 0.76  Sedimentation Rate  Recent Labs  01/13/16 0450  ESRSEDRATE 50*   C-Reactive Protein  Recent Labs  01/13/16 0450  CRP 1.6*    Microbiology: OR Cx + GPCs, ID pending Studies/Results: No results found.   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 Staph ludgenensis, despite being a coagulase negative staph species can be as aggressive as staph aureus. Some isolate do contain the mecA gene that is responsible  for methicillin resistance.  - continue on vancomycin only - await culture results from this surgery to see if need to change regimen - will not need rifampin since no residual hardware or pins in place only abtx spacer - will plan for 6 wk of iv abtx  Dr Lucianne Lei dam to see over the weekend  Home health orders listed below  Diagnosis: Prosthetic joint infection  Culture Result: staph  Allergies  Allergen Reactions  . Nicoderm [Nicotine] Other (See Comments)    Heart rate dropped   . Adhesive [Tape] Itching and Rash    Please use "paper" tape    Discharge antibiotics: Per pharmacy protocol : vancomycin Aim for Vancomycin trough 15-20 (unless otherwise indicated) Duration: 6 wk End Date: Nov 29th  [place] Per Protocol:  Labs weekly while on IV antibiotics: _x_ CBC with differential _x BMP twice a week __ CMP x__  CRP _x_ ESR _x_ Vancomycin trough  __ Please pull PIC at completion of IV antibiotics _x_ Please leave PIC in place until doctor has seen patient or been notified  Fax weekly labs to [phone]  Clinic Follow Up Appt: 4-6 wk with dr Lucianne Lei dam  @ [email or web address], [facility] for Infectious Diseases Cell: [phone] Pager: [phone]  01/15/2016, 5:36 PM

## 2016-01-16 DIAGNOSIS — R2689 Other abnormalities of gait and mobility: Secondary | ICD-10-CM

## 2016-01-16 DIAGNOSIS — B9689 Other specified bacterial agents as the cause of diseases classified elsewhere: Secondary | ICD-10-CM

## 2016-01-16 DIAGNOSIS — M00862 Arthritis due to other bacteria, left knee: Secondary | ICD-10-CM

## 2016-01-16 LAB — BASIC METABOLIC PANEL
ANION GAP: 6 (ref 5–15)
BUN: 10 mg/dL (ref 6–20)
CHLORIDE: 105 mmol/L (ref 101–111)
CO2: 26 mmol/L (ref 22–32)
Calcium: 9.2 mg/dL (ref 8.9–10.3)
Creatinine, Ser: 0.79 mg/dL (ref 0.61–1.24)
GFR calc Af Amer: >60 mL/min (ref >60)
GFR calc non Af Amer: >60 mL/min (ref >60)
GLUCOSE: 110 mg/dL — AB (ref 65–99)
POTASSIUM: 3.9 mmol/L (ref 3.5–5.1)
Sodium: 137 mmol/L (ref 135–145)

## 2016-01-16 LAB — CBC
HCT: 26.8 % — ABNORMAL LOW (ref 39.0–52.0)
HEMOGLOBIN: 8.8 g/dL — AB (ref 13.0–17.0)
MCH: 28.2 pg (ref 26.0–34.0)
MCHC: 32.8 g/dL (ref 30.0–36.0)
MCV: 85.9 fL (ref 78.0–100.0)
Platelets: 137 10*3/uL — ABNORMAL LOW (ref 150–400)
RBC: 3.12 MIL/uL — ABNORMAL LOW (ref 4.22–5.81)
RDW: 14.1 % (ref 11.5–15.5)
WBC: 4.8 10*3/uL (ref 4.0–10.5)

## 2016-01-16 LAB — GLUCOSE, CAPILLARY
GLUCOSE-CAPILLARY: 106 mg/dL — AB (ref 65–99)
Glucose-Capillary: 94 mg/dL (ref 65–99)
Glucose-Capillary: 96 mg/dL (ref 65–99)

## 2016-01-16 LAB — C-REACTIVE PROTEIN: CRP: 4.7 mg/dL — ABNORMAL HIGH (ref <1.0)

## 2016-01-16 LAB — SEDIMENTATION RATE: Sed Rate: 75 mm/hr — ABNORMAL HIGH (ref 0–16)

## 2016-01-16 NOTE — Progress Notes (Signed)
Patient is stable, dressing dry, exam stable Tolerating abx well - cultures are pending ID on board  - appreciate their assistance Will need med override for vanc if he ends up needing that per ID recs.  Azucena Cecil, MD [place] 8:51 AM

## 2016-01-16 NOTE — Progress Notes (Signed)
Physical Therapy Treatment Patient Details Name: Steven Charles MRN: [id number] DOB: [birth year] Today's Date: 01/16/2016    History of Present Illness 70 yo male who a few months ago underwent excision of all left total knee components due to a chronic infection. He has an antibiotic space in his knee.  Pt underwent removal of previous antibiotic spacer L knee, repeat I and D, synovectomy, L knee and placement of new antibiotic spacer with vancomycin on 10/17.    PT Comments    Patient making progress with gait and activity tolerance.  Maintaining NWB on LLE during gait.  Follow Up Recommendations  Home health PT;Supervision/Assistance - 24 hour     Equipment Recommendations  None recommended by PT    Recommendations for Other Services       Precautions / Restrictions Precautions Precautions: Fall Required Braces or Orthoses: Knee Immobilizer - Left Knee Immobilizer - Left: On at all times Restrictions Weight Bearing Restrictions: Yes LLE Weight Bearing: Touchdown weight bearing Other Position/Activity Restrictions: weight bearing status changed since evaluation    Mobility  Bed Mobility               General bed mobility comments: Patient in chair  Transfers Overall transfer level: Needs assistance Equipment used: Rolling walker (2 wheeled) Transfers: Sit to/from Stand Sit to Stand: Min assist         General transfer comment: Assist to manage LLE to lower leg rest and scoot to edge of chair.  Assist to steady during transfers.  Ambulation/Gait Ambulation/Gait assistance: Min assist Ambulation Distance (Feet): 160 Feet (with 3 standing rest breaks) Assistive device: Rolling walker (2 wheeled) Gait Pattern/deviations: Step-to pattern;Decreased stride length (Hop-to on RLE) Gait velocity: decreased Gait velocity interpretation: Below normal speed for age/gender General Gait Details: Patient ambulating with RW using NWB on LLE.  Patient reports it is  painful to attempt to put LLE on floor.  Good UE strength to ambulate 160', NWB on LLE.   Stairs            Wheelchair Mobility    Modified Rankin (Stroke Patients Only)       Balance           Standing balance support: Bilateral upper extremity supported Standing balance-Leahy Scale: Poor                      Cognition Arousal/Alertness: Awake/alert Behavior During Therapy: WFL for tasks assessed/performed Overall Cognitive Status: Within Functional Limits for tasks assessed                      Exercises      General Comments        Pertinent Vitals/Pain Pain Assessment: 0-10 Pain Score: 5  Pain Location: LLE Pain Descriptors / Indicators: Aching;Sore;Grimacing;Guarding Pain Intervention(s): Monitored during session;Premedicated before session;Repositioned    Home Living                      Prior Function            PT Goals (current goals can now be found in the care plan section) Acute Rehab PT Goals Patient Stated Goal: Heal so I can go home Progress towards PT goals: Progressing toward goals    Frequency    Min 5X/week      PT Plan Current plan remains appropriate    Co-evaluation             End of Session  Equipment Utilized During Treatment: Gait belt;Left knee immobilizer Activity Tolerance: Patient tolerated treatment well Patient left: in chair;with call bell/phone within reach;with nursing/sitter in room     Time: [id number] PT Time Calculation (min) (ACUTE ONLY): 18 min  Charges:  $Gait Training: 8-22 mins                    G Codes:      Despina Pole 01-27-2016, 1:18 PM Carita Pian. Sanjuana Kava, [place] Pager [phone]

## 2016-01-16 NOTE — Progress Notes (Signed)
Subjective: No new complaints   Antibiotics:  Anti-infectives    Start     Dose/Rate Route Frequency Ordered Stop   01/15/16 1100  vancomycin (VANCOCIN) 1,250 mg in sodium chloride 0.9 % 250 mL IVPB     1,250 mg 166.7 mL/hr over 90 Minutes Intravenous Every 12 hours 01/15/16 1023     01/13/16 1000  vancomycin (VANCOCIN) IVPB 750 mg/150 ml premix  Status:  Discontinued     750 mg 150 mL/hr over 60 Minutes Intravenous Every 12 hours 01/12/16 2025 01/15/16 1023   01/13/16 0000  ceFAZolin (ANCEF) IVPB 2g/100 mL premix  Status:  Discontinued     2 g 200 mL/hr over 30 Minutes Intravenous Every 8 hours 01/12/16 1950 01/12/16 2027   01/12/16 2200  rifampin (RIFADIN) capsule 300 mg  Status:  Discontinued     300 mg Oral 2 times daily 01/12/16 1950 01/12/16 2026   01/12/16 2100  cefTRIAXone (ROCEPHIN) 2 g in dextrose 5 % 50 mL IVPB  Status:  Discontinued     2 g 100 mL/hr over 30 Minutes Intravenous Every 24 hours 01/12/16 1836 01/15/16 1108   01/12/16 2100  vancomycin (VANCOCIN) 2,000 mg in sodium chloride 0.9 % 500 mL IVPB     2,000 mg 250 mL/hr over 120 Minutes Intravenous  Once 01/12/16 2024 01/13/16 0105   01/12/16 1900  vancomycin (VANCOCIN) 2,000 mg in sodium chloride 0.9 % 500 mL IVPB  Status:  Discontinued     2,000 mg 250 mL/hr over 120 Minutes Intravenous To Post Anesthesia Care Unit 01/12/16 1850 01/12/16 2024   01/12/16 1656  vancomycin (VANCOCIN) powder  Status:  Discontinued       As needed 01/12/16 1716 01/12/16 1737   01/12/16 1400  ceFAZolin (ANCEF) 3 g in dextrose 5 % 50 mL IVPB     3 g 130 mL/hr over 30 Minutes Intravenous To ShortStay Surgical 01/11/16 0955 01/12/16 1615      Medications: Scheduled Meds: . alfuzosin  10 mg Oral Q breakfast  . aspirin EC  325 mg Oral Daily  . atorvastatin  40 mg Oral QPM  . calcium-vitamin D  1 tablet Oral BID  . carvedilol  12.5 mg Oral BID WC  . docusate sodium  100 mg Oral BID  . finasteride  5 mg Oral Daily  .  FLUoxetine  40 mg Oral Daily  . furosemide  20 mg Oral BID  . gabapentin  300 mg Oral BID  . insulin aspart  0-15 Units Subcutaneous TID WC  . insulin aspart  0-5 Units Subcutaneous QHS  . lisinopril  20 mg Oral QHS  . multivitamin with minerals  1 tablet Oral Daily  . pantoprazole  40 mg Oral BID  . potassium chloride SA  20 mEq Oral Daily  . vancomycin  1,250 mg Intravenous Q12H  . vitamin C  500 mg Oral Daily   Continuous Infusions: . sodium chloride 75 mL/hr at 01/16/16 1602   PRN Meds:.acetaminophen **OR** acetaminophen, diphenhydrAMINE, HYDROmorphone (DILAUDID) injection, ibuprofen, LORazepam, methocarbamol **OR** methocarbamol (ROBAXIN)  IV, metoCLOPramide **OR** metoCLOPramide (REGLAN) injection, ondansetron **OR** ondansetron (ZOFRAN) IV, oxyCODONE-acetaminophen, polyvinyl alcohol, sodium chloride flush    Objective: Weight change:   Intake/Output Summary (Last 24 hours) at 01/16/16 1910 Last data filed at 01/16/16 1859  Gross per 24 hour  Intake           1092.5 ml  Output  1300 ml  Net           -207.5 ml   Blood pressure 119/65, pulse 60, temperature 98.3 F (36.8 C), temperature source Oral, resp. rate 18, height 5\' 10"  (1.778 m), weight 252 lb 3.2 oz (114.4 kg), SpO2 96 %. Temp:  [97.3 F (36.3 C)-98.3 F (36.8 C)] 98.3 F (36.8 C) (10/21 1402) Pulse Rate:  [54-65] 60 (10/21 1602) Resp:  [18] 18 (10/21 1402) BP: (108-151)/(49-65) 119/65 (10/21 1602) SpO2:  [96 %-97 %] 96 % (10/21 1402)  Physical Exam: General: Alert and awake, oriented x3, not in any acute distress. HEENT: anicteric sclera,  EOMI CVS regular rate, normal r,  no murmur rubs or gallops Chest: no wheezing, rales or rhonchi Abdomen: softnondistended, Extremities:knee with dressing Neuro: nonfocal  CBC:    CBC Latest Ref Rng & Units 01/16/2016 01/13/2016 01/08/2016  WBC 4.0 - 10.5 K/uL 4.8 7.0 6.5  Hemoglobin 13.0 - 17.0 g/dL 8.8(L) 9.8(L) 11.1(L)  Hematocrit 39.0 - 52.0 %  26.8(L) 30.3(L) 34.3(L)  Platelets 150 - 400 K/uL 137(L) 141(L) 174      BMET  Recent Labs  01/15/16 0837 01/16/16 0438  NA 138 137  K 3.7 3.9  CL 107 105  CO2 25 26  GLUCOSE 95 110*  BUN 8 10  CREATININE 0.76 0.79  CALCIUM 8.8* 9.2     Liver Panel  No results for input(s): PROT, ALBUMIN, AST, ALT, ALKPHOS, BILITOT, BILIDIR, IBILI in the last 72 hours.     Sedimentation Rate  Recent Labs  01/16/16 0438  ESRSEDRATE 75*   C-Reactive Protein  Recent Labs  01/16/16 0438  CRP 4.7*    Micro Results: Recent Results (from the past 720 hour(s))  Aerobic/Anaerobic Culture (surgical/deep wound)     Status: None (Preliminary result)   Collection Time: 01/12/16  4:33 PM  Result Value Ref Range Status   Specimen Description KNEE  Final   Special Requests WOUND LEFT  Final   Gram Stain   Final    RARE WBC PRESENT, PREDOMINANTLY PMN FEW GRAM POSITIVE COCCI IN PAIRS IN CLUSTERS    Culture   Final    NO GROWTH 2 DAYS NO ANAEROBES ISOLATED; CULTURE IN PROGRESS FOR 5 DAYS   Report Status PENDING  Incomplete    Studies/Results: No results found.    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 Staph ludgenensis, despite being a coagulase negative staph species can be as aggressive as staph aureus. Some isolate do contain the mecA gene that is responsible for methicillin resistance. 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   Yes one could argue that the foreign body in knee cap may have played a role and I believe it  likely did but I think covering for possibility of MR Staph Lugdnunensis is reasonable and would vote to proceed with this strategy if insurance will allow it     LOS: 4 days   Alcide Evener 01/16/2016, 7:10 PM

## 2016-01-17 DIAGNOSIS — Z91048 Other nonmedicinal substance allergy status: Secondary | ICD-10-CM

## 2016-01-17 DIAGNOSIS — Z888 Allergy status to other drugs, medicaments and biological substances status: Secondary | ICD-10-CM

## 2016-01-17 LAB — GLUCOSE, CAPILLARY
GLUCOSE-CAPILLARY: 116 mg/dL — AB (ref 65–99)
GLUCOSE-CAPILLARY: 120 mg/dL — AB (ref 65–99)
Glucose-Capillary: 105 mg/dL — ABNORMAL HIGH (ref 65–99)
Glucose-Capillary: 96 mg/dL (ref 65–99)

## 2016-01-17 LAB — SEDIMENTATION RATE: Sed Rate: 63 mm/hr — ABNORMAL HIGH (ref 0–16)

## 2016-01-17 LAB — C-REACTIVE PROTEIN: CRP: 1.9 mg/dL — AB (ref <1.0)

## 2016-01-17 LAB — HIV ANTIBODY (ROUTINE TESTING W REFLEX): HIV Screen 4th Generation wRfx: NONREACTIVE

## 2016-01-17 NOTE — Progress Notes (Signed)
Subjective: No new complaints   Antibiotics:  Anti-infectives    Start     Dose/Rate Route Frequency Ordered Stop   01/15/16 1100  vancomycin (VANCOCIN) 1,250 mg in sodium chloride 0.9 % 250 mL IVPB     1,250 mg 166.7 mL/hr over 90 Minutes Intravenous Every 12 hours 01/15/16 1023     01/13/16 1000  vancomycin (VANCOCIN) IVPB 750 mg/150 ml premix  Status:  Discontinued     750 mg 150 mL/hr over 60 Minutes Intravenous Every 12 hours 01/12/16 2025 01/15/16 1023   01/13/16 0000  ceFAZolin (ANCEF) IVPB 2g/100 mL premix  Status:  Discontinued     2 g 200 mL/hr over 30 Minutes Intravenous Every 8 hours 01/12/16 1950 01/12/16 2027   01/12/16 2200  rifampin (RIFADIN) capsule 300 mg  Status:  Discontinued     300 mg Oral 2 times daily 01/12/16 1950 01/12/16 2026   01/12/16 2100  cefTRIAXone (ROCEPHIN) 2 g in dextrose 5 % 50 mL IVPB  Status:  Discontinued     2 g 100 mL/hr over 30 Minutes Intravenous Every 24 hours 01/12/16 1836 01/15/16 1108   01/12/16 2100  vancomycin (VANCOCIN) 2,000 mg in sodium chloride 0.9 % 500 mL IVPB     2,000 mg 250 mL/hr over 120 Minutes Intravenous  Once 01/12/16 2024 01/13/16 0105   01/12/16 1900  vancomycin (VANCOCIN) 2,000 mg in sodium chloride 0.9 % 500 mL IVPB  Status:  Discontinued     2,000 mg 250 mL/hr over 120 Minutes Intravenous To Post Anesthesia Care Unit 01/12/16 1850 01/12/16 2024   01/12/16 1656  vancomycin (VANCOCIN) powder  Status:  Discontinued       As needed 01/12/16 1716 01/12/16 1737   01/12/16 1400  ceFAZolin (ANCEF) 3 g in dextrose 5 % 50 mL IVPB     3 g 130 mL/hr over 30 Minutes Intravenous To ShortStay Surgical 01/11/16 0955 01/12/16 1615      Medications: Scheduled Meds: . alfuzosin  10 mg Oral Q breakfast  . aspirin EC  325 mg Oral Daily  . atorvastatin  40 mg Oral QPM  . calcium-vitamin D  1 tablet Oral BID  . carvedilol  12.5 mg Oral BID WC  . docusate sodium  100 mg Oral BID  . finasteride  5 mg Oral Daily  .  FLUoxetine  40 mg Oral Daily  . furosemide  20 mg Oral BID  . gabapentin  300 mg Oral BID  . insulin aspart  0-15 Units Subcutaneous TID WC  . insulin aspart  0-5 Units Subcutaneous QHS  . lisinopril  20 mg Oral QHS  . multivitamin with minerals  1 tablet Oral Daily  . pantoprazole  40 mg Oral BID  . potassium chloride SA  20 mEq Oral Daily  . vancomycin  1,250 mg Intravenous Q12H  . vitamin C  500 mg Oral Daily   Continuous Infusions: . sodium chloride 75 mL/hr at 01/16/16 1602   PRN Meds:.acetaminophen **OR** acetaminophen, diphenhydrAMINE, HYDROmorphone (DILAUDID) injection, ibuprofen, LORazepam, methocarbamol **OR** methocarbamol (ROBAXIN)  IV, metoCLOPramide **OR** metoCLOPramide (REGLAN) injection, ondansetron **OR** ondansetron (ZOFRAN) IV, oxyCODONE-acetaminophen, polyvinyl alcohol, sodium chloride flush    Objective: Weight change:   Intake/Output Summary (Last 24 hours) at 01/17/16 1659 Last data filed at 01/17/16 1449  Gross per 24 hour  Intake             1510 ml  Output  4025 ml  Net            -2515 ml   Blood pressure (!) 125/56, pulse (!) 59, temperature 98.3 F (36.8 C), temperature source Oral, resp. rate 20, height 5' 10"  (1.778 m), weight 252 lb 3.2 oz (114.4 kg), SpO2 96 %. Temp:  [98.2 F (36.8 C)-98.3 F (36.8 C)] 98.3 F (36.8 C) (10/22 1449) Pulse Rate:  [52-62] 59 (10/22 1449) Resp:  [18-20] 20 (10/22 1449) BP: (112-145)/(49-56) 125/56 (10/22 1449) SpO2:  [96 %-98 %] 96 % (10/22 1449)  Physical Exam: General: Alert and awake, oriented x3, not in any acute distress. HEENT: anicteric sclera,  EOMI CVS regular rate, normal r,  no murmur rubs or gallops Chest: no wheezing, rales or rhonchi Abdomen: softnondistended, Extremities:knee with dressing Neuro: nonfocal  CBC:    CBC Latest Ref Rng & Units 01/16/2016 01/13/2016 01/08/2016  WBC 4.0 - 10.5 K/uL 4.8 7.0 6.5  Hemoglobin 13.0 - 17.0 g/dL 8.8(L) 9.8(L) 11.1(L)  Hematocrit 39.0  - 52.0 % 26.8(L) 30.3(L) 34.3(L)  Platelets 150 - 400 K/uL 137(L) 141(L) 174      BMET  Recent Labs  01/15/16 0837 01/16/16 0438  NA 138 137  K 3.7 3.9  CL 107 105  CO2 25 26  GLUCOSE 95 110*  BUN 8 10  CREATININE 0.76 0.79  CALCIUM 8.8* 9.2     Liver Panel  No results for input(s): PROT, ALBUMIN, AST, ALT, ALKPHOS, BILITOT, BILIDIR, IBILI in the last 72 hours.     Sedimentation Rate  Recent Labs  01/17/16 0553  ESRSEDRATE 63*   C-Reactive Protein  Recent Labs  01/16/16 0438 01/17/16 0553  CRP 4.7* 1.9*    Micro Results: Recent Results (from the past 720 hour(s))  Aerobic/Anaerobic Culture (surgical/deep wound)     Status: None (Preliminary result)   Collection Time: 01/12/16  4:33 PM  Result Value Ref Range Status   Specimen Description KNEE  Final   Special Requests WOUND LEFT  Final   Gram Stain   Final    RARE WBC PRESENT, PREDOMINANTLY PMN FEW GRAM POSITIVE COCCI IN PAIRS IN CLUSTERS    Culture   Final    NO GROWTH 2 DAYS NO ANAEROBES ISOLATED; CULTURE IN PROGRESS FOR 5 DAYS   Report Status PENDING  Incomplete    Studies/Results: No results found.    Assessment/Plan:  INTERVAL HISTORY: cultures still with NGTD for aerboes, anerobes pending  Principal Problem:   Prosthetic joint infection ([place]) Active Problems:   Infection of prosthetic left knee joint ([place])   Other abnormalities of gait and mobility    Steven Charles is a 70 y.o. 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 Staph ludgenensis, despite being a coagulase negative staph species can be as aggressive as staph aureus. Some isolate do contain the mecA gene that is responsible for methicillin resistance. 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   Yes one could argue that the foreign body in knee  cap may have played a role and I believe it likely did but I think covering for possibility of MR Staph Lugdnunensis is reasonable and would vote to proceed with this strategy if insurance will allow it.   Diagnosis: Persistent infection prosthetic knee with removal of FB in knee cap  Culture Result: NO GROWTH  Allergies  Allergen Reactions  . Nicoderm [Nicotine] Other (See Comments)    Heart rate dropped   . Adhesive [Tape] Itching and Rash    Please use "paper" tape    Discharge antibiotics: IV vancomycin Per pharmacy protocol vancomycin Aim for Vancomycin trough 15-20 (unless otherwise indicated) Duration: 6 weeks End Date: November 27th  [place] Per Protocol:  BI-WEEKLY LABS ON IV antibiotics:  _x_ BMP w GFR  Labs weekly while on IV antibiotics: _x_ CBC with differential _x_ CRP x__ ESR _x_ Vancomycin trough  _x_ Please pull PIC at completion of IV antibiotics __ Please leave PIC in place until doctor has seen patient or been notified  Fax weekly labs to [phone]  Clinic Follow Up Appt:  Next 4 weeks  I will leave him on rounding list so that we can followup his cultures but will otherwise sign off  Please call with further questions.      LOS: 5 days   Alcide Evener 01/17/2016, 4:59 PM

## 2016-01-17 NOTE — Progress Notes (Signed)
Patient is stable today.  Dressing is dry.  No acute issues today.  Cultures pending.  Continue IV abx.

## 2016-01-18 LAB — BASIC METABOLIC PANEL
ANION GAP: 7 (ref 5–15)
BUN: 11 mg/dL (ref 6–20)
CALCIUM: 9.2 mg/dL (ref 8.9–10.3)
CO2: 27 mmol/L (ref 22–32)
Chloride: 105 mmol/L (ref 101–111)
Creatinine, Ser: 0.9 mg/dL (ref 0.61–1.24)
GLUCOSE: 108 mg/dL — AB (ref 65–99)
Potassium: 3.9 mmol/L (ref 3.5–5.1)
Sodium: 139 mmol/L (ref 135–145)

## 2016-01-18 LAB — GLUCOSE, CAPILLARY
GLUCOSE-CAPILLARY: 136 mg/dL — AB (ref 65–99)
GLUCOSE-CAPILLARY: 110 mg/dL — AB (ref 65–99)
Glucose-Capillary: 114 mg/dL — ABNORMAL HIGH (ref 65–99)
Glucose-Capillary: 96 mg/dL (ref 65–99)
Glucose-Capillary: 96 mg/dL (ref 65–99)

## 2016-01-18 LAB — VANCOMYCIN, TROUGH: Vancomycin Tr: 15 ug/mL (ref 15–20)

## 2016-01-18 NOTE — Progress Notes (Signed)
Patient ID: Steven Charles, male   DOB: 26-Jan-1946, 70 y.o.   MRN: [id number] Mr. Kohlman reports feeling better and his left knee overall looks better.  The swelling has decreased and the redness is gone.  Awaiting final ID recs before discharge to home.  Currently on Vanc.

## 2016-01-18 NOTE — Progress Notes (Signed)
Pharmacy Antibiotic Note Steven Charles is a 70 y.o. male admitted on 01/12/2016 with an infected TKA s/p I&D, removal of the antibiotc spacer and placement of a new spacer, and removal of a retained pin in the patella.  Pharmacy is currently following for vancomycin dosing.   Vancomycin trough drawn today is therapeutic at 15 (goal 15 -20). Renal function remains stable.   Plan: 1. Vancomycin 1250 IV every 12 hours.  Goal trough 15-20 mcg/mL.  2. Plan for weekly trough unless clinical course dictates otherwise. 3. SCr every 72 hours while inpatient and on vancomycin. 4. Noted plans for 6 weeks of IV antibiotics as outpatient with end date of 02/22/16 per ID.    Height: 5\' 10"  (177.8 cm) Weight: 252 lb 3.2 oz (114.4 kg) IBW/kg (Calculated) : 73  Temp (24hrs), Avg:97.9 F (36.6 C), Min:97.6 F (36.4 C), Max:98.3 F (36.8 C)   Recent Labs Lab 01/13/16 0450 01/15/16 0836 01/15/16 0837 01/16/16 0438 01/18/16 0400 01/18/16 1030  WBC 7.0  --   --  4.8  --   --   CREATININE 0.90  --  0.76 0.79 0.90  --   VANCOTROUGH  --  11*  --   --   --  15    Estimated Creatinine Clearance: 94 mL/min (by C-G formula based on SCr of 0.9 mg/dL).    Allergies  Allergen Reactions  . Nicoderm [Nicotine] Other (See Comments)    Heart rate dropped   . Adhesive [Tape] Itching and Rash    Please use "paper" tape    Antimicrobials this admission: 10/17 ceftriaxone  >> 10/19  10/17 vancomycin  >>   Dose adjustments this admission: 10/20 VT 11 on vanc 750mg  IV Q12H, SCr 0.76  Microbiology results: 10/17 wound: ngtd Hx of S. ludgenensis from wound on multiple occasioins   Thank you for allowing pharmacy to be a part of this patient's care.  Vincenza Hews, PharmD, BCPS 01/18/2016, 12:37 PM Pager: [phone]

## 2016-01-18 NOTE — Discharge Instructions (Signed)
Put only touch-down weight on your left knee. You can get your current dressing wet in the shower daily.

## 2016-01-18 NOTE — Progress Notes (Signed)
Subjective: He is afebrile but his left knee feels slightly warm in comparison to yesterday per the patient   Antibiotics:  Anti-infectives    Start     Dose/Rate Route Frequency Ordered Stop   01/15/16 1100  vancomycin (VANCOCIN) 1,250 mg in sodium chloride 0.9 % 250 mL IVPB     1,250 mg 166.7 mL/hr over 90 Minutes Intravenous Every 12 hours 01/15/16 1023     01/13/16 1000  vancomycin (VANCOCIN) IVPB 750 mg/150 ml premix  Status:  Discontinued     750 mg 150 mL/hr over 60 Minutes Intravenous Every 12 hours 01/12/16 2025 01/15/16 1023   01/13/16 0000  ceFAZolin (ANCEF) IVPB 2g/100 mL premix  Status:  Discontinued     2 g 200 mL/hr over 30 Minutes Intravenous Every 8 hours 01/12/16 1950 01/12/16 2027   01/12/16 2200  rifampin (RIFADIN) capsule 300 mg  Status:  Discontinued     300 mg Oral 2 times daily 01/12/16 1950 01/12/16 2026   01/12/16 2100  cefTRIAXone (ROCEPHIN) 2 g in dextrose 5 % 50 mL IVPB  Status:  Discontinued     2 g 100 mL/hr over 30 Minutes Intravenous Every 24 hours 01/12/16 1836 01/15/16 1108   01/12/16 2100  vancomycin (VANCOCIN) 2,000 mg in sodium chloride 0.9 % 500 mL IVPB     2,000 mg 250 mL/hr over 120 Minutes Intravenous  Once 01/12/16 2024 01/13/16 0105   01/12/16 1900  vancomycin (VANCOCIN) 2,000 mg in sodium chloride 0.9 % 500 mL IVPB  Status:  Discontinued     2,000 mg 250 mL/hr over 120 Minutes Intravenous To Post Anesthesia Care Unit 01/12/16 1850 01/12/16 2024   01/12/16 1656  vancomycin (VANCOCIN) powder  Status:  Discontinued       As needed 01/12/16 1716 01/12/16 1737   01/12/16 1400  ceFAZolin (ANCEF) 3 g in dextrose 5 % 50 mL IVPB     3 g 130 mL/hr over 30 Minutes Intravenous To ShortStay Surgical 01/11/16 0955 01/12/16 1615      Medications: Scheduled Meds: . alfuzosin  10 mg Oral Q breakfast  . aspirin EC  325 mg Oral Daily  . atorvastatin  40 mg Oral QPM  . calcium-vitamin D  1 tablet Oral BID  . carvedilol  12.5 mg Oral BID  WC  . docusate sodium  100 mg Oral BID  . finasteride  5 mg Oral Daily  . FLUoxetine  40 mg Oral Daily  . furosemide  20 mg Oral BID  . gabapentin  300 mg Oral BID  . insulin aspart  0-15 Units Subcutaneous TID WC  . insulin aspart  0-5 Units Subcutaneous QHS  . lisinopril  20 mg Oral QHS  . multivitamin with minerals  1 tablet Oral Daily  . pantoprazole  40 mg Oral BID  . potassium chloride SA  20 mEq Oral Daily  . vancomycin  1,250 mg Intravenous Q12H  . vitamin C  500 mg Oral Daily   Continuous Infusions: . sodium chloride 75 mL/hr at 01/18/16 1022     Objective: Weight change:   Intake/Output Summary (Last 24 hours) at 01/18/16 1255 Last data filed at 01/18/16 0619  Gross per 24 hour  Intake          1388.75 ml  Output             2200 ml  Net          -811.25 ml   Blood  pressure (!) 137/57, pulse 65, temperature 97.6 F (36.4 C), temperature source Oral, resp. rate 17, height 5' 10"  (1.778 m), weight 252 lb 3.2 oz (114.4 kg), SpO2 99 %. Temp:  [97.6 F (36.4 C)-98.3 F (36.8 C)] 97.6 F (36.4 C) (10/23 0619) Pulse Rate:  [57-65] 65 (10/23 1119) Resp:  [17-20] 17 (10/23 0619) BP: (125-151)/(50-57) 137/57 (10/23 1119) SpO2:  [96 %-99 %] 99 % (10/23 3151)  Physical Exam: General: Alert and awake, oriented x3, not in any acute distress. HEENT: anicteric sclera,  EOMI CVS regular rate, normal r,  no murmur rubs or gallops Chest: no wheezing, rales or rhonchi Abdomen: softnondistended, Extremities:knee with dressing of left knee, warm to touch in comparison to right knee. No erythema. Slight swelling, as expected Neuro: nonfocal  CBC:    CBC Latest Ref Rng & Units 01/16/2016 01/13/2016 01/08/2016  WBC 4.0 - 10.5 K/uL 4.8 7.0 6.5  Hemoglobin 13.0 - 17.0 g/dL 8.8(L) 9.8(L) 11.1(L)  Hematocrit 39.0 - 52.0 % 26.8(L) 30.3(L) 34.3(L)  Platelets 150 - 400 K/uL 137(L) 141(L) 174      BMET  Recent Labs  01/16/16 0438 01/18/16 0400  NA 137 139  K 3.9 3.9    CL 105 105  CO2 26 27  GLUCOSE 110* 108*  BUN 10 11  CREATININE 0.79 0.90  CALCIUM 9.2 9.2    Sedimentation Rate  Recent Labs  01/17/16 0553  ESRSEDRATE 63*   C-Reactive Protein  Recent Labs  01/16/16 0438 01/17/16 0553  CRP 4.7* 1.9*    Micro Results: Recent Results (from the past 720 hour(s))  Aerobic/Anaerobic Culture (surgical/deep wound)     Status: None (Preliminary result)   Collection Time: 01/12/16  4:33 PM  Result Value Ref Range Status   Specimen Description KNEE  Final   Special Requests WOUND LEFT  Final   Gram Stain   Final    RARE WBC PRESENT, PREDOMINANTLY PMN FEW GRAM POSITIVE COCCI IN PAIRS IN CLUSTERS    Culture   Final    NO GROWTH 2 DAYS NO ANAEROBES ISOLATED; CULTURE IN PROGRESS FOR 5 DAYS   Report Status PENDING  Incomplete    Assessment/Plan:  INTERVAL HISTORY: cultures still with NGTD for aerboes, anerobes pending  Principal Problem:   Prosthetic joint infection ([place]) Active Problems:   Infection of prosthetic left knee joint ([place])   Other abnormalities of gait and mobility    Steven Charles is a 70 y.o. 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 Staph ludgenensis, despite being a coagulase negative staph species can be as aggressive as staph aureus. Some isolate do contain the mecA gene that is responsible for methicillin resistance. Despite the fact that this gentleman's cultures grew Staph lugdunensis in May and June that were phenotypically S to [place]. His most recent cultures though had GPC on gram stain have failed to isolate the pathogen. It is reasonable to expand abtx coverange to include R oxacillin Staph with vancomycin   Diagnosis: Persistent infection prosthetic knee with removal of FB in knee cap  Culture Result: NO GROWTH  Allergies  Allergen Reactions  . Nicoderm [Nicotine] Other (See  Comments)    Heart rate dropped   . Adhesive [Tape] Itching and Rash    Please use "paper" tape    Discharge antibiotics: IV vancomycin Per pharmacy protocol vancomycin Aim for Vancomycin trough 15-20 (unless otherwise indicated) Duration:  6 weeks End Date: November 27th  [place] Per Protocol:  BI-WEEKLY LABS ON IV antibiotics:  _x_ BMP w GFR  Labs weekly while on IV antibiotics: _x_ CBC with differential _x_ CRP x__ ESR _x_ Vancomycin trough  _x_ Please pull PIC at completion of IV antibiotics __ Please leave PIC in place until doctor has seen patient or been notified  Fax weekly labs to [phone]  Clinic Follow Up Appt:  Next 4 weeks with dr. Lucianne Lei dam   Please call with further questions.      LOS: 6 days   Carlyle Basques 01/18/2016, 12:55 PM

## 2016-01-18 NOTE — Progress Notes (Signed)
Physical Therapy Treatment Patient Details Name: Steven Charles MRN: [id number] DOB: 10-02-1945 Today's Date: 01/18/2016    History of Present Illness 70 yo male who a few months ago underwent excision of all left total knee components due to a chronic infection. He has an antibiotic space in his knee.  Pt underwent removal of previous antibiotic spacer L knee, repeat I and D, synovectomy, L knee and placement of new antibiotic spacer with vancomycin on 10/17.    PT Comments    Patient c/o R hip pain with ambulation and maintained NWB L LE most of session. Continue to progress as tolerated with anticipated d/c home with HHPT.   Follow Up Recommendations  Home health PT;Supervision/Assistance - 24 hour     Equipment Recommendations  None recommended by PT    Recommendations for Other Services       Precautions / Restrictions Precautions Precautions: Fall Required Braces or Orthoses: Knee Immobilizer - Left Knee Immobilizer - Left: On at all times Restrictions Weight Bearing Restrictions: Yes LLE Weight Bearing: Touchdown weight bearing    Mobility  Bed Mobility               General bed mobility comments: not assessed; pt up in chair upon arrival  Transfers Overall transfer level: Needs assistance Equipment used: Rolling walker (2 wheeled) Transfers: Sit to/from Stand Sit to Stand: Min guard         General transfer comment: min guard for safety; increased time and effort   Ambulation/Gait Ambulation/Gait assistance: Min guard Ambulation Distance (Feet): 164 Feet Assistive device: Rolling walker (2 wheeled) Gait Pattern/deviations: Step-to pattern Gait velocity: decreased   General Gait Details: maintained NWB L LE most of session; several brief standing rest breaks with TDWB L LE; c/o R hip pain with increased gait distance   Stairs            Wheelchair Mobility    Modified Rankin (Stroke Patients Only)       Balance Overall balance  assessment: Needs assistance   Sitting balance-Leahy Scale: Good       Standing balance-Leahy Scale: Poor                      Cognition Arousal/Alertness: Awake/alert Behavior During Therapy: WFL for tasks assessed/performed Overall Cognitive Status: Within Functional Limits for tasks assessed                      Exercises      General Comments General comments (skin integrity, edema, etc.): discussed performing ROM/therex L LE       Pertinent Vitals/Pain Pain Assessment: Faces Faces Pain Scale: Hurts little more Pain Location: R hip with ambulation Pain Descriptors / Indicators: Aching;Grimacing;Guarding;Discomfort Pain Intervention(s): Limited activity within patient's tolerance;Monitored during session;Premedicated before session;Repositioned    Home Living                      Prior Function            PT Goals (current goals can now be found in the care plan section) Acute Rehab PT Goals Patient Stated Goal: go home Progress towards PT goals: Progressing toward goals    Frequency    Min 5X/week      PT Plan Current plan remains appropriate    Co-evaluation             End of Session Equipment Utilized During Treatment: Gait belt Activity Tolerance: Patient tolerated treatment  well Patient left: in chair;with call bell/phone within reach     Time: 1142-1207 PT Time Calculation (min) (ACUTE ONLY): 25 min  Charges:  $Gait Training: 8-22 mins $Therapeutic Activity: 8-22 mins                    G Codes:      [place] April, PTA Pager: [phone]   01/18/2016, 12:18 PM

## 2016-01-19 ENCOUNTER — Other Ambulatory Visit: Payer: Self-pay | Admitting: *Deleted

## 2016-01-19 ENCOUNTER — Other Ambulatory Visit: Payer: Self-pay

## 2016-01-19 DIAGNOSIS — Z7984 Long term (current) use of oral hypoglycemic drugs: Secondary | ICD-10-CM | POA: Diagnosis not present

## 2016-01-19 DIAGNOSIS — T8454XA Infection and inflammatory reaction due to internal left knee prosthesis, initial encounter: Secondary | ICD-10-CM | POA: Diagnosis not present

## 2016-01-19 DIAGNOSIS — T8454XD Infection and inflammatory reaction due to internal left knee prosthesis, subsequent encounter: Secondary | ICD-10-CM | POA: Diagnosis not present

## 2016-01-19 DIAGNOSIS — R2689 Other abnormalities of gait and mobility: Secondary | ICD-10-CM | POA: Diagnosis not present

## 2016-01-19 DIAGNOSIS — M6281 Muscle weakness (generalized): Secondary | ICD-10-CM | POA: Diagnosis not present

## 2016-01-19 DIAGNOSIS — I2510 Atherosclerotic heart disease of native coronary artery without angina pectoris: Secondary | ICD-10-CM | POA: Diagnosis not present

## 2016-01-19 LAB — AEROBIC/ANAEROBIC CULTURE W GRAM STAIN (SURGICAL/DEEP WOUND): Culture: NO GROWTH

## 2016-01-19 LAB — AEROBIC/ANAEROBIC CULTURE (SURGICAL/DEEP WOUND)

## 2016-01-19 LAB — GLUCOSE, CAPILLARY: Glucose-Capillary: 103 mg/dL — ABNORMAL HIGH (ref 65–99)

## 2016-01-19 MED ORDER — ASPIRIN 325 MG PO TBEC: 325.0000 mg | DELAYED_RELEASE_TABLET | Freq: Every day | ORAL | 0 refills | Status: DC

## 2016-01-19 MED ORDER — VANCOMYCIN HCL 10 G IV SOLR: 1250.0000 mg | Freq: Two times a day (BID) | INTRAVENOUS | 90 refills | Status: DC

## 2016-01-19 MED ORDER — OXYCODONE-ACETAMINOPHEN 5-325 MG PO TABS: 1.0000 | ORAL_TABLET | Freq: Four times a day (QID) | ORAL | 0 refills | Status: DC | PRN

## 2016-01-19 MED ORDER — HEPARIN SOD (PORK) LOCK FLUSH 100 UNIT/ML IV SOLN
250.0000 [IU] | INTRAVENOUS | Status: AC | PRN
  Administered 2016-01-19: 250 [IU]

## 2016-01-19 MED ORDER — VANCOMYCIN HCL 10 G IV SOLR
1250.0000 mg | Freq: Two times a day (BID) | INTRAVENOUS | Status: DC
  Administered 2016-01-19: 1250 mg via INTRAVENOUS
  Filled 2016-01-19 (×2): qty 1250

## 2016-01-19 MED ORDER — HEPARIN SOD (PORK) LOCK FLUSH 100 UNIT/ML IV SOLN
250.0000 [IU] | Freq: Once | INTRAVENOUS | Status: AC
  Administered 2016-01-19: 250 [IU] via INTRAVENOUS
  Filled 2016-01-19: qty 2.5

## 2016-01-19 MED ORDER — VANCOMYCIN HCL 10 G IV SOLR: 1250.0000 mg | Freq: Two times a day (BID) | INTRAVENOUS | Status: DC

## 2016-01-19 MED ORDER — METHOCARBAMOL 500 MG PO TABS: 500.0000 mg | ORAL_TABLET | Freq: Four times a day (QID) | ORAL | 0 refills | Status: DC | PRN

## 2016-01-19 NOTE — Progress Notes (Signed)
Discharge instructions given to patient and wife, both verbalized understanding, all belongings accounted for, and prescriptions given to patient.    Betha Loa Livie Vanderhoof, RN

## 2016-01-19 NOTE — Patient Outreach (Signed)
[place] [facility]) Care Management  01/19/2016  AIDYNN MARTELL [birth year] [id number]  I was notified by Mrs. Natividad Brood, [facility] Liaison of Mr. Astacio discharge to home.   Plan: I will reach out to Mr. Brendel by phone this week and begin transition of care services.    [facility]  [phone]

## 2016-01-19 NOTE — Care Management Note (Signed)
Case Management Note  Patient Details  Name: BRASON CHASON MRN: [id number] Date of Birth: 1945-10-15  Subjective/Objective:                 DC to home today, resuming HH with [facility] and IV abx. Referrals placed.   Action/Plan:   Expected Discharge Date:                  Expected Discharge Plan:  [place]  In-House Referral:  NA  Discharge planning Services  CM Consult  Post Acute Care Choice:  Home Health, Resumption of Svcs/PTA Provider, Durable Medical Equipment Choice offered to:  Patient  DME Arranged:  IV pump/equipment DME Agency:  [facility] Arranged:  RN, PT [facility] Agency:  [facility]  Status of Service:  Completed, signed off  If discussed at [place] of Stay Meetings, dates discussed:    Additional Comments:  Carles Collet, RN 01/19/2016, 9:48 AM
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
## 2016-01-19 NOTE — Consult Note (Signed)
Error for encounter

## 2016-01-19 NOTE — Progress Notes (Signed)
Patient ID: Steven Charles, male   DOB: [birth year], 70 y.o.   MRN: [id number] Doing well overall.  ID has recommended Vanc.  Stable left knee with no redness.  Sutures intact.  Can be discharged to home today.

## 2016-01-19 NOTE — Care Management Important Message (Signed)
Important Message  Patient Details  Name: Steven Charles MRN: [id number] Date of Birth: 1945-07-04   Medicare Important Message Given:  Yes    Allyne Hebert 01/19/2016, 1:34 PM

## 2016-01-20 ENCOUNTER — Other Ambulatory Visit: Payer: Self-pay | Admitting: *Deleted

## 2016-01-20 ENCOUNTER — Encounter: Payer: Self-pay | Admitting: *Deleted

## 2016-01-20 DIAGNOSIS — E119 Type 2 diabetes mellitus without complications: Secondary | ICD-10-CM | POA: Diagnosis not present

## 2016-01-20 DIAGNOSIS — Z5181 Encounter for therapeutic drug level monitoring: Secondary | ICD-10-CM | POA: Diagnosis not present

## 2016-01-20 DIAGNOSIS — I509 Heart failure, unspecified: Secondary | ICD-10-CM | POA: Diagnosis not present

## 2016-01-20 DIAGNOSIS — E785 Hyperlipidemia, unspecified: Secondary | ICD-10-CM | POA: Diagnosis not present

## 2016-01-20 DIAGNOSIS — Z452 Encounter for adjustment and management of vascular access device: Secondary | ICD-10-CM | POA: Diagnosis not present

## 2016-01-20 DIAGNOSIS — Z792 Long term (current) use of antibiotics: Secondary | ICD-10-CM | POA: Diagnosis not present

## 2016-01-20 DIAGNOSIS — Z7982 Long term (current) use of aspirin: Secondary | ICD-10-CM | POA: Diagnosis not present

## 2016-01-20 DIAGNOSIS — I110 Hypertensive heart disease with heart failure: Secondary | ICD-10-CM | POA: Diagnosis not present

## 2016-01-20 DIAGNOSIS — Z7984 Long term (current) use of oral hypoglycemic drugs: Secondary | ICD-10-CM | POA: Diagnosis not present

## 2016-01-20 DIAGNOSIS — Z7902 Long term (current) use of antithrombotics/antiplatelets: Secondary | ICD-10-CM | POA: Diagnosis not present

## 2016-01-20 DIAGNOSIS — T8454XA Infection and inflammatory reaction due to internal left knee prosthesis, initial encounter: Secondary | ICD-10-CM | POA: Diagnosis not present

## 2016-01-20 DIAGNOSIS — N400 Enlarged prostate without lower urinary tract symptoms: Secondary | ICD-10-CM | POA: Diagnosis not present

## 2016-01-20 NOTE — Patient Outreach (Signed)
[place]) Care Management  01/20/2016  Steven Charles Dec 14, 1945 [id number]   [name] is a 70 year old patient referred to [facility] after his recent hospital admission 08/28/15-08/31/15 for I&D and revision of the tibial component of infected left total knee arthroplasty. Steven Charles had placement of antibiotic beads, wound VAC, and a PICC line for 6 weeks IV antibiotics. He was newly diagnosed with DM Type II during this admission.  Steven Charles was discharged to home with home health services but had to return to the hospital for surgical revision and had a short term SNF stay for rehab. He has since been discharged to home and has been with home health physical therapy and nursing via [facility], receiving IV antibiotics and wound care.  Dr. Tommy Medal (infectious disease) referred Steven Charles back to Dr. Ninfa Linden who saw him in the office on 12/29/15. Steven Charles was subsequently scheduled for surgery on 01/12/16. Steven Charles was discharged to home yesterday with home health. I reached out today to perform transition of care assessment. Patient was recently discharged from hospital and all medications have been reviewed.  Steven Charles says he has had very little pain since discharge to home. His home health nurse came to visit last evening and is scheduled to come again on Tuesday.  He is taking IV antibiotics as prescribed and his wife is able to administer them as taught by the home health nurses over the last months.   Steven Charles is scheduled to see his providers as follows:  Dr. Ninfa Linden (surgery): 01/26/16 10:45am Dr. Drucilla Schmidt (infectious disease): 02/01/16 8:45am Dr. Stanford Breed (cardiology): 02/09/16 9:45am  Plan: Steven Charles will continue to work with home health through [facility] and will attend his scheduled provider appointments. Steven Charles will call his providers for new or worsened symptoms.   I will reach out to Steven Charles by phone next week.     [facility]  [phone]

## 2016-01-22 ENCOUNTER — Telehealth: Payer: Self-pay | Admitting: Cardiology

## 2016-01-22 DIAGNOSIS — T8454XA Infection and inflammatory reaction due to internal left knee prosthesis, initial encounter: Secondary | ICD-10-CM | POA: Diagnosis not present

## 2016-01-22 DIAGNOSIS — Z7984 Long term (current) use of oral hypoglycemic drugs: Secondary | ICD-10-CM | POA: Diagnosis not present

## 2016-01-22 NOTE — Telephone Encounter (Signed)
Left call communicating advice and to call if further needs.

## 2016-01-22 NOTE — Telephone Encounter (Signed)
New message  Calling to verify pt's EF

## 2016-01-25 ENCOUNTER — Encounter: Payer: Self-pay | Admitting: Infectious Disease

## 2016-01-25 DIAGNOSIS — Z7984 Long term (current) use of oral hypoglycemic drugs: Secondary | ICD-10-CM | POA: Diagnosis not present

## 2016-01-25 DIAGNOSIS — Z452 Encounter for adjustment and management of vascular access device: Secondary | ICD-10-CM | POA: Diagnosis not present

## 2016-01-25 DIAGNOSIS — T8454XA Infection and inflammatory reaction due to internal left knee prosthesis, initial encounter: Secondary | ICD-10-CM | POA: Diagnosis not present

## 2016-01-25 DIAGNOSIS — Z5181 Encounter for therapeutic drug level monitoring: Secondary | ICD-10-CM | POA: Diagnosis not present

## 2016-01-25 DIAGNOSIS — E119 Type 2 diabetes mellitus without complications: Secondary | ICD-10-CM | POA: Diagnosis not present

## 2016-01-26 ENCOUNTER — Ambulatory Visit (INDEPENDENT_AMBULATORY_CARE_PROVIDER_SITE_OTHER): Payer: Medicare Other | Admitting: Orthopaedic Surgery

## 2016-01-26 ENCOUNTER — Encounter (INDEPENDENT_AMBULATORY_CARE_PROVIDER_SITE_OTHER): Payer: Self-pay | Admitting: Orthopaedic Surgery

## 2016-01-26 DIAGNOSIS — Z9889 Other specified postprocedural states: Secondary | ICD-10-CM

## 2016-01-26 NOTE — Progress Notes (Signed)
Steven Charles is 2 weeks status post a repeat irrigation and debridement of his left knee and antibiotic spacer change. On examination his left knee incision looks much better overall the sutures are intact the redness and swelling have dissipated significantly. I do feel that we need to leave the sutures in for one more week ago so he can get them wet in the shower daily and keep his knee clean. We did talk about his surgery. He is on vancomycin at this point per the infectious disease clinic. He has wife understand that the definitive surgery for his left knee will likely be a knee fusion in the future. I would like to see him back next week for removal of the sutures and placement of Steri-Strips. No x-rays are needed at that visit. Once he completes his course of IV antibiotics we will repeat his sedimentation rate and CRP. At that point if the labs are trending in the right direction and we can set him up for his knee fusion.

## 2016-01-28 ENCOUNTER — Telehealth: Payer: Self-pay | Admitting: *Deleted

## 2016-01-28 ENCOUNTER — Encounter: Payer: Self-pay | Admitting: *Deleted

## 2016-01-28 ENCOUNTER — Other Ambulatory Visit: Payer: Self-pay | Admitting: *Deleted

## 2016-01-28 DIAGNOSIS — Z5181 Encounter for therapeutic drug level monitoring: Secondary | ICD-10-CM | POA: Diagnosis not present

## 2016-01-28 DIAGNOSIS — M6281 Muscle weakness (generalized): Secondary | ICD-10-CM | POA: Diagnosis not present

## 2016-01-28 DIAGNOSIS — T8454XD Infection and inflammatory reaction due to internal left knee prosthesis, subsequent encounter: Secondary | ICD-10-CM | POA: Diagnosis not present

## 2016-01-28 DIAGNOSIS — I2510 Atherosclerotic heart disease of native coronary artery without angina pectoris: Secondary | ICD-10-CM | POA: Diagnosis not present

## 2016-01-28 DIAGNOSIS — T8454XA Infection and inflammatory reaction due to internal left knee prosthesis, initial encounter: Secondary | ICD-10-CM | POA: Diagnosis not present

## 2016-01-28 DIAGNOSIS — Z7984 Long term (current) use of oral hypoglycemic drugs: Secondary | ICD-10-CM | POA: Diagnosis not present

## 2016-01-28 DIAGNOSIS — R2689 Other abnormalities of gait and mobility: Secondary | ICD-10-CM | POA: Diagnosis not present

## 2016-01-28 NOTE — Telephone Encounter (Signed)
Sarah from [facility] called to advise the patient is not able to have blood taken from his PICC. He is able to infuse but he is to have labs 2 x weekly and she has not been able to get labs this week. She advises he comes to clinic Monday 02/01/16 to see [facility] ans wants to know if the patient needs to go to the ED for cathflow as they are not able to do it in the home. She also wanted to let the doctor know the patient is non weight bearing on his left side. Advised will let the doctor know and call her back with the response.

## 2016-01-28 NOTE — Patient Outreach (Signed)
[facility]) Care Management  01/28/2016  [name] [birth year] [id number]  I spoke with Steven Charles today to follow up on transition of care and care coordination needs s/p his recent hospitalization for second revision of knee replacement.   Steven Charles says he is feeling well with the exception of a dry persistent cough he has had for a few days. He denies fever and says his cough is not productive.   Steven Charles says his incision looks good and is not red or swollen or painful. He continues to receive IV antibiotics and is expecting a visit from his nurse sometime today before 2pm.   Steven Charles is scheduled to follow up with Dr. Ninfa Linden to have his stitches out and at that time is going to discuss next steps with him. He says that he understands his choices at this point to be between "fusion" and seeing another doctor who might be able to do a mechanical knee replacement.   Plan: I will continue to outreach to Steven Charles weekly to follow up on transition of care and care coordination needs. Steven Charles will call for any new or worsened symptoms if he is unable to reach his provider.    [facility]  [phone]

## 2016-01-29 NOTE — Telephone Encounter (Signed)
I am moving my Monday clinic he likely needs cath-flow and or new picc this is one of problems with vanc

## 2016-01-31 ENCOUNTER — Other Ambulatory Visit (HOSPITAL_COMMUNITY)
Admission: RE | Admit: 2016-01-31 | Discharge: 2016-01-31 | Disposition: A | Discharge status: Home / Self Care | Payer: Medicare Other | Source: Skilled Nursing Facility | Attending: Infectious Disease | Admitting: Infectious Disease

## 2016-01-31 DIAGNOSIS — Z7984 Long term (current) use of oral hypoglycemic drugs: Secondary | ICD-10-CM | POA: Diagnosis not present

## 2016-01-31 DIAGNOSIS — Z5181 Encounter for therapeutic drug level monitoring: Secondary | ICD-10-CM | POA: Insufficient documentation

## 2016-01-31 DIAGNOSIS — T8454XA Infection and inflammatory reaction due to internal left knee prosthesis, initial encounter: Secondary | ICD-10-CM | POA: Diagnosis not present

## 2016-01-31 LAB — CBC
HCT: 30.5 % — ABNORMAL LOW (ref 39.0–52.0)
HEMOGLOBIN: 9.9 g/dL — AB (ref 13.0–17.0)
MCH: 28.2 pg (ref 26.0–34.0)
MCHC: 32.5 g/dL (ref 30.0–36.0)
MCV: 86.9 fL (ref 78.0–100.0)
PLATELETS: 217 10*3/uL (ref 150–400)
RBC: 3.51 MIL/uL — AB (ref 4.22–5.81)
RDW: 13.6 % (ref 11.5–15.5)
WBC: 7.3 10*3/uL (ref 4.0–10.5)

## 2016-01-31 LAB — BASIC METABOLIC PANEL
ANION GAP: 8 (ref 5–15)
BUN: 17 mg/dL (ref 6–20)
CALCIUM: 9.4 mg/dL (ref 8.9–10.3)
CHLORIDE: 104 mmol/L (ref 101–111)
CO2: 25 mmol/L (ref 22–32)
CREATININE: 1.09 mg/dL (ref 0.61–1.24)
GFR calc non Af Amer: >60 mL/min (ref >60)
Glucose, Bld: 103 mg/dL — ABNORMAL HIGH (ref 65–99)
Potassium: 3.9 mmol/L (ref 3.5–5.1)
SODIUM: 137 mmol/L (ref 135–145)

## 2016-01-31 LAB — VANCOMYCIN, TROUGH: Vancomycin Tr: 23 ug/mL (ref 15–20)

## 2016-01-31 LAB — SEDIMENTATION RATE: SED RATE: 70 mm/h — AB (ref 0–16)

## 2016-02-01 ENCOUNTER — Ambulatory Visit: Payer: Medicare Other | Admitting: Infectious Disease

## 2016-02-01 LAB — C-REACTIVE PROTEIN: CRP: 3.3 mg/dL — ABNORMAL HIGH (ref <1.0)

## 2016-02-02 ENCOUNTER — Telehealth: Payer: Self-pay

## 2016-02-02 ENCOUNTER — Ambulatory Visit (INDEPENDENT_AMBULATORY_CARE_PROVIDER_SITE_OTHER): Payer: Medicare Other | Admitting: Orthopaedic Surgery

## 2016-02-02 DIAGNOSIS — Z8739 Personal history of other diseases of the musculoskeletal system and connective tissue: Secondary | ICD-10-CM

## 2016-02-02 NOTE — Telephone Encounter (Signed)
Patient is at Dr Pryor Montes office stating his PICC is not accepting medications or blood withdrawals.   I have asked the patient to call our office.  He will need to go to the Emergency Room since home health does not maintain his PICC .  We do not have cath flow in our office.   Laverle Patter, RN

## 2016-02-02 NOTE — Telephone Encounter (Signed)
Patients wife walked into clinic.  She states the home health agency is not able to withdrawn labs from PICC . I informed patient's wife the home health agency has standing orders for cath flow.  I will contact the agency.  I spoke with [facility] and patient is Medicare part B which may not pay for the cath flow. If not patient will have other options for payment.    Laverle Patter, RN

## 2016-02-02 NOTE — Progress Notes (Signed)
Steven Charles is here today for suture removal from his left knee. He is status post a repeat irrigation and debridement as well as placement of any new antibiotic spacer. He is doing well overall but his PICC line has become clogged and is not accessible.  Examination of his left knee shows no redness today at all. There is swelling to be expected but he tolerates trying to move his knee. We removed all the sutures and placed Steri-Strips.  At this point he'll continue his course of IV antibiotics per the infectious disease specialists. I'll see him back in 4 weeks to see how is doing overall at some point will need to draw Labs to assess his markers for inflammation and infection.

## 2016-02-04 ENCOUNTER — Other Ambulatory Visit: Payer: Self-pay | Admitting: *Deleted

## 2016-02-04 ENCOUNTER — Other Ambulatory Visit (HOSPITAL_COMMUNITY)
Admission: AD | Admit: 2016-02-04 | Discharge: 2016-02-04 | Disposition: A | Discharge status: Home / Self Care | Payer: Medicare Other | Source: Skilled Nursing Facility | Attending: Surgery | Admitting: Surgery

## 2016-02-04 DIAGNOSIS — T8454XA Infection and inflammatory reaction due to internal left knee prosthesis, initial encounter: Secondary | ICD-10-CM | POA: Insufficient documentation

## 2016-02-04 DIAGNOSIS — Z7984 Long term (current) use of oral hypoglycemic drugs: Secondary | ICD-10-CM | POA: Diagnosis not present

## 2016-02-04 LAB — VANCOMYCIN, TROUGH: Vancomycin Tr: 13 ug/mL — ABNORMAL LOW (ref 15–20)
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
## 2016-02-04 NOTE — Patient Outreach (Signed)
[facility]) Care Management  02/04/2016  Steven Charles 1945/12/16 [id number]  Follow up with Steven Charles re: Steven PICC line and IV Antibiotics:   [facility] Pharmacist Isabell Jarvis PharmD is collaborating with our assistant clinical director of pharmacy service and the payor pharmacy specialist regarding approval for cath flow at home for Steven Charles.   I spoke with Steven Charles this afternoon and she reports no difficulty using PICC line for delivery of IV Vancomycin. However, the home health nurse is unable to use the line for blood draws. She performed venipuncture today.   Plan: We will follow up with Steven Charles [phone] and Steven Charles [phone] in addition to Steven Charles and Steven Charles when we have more information.    [facility]  [phone]

## 2016-02-05 ENCOUNTER — Other Ambulatory Visit: Payer: Self-pay | Admitting: *Deleted

## 2016-02-05 DIAGNOSIS — R2689 Other abnormalities of gait and mobility: Secondary | ICD-10-CM | POA: Diagnosis not present

## 2016-02-05 DIAGNOSIS — T8454XD Infection and inflammatory reaction due to internal left knee prosthesis, subsequent encounter: Secondary | ICD-10-CM | POA: Diagnosis not present

## 2016-02-05 DIAGNOSIS — M6281 Muscle weakness (generalized): Secondary | ICD-10-CM | POA: Diagnosis not present

## 2016-02-05 DIAGNOSIS — I2510 Atherosclerotic heart disease of native coronary artery without angina pectoris: Secondary | ICD-10-CM | POA: Diagnosis not present

## 2016-02-05 NOTE — Patient Outreach (Signed)
[facility]) Care Management  02/05/2016  Steven Charles Mar 20, 1946 [id number]  I reached out to Steven Charles today to follow up on his PICC line functioning. He tells me the line is receiving his IV antibiotics without difficulty. His nurse is still unable to perform blood draws from the PICC line and is performing twice weekly venipuncture for required labs. Our pharmacy team is working with Steven Charles payor and provider teams to try and facilitate coverage for administration of cath flow at home by home health.   Steven Charles tells me today that his pain is well managed and he is resting fairly well. His home health nurse continues to visit weekly as outlined above. He has transportation to all his scheduled provider appointments and is aware of dates/times.   Plan: I will follow up with the pharmacy team and Steven Charles next week.    [facility]  [phone]

## 2016-02-06 DIAGNOSIS — I2510 Atherosclerotic heart disease of native coronary artery without angina pectoris: Secondary | ICD-10-CM | POA: Diagnosis not present

## 2016-02-06 DIAGNOSIS — T8454XD Infection and inflammatory reaction due to internal left knee prosthesis, subsequent encounter: Secondary | ICD-10-CM | POA: Diagnosis not present

## 2016-02-06 DIAGNOSIS — M6281 Muscle weakness (generalized): Secondary | ICD-10-CM | POA: Diagnosis not present

## 2016-02-06 DIAGNOSIS — R2689 Other abnormalities of gait and mobility: Secondary | ICD-10-CM | POA: Diagnosis not present

## 2016-02-08 ENCOUNTER — Encounter: Payer: Self-pay | Admitting: Infectious Disease

## 2016-02-08 ENCOUNTER — Ambulatory Visit (INDEPENDENT_AMBULATORY_CARE_PROVIDER_SITE_OTHER): Payer: Medicare Other | Admitting: Infectious Disease

## 2016-02-08 VITALS — BP 85/46 | HR 66 | Temp 99.2°F

## 2016-02-08 DIAGNOSIS — Z5181 Encounter for therapeutic drug level monitoring: Secondary | ICD-10-CM | POA: Diagnosis not present

## 2016-02-08 DIAGNOSIS — N179 Acute kidney failure, unspecified: Secondary | ICD-10-CM

## 2016-02-08 DIAGNOSIS — R42 Dizziness and giddiness: Secondary | ICD-10-CM

## 2016-02-08 DIAGNOSIS — T8454XS Infection and inflammatory reaction due to internal left knee prosthesis, sequela: Secondary | ICD-10-CM | POA: Diagnosis not present

## 2016-02-08 DIAGNOSIS — B957 Other staphylococcus as the cause of diseases classified elsewhere: Secondary | ICD-10-CM

## 2016-02-08 DIAGNOSIS — I959 Hypotension, unspecified: Secondary | ICD-10-CM

## 2016-02-08 DIAGNOSIS — T8450XS Infection and inflammatory reaction due to unspecified internal joint prosthesis, sequela: Secondary | ICD-10-CM

## 2016-02-08 DIAGNOSIS — L304 Erythema intertrigo: Secondary | ICD-10-CM

## 2016-02-08 DIAGNOSIS — Z7984 Long term (current) use of oral hypoglycemic drugs: Secondary | ICD-10-CM | POA: Diagnosis not present

## 2016-02-08 DIAGNOSIS — T8454XA Infection and inflammatory reaction due to internal left knee prosthesis, initial encounter: Secondary | ICD-10-CM | POA: Diagnosis not present

## 2016-02-08 DIAGNOSIS — E1100 Type 2 diabetes mellitus with hyperosmolarity without nonketotic hyperglycemic-hyperosmolar coma (NKHHC): Secondary | ICD-10-CM

## 2016-02-08 HISTORY — DX: Erythema intertrigo: L30.4

## 2016-02-08 HISTORY — DX: Dizziness and giddiness: R42

## 2016-02-08 MED ORDER — FLUCONAZOLE 100 MG PO TABS: 100.0000 mg | ORAL_TABLET | Freq: Every day | ORAL | 3 refills | Status: DC
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
## 2016-02-09 ENCOUNTER — Other Ambulatory Visit: Payer: Self-pay | Admitting: Internal Medicine

## 2016-02-09 ENCOUNTER — Ambulatory Visit (INDEPENDENT_AMBULATORY_CARE_PROVIDER_SITE_OTHER): Payer: Medicare Other | Admitting: Cardiology

## 2016-02-09 ENCOUNTER — Encounter: Payer: Self-pay | Admitting: Cardiology

## 2016-02-09 VITALS — BP 105/58 | HR 71 | Ht 70.0 in | Wt 242.2 lb

## 2016-02-09 DIAGNOSIS — I679 Cerebrovascular disease, unspecified: Secondary | ICD-10-CM

## 2016-02-09 DIAGNOSIS — I2510 Atherosclerotic heart disease of native coronary artery without angina pectoris: Secondary | ICD-10-CM

## 2016-02-09 DIAGNOSIS — I10 Essential (primary) hypertension: Secondary | ICD-10-CM

## 2016-02-09 DIAGNOSIS — E7800 Pure hypercholesterolemia, unspecified: Secondary | ICD-10-CM | POA: Diagnosis not present

## 2016-02-09 NOTE — Patient Instructions (Signed)
Medication Instructions:  Continue current medications  Labwork: None Ordered  Testing/Procedures: Your physician has requested that you have a carotid duplex. This test is an ultrasound of the carotid arteries in your neck. It looks at blood flow through these arteries that supply the brain with blood. Allow one hour for this exam. There are no restrictions or special instructions.  Follow-Up: Your physician wants you to follow-up in: 1 Year. You will receive a reminder letter in the mail two months in advance. If you don't receive a letter, please call our office to schedule the follow-up appointment.   Any Other Special Instructions Will Be Listed Below (If Applicable).          Happy Thanksgiving   If you need a refill on your cardiac medications before your next appointment, please call your pharmacy.

## 2016-02-10 ENCOUNTER — Other Ambulatory Visit: Payer: Self-pay | Admitting: Infectious Disease

## 2016-02-10 ENCOUNTER — Telehealth: Payer: Self-pay | Admitting: Infectious Disease

## 2016-02-10 DIAGNOSIS — M00062 Staphylococcal arthritis, left knee: Secondary | ICD-10-CM

## 2016-02-10 NOTE — Telephone Encounter (Signed)
Excellent

## 2016-02-10 NOTE — Telephone Encounter (Signed)
Spoke with [place], they will schedule patient.  Per insurance, no prior authorization required.  Patient may schedule.  Reference:[id number] Landis Gandy, RN

## 2016-02-10 NOTE — Telephone Encounter (Signed)
/  w Dr Ninfa Linden  We would like tp get MRI knee left w contrast eval for osteo I wrote stat order

## 2016-02-11 ENCOUNTER — Other Ambulatory Visit: Payer: Self-pay | Admitting: *Deleted

## 2016-02-11 ENCOUNTER — Ambulatory Visit (HOSPITAL_COMMUNITY)
Admission: RE | Admit: 2016-02-11 | Discharge: 2016-02-11 | Disposition: A | Discharge status: Home / Self Care | Payer: Medicare Other | Source: Ambulatory Visit | Attending: Infectious Disease | Admitting: Infectious Disease

## 2016-02-11 DIAGNOSIS — R600 Localized edema: Secondary | ICD-10-CM | POA: Diagnosis not present

## 2016-02-11 DIAGNOSIS — M659 Synovitis and tenosynovitis, unspecified: Secondary | ICD-10-CM | POA: Diagnosis not present

## 2016-02-11 DIAGNOSIS — Z5181 Encounter for therapeutic drug level monitoring: Secondary | ICD-10-CM | POA: Diagnosis not present

## 2016-02-11 DIAGNOSIS — R601 Generalized edema: Secondary | ICD-10-CM | POA: Diagnosis not present

## 2016-02-11 DIAGNOSIS — T8454XA Infection and inflammatory reaction due to internal left knee prosthesis, initial encounter: Secondary | ICD-10-CM | POA: Diagnosis not present

## 2016-02-11 DIAGNOSIS — Z7984 Long term (current) use of oral hypoglycemic drugs: Secondary | ICD-10-CM | POA: Diagnosis not present

## 2016-02-11 DIAGNOSIS — M00062 Staphylococcal arthritis, left knee: Secondary | ICD-10-CM | POA: Insufficient documentation

## 2016-02-11 DIAGNOSIS — M25462 Effusion, left knee: Secondary | ICD-10-CM | POA: Insufficient documentation

## 2016-02-11 MED ORDER — GADOBENATE DIMEGLUMINE 529 MG/ML IV SOLN
20.0000 mL | Freq: Once | INTRAVENOUS | Status: AC | PRN
  Administered 2016-02-11: 20 mL via INTRAVENOUS

## 2016-02-11 NOTE — Patient Outreach (Signed)
[facility]) Care Management  02/11/2016  Steven Charles November 14, 1945 [id number]  I spoke with Steven Charles today who reports he feels he is doing "fairly well". We discussed use of his PICC line for administration of IV Abx and blood draws for labs. He says his [facility] is still unable to draw blood from the line but there are not difficulties with medication administration. He plans to continue use of the line for IV Abx and will have the [facility] continue venipuncture for labs. Having discussed this with his providers, he anticipates getting a new PICC line at the time of his next procedure. He has an appointment with ID on Monday 06/15/15.   Plan: I will follow up with Steven Charles by phone next week.    [facility]  [phone]

## 2016-02-11 NOTE — Telephone Encounter (Signed)
Perfect

## 2016-02-11 NOTE — Telephone Encounter (Signed)
Patient scheduled for MRI at [facility], 9pm 11/16.

## 2016-02-13 DIAGNOSIS — R2689 Other abnormalities of gait and mobility: Secondary | ICD-10-CM | POA: Diagnosis not present

## 2016-02-13 DIAGNOSIS — T8454XD Infection and inflammatory reaction due to internal left knee prosthesis, subsequent encounter: Secondary | ICD-10-CM | POA: Diagnosis not present

## 2016-02-13 DIAGNOSIS — I2510 Atherosclerotic heart disease of native coronary artery without angina pectoris: Secondary | ICD-10-CM | POA: Diagnosis not present

## 2016-02-13 DIAGNOSIS — M6281 Muscle weakness (generalized): Secondary | ICD-10-CM | POA: Diagnosis not present

## 2016-02-14 ENCOUNTER — Other Ambulatory Visit (HOSPITAL_COMMUNITY)
Admission: RE | Admit: 2016-02-14 | Discharge: 2016-02-14 | Disposition: A | Discharge status: Home / Self Care | Payer: Medicare Other | Source: Other Acute Inpatient Hospital | Attending: Infectious Disease | Admitting: Infectious Disease

## 2016-02-14 DIAGNOSIS — Z7984 Long term (current) use of oral hypoglycemic drugs: Secondary | ICD-10-CM | POA: Diagnosis not present

## 2016-02-14 DIAGNOSIS — T8454XA Infection and inflammatory reaction due to internal left knee prosthesis, initial encounter: Secondary | ICD-10-CM | POA: Diagnosis not present

## 2016-02-14 DIAGNOSIS — Z5181 Encounter for therapeutic drug level monitoring: Secondary | ICD-10-CM | POA: Insufficient documentation

## 2016-02-14 LAB — BASIC METABOLIC PANEL
Anion gap: 7 (ref 5–15)
BUN: 18 mg/dL (ref 6–20)
CO2: 23 mmol/L (ref 22–32)
CREATININE: 1.07 mg/dL (ref 0.61–1.24)
Calcium: 9.6 mg/dL (ref 8.9–10.3)
Chloride: 108 mmol/L (ref 101–111)
GFR calc Af Amer: >60 mL/min (ref >60)
Glucose, Bld: 121 mg/dL — ABNORMAL HIGH (ref 65–99)
Potassium: 3.9 mmol/L (ref 3.5–5.1)
SODIUM: 138 mmol/L (ref 135–145)

## 2016-02-14 LAB — CBC WITH DIFFERENTIAL/PLATELET
BASOS ABS: 0 10*3/uL (ref 0.0–0.1)
BASOS PCT: 0 %
EOS ABS: 0.4 10*3/uL (ref 0.0–0.7)
EOS PCT: 7 %
HCT: 30 % — ABNORMAL LOW (ref 39.0–52.0)
Hemoglobin: 9.7 g/dL — ABNORMAL LOW (ref 13.0–17.0)
Lymphocytes Relative: 19 %
Lymphs Abs: 1 10*3/uL (ref 0.7–4.0)
MCH: 27.8 pg (ref 26.0–34.0)
MCHC: 32.3 g/dL (ref 30.0–36.0)
MCV: 86 fL (ref 78.0–100.0)
MONO ABS: 0.6 10*3/uL (ref 0.1–1.0)
Monocytes Relative: 11 %
Neutro Abs: 3.4 10*3/uL (ref 1.7–7.7)
Neutrophils Relative %: 63 %
PLATELETS: 162 10*3/uL (ref 150–400)
RBC: 3.49 MIL/uL — ABNORMAL LOW (ref 4.22–5.81)
RDW: 13.7 % (ref 11.5–15.5)
WBC: 5.4 10*3/uL (ref 4.0–10.5)

## 2016-02-14 LAB — VANCOMYCIN, TROUGH: VANCOMYCIN TR: 12 ug/mL — AB (ref 15–20)

## 2016-02-14 LAB — SEDIMENTATION RATE: Sed Rate: 47 mm/hr — ABNORMAL HIGH (ref 0–16)

## 2016-02-15 ENCOUNTER — Ambulatory Visit (INDEPENDENT_AMBULATORY_CARE_PROVIDER_SITE_OTHER): Payer: Medicare Other | Admitting: Infectious Disease

## 2016-02-15 ENCOUNTER — Encounter: Payer: Self-pay | Admitting: Infectious Disease

## 2016-02-15 ENCOUNTER — Telehealth (INDEPENDENT_AMBULATORY_CARE_PROVIDER_SITE_OTHER): Payer: Self-pay | Admitting: Orthopaedic Surgery

## 2016-02-15 VITALS — BP 119/67 | HR 64 | Temp 97.9°F | Ht 70.0 in | Wt 243.0 lb

## 2016-02-15 DIAGNOSIS — B957 Other staphylococcus as the cause of diseases classified elsewhere: Secondary | ICD-10-CM | POA: Diagnosis not present

## 2016-02-15 DIAGNOSIS — T8454XS Infection and inflammatory reaction due to internal left knee prosthesis, sequela: Secondary | ICD-10-CM | POA: Diagnosis not present

## 2016-02-15 DIAGNOSIS — T8450XS Infection and inflammatory reaction due to unspecified internal joint prosthesis, sequela: Secondary | ICD-10-CM | POA: Diagnosis not present

## 2016-02-15 DIAGNOSIS — M86152 Other acute osteomyelitis, left femur: Secondary | ICD-10-CM

## 2016-02-15 DIAGNOSIS — M869 Osteomyelitis, unspecified: Secondary | ICD-10-CM

## 2016-02-15 HISTORY — DX: Osteomyelitis, unspecified: M86.9

## 2016-02-15 LAB — C-REACTIVE PROTEIN: CRP: 1.1 mg/dL — ABNORMAL HIGH (ref <1.0)

## 2016-02-15 NOTE — Telephone Encounter (Signed)
I spoke with Steven Charles.  I absolutely need to see him next Monday or Tuesday.

## 2016-02-15 NOTE — Telephone Encounter (Signed)
See below
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
## 2016-02-15 NOTE — Patient Instructions (Addendum)
Let us know when your are having surgery -

## 2016-02-16 ENCOUNTER — Telehealth: Payer: Self-pay

## 2016-02-16 ENCOUNTER — Other Ambulatory Visit: Payer: Medicare Other

## 2016-02-16 NOTE — Telephone Encounter (Signed)
Note he should keep the PICC line until he has surgery history also stay on antibiotics until he has surgery he is going to need an amputation above the knee

## 2016-02-16 NOTE — Telephone Encounter (Signed)
Patient scheduled for 11/27.

## 2016-02-16 NOTE — Telephone Encounter (Signed)
Is the plan to pull the PICC on 11-27 as scheduled?   [place] [phone]

## 2016-02-17 ENCOUNTER — Ambulatory Visit (INDEPENDENT_AMBULATORY_CARE_PROVIDER_SITE_OTHER): Payer: Medicare Other | Admitting: Internal Medicine

## 2016-02-17 ENCOUNTER — Other Ambulatory Visit: Payer: Self-pay | Admitting: *Deleted

## 2016-02-17 ENCOUNTER — Encounter: Payer: Self-pay | Admitting: Internal Medicine

## 2016-02-17 VITALS — BP 130/70 | HR 69 | Temp 98.2°F | Resp 20 | Wt 244.0 lb

## 2016-02-17 DIAGNOSIS — E1100 Type 2 diabetes mellitus with hyperosmolarity without nonketotic hyperglycemic-hyperosmolar coma (NKHHC): Secondary | ICD-10-CM

## 2016-02-17 DIAGNOSIS — R21 Rash and other nonspecific skin eruption: Secondary | ICD-10-CM

## 2016-02-17 DIAGNOSIS — Z5181 Encounter for therapeutic drug level monitoring: Secondary | ICD-10-CM | POA: Diagnosis not present

## 2016-02-17 DIAGNOSIS — Z0001 Encounter for general adult medical examination with abnormal findings: Secondary | ICD-10-CM

## 2016-02-17 DIAGNOSIS — T8454XA Infection and inflammatory reaction due to internal left knee prosthesis, initial encounter: Secondary | ICD-10-CM | POA: Diagnosis not present

## 2016-02-17 DIAGNOSIS — E7800 Pure hypercholesterolemia, unspecified: Secondary | ICD-10-CM

## 2016-02-17 DIAGNOSIS — Z7984 Long term (current) use of oral hypoglycemic drugs: Secondary | ICD-10-CM | POA: Diagnosis not present

## 2016-02-17 DIAGNOSIS — I10 Essential (primary) hypertension: Secondary | ICD-10-CM

## 2016-02-17 MED ORDER — NYSTATIN 100000 UNIT/GM EX POWD: CUTANEOUS | 1 refills | Status: DC

## 2016-02-17 NOTE — Patient Instructions (Addendum)
Please take all new medication as prescribed  - the powder  Please continue all other medications as before, and refills have been done if requested.  Please have the pharmacy call with any other refills you may need.  Please keep your appointments with your specialists as you may have planned  No need for further labs today  Please return in 6 months, or sooner if needed, with Lab testing done 3-5 days before

## 2016-02-17 NOTE — Telephone Encounter (Signed)
Spoke with [facility] Nurse . Informed her of information received from Dr. Tommy Medal.   Laverle Patter, RN

## 2016-02-17 NOTE — Patient Outreach (Signed)
[facility]) Care Management  02/17/2016  CORDAI LITTERER 11-26-1945 [id number]  Follow up with Mr. Liquori re: his appointment with ID. Dr. 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 Veronica is scheduled to see Dr. Ninfa Linden on Monday 02/22/16.   He continues to receive [facility] visits at least twice weekly. He is receiving daily antibiotics. Although blood draws can no longer be performed via his PICC line, it is receiving IV medications including antibiotics without difficulty.   Plan: I will follow up with Mr. Mcclendon next week.    [place] [facility]  [phone]

## 2016-02-17 NOTE — Progress Notes (Signed)
Pre visit review using our clinic review tool, if applicable. No additional management support is needed unless otherwise documented below in the visit note. 
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
## 2016-02-18 ENCOUNTER — Ambulatory Visit: Payer: Self-pay | Admitting: *Deleted

## 2016-02-18 NOTE — Assessment & Plan Note (Signed)
stable overall by history and exam, recent data reviewed with pt, and pt to continue medical treatment as before,  to f/u any worsening symptoms or concerns BP Readings from Last 3 Encounters:  02/17/16 130/70  02/15/16 119/67  02/09/16 (!) 105/58

## 2016-02-18 NOTE — Assessment & Plan Note (Signed)
C/w intertrigo, for candida tx with topical nystatin,  to f/u any worsening symptoms or concerns

## 2016-02-18 NOTE — Assessment & Plan Note (Signed)
stable overall by history and exam, recent data reviewed with pt, and pt to continue medical treatment as before,  to f/u any worsening symptoms or concerns Lab Results  Component Value Date   LDLCALC 66 10/02/2015

## 2016-02-18 NOTE — Assessment & Plan Note (Signed)
stable overall by history and exam, recent data reviewed with pt, and pt to continue medical treatment as before,  to f/u any worsening symptoms or concerns Lab Results  Component Value Date   HGBA1C 5.8 (H) 01/08/2016

## 2016-02-20 DIAGNOSIS — R2689 Other abnormalities of gait and mobility: Secondary | ICD-10-CM | POA: Diagnosis not present

## 2016-02-20 DIAGNOSIS — I2510 Atherosclerotic heart disease of native coronary artery without angina pectoris: Secondary | ICD-10-CM | POA: Diagnosis not present

## 2016-02-20 DIAGNOSIS — M6281 Muscle weakness (generalized): Secondary | ICD-10-CM | POA: Diagnosis not present

## 2016-02-20 DIAGNOSIS — T8454XD Infection and inflammatory reaction due to internal left knee prosthesis, subsequent encounter: Secondary | ICD-10-CM | POA: Diagnosis not present

## 2016-02-21 ENCOUNTER — Other Ambulatory Visit (HOSPITAL_COMMUNITY)
Admission: RE | Admit: 2016-02-21 | Discharge: 2016-02-21 | Disposition: A | Discharge status: Home / Self Care | Payer: Medicare Other | Source: Other Acute Inpatient Hospital | Attending: Infectious Disease | Admitting: Infectious Disease

## 2016-02-21 DIAGNOSIS — Z5181 Encounter for therapeutic drug level monitoring: Secondary | ICD-10-CM | POA: Diagnosis not present

## 2016-02-21 DIAGNOSIS — Z7984 Long term (current) use of oral hypoglycemic drugs: Secondary | ICD-10-CM | POA: Diagnosis not present

## 2016-02-21 DIAGNOSIS — T8454XA Infection and inflammatory reaction due to internal left knee prosthesis, initial encounter: Secondary | ICD-10-CM | POA: Diagnosis not present

## 2016-02-21 LAB — BASIC METABOLIC PANEL
ANION GAP: 7 (ref 5–15)
BUN: 18 mg/dL (ref 6–20)
CALCIUM: 9.8 mg/dL (ref 8.9–10.3)
CO2: 24 mmol/L (ref 22–32)
CREATININE: 1.09 mg/dL (ref 0.61–1.24)
Chloride: 104 mmol/L (ref 101–111)
GFR calc Af Amer: >60 mL/min (ref >60)
GLUCOSE: 140 mg/dL — AB (ref 65–99)
Potassium: 4 mmol/L (ref 3.5–5.1)
Sodium: 135 mmol/L (ref 135–145)

## 2016-02-21 LAB — CBC
HEMATOCRIT: 30.5 % — AB (ref 39.0–52.0)
HEMOGLOBIN: 9.8 g/dL — AB (ref 13.0–17.0)
MCH: 27.9 pg (ref 26.0–34.0)
MCHC: 32.1 g/dL (ref 30.0–36.0)
MCV: 86.9 fL (ref 78.0–100.0)
Platelets: 143 10*3/uL — ABNORMAL LOW (ref 150–400)
RBC: 3.51 MIL/uL — ABNORMAL LOW (ref 4.22–5.81)
RDW: 13.9 % (ref 11.5–15.5)
WBC: 4.9 10*3/uL (ref 4.0–10.5)

## 2016-02-21 LAB — VANCOMYCIN, TROUGH: Vancomycin Tr: 12 ug/mL — ABNORMAL LOW (ref 15–20)

## 2016-02-21 LAB — C-REACTIVE PROTEIN: CRP: 1.2 mg/dL — ABNORMAL HIGH (ref <1.0)

## 2016-02-21 LAB — SEDIMENTATION RATE: SED RATE: 65 mm/h — AB (ref 0–16)

## 2016-02-22 ENCOUNTER — Ambulatory Visit (INDEPENDENT_AMBULATORY_CARE_PROVIDER_SITE_OTHER): Payer: Medicare Other | Admitting: Orthopaedic Surgery

## 2016-02-22 DIAGNOSIS — T8454XS Infection and inflammatory reaction due to internal left knee prosthesis, sequela: Secondary | ICD-10-CM

## 2016-02-22 NOTE — Progress Notes (Signed)
Steven Charles is here for review of MRI of his left knee today. He is very talked with infectious disease specialist and understands he has asked to osteomyelitis and both his distal femur and his proximal tibia. At this point he understands there is not really good treatment for this other than considering an above-knee and dictation. He has had multiple surgeries on this knee including 2 thorough irrigation and debridements with anabolic spacer changes in all the hardware is gone from his knee now. His infectious parameter labs are all staying elevated as well. On examination of the knee there is still slight redness around the incision is well and pain. I counseled him about the surgery in detail as well as a thorough discussion of the risk and benefits of this. I gave him a note to go by [facility] for counseling preoperative is well. I would likely have him the hospital for at least 2-3 days postoperative for therapy and watching his health closely in general. We had a long and detailed discussion about this whole process and what is going on with his knee for long period of time. We will set the surgery up for sometime in the next 1-2 weeks.

## 2016-02-23 ENCOUNTER — Other Ambulatory Visit: Payer: Self-pay | Admitting: *Deleted

## 2016-02-23 NOTE — Patient Outreach (Signed)
[facility]) Care Management  02/23/2016  Steven Charles 22-Feb-1946 [id number]  I reached out to Steven Charles this afternoon to follow up on his appointment with Dr. Ninfa Linden yesterday. Steven Charles gave me permission to speak with his wife. Steven Charles and I spoke at length and she said that she and Steven Charles were disappointed that Steven Charles is going to have to have an amputation but knew they "had to do what they had to do".   Steven Charles confirmed that she and Steven Charles have an appointment with Steven Charles at [facility] tomorrow at 1:30pm for counseling.   Steven Charles is trying to make plans for care for her adult son who has a TBI and for whom Steven Charles is primary caregiver. She is also interested in longer term day programs that will allow her son to enjoy socialization in a supervised setting. I made a referral to Steven Charles, Director at the [facility] in [place] and provided [place] with Steven Charles's contact information per her request.   In my absence next week, [name] knows how to get in touch with Steven Charles [facility], my coverage partner, while I will be out of town for the week.   Plan: I will follow up with [name] and his wife by phone upon my return.   [place] Management  [phone]

## 2016-02-25 DIAGNOSIS — Z7984 Long term (current) use of oral hypoglycemic drugs: Secondary | ICD-10-CM | POA: Diagnosis not present

## 2016-02-25 DIAGNOSIS — T8454XA Infection and inflammatory reaction due to internal left knee prosthesis, initial encounter: Secondary | ICD-10-CM | POA: Diagnosis not present

## 2016-02-25 DIAGNOSIS — Z5181 Encounter for therapeutic drug level monitoring: Secondary | ICD-10-CM | POA: Diagnosis not present

## 2016-02-26 NOTE — Progress Notes (Signed)
Surgery on 03/03/16.  Preop on 02/29/16.  Need orders in EPIc Thank You

## 2016-02-26 NOTE — Progress Notes (Signed)
CBC HEMAGLOBIN 9.6 02-21-16 EPIC BMET 02-21-16 EPIC CHEST XRAY 02-08-16 EPIC EKG 08-22-15 EPIC

## 2016-02-26 NOTE — Patient Instructions (Addendum)
Steven Charles  02/26/2016   Your procedure is scheduled on: 02-26-16  Report to [facility]  Entrance take [facility]  elevators to 3rd floor to  [place] at 800  AM.  Call this number if you have problems the morning of surgery [phone]   Remember: ONLY 1 PERSON MAY GO WITH YOU TO SHORT STAY TO GET  READY MORNING OF [place].  Do not eat food or drink liquids :After Midnight.     Take these medicines the morning of surgery with A SIP OF WATER: FINASTERIDE (PROSCAR), FLUOXETINE (PROZAC), GABAPENTIN (NEURONTIN), HYDROCODNE IF NEEDED, LORAZEPAM (ATIVAN) IF NEEDED, carvidilol (coreg)              You may not have any metal on your body including hair pins and              piercings  Do not wear jewelry, make-up, lotions, powders or perfumes, deodorant             Do not wear nail polish.  Do not shave  48 hours prior to surgery.              Men may shave face and neck.   Do not bring valuables to the hospital. [place].  Contacts, dentures or bridgework may not be worn into surgery.  Leave suitcase in the car. After surgery it may be brought to your room.                   Please read over the following fact sheets you were given: _____________________________________________________________________             [facility] - Preparing for Surgery Before surgery, you can play an important role.  Because skin is not sterile, your skin needs to be as free of germs as possible.  You can reduce the number of germs on your skin by washing with CHG (chlorahexidine gluconate) soap before surgery.  CHG is an antiseptic cleaner which kills germs and bonds with the skin to continue killing germs even after washing. Please DO NOT use if you have an allergy to CHG or antibacterial soaps.  If your skin becomes reddened/irritated stop using the CHG and inform your nurse when you arrive at Short Stay. Do not  shave (including legs and underarms) for at least 48 hours prior to the first CHG shower.  You may shave your face/neck. Please follow these instructions carefully:  1.  Shower with CHG Soap the night before surgery and the  morning of Surgery.  2.  If you choose to wash your hair, wash your hair first as usual with your  normal  shampoo.  3.  After you shampoo, rinse your hair and body thoroughly to remove the  shampoo.                           4.  Use CHG as you would any other liquid soap.  You can apply chg directly  to the skin and wash                       Gently with a scrungie or clean washcloth.  5.  Apply the CHG Soap to your  body ONLY FROM THE NECK DOWN.   Do not use on face/ open                           Wound or open sores. Avoid contact with eyes, ears mouth and genitals (private parts).                       Wash face,  Genitals (private parts) with your normal soap.             6.  Wash thoroughly, paying special attention to the area where your surgery  will be performed.  7.  Thoroughly rinse your body with warm water from the neck down.  8.  DO NOT shower/wash with your normal soap after using and rinsing off  the CHG Soap.                9.  Pat yourself dry with a clean towel.            10.  Wear clean pajamas.            11.  Place clean sheets on your bed the night of your first shower and do not  sleep with pets. Day of Surgery : Do not apply any lotions/deodorants the morning of surgery.  Please wear clean clothes to the hospital/surgery center.  FAILURE TO FOLLOW THESE INSTRUCTIONS MAY RESULT IN THE CANCELLATION OF YOUR SURGERY PATIENT SIGNATURE_________________________________  NURSE SIGNATURE__________________________________  ________________________________________________________________________

## 2016-02-28 ENCOUNTER — Other Ambulatory Visit: Payer: Self-pay | Admitting: Internal Medicine

## 2016-02-29 ENCOUNTER — Encounter (HOSPITAL_COMMUNITY)
Admission: RE | Admit: 2016-02-29 | Discharge: 2016-02-29 | Disposition: A | Discharge status: Home / Self Care | Payer: Medicare Other | Source: Ambulatory Visit | Attending: Orthopaedic Surgery | Admitting: Orthopaedic Surgery

## 2016-02-29 ENCOUNTER — Ambulatory Visit: Payer: Medicare Other | Admitting: Infectious Disease

## 2016-02-29 ENCOUNTER — Other Ambulatory Visit: Payer: Self-pay | Admitting: Internal Medicine

## 2016-02-29 ENCOUNTER — Encounter (HOSPITAL_COMMUNITY): Payer: Self-pay

## 2016-02-29 ENCOUNTER — Other Ambulatory Visit (INDEPENDENT_AMBULATORY_CARE_PROVIDER_SITE_OTHER): Payer: Self-pay | Admitting: Physician Assistant

## 2016-02-29 DIAGNOSIS — Z7982 Long term (current) use of aspirin: Secondary | ICD-10-CM | POA: Diagnosis not present

## 2016-02-29 DIAGNOSIS — Z803 Family history of malignant neoplasm of breast: Secondary | ICD-10-CM | POA: Diagnosis not present

## 2016-02-29 DIAGNOSIS — Z91048 Other nonmedicinal substance allergy status: Secondary | ICD-10-CM | POA: Diagnosis not present

## 2016-02-29 DIAGNOSIS — Z7984 Long term (current) use of oral hypoglycemic drugs: Secondary | ICD-10-CM | POA: Diagnosis not present

## 2016-02-29 DIAGNOSIS — M86662 Other chronic osteomyelitis, left tibia and fibula: Secondary | ICD-10-CM | POA: Diagnosis not present

## 2016-02-29 DIAGNOSIS — E119 Type 2 diabetes mellitus without complications: Secondary | ICD-10-CM | POA: Insufficient documentation

## 2016-02-29 DIAGNOSIS — Z01818 Encounter for other preprocedural examination: Secondary | ICD-10-CM

## 2016-02-29 DIAGNOSIS — E785 Hyperlipidemia, unspecified: Secondary | ICD-10-CM | POA: Diagnosis not present

## 2016-02-29 DIAGNOSIS — Z8249 Family history of ischemic heart disease and other diseases of the circulatory system: Secondary | ICD-10-CM | POA: Diagnosis not present

## 2016-02-29 DIAGNOSIS — I252 Old myocardial infarction: Secondary | ICD-10-CM | POA: Diagnosis not present

## 2016-02-29 DIAGNOSIS — I509 Heart failure, unspecified: Secondary | ICD-10-CM | POA: Diagnosis not present

## 2016-02-29 DIAGNOSIS — T8454XA Infection and inflammatory reaction due to internal left knee prosthesis, initial encounter: Secondary | ICD-10-CM | POA: Diagnosis not present

## 2016-02-29 DIAGNOSIS — Z96652 Presence of left artificial knee joint: Secondary | ICD-10-CM | POA: Diagnosis not present

## 2016-02-29 DIAGNOSIS — Z833 Family history of diabetes mellitus: Secondary | ICD-10-CM | POA: Diagnosis not present

## 2016-02-29 DIAGNOSIS — Z79899 Other long term (current) drug therapy: Secondary | ICD-10-CM | POA: Diagnosis not present

## 2016-02-29 DIAGNOSIS — G4730 Sleep apnea, unspecified: Secondary | ICD-10-CM | POA: Diagnosis not present

## 2016-02-29 DIAGNOSIS — M1990 Unspecified osteoarthritis, unspecified site: Secondary | ICD-10-CM | POA: Diagnosis not present

## 2016-02-29 DIAGNOSIS — M869 Osteomyelitis, unspecified: Secondary | ICD-10-CM | POA: Diagnosis not present

## 2016-02-29 DIAGNOSIS — K219 Gastro-esophageal reflux disease without esophagitis: Secondary | ICD-10-CM | POA: Diagnosis not present

## 2016-02-29 DIAGNOSIS — Z951 Presence of aortocoronary bypass graft: Secondary | ICD-10-CM | POA: Diagnosis not present

## 2016-02-29 DIAGNOSIS — I110 Hypertensive heart disease with heart failure: Secondary | ICD-10-CM | POA: Diagnosis not present

## 2016-02-29 DIAGNOSIS — Z87891 Personal history of nicotine dependence: Secondary | ICD-10-CM | POA: Diagnosis not present

## 2016-02-29 DIAGNOSIS — Z808 Family history of malignant neoplasm of other organs or systems: Secondary | ICD-10-CM | POA: Diagnosis not present

## 2016-02-29 DIAGNOSIS — Z5181 Encounter for therapeutic drug level monitoring: Secondary | ICD-10-CM | POA: Diagnosis not present

## 2016-02-29 DIAGNOSIS — I2510 Atherosclerotic heart disease of native coronary artery without angina pectoris: Secondary | ICD-10-CM | POA: Diagnosis not present

## 2016-02-29 DIAGNOSIS — Z888 Allergy status to other drugs, medicaments and biological substances status: Secondary | ICD-10-CM | POA: Diagnosis not present

## 2016-02-29 HISTORY — DX: Prediabetes: R73.03

## 2016-02-29 HISTORY — DX: Abdominal aortic aneurysm, without rupture, unspecified: I71.40

## 2016-02-29 HISTORY — DX: Personal history of urinary calculi: Z87.442

## 2016-02-29 HISTORY — DX: Abdominal aortic aneurysm, without rupture: I71.4

## 2016-02-29 HISTORY — DX: Personal history of other diseases of the digestive system: Z87.19

## 2016-02-29 LAB — CBC
HEMATOCRIT: 31.8 % — AB (ref 39.0–52.0)
HEMOGLOBIN: 10.2 g/dL — AB (ref 13.0–17.0)
MCH: 27.7 pg (ref 26.0–34.0)
MCHC: 32.1 g/dL (ref 30.0–36.0)
MCV: 86.4 fL (ref 78.0–100.0)
Platelets: 176 10*3/uL (ref 150–400)
RBC: 3.68 MIL/uL — AB (ref 4.22–5.81)
RDW: 14.1 % (ref 11.5–15.5)
WBC: 5.4 10*3/uL (ref 4.0–10.5)

## 2016-02-29 LAB — BASIC METABOLIC PANEL
ANION GAP: 8 (ref 5–15)
BUN: 25 mg/dL — ABNORMAL HIGH (ref 6–20)
CO2: 27 mmol/L (ref 22–32)
Calcium: 10.2 mg/dL (ref 8.9–10.3)
Chloride: 105 mmol/L (ref 101–111)
Creatinine, Ser: 1.2 mg/dL (ref 0.61–1.24)
GFR calc Af Amer: >60 mL/min (ref >60)
GFR, EST NON AFRICAN AMERICAN: 59 mL/min — AB (ref >60)
Glucose, Bld: 99 mg/dL (ref 65–99)
POTASSIUM: 4.5 mmol/L (ref 3.5–5.1)
SODIUM: 140 mmol/L (ref 135–145)

## 2016-02-29 NOTE — Progress Notes (Signed)
   02/29/16 1344  OBSTRUCTIVE SLEEP APNEA  Have you ever been diagnosed with sleep apnea through a sleep study? No  Do you snore loudly (loud enough to be heard through closed doors)?  0  Do you often feel tired, fatigued, or sleepy during the daytime (such as falling asleep during driving or talking to someone)? 1  Has anyone observed you stop breathing during your sleep? 0  Do you have, or are you being treated for high blood pressure? 1  BMI more than 35 kg/m2? 1  Age > 50 (1-yes) 1  Neck circumference greater than:Male 16 inches or larger, Male 17inches or larger? 0  Male Gender (Yes=1) 1  Obstructive Sleep Apnea Score 5  Score 5 or greater  Results sent to PCP

## 2016-03-01 ENCOUNTER — Encounter: Payer: Self-pay | Admitting: Infectious Disease

## 2016-03-01 ENCOUNTER — Encounter (HOSPITAL_COMMUNITY): Payer: Self-pay

## 2016-03-01 ENCOUNTER — Other Ambulatory Visit (HOSPITAL_COMMUNITY): Payer: Self-pay | Admitting: *Deleted

## 2016-03-01 ENCOUNTER — Ambulatory Visit (INDEPENDENT_AMBULATORY_CARE_PROVIDER_SITE_OTHER): Payer: Medicare Other | Admitting: Orthopaedic Surgery

## 2016-03-01 DIAGNOSIS — T8454XA Infection and inflammatory reaction due to internal left knee prosthesis, initial encounter: Secondary | ICD-10-CM | POA: Diagnosis not present

## 2016-03-01 DIAGNOSIS — Z7984 Long term (current) use of oral hypoglycemic drugs: Secondary | ICD-10-CM | POA: Diagnosis not present

## 2016-03-01 LAB — TYPE AND SCREEN
ABO/RH(D): A NEG
ANTIBODY SCREEN: POSITIVE
DAT, IGG: POSITIVE

## 2016-03-01 LAB — HEMOGLOBIN A1C
Hgb A1c MFr Bld: 5.7 % — ABNORMAL HIGH (ref 4.8–5.6)
Mean Plasma Glucose: 117 mg/dL

## 2016-03-01 NOTE — Progress Notes (Signed)
bmet results faxed to dr Harrell Gave blackman by epic

## 2016-03-02 ENCOUNTER — Telehealth: Payer: Self-pay | Admitting: *Deleted

## 2016-03-02 DIAGNOSIS — M869 Osteomyelitis, unspecified: Secondary | ICD-10-CM | POA: Diagnosis not present

## 2016-03-02 NOTE — Telephone Encounter (Signed)
Thanks so much [place]. 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 NOW with osteomyelitis of his femur I think is going get above-the-knee amputation but he could check in on him postoperatively that be great

## 2016-03-02 NOTE — Telephone Encounter (Signed)
Patient is scheduled for surgery, 03/03/16 @ 10 AM.  The patient wanted Dr. Tommy Medal to know.

## 2016-03-03 ENCOUNTER — Encounter (HOSPITAL_COMMUNITY)
Admission: RE | Disposition: A | Discharge status: Home Health | Payer: Self-pay | Source: Ambulatory Visit | Attending: Orthopaedic Surgery

## 2016-03-03 ENCOUNTER — Inpatient Hospital Stay (HOSPITAL_COMMUNITY)
Admission: RE | Admit: 2016-03-03 | Discharge: 2016-03-06 | DRG: 476 | Disposition: A | Discharge status: Home Health | Payer: Medicare Other | Source: Ambulatory Visit | Attending: Orthopaedic Surgery | Admitting: Orthopaedic Surgery

## 2016-03-03 ENCOUNTER — Encounter (HOSPITAL_COMMUNITY): Payer: Self-pay | Admitting: *Deleted

## 2016-03-03 ENCOUNTER — Inpatient Hospital Stay (HOSPITAL_COMMUNITY): Payer: Medicare Other | Admitting: Anesthesiology

## 2016-03-03 DIAGNOSIS — Z8041 Family history of malignant neoplasm of ovary: Secondary | ICD-10-CM

## 2016-03-03 DIAGNOSIS — Z8249 Family history of ischemic heart disease and other diseases of the circulatory system: Secondary | ICD-10-CM | POA: Diagnosis not present

## 2016-03-03 DIAGNOSIS — M86152 Other acute osteomyelitis, left femur: Secondary | ICD-10-CM | POA: Diagnosis not present

## 2016-03-03 DIAGNOSIS — Z91048 Other nonmedicinal substance allergy status: Secondary | ICD-10-CM

## 2016-03-03 DIAGNOSIS — Z833 Family history of diabetes mellitus: Secondary | ICD-10-CM | POA: Diagnosis not present

## 2016-03-03 DIAGNOSIS — Z79899 Other long term (current) drug therapy: Secondary | ICD-10-CM

## 2016-03-03 DIAGNOSIS — Z7982 Long term (current) use of aspirin: Secondary | ICD-10-CM | POA: Diagnosis not present

## 2016-03-03 DIAGNOSIS — Z808 Family history of malignant neoplasm of other organs or systems: Secondary | ICD-10-CM

## 2016-03-03 DIAGNOSIS — T8744 Infection of amputation stump, left lower extremity: Secondary | ICD-10-CM | POA: Diagnosis not present

## 2016-03-03 DIAGNOSIS — I2510 Atherosclerotic heart disease of native coronary artery without angina pectoris: Secondary | ICD-10-CM | POA: Diagnosis present

## 2016-03-03 DIAGNOSIS — E785 Hyperlipidemia, unspecified: Secondary | ICD-10-CM | POA: Diagnosis present

## 2016-03-03 DIAGNOSIS — T8454XD Infection and inflammatory reaction due to internal left knee prosthesis, subsequent encounter: Secondary | ICD-10-CM | POA: Diagnosis not present

## 2016-03-03 DIAGNOSIS — I509 Heart failure, unspecified: Secondary | ICD-10-CM | POA: Diagnosis present

## 2016-03-03 DIAGNOSIS — M1990 Unspecified osteoarthritis, unspecified site: Secondary | ICD-10-CM | POA: Diagnosis present

## 2016-03-03 DIAGNOSIS — Z96652 Presence of left artificial knee joint: Secondary | ICD-10-CM | POA: Diagnosis present

## 2016-03-03 DIAGNOSIS — G4730 Sleep apnea, unspecified: Secondary | ICD-10-CM | POA: Diagnosis present

## 2016-03-03 DIAGNOSIS — M86151 Other acute osteomyelitis, right femur: Secondary | ICD-10-CM

## 2016-03-03 DIAGNOSIS — K219 Gastro-esophageal reflux disease without esophagitis: Secondary | ICD-10-CM | POA: Diagnosis present

## 2016-03-03 DIAGNOSIS — Z803 Family history of malignant neoplasm of breast: Secondary | ICD-10-CM

## 2016-03-03 DIAGNOSIS — M86652 Other chronic osteomyelitis, left thigh: Secondary | ICD-10-CM | POA: Diagnosis not present

## 2016-03-03 DIAGNOSIS — M86662 Other chronic osteomyelitis, left tibia and fibula: Principal | ICD-10-CM | POA: Diagnosis present

## 2016-03-03 DIAGNOSIS — B957 Other staphylococcus as the cause of diseases classified elsewhere: Secondary | ICD-10-CM

## 2016-03-03 DIAGNOSIS — Z951 Presence of aortocoronary bypass graft: Secondary | ICD-10-CM | POA: Diagnosis not present

## 2016-03-03 DIAGNOSIS — Z888 Allergy status to other drugs, medicaments and biological substances status: Secondary | ICD-10-CM | POA: Diagnosis not present

## 2016-03-03 DIAGNOSIS — Z95828 Presence of other vascular implants and grafts: Secondary | ICD-10-CM

## 2016-03-03 DIAGNOSIS — M869 Osteomyelitis, unspecified: Secondary | ICD-10-CM | POA: Diagnosis present

## 2016-03-03 DIAGNOSIS — Y792 Prosthetic and other implants, materials and accessory orthopedic devices associated with adverse incidents: Secondary | ICD-10-CM | POA: Diagnosis not present

## 2016-03-03 DIAGNOSIS — I252 Old myocardial infarction: Secondary | ICD-10-CM | POA: Diagnosis not present

## 2016-03-03 DIAGNOSIS — I70262 Atherosclerosis of native arteries of extremities with gangrene, left leg: Secondary | ICD-10-CM | POA: Diagnosis not present

## 2016-03-03 DIAGNOSIS — Z89612 Acquired absence of left leg above knee: Secondary | ICD-10-CM

## 2016-03-03 DIAGNOSIS — Z87891 Personal history of nicotine dependence: Secondary | ICD-10-CM

## 2016-03-03 DIAGNOSIS — I110 Hypertensive heart disease with heart failure: Secondary | ICD-10-CM | POA: Diagnosis not present

## 2016-03-03 DIAGNOSIS — G8918 Other acute postprocedural pain: Secondary | ICD-10-CM | POA: Diagnosis not present

## 2016-03-03 HISTORY — PX: AMPUTATION: SHX166

## 2016-03-03 LAB — GLUCOSE, CAPILLARY
Glucose-Capillary: 100 mg/dL — ABNORMAL HIGH (ref 65–99)
Glucose-Capillary: 119 mg/dL — ABNORMAL HIGH (ref 65–99)
Glucose-Capillary: 85 mg/dL (ref 65–99)
Glucose-Capillary: 107 mg/dL — ABNORMAL HIGH (ref 65–99)

## 2016-03-03 SURGERY — AMPUTATION, ABOVE KNEE
Anesthesia: Regional | Laterality: Left

## 2016-03-03 MED ORDER — FENTANYL CITRATE (PF) 100 MCG/2ML IJ SOLN
50.0000 ug | Freq: Once | INTRAMUSCULAR | Status: AC
  Administered 2016-03-03: 50 ug via INTRAVENOUS

## 2016-03-03 MED ORDER — FINASTERIDE 5 MG PO TABS
5.0000 mg | ORAL_TABLET | Freq: Every day | ORAL | Status: DC
  Administered 2016-03-04 – 2016-03-06 (×3): 5 mg via ORAL
  Filled 2016-03-03 (×3): qty 1

## 2016-03-03 MED ORDER — POLYVINYL ALCOHOL 1.4 % OP SOLN: 1.0000 [drp] | OPHTHALMIC | Status: DC | PRN

## 2016-03-03 MED ORDER — DOCUSATE SODIUM 100 MG PO CAPS
100.0000 mg | ORAL_CAPSULE | Freq: Two times a day (BID) | ORAL | Status: DC
  Administered 2016-03-03 – 2016-03-06 (×6): 100 mg via ORAL
  Filled 2016-03-03 (×6): qty 1

## 2016-03-03 MED ORDER — FUROSEMIDE 20 MG PO TABS
20.0000 mg | ORAL_TABLET | Freq: Every day | ORAL | Status: DC
Start: 2016-03-03 — End: 2016-03-06
  Administered 2016-03-03 – 2016-03-05 (×3): 20 mg via ORAL
  Filled 2016-03-03 (×3): qty 1

## 2016-03-03 MED ORDER — LIDOCAINE HCL 2 % IJ SOLN
INTRAMUSCULAR | Status: AC
  Filled 2016-03-03: qty 20

## 2016-03-03 MED ORDER — IBUPROFEN 400 MG PO TABS
400.0000 mg | ORAL_TABLET | Freq: Four times a day (QID) | ORAL | Status: DC | PRN
  Administered 2016-03-04 (×3): 400 mg via ORAL
  Filled 2016-03-03 (×3): qty 1

## 2016-03-03 MED ORDER — FENTANYL CITRATE (PF) 100 MCG/2ML IJ SOLN
INTRAMUSCULAR | Status: AC
  Filled 2016-03-03: qty 2

## 2016-03-03 MED ORDER — HYDROMORPHONE HCL 1 MG/ML IJ SOLN
1.0000 mg | INTRAMUSCULAR | Status: DC | PRN
  Administered 2016-03-04: 1 mg via INTRAVENOUS
  Filled 2016-03-03: qty 1

## 2016-03-03 MED ORDER — DEXTROSE 5 % IV SOLN
500.0000 mg | Freq: Four times a day (QID) | INTRAVENOUS | Status: DC | PRN
  Administered 2016-03-03: 500 mg via INTRAVENOUS
  Filled 2016-03-03: qty 5
  Filled 2016-03-03: qty 550

## 2016-03-03 MED ORDER — INSULIN ASPART 100 UNIT/ML ~~LOC~~ SOLN
0.0000 [IU] | Freq: Three times a day (TID) | SUBCUTANEOUS | Status: DC
  Administered 2016-03-04 – 2016-03-05 (×3): 2 [IU] via SUBCUTANEOUS

## 2016-03-03 MED ORDER — ASPIRIN EC 325 MG PO TBEC
325.0000 mg | DELAYED_RELEASE_TABLET | Freq: Every day | ORAL | Status: DC
  Administered 2016-03-03 – 2016-03-06 (×4): 325 mg via ORAL
  Filled 2016-03-03 (×4): qty 1

## 2016-03-03 MED ORDER — MEPERIDINE HCL 50 MG/ML IJ SOLN: 6.2500 mg | INTRAMUSCULAR | Status: DC | PRN

## 2016-03-03 MED ORDER — PROPOFOL 10 MG/ML IV BOLUS
INTRAVENOUS | Status: AC
  Filled 2016-03-03: qty 40

## 2016-03-03 MED ORDER — POTASSIUM CHLORIDE CRYS ER 20 MEQ PO TBCR
20.0000 meq | EXTENDED_RELEASE_TABLET | Freq: Every day | ORAL | Status: DC
  Administered 2016-03-03 – 2016-03-06 (×4): 20 meq via ORAL
  Filled 2016-03-03 (×4): qty 1

## 2016-03-03 MED ORDER — SODIUM CHLORIDE 0.9 % IV SOLN
INTRAVENOUS | Status: DC
  Administered 2016-03-03 – 2016-03-04 (×2): via INTRAVENOUS

## 2016-03-03 MED ORDER — GABAPENTIN 300 MG PO CAPS
300.0000 mg | ORAL_CAPSULE | Freq: Two times a day (BID) | ORAL | Status: DC
  Administered 2016-03-03 – 2016-03-06 (×6): 300 mg via ORAL
  Filled 2016-03-03 (×6): qty 1

## 2016-03-03 MED ORDER — METHOCARBAMOL 500 MG PO TABS: 500.0000 mg | ORAL_TABLET | Freq: Four times a day (QID) | ORAL | Status: DC | PRN

## 2016-03-03 MED ORDER — PROPOFOL 500 MG/50ML IV EMUL: INTRAVENOUS | Status: DC | PRN

## 2016-03-03 MED ORDER — EPHEDRINE SULFATE 50 MG/ML IJ SOLN
INTRAMUSCULAR | Status: DC | PRN
  Administered 2016-03-03: 5 mg via INTRAVENOUS
  Administered 2016-03-03: 10 mg via INTRAVENOUS

## 2016-03-03 MED ORDER — EPHEDRINE 5 MG/ML INJ
INTRAVENOUS | Status: AC
  Filled 2016-03-03: qty 10

## 2016-03-03 MED ORDER — ALFUZOSIN HCL ER 10 MG PO TB24
10.0000 mg | ORAL_TABLET | Freq: Every day | ORAL | Status: DC
  Administered 2016-03-04 – 2016-03-06 (×3): 10 mg via ORAL
  Filled 2016-03-03 (×3): qty 1

## 2016-03-03 MED ORDER — METOCLOPRAMIDE HCL 5 MG PO TABS: 5.0000 mg | ORAL_TABLET | Freq: Three times a day (TID) | ORAL | Status: DC | PRN

## 2016-03-03 MED ORDER — PROMETHAZINE HCL 25 MG/ML IJ SOLN: 6.2500 mg | INTRAMUSCULAR | Status: DC | PRN

## 2016-03-03 MED ORDER — HYDROMORPHONE HCL 1 MG/ML IJ SOLN
INTRAMUSCULAR | Status: AC
  Administered 2016-03-03: 0.5 mg via INTRAVENOUS
  Filled 2016-03-03: qty 0.5

## 2016-03-03 MED ORDER — MIDAZOLAM HCL 2 MG/2ML IJ SOLN
INTRAMUSCULAR | Status: AC
Start: 2016-03-03 — End: 2016-03-03
  Filled 2016-03-03: qty 2

## 2016-03-03 MED ORDER — HYDROMORPHONE HCL 1 MG/ML IJ SOLN
INTRAMUSCULAR | Status: AC
  Filled 2016-03-03: qty 0.5

## 2016-03-03 MED ORDER — CALCIUM CARBONATE-VITAMIN D 500-200 MG-UNIT PO TABS
1.0000 | ORAL_TABLET | Freq: Every day | ORAL | Status: DC
  Administered 2016-03-03 – 2016-03-05 (×3): 1 via ORAL
  Filled 2016-03-03 (×3): qty 1

## 2016-03-03 MED ORDER — LIDOCAINE 2% (20 MG/ML) 5 ML SYRINGE
INTRAMUSCULAR | Status: AC
  Filled 2016-03-03: qty 5

## 2016-03-03 MED ORDER — VITAMIN C 500 MG PO TABS
500.0000 mg | ORAL_TABLET | Freq: Every day | ORAL | Status: DC
  Administered 2016-03-03 – 2016-03-06 (×4): 500 mg via ORAL
  Filled 2016-03-03 (×4): qty 1

## 2016-03-03 MED ORDER — FENTANYL CITRATE (PF) 100 MCG/2ML IJ SOLN
INTRAMUSCULAR | Status: DC | PRN
  Administered 2016-03-03: 50 ug via INTRAVENOUS

## 2016-03-03 MED ORDER — BUPIVACAINE HCL (PF) 0.5 % IJ SOLN
INTRAMUSCULAR | Status: AC
  Filled 2016-03-03: qty 60

## 2016-03-03 MED ORDER — TAMSULOSIN HCL 0.4 MG PO CAPS
0.4000 mg | ORAL_CAPSULE | Freq: Every day | ORAL | Status: DC
  Administered 2016-03-04 – 2016-03-06 (×3): 0.4 mg via ORAL
  Filled 2016-03-03 (×3): qty 1

## 2016-03-03 MED ORDER — ATORVASTATIN CALCIUM 20 MG PO TABS
20.0000 mg | ORAL_TABLET | Freq: Every day | ORAL | Status: DC
  Administered 2016-03-03 – 2016-03-05 (×3): 20 mg via ORAL
  Filled 2016-03-03 (×3): qty 1

## 2016-03-03 MED ORDER — LACTATED RINGERS IV SOLN
INTRAVENOUS | Status: DC
  Administered 2016-03-03 (×2): via INTRAVENOUS

## 2016-03-03 MED ORDER — ACETAMINOPHEN 10 MG/ML IV SOLN
INTRAVENOUS | Status: AC
  Filled 2016-03-03: qty 100

## 2016-03-03 MED ORDER — HYDROMORPHONE HCL 1 MG/ML IJ SOLN
0.2500 mg | INTRAMUSCULAR | Status: DC | PRN
  Administered 2016-03-03 (×2): 0.5 mg via INTRAVENOUS

## 2016-03-03 MED ORDER — CEFAZOLIN SODIUM-DEXTROSE 2-4 GM/100ML-% IV SOLN
INTRAVENOUS | Status: AC
  Filled 2016-03-03: qty 100

## 2016-03-03 MED ORDER — LORAZEPAM 1 MG PO TABS
1.0000 mg | ORAL_TABLET | Freq: Three times a day (TID) | ORAL | Status: DC | PRN
  Administered 2016-03-05: 1 mg via ORAL
  Filled 2016-03-03: qty 1

## 2016-03-03 MED ORDER — ONDANSETRON HCL 4 MG PO TABS: 4.0000 mg | ORAL_TABLET | Freq: Four times a day (QID) | ORAL | Status: DC | PRN

## 2016-03-03 MED ORDER — POLYETHYL GLYCOL-PROPYL GLYCOL 0.4-0.3 % OP SOLN: 1.0000 [drp] | Freq: Every day | OPHTHALMIC | Status: DC | PRN

## 2016-03-03 MED ORDER — ACETAMINOPHEN 325 MG PO TABS
650.0000 mg | ORAL_TABLET | Freq: Four times a day (QID) | ORAL | Status: DC | PRN
  Administered 2016-03-03 – 2016-03-04 (×3): 650 mg via ORAL
  Filled 2016-03-03 (×3): qty 2

## 2016-03-03 MED ORDER — CEFAZOLIN SODIUM-DEXTROSE 2-4 GM/100ML-% IV SOLN
2.0000 g | Freq: Four times a day (QID) | INTRAVENOUS | Status: AC
  Administered 2016-03-03 – 2016-03-04 (×3): 2 g via INTRAVENOUS
  Filled 2016-03-03 (×3): qty 100

## 2016-03-03 MED ORDER — ONDANSETRON HCL 4 MG/2ML IJ SOLN
INTRAMUSCULAR | Status: AC
  Filled 2016-03-03: qty 2

## 2016-03-03 MED ORDER — DIPHENHYDRAMINE HCL 12.5 MG/5ML PO ELIX: 12.5000 mg | ORAL_SOLUTION | ORAL | Status: DC | PRN

## 2016-03-03 MED ORDER — MIDAZOLAM HCL 2 MG/2ML IJ SOLN
2.0000 mg | Freq: Once | INTRAMUSCULAR | Status: AC
  Administered 2016-03-03: 2 mg via INTRAVENOUS

## 2016-03-03 MED ORDER — CALCIUM CARB-CHOLECALCIFEROL 500-400 MG-UNIT PO TABS: 1.0000 | ORAL_TABLET | Freq: Every day | ORAL | Status: DC

## 2016-03-03 MED ORDER — CARVEDILOL 12.5 MG PO TABS
12.5000 mg | ORAL_TABLET | Freq: Two times a day (BID) | ORAL | Status: DC
  Administered 2016-03-03 – 2016-03-06 (×6): 12.5 mg via ORAL
  Filled 2016-03-03 (×6): qty 1

## 2016-03-03 MED ORDER — PROPOFOL 500 MG/50ML IV EMUL
INTRAVENOUS | Status: DC | PRN
  Administered 2016-03-03: 150 mg via INTRAVENOUS
  Administered 2016-03-03: 40 mg via INTRAVENOUS

## 2016-03-03 MED ORDER — ACETAMINOPHEN 10 MG/ML IV SOLN
INTRAVENOUS | Status: DC | PRN
  Administered 2016-03-03: 1000 mg via INTRAVENOUS

## 2016-03-03 MED ORDER — ACETAMINOPHEN 650 MG RE SUPP: 650.0000 mg | Freq: Four times a day (QID) | RECTAL | Status: DC | PRN

## 2016-03-03 MED ORDER — CEFAZOLIN SODIUM-DEXTROSE 2-4 GM/100ML-% IV SOLN
2.0000 g | INTRAVENOUS | Status: AC
  Administered 2016-03-03: 2 g via INTRAVENOUS
  Filled 2016-03-03: qty 100

## 2016-03-03 MED ORDER — BUPIVACAINE HCL (PF) 0.5 % IJ SOLN
INTRAMUSCULAR | Status: DC | PRN
  Administered 2016-03-03: 40 mL via PERINEURAL

## 2016-03-03 MED ORDER — MIDAZOLAM HCL 2 MG/2ML IJ SOLN
INTRAMUSCULAR | Status: AC
  Filled 2016-03-03: qty 2

## 2016-03-03 MED ORDER — PANTOPRAZOLE SODIUM 40 MG PO TBEC
40.0000 mg | DELAYED_RELEASE_TABLET | Freq: Two times a day (BID) | ORAL | Status: DC
  Administered 2016-03-03 – 2016-03-06 (×6): 40 mg via ORAL
  Filled 2016-03-03 (×6): qty 1

## 2016-03-03 MED ORDER — OXYCODONE HCL 5 MG PO TABS
5.0000 mg | ORAL_TABLET | ORAL | Status: DC | PRN
  Administered 2016-03-03: 5 mg via ORAL
  Administered 2016-03-03 – 2016-03-04 (×9): 10 mg via ORAL
  Administered 2016-03-05: 5 mg via ORAL
  Administered 2016-03-05 (×3): 10 mg via ORAL
  Administered 2016-03-06 (×2): 5 mg via ORAL
  Filled 2016-03-03: qty 1
  Filled 2016-03-03 (×5): qty 2
  Filled 2016-03-03: qty 1
  Filled 2016-03-03 (×4): qty 2
  Filled 2016-03-03: qty 1
  Filled 2016-03-03: qty 2
  Filled 2016-03-03: qty 1
  Filled 2016-03-03 (×2): qty 2

## 2016-03-03 MED ORDER — ONDANSETRON HCL 4 MG/2ML IJ SOLN
INTRAMUSCULAR | Status: DC | PRN
  Administered 2016-03-03: 4 mg via INTRAVENOUS

## 2016-03-03 MED ORDER — ONDANSETRON HCL 4 MG/2ML IJ SOLN: 4.0000 mg | Freq: Four times a day (QID) | INTRAMUSCULAR | Status: DC | PRN

## 2016-03-03 MED ORDER — FREESTYLE LANCETS MISC: 1.0000 | Freq: Two times a day (BID) | Status: DC

## 2016-03-03 MED ORDER — ADULT MULTIVITAMIN W/MINERALS CH
1.0000 | ORAL_TABLET | Freq: Every day | ORAL | Status: DC
  Administered 2016-03-04 – 2016-03-06 (×3): 1 via ORAL
  Filled 2016-03-03 (×3): qty 1

## 2016-03-03 MED ORDER — METOCLOPRAMIDE HCL 5 MG/ML IJ SOLN: 5.0000 mg | Freq: Three times a day (TID) | INTRAMUSCULAR | Status: DC | PRN

## 2016-03-03 MED ORDER — METHOCARBAMOL 500 MG PO TABS
500.0000 mg | ORAL_TABLET | Freq: Four times a day (QID) | ORAL | Status: DC | PRN
  Administered 2016-03-03 – 2016-03-05 (×6): 500 mg via ORAL
  Filled 2016-03-03 (×6): qty 1

## 2016-03-03 MED ORDER — FLUOXETINE HCL 20 MG PO CAPS
40.0000 mg | ORAL_CAPSULE | Freq: Every day | ORAL | Status: DC
  Administered 2016-03-04 – 2016-03-06 (×3): 40 mg via ORAL
  Filled 2016-03-03 (×3): qty 2

## 2016-03-03 MED ORDER — LIDOCAINE HCL (PF) 2 % IJ SOLN
INTRAMUSCULAR | Status: DC | PRN
  Administered 2016-03-03: 20 mL via PERINEURAL

## 2016-03-03 MED ORDER — INSULIN ASPART 100 UNIT/ML ~~LOC~~ SOLN: 0.0000 [IU] | Freq: Every day | SUBCUTANEOUS | Status: DC

## 2016-03-03 SURGICAL SUPPLY — 51 items
BAG ZIPLOCK 12X15 (MISCELLANEOUS) ×2 IMPLANT
BANDAGE ACE 4X5 VEL STRL LF (GAUZE/BANDAGES/DRESSINGS) ×2 IMPLANT
BANDAGE ACE 6X5 VEL STRL LF (GAUZE/BANDAGES/DRESSINGS) ×2 IMPLANT
BANDAGE ESMARK 6X9 LF (GAUZE/BANDAGES/DRESSINGS) ×1 IMPLANT
BLADE SAGITTAL 25.0X1.37X90 (BLADE) ×2 IMPLANT
BNDG COHESIVE 4X5 TAN STRL (GAUZE/BANDAGES/DRESSINGS) ×2 IMPLANT
BNDG COHESIVE 6X5 TAN STRL LF (GAUZE/BANDAGES/DRESSINGS) ×2 IMPLANT
BNDG ESMARK 6X9 LF (GAUZE/BANDAGES/DRESSINGS) ×2
BNDG GAUZE ELAST 4 BULKY (GAUZE/BANDAGES/DRESSINGS) ×4 IMPLANT
COVER SURGICAL LIGHT HANDLE (MISCELLANEOUS) ×2 IMPLANT
CUFF TOURN SGL QUICK 34 (TOURNIQUET CUFF) ×1
CUFF TRNQT CYL 34X4X40X1 (TOURNIQUET CUFF) ×1 IMPLANT
DRAIN PENROSE 18X1/2 LTX STRL (DRAIN) IMPLANT
DRAPE ORTHO SPLIT 77X108 STRL (DRAPES)
DRAPE SHEET LG 3/4 BI-LAMINATE (DRAPES) ×4 IMPLANT
DRAPE SURG ORHT 6 SPLT 77X108 (DRAPES) IMPLANT
DRAPE U-SHAPE 47X51 STRL (DRAPES) ×2 IMPLANT
DRSG PAD ABDOMINAL 8X10 ST (GAUZE/BANDAGES/DRESSINGS) ×2 IMPLANT
DURAPREP 26ML APPLICATOR (WOUND CARE) ×2 IMPLANT
ELECT REM PT RETURN 9FT ADLT (ELECTROSURGICAL) ×2
ELECTRODE REM PT RTRN 9FT ADLT (ELECTROSURGICAL) ×1 IMPLANT
EVACUATOR 1/8 PVC DRAIN (DRAIN) IMPLANT
GAUZE SPONGE 4X4 12PLY STRL (GAUZE/BANDAGES/DRESSINGS) ×2 IMPLANT
GAUZE XEROFORM 5X9 LF (GAUZE/BANDAGES/DRESSINGS) ×2 IMPLANT
GLOVE BIOGEL PI IND STRL 8 (GLOVE) ×1 IMPLANT
GLOVE BIOGEL PI INDICATOR 8 (GLOVE) ×1
GLOVE ECLIPSE 8.0 STRL XLNG CF (GLOVE) ×2 IMPLANT
GLOVE ORTHO TXT STRL SZ7.5 (GLOVE) ×2 IMPLANT
GOWN STRL REUS W/TWL XL LVL3 (GOWN DISPOSABLE) ×2 IMPLANT
KIT BASIN OR (CUSTOM PROCEDURE TRAY) ×2 IMPLANT
NS IRRIG 1000ML POUR BTL (IV SOLUTION) ×2 IMPLANT
PACK ORTHO EXTREMITY (CUSTOM PROCEDURE TRAY) ×2 IMPLANT
PAD CAST 4YDX4 CTTN HI CHSV (CAST SUPPLIES) ×1 IMPLANT
PADDING CAST COTTON 4X4 STRL (CAST SUPPLIES) ×1
PADDING CAST COTTON 6X4 STRL (CAST SUPPLIES) ×2 IMPLANT
POSITIONER SURGICAL ARM (MISCELLANEOUS) ×2 IMPLANT
SPONGE LAP 18X18 X RAY DECT (DISPOSABLE) ×4 IMPLANT
STAPLER VISISTAT 35W (STAPLE) ×2 IMPLANT
STOCKINETTE 8 INCH (MISCELLANEOUS) ×2 IMPLANT
SUT ETHILON 2 0 PSLX (SUTURE) ×6 IMPLANT
SUT SILK 2 0 (SUTURE)
SUT SILK 2 0 SH CR/8 (SUTURE) ×2 IMPLANT
SUT SILK 2-0 18XBRD TIE 12 (SUTURE) IMPLANT
SUT VIC AB 0 CT1 27 (SUTURE) ×3
SUT VIC AB 0 CT1 27XBRD ANTBC (SUTURE) ×3 IMPLANT
SUT VIC AB 2-0 CT1 27 (SUTURE) ×3
SUT VIC AB 2-0 CT1 TAPERPNT 27 (SUTURE) ×3 IMPLANT
SWAB COLLECTION DEVICE MRSA (MISCELLANEOUS) IMPLANT
SWAB CULTURE ESWAB REG 1ML (MISCELLANEOUS) IMPLANT
TOWEL OR 17X26 10 PK STRL BLUE (TOWEL DISPOSABLE) ×6 IMPLANT
WATER STERILE IRR 1500ML POUR (IV SOLUTION) ×2 IMPLANT

## 2016-03-03 NOTE — Anesthesia Postprocedure Evaluation (Signed)
Anesthesia Post Note  Patient: Steven Charles  Procedure(s) Performed: Procedure(s) (LRB): LEFT ABOVE KNEE AMPUTATION (Left)  Patient location during evaluation: PACU Anesthesia Type: General and Regional Level of consciousness: patient cooperative and awake Pain management: pain level controlled Vital Signs Assessment: post-procedure vital signs reviewed and stable Respiratory status: spontaneous breathing Cardiovascular status: stable Anesthetic complications: no    Last Vitals:  Vitals:   03/03/16 1150 03/03/16 1211  BP: 128/63 (!) 134/57  Pulse: 63 (!) 58  Resp: 18 18  Temp: 36.8 C 36.9 C    Last Pain:  Vitals:   03/03/16 1137  TempSrc:   PainSc: Pax
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
## 2016-03-03 NOTE — Anesthesia Procedure Notes (Signed)
Procedure Name: LMA Insertion Date/Time: 03/03/2016 9:39 AM Performed by: Glory Buff Pre-anesthesia Checklist: Patient identified, Emergency Drugs available, Suction available and Patient being monitored Patient Re-evaluated:Patient Re-evaluated prior to inductionOxygen Delivery Method: Circle system utilized Preoxygenation: Pre-oxygenation with 100% oxygen Intubation Type: IV induction LMA: LMA with gastric port inserted LMA Size: 4.0 Number of attempts: 1 Placement Confirmation: positive ETCO2 Tube secured with: Tape Dental Injury: Teeth and Oropharynx as per pre-operative assessment

## 2016-03-03 NOTE — Anesthesia Procedure Notes (Signed)
Anesthesia Regional Block:  Nerve block type: Sciatic Block.  Pre-Anesthetic Checklist: ,, timeout performed, Correct Patient, Correct Site, Correct Laterality, Correct Procedure, Correct Position, site marked, Risks and benefits discussed,  Surgical consent,  Pre-op evaluation,  At surgeon's request and post-op pain management  Laterality: Left  Prep: chloraprep       Needles:  Injection technique: Single-shot  Needle Type: Stimiplex     Needle Length: 10cm 10 cm Needle Gauge: 21 and 21 G    Additional Needles:  Procedures: ultrasound guided (picture in chart) and nerve stimulator  Motor weakness within 5 minutes. Nerve block type: Sciatic Block.  Nerve Stimulator or Paresthesia:  Response: Plantar flexion/toe flexion, 0.5 mA,   Additional Responses:   Narrative:  Start time: 03/03/2016 9:05 AM End time: 03/03/2016 9:15 AM Injection made incrementally with aspirations every 5 mL.  Performed by: Personally  Anesthesiologist: Nolon Nations  Additional Notes: Nerve located and needle positioned with direct ultrasound guidance. Good perineural spread. Patient tolerated well.

## 2016-03-03 NOTE — Anesthesia Procedure Notes (Signed)
Anesthesia Regional Block:  Femoral nerve block  Pre-Anesthetic Checklist: ,, timeout performed, Correct Patient, Correct Site, Correct Laterality, Correct Procedure, Correct Position, site marked, Risks and benefits discussed,  Surgical consent,  Pre-op evaluation,  At surgeon's request and post-op pain management  Laterality: Left  Prep: chloraprep       Needles:  Injection technique: Single-shot  Needle Type: Stimulator Needle - 80     Needle Length: 9cm 9 cm Needle Gauge: 22 and 22 G  Needle insertion depth: 6 cm   Additional Needles:  Procedures: ultrasound guided (picture in chart) and nerve stimulator Femoral nerve block  Nerve Stimulator or Paresthesia:  Response: Patellar snap, 0.5 mA,   Additional Responses:   Narrative:  Start time: 03/03/2016 8:55 AM End time: 03/03/2016 9:05 AM Injection made incrementally with aspirations every 5 mL.  Performed by: Personally  Anesthesiologist: Nolon Nations  Additional Notes: BP cuff, EKG monitors applied. Sedation begun. Femoral artery palpated for location of nerve. After nerve location verified with U/S, anesthetic injected incrementally, slowly, and after negative aspirations under direct u/s guidance. Good perineural spread. Patient tolerated well.

## 2016-03-03 NOTE — Progress Notes (Signed)
Assisted Dr. Lissa Hoard with left, ultrasound guided, femoral, saphenous block. Side rails up, monitors on throughout procedure. See vital signs in flow sheet. Tolerated Procedure well.

## 2016-03-03 NOTE — Progress Notes (Signed)
[facility]  Patient Status: Active (receiving services up to time of hospitalization)      [facility] is providing the following services: PT, RN  If patient discharges after hours, please call [phone].   Steven Charles 03/03/2016, 2:40 PM

## 2016-03-03 NOTE — Anesthesia Preprocedure Evaluation (Addendum)
Anesthesia Evaluation  Patient identified by MRN, date of birth, ID band Patient awake    Reviewed: Allergy & Precautions, NPO status , Patient's Chart, lab work & pertinent test results, reviewed documented beta blocker date and time   History of Anesthesia Complications (+) history of anesthetic complications  Airway Mallampati: II  TM Distance: >3 FB Neck ROM: Full    Dental  (+) Upper Dentures, Dental Advisory Given   Pulmonary sleep apnea , pneumonia, resolved, former smoker,    Pulmonary exam normal breath sounds clear to auscultation       Cardiovascular hypertension, Pt. on medications and Pt. on home beta blockers + CAD, + Past MI, + CABG, + Peripheral Vascular Disease and +CHF  Normal cardiovascular exam Rhythm:Regular Rate:Normal  AAA   Neuro/Psych PSYCHIATRIC DISORDERS Anxiety Depression  Neuromuscular disease    GI/Hepatic Neg liver ROS, GERD  Medicated and Controlled,  Endo/Other  diabetes, Well Controlled, Type 2, Oral Hypoglycemic AgentsMorbid obesity  Renal/GU Renal diseasenegative Renal ROS  negative genitourinary   Musculoskeletal  (+) Arthritis , Osteoarthritis,  Chronic infection left knee   Abdominal (+) + obese,   Peds  Hematology  (+) Blood dyscrasia, anemia ,   Anesthesia Other Findings   Reproductive/Obstetrics                             Lab Results  Component Value Date   WBC 5.4 02/29/2016   HGB 10.2 (L) 02/29/2016   HCT 31.8 (L) 02/29/2016   MCV 86.4 02/29/2016   PLT 176 02/29/2016     Chemistry      Component Value Date/Time   NA 140 02/29/2016 1419   K 4.5 02/29/2016 1419   CL 105 02/29/2016 1419   CO2 27 02/29/2016 1419   BUN 25 (H) 02/29/2016 1419   CREATININE 1.20 02/29/2016 1419      Component Value Date/Time   CALCIUM 10.2 02/29/2016 1419   ALKPHOS 103 10/02/2015 1457   AST 9 10/02/2015 1457   ALT 1 10/02/2015 1457   BILITOT 0.4  10/02/2015 1457     EKG: normal sinus rhythm, Q waves in II, III, aVF, old inferior wall MI. Echo 01/06/2014: LVH, LVEF 50-55%, grade II diastolic dysfunction.  Anesthesia Physical  Anesthesia Plan  ASA: III  Anesthesia Plan: Regional and General   Post-op Pain Management: GA combined w/ Regional for post-op pain   Induction: Intravenous  Airway Management Planned: LMA  Additional Equipment:   Intra-op Plan:   Post-operative Plan: Extubation in OR  Informed Consent: I have reviewed the patients History and Physical, chart, labs and discussed the procedure including the risks, benefits and alternatives for the proposed anesthesia with the patient or authorized representative who has indicated his/her understanding and acceptance.   Dental advisory given  Plan Discussed with: CRNA  Anesthesia Plan Comments: ( )       Anesthesia Quick Evaluation
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
## 2016-03-03 NOTE — Transfer of Care (Signed)
Immediate Anesthesia Transfer of Care Note  Patient: Steven Charles  Procedure(s) Performed: Procedure(s): LEFT ABOVE KNEE AMPUTATION (Left)  Patient Location: PACU  Anesthesia Type:General and Regional  Level of Consciousness: awake, alert  and oriented  Airway & Oxygen Therapy: Patient Spontanous Breathing and Patient connected to face mask oxygen  Post-op Assessment: Report given to RN and Post -op Vital signs reviewed and stable  Post vital signs: Reviewed and stable  Last Vitals:  Vitals:   03/03/16 0923 03/03/16 0924  BP:    Pulse: (!) 59 65  Resp: 10 12  Temp:      Last Pain:  Vitals:   03/03/16 0800  TempSrc: Oral      Patients Stated Pain Goal: 4 ([id number])  Complications: No apparent anesthesia complications

## 2016-03-03 NOTE — Brief Op Note (Signed)
03/03/2016  11:06 AM  PATIENT:  Steven Charles  70 y.o. male  PRE-OPERATIVE DIAGNOSIS:  chonic infection left knee  POST-OPERATIVE DIAGNOSIS:  chonic infection left knee  PROCEDURE:  Procedure(s): LEFT ABOVE KNEE AMPUTATION (Left)  SURGEON:  Surgeon(s) and Role:    * Mcarthur Rossetti, MD - Primary  PHYSICIAN ASSISTANT: Benita Stabile, PA-C  ANESTHESIA:   regional and general  EBL:  Total I/O In: 1000 [I.V.:1000] Out: 200 [Blood:200]  COUNTS:  YES  TOURNIQUET:   Total Tourniquet Time Documented: Thigh (Left) - 26 minutes Total: Thigh (Left) - 26 minutes   DICTATION: .Other Dictation: Dictation Number [id number]  PLAN OF CARE: Admit to inpatient   PATIENT DISPOSITION:  PACU - hemodynamically stable.   Delay start of Pharmacological VTE agent (>24hrs) due to surgical blood loss or risk of bleeding: no

## 2016-03-04 ENCOUNTER — Other Ambulatory Visit: Payer: Self-pay

## 2016-03-04 ENCOUNTER — Inpatient Hospital Stay (HOSPITAL_COMMUNITY): Admission: RE | Admit: 2016-03-04 | Payer: Medicare Other | Source: Ambulatory Visit

## 2016-03-04 LAB — BASIC METABOLIC PANEL
Anion gap: 7 (ref 5–15)
BUN: 16 mg/dL (ref 6–20)
CHLORIDE: 103 mmol/L (ref 101–111)
CO2: 26 mmol/L (ref 22–32)
Calcium: 9.1 mg/dL (ref 8.9–10.3)
Creatinine, Ser: 1.03 mg/dL (ref 0.61–1.24)
GFR calc Af Amer: >60 mL/min (ref >60)
GFR calc non Af Amer: >60 mL/min (ref >60)
GLUCOSE: 104 mg/dL — AB (ref 65–99)
POTASSIUM: 3.6 mmol/L (ref 3.5–5.1)
Sodium: 136 mmol/L (ref 135–145)

## 2016-03-04 LAB — CBC
HEMATOCRIT: 27.5 % — AB (ref 39.0–52.0)
HEMOGLOBIN: 9.2 g/dL — AB (ref 13.0–17.0)
MCH: 28.4 pg (ref 26.0–34.0)
MCHC: 33.5 g/dL (ref 30.0–36.0)
MCV: 84.9 fL (ref 78.0–100.0)
Platelets: 128 10*3/uL — ABNORMAL LOW (ref 150–400)
RBC: 3.24 MIL/uL — AB (ref 4.22–5.81)
RDW: 13.8 % (ref 11.5–15.5)
WBC: 7.1 10*3/uL (ref 4.0–10.5)

## 2016-03-04 LAB — GLUCOSE, CAPILLARY
GLUCOSE-CAPILLARY: 123 mg/dL — AB (ref 65–99)
GLUCOSE-CAPILLARY: 116 mg/dL — AB (ref 65–99)
GLUCOSE-CAPILLARY: 139 mg/dL — AB (ref 65–99)
Glucose-Capillary: 100 mg/dL — ABNORMAL HIGH (ref 65–99)

## 2016-03-04 MED ORDER — ATORVASTATIN CALCIUM 40 MG PO TABS: 40.0000 mg | ORAL_TABLET | Freq: Every day | ORAL | 2 refills | Status: DC

## 2016-03-04 MED ORDER — HYDROMORPHONE HCL 2 MG/ML IJ SOLN
1.0000 mg | INTRAMUSCULAR | Status: DC | PRN
  Administered 2016-03-04 – 2016-03-05 (×3): 1 mg via INTRAVENOUS
  Filled 2016-03-04 (×4): qty 1

## 2016-03-04 NOTE — Evaluation (Signed)
Occupational Therapy Evaluation Patient Details Name: Steven Charles MRN: [id number] DOB: May 22, 1945 Today's Date: 03/04/2016    History of Present Illness Pt is s/p L AKA due to osteomylelitis. H/o Multiple L knee surgeries and chronic infection   Clinical Impression   This 70 year old man was admitted for the above surgery. Will follow in acute with supervision level goals.  Pt was independent prior to this sx, and he currently needs min A +2 for safety    Follow Up Recommendations  Supervision/Assistance - 24 hour    Equipment Recommendations  None recommended by OT    Recommendations for Other Services       Precautions / Restrictions Precautions Precautions: Fall Restrictions Weight Bearing Restrictions:  (LLE NWB--new AKA)      Mobility Bed Mobility               General bed mobility comments: supervision, HOB raised  Transfers Overall transfer level: Needs assistance Equipment used: Rolling walker (2 wheeled) Transfers: Sit to/from Stand Sit to Stand: Min assist;+2 safety/equipment         General transfer comment: for safety.      Balance                                            ADL Overall ADL's : Needs assistance/impaired     Grooming: Set up;Sitting   Upper Body Bathing: Set up;Sitting   Lower Body Bathing: Minimal assistance;Sit to/from stand   Upper Body Dressing : Set up;Sitting   Lower Body Dressing: Minimal assistance;Sit to/from stand                 General ADL Comments: pt able to hop to door and back to bed.  He states that he has been non-weight bearing and has done this at home.  He has all DME and assistance from wife.  Pt felt a little dizzy from pain medication.  BP 104/52 sitting but no further dizziness with sitting or standing     Vision     Perception     Praxis      Pertinent Vitals/Pain Pain Assessment: Faces Faces Pain Scale: Hurts even more Pain Location: phantom pain, L  knee Pain Intervention(s): Limited activity within patient's tolerance;Monitored during session;Premedicated before session;Repositioned;Ice applied     Hand Dominance     Extremity/Trunk Assessment Upper Extremity Assessment Upper Extremity Assessment: Overall WFL for tasks assessed           Communication     Cognition Arousal/Alertness: Awake/alert Behavior During Therapy: WFL for tasks assessed/performed Overall Cognitive Status: Within Functional Limits for tasks assessed                     General Comments       Exercises       Shoulder Instructions      Home Living Family/patient expects to be discharged to:: Private residence Living Arrangements: Spouse/significant other;Children Available Help at Discharge: Family Type of Home: House                       Home Equipment: Kasandra Knudsen - single point;Walker - 2 wheels;Bedside commode;Shower seat;Wheelchair - manual          Prior Functioning/Environment Level of Independence: Independent with assistive device(s)  OT Problem List: Decreased strength;Decreased activity tolerance;Decreased knowledge of use of DME or AE;Pain   OT Treatment/Interventions: Self-care/ADL training;DME and/or AE instruction;Patient/family education    OT Goals(Current goals can be found in the care plan section) Acute Rehab OT Goals Patient Stated Goal: home; less pain OT Goal Formulation: With patient Time For Goal Achievement: 03/11/16 Potential to Achieve Goals: Good ADL Goals Pt Will Transfer to Toilet: with supervision;bedside commode (hopping vs SPT) Pt Will Perform Toileting - Clothing Manipulation and hygiene: with supervision;sit to/from stand Additional ADL Goal #1: pt will perform LB adl with set up supervision  OT Frequency: Min 2X/week   Barriers to D/C:            Co-evaluation PT/OT/SLP Co-Evaluation/Treatment: Yes Reason for Co-Treatment: For patient/therapist safety PT  goals addressed during session: Mobility/safety with mobility OT goals addressed during session: ADL's and self-care      End of Session    Activity Tolerance: Patient tolerated treatment well Patient left: in bed;with call bell/phone within reach;with family/visitor present   Time: [id number] OT Time Calculation (min): 30 min Charges:  OT General Charges $OT Visit: 1 Procedure OT Evaluation $OT Eval Low Complexity: 1 Procedure G-Codes:    Mirage Pfefferkorn 2016-03-27, 1:59 PM  Lesle Chris, OTR/L [phone] 27-Mar-2016

## 2016-03-04 NOTE — Evaluation (Signed)
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 H/o Multiple L knee surgeries and chronic infection  Clinical Impression  Pt admitted as above and presenting with functional mobility limitations 2* post op pain and absent L LE.  Pt states he has had minimal use of L LE for a "good while" and plans dc home with family assist and HHPT follow up.    Follow Up Recommendations Home health PT    Equipment Recommendations  None recommended by PT    Recommendations for Other Services OT consult     Precautions / Restrictions Precautions Precautions: Fall Restrictions Weight Bearing Restrictions: Yes LLE Weight Bearing: Non weight bearing      Mobility  Bed Mobility Overal bed mobility: Needs Assistance Bed Mobility: Supine to Sit           General bed mobility comments: Increased time, HOB elevated, min assist to complete transition to EOB  Transfers Overall transfer level: Needs assistance Equipment used: Rolling walker (2 wheeled) Transfers: Sit to/from Stand Sit to Stand: Min assist;+2 safety/equipment         General transfer comment: for safety.    Ambulation/Gait Ambulation/Gait assistance: Min assist;+2 safety/equipment Ambulation Distance (Feet): 12 Feet Assistive device: Rolling walker (2 wheeled) Gait Pattern/deviations: Shuffle;Trunk flexed Gait velocity: decr Gait velocity interpretation: Below normal speed for age/gender General Gait Details: Min assist for balance and RW control  Stairs            Wheelchair Mobility    Modified Rankin (Stroke Patients Only)       Balance                                             Pertinent Vitals/Pain Pain Assessment: 0-10 Pain Score: 8  Faces Pain Scale: Hurts even more Pain Location: phantom pain, L knee Pain Descriptors / Indicators:  Aching;Sore Pain Intervention(s): Limited activity within patient's tolerance;Monitored during session;Premedicated before session;Patient requesting pain meds-RN notified;Ice applied    Home Living Family/patient expects to be discharged to:: Private residence Living Arrangements: Spouse/significant other;Children Available Help at Discharge: Family Type of Home: House Home Access: Ramped entrance     [place]: One level Home Equipment: [place] - single point;Walker - 2 wheels;Bedside commode;Shower seat;Wheelchair - manual      Prior Function Level of Independence: Independent with assistive device(s)         Comments: used cane/walker     Hand Dominance   Dominant Hand: Right    Extremity/Trunk Assessment   Upper Extremity Assessment: Overall WFL for tasks assessed           Lower Extremity Assessment: LLE deficits/detail   LLE Deficits / Details: AKA 03/03/16  Cervical / Trunk Assessment: Normal  Communication   Communication: No difficulties  Cognition Arousal/Alertness: Awake/alert Behavior During Therapy: WFL for tasks assessed/performed Overall Cognitive Status: Within Functional Limits for tasks assessed                      General Comments      Exercises     Assessment/Plan    PT Assessment Patient needs continued PT services  PT Problem List Decreased strength;Decreased range of motion;Decreased activity tolerance;Decreased mobility;Decreased knowledge of use of DME;Pain;Obesity  PT Treatment Interventions DME instruction;Gait training;Functional mobility training;Therapeutic activities;Therapeutic exercise;Patient/family education    PT Goals (Current goals can be found in the Care Plan section)  Acute Rehab PT Goals Patient Stated Goal: home; less pain PT Goal Formulation: With patient Time For Goal Achievement: 03/11/16 Potential to Achieve Goals: Good    Frequency Min 5X/week   Barriers to discharge         Co-evaluation PT/OT/SLP Co-Evaluation/Treatment: Yes Reason for Co-Treatment: For patient/therapist safety PT goals addressed during session: Mobility/safety with mobility OT goals addressed during session: ADL's and self-care       End of Session Equipment Utilized During Treatment: Gait belt Activity Tolerance: Patient tolerated treatment well;Patient limited by fatigue;Patient limited by pain Patient left: in bed;with call bell/phone within reach;with family/visitor present Nurse Communication: Mobility status         Time: [id number] PT Time Calculation (min) (ACUTE ONLY): 33 min   Charges:   PT Evaluation $PT Eval Low Complexity: 1 Procedure     PT G Codes:        Darlena Koval 03-23-16, 2:20 PM

## 2016-03-04 NOTE — Progress Notes (Signed)
Subjective: 1 Day Post-Op Procedure(s) (LRB): LEFT ABOVE KNEE AMPUTATION (Left) Patient reports pain as mild. No complaints.   Objective: Vital signs in last 24 hours: Temp:  [97.7 F (36.5 C)-99.1 F (37.3 C)] 98.5 F (36.9 C) (12/08 0552) Pulse Rate:  [51-75] 67 (12/08 0826) Resp:  [12-19] 16 (12/08 0552) BP: (112-142)/(57-73) 124/57 (12/08 0826) SpO2:  [97 %-100 %] 97 % (12/08 0552)  Intake/Output from previous day: 12/07 0701 - 12/08 0700 In: 3467.5 [P.O.:480; I.V.:2632.5; IV Piggyback:355] Out: 3125 [Urine:2925; Blood:200] Intake/Output this shift: No intake/output data recorded.   Recent Labs  03/04/16 0739  HGB 9.2*    Recent Labs  03/04/16 0739  WBC 7.1  RBC 3.24*  HCT 27.5*  PLT 128*    Recent Labs  03/04/16 0412  NA 136  K 3.6  CL 103  CO2 26  BUN 16  CREATININE 1.03  GLUCOSE 104*  CALCIUM 9.1   No results for input(s): LABPT, INR in the last 72 hours.   Left AKA: Incision: dressing C/D/I Compartment soft  Assessment/Plan: 1 Day Post-Op Procedure(s) (LRB): LEFT ABOVE KNEE AMPUTATION (Left) Up with therapy  Most likely will be in hospital through weekend. Monitor for symptoms of anemia.  Jannelly Bergren Azzarello 03/04/2016, 9:59 AM

## 2016-03-04 NOTE — Op Note (Signed)
NAME:  Steven Charles, Steven Charles NO.:  [id number]  MEDICAL RECORD NO.:  [id number]  LOCATION:  PERIO                        FACILITY:  [facility]  PHYSICIAN:  Lind Guest. 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  PROCEDURE:  Left above-knee amputation.  SURGEON:  Lind Guest. Ninfa Linden, M.D.  ASSISTANT:  Erskine Emery, PA-C.  ANESTHESIA: 1. Sciatic and femoral block, left lower extremity. 2. General.  TOURNIQUET TIME:  Less than 1 hour.  BLOOD LOSS:  200 mL to less.  COMPLICATIONS:  None.  ANTIBIOTICS:  2 g of IV Ancef.  INDICATIONS:  Mr. Ramsour is a 70 year old gentleman who in some time before 2007, sustained significant trauma to his left lower extremity. At that time, he had undergone open reduction and internal fixation of a distal femur fracture as well as the patella fracture.  He had retained hardware in his leg.  It eventually developed posttraumatic arthritis and another orthopedic surgeon in town took him to the operating room in 2007 and removed the hardware and placed a left total knee arthroplasty. He had a long course after this with recovery.  My contact came with him only this year.  In May of this year, he developed an infection involving his left total knee area.  He underwent irrigation and debridement by one of my partners.  Then, the original partner who had performed the total knee arthroplasty, took him to the operating room in June and performed irrigation and debridement, polyethylene liner exchange and placed an antibiotic beads in the knee.  By July of this year, he developed worsening infection.  The partner was out of town for 2 weeks, and I took over the case.  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  His infectious parameters stayed elevated in light of long-term IV antibiotics, he was not clearing the infection. We took him to the operating room in July and removed all the hardware from his knee except for residual pin in the patella that we could not find.  We placed the antibiotic spacer and kept him on antibiotics, followed closely by the Infectious Disease Service.  About a month ago, he underwent a second irrigation and debridement, removal of previous antibiotic spacer and a new antibiotic spacer.  We also removed the final retained pin in his patella.  He still has been unable to clear the infection completely and his infectious parameters, labs are stayed elevated.  An MRI was obtained of his knee and showed evidence of osteomyelitis in the distal femur, the proximal tibia and around the patella.  Given that and his inability to clear the infection at this point, an amputation has been recommended.  He wishes to proceed with this now given the findings of the MRI and his continued inability to fight this infection.  PROCEDURE DESCRIPTION:  After informed consent was obtained, appropriate left leg was marked.  Anesthesia obtained a sciatic and femoral block in the holding room.  He was then brought to the operating room and placed supine on the operating table, and general anesthesia was then obtained as well.  His left leg was prepped and draped from the abdomen down to past the knee with DuraPrep and sterile drapes including a sterile stockinette.  Time-out was called and he was identified as correct patient and correct left leg.  We then made an incision above the knee in the midfemur and dissected proximally and distally down to the bone. I cleaned the area of bone where I did not  feel there was evidence of osteomyelitis based on the appearance of the bone as well as the MRI findings.  We used an oscillating saw to cut through the femur.  We then used an amputation blade to remove the leg.  We were able to then re- arrange soft tissue, we did not find any other evidence of infection. We irrigated the leg with normal saline solution.  We identified the neurovascular bundle and cut the sciatic nerve, so retracted higher up in the leg.  We used 2-0 silk ties to tie off the vessels.  We then let the tourniquet down and hemostasis was obtained with electrocautery.  We then closed the deep tissue with a flap with 0 Vicryl followed by 2-0 Vicryl in the subcutaneous tissue, interrupted 2-0 nylon on the skin. Well-padded sterile dressing was applied.  He was awakened, extubated and taken to the recovery room in stable condition.  All final counts were correct.  There were no complications noted.  Of note, Erskine Emery, PA-C, assistant, who was present from the beginning and end of the case and was essential for helping out with this case given the patient's size as well.     Lind Guest. Ninfa Linden, M.D.     CYB/MEDQ  D:  03/03/2016  T:  03/04/2016  Job:  [id number]

## 2016-03-05 LAB — BASIC METABOLIC PANEL
Anion gap: 7 (ref 5–15)
BUN: 15 mg/dL (ref 6–20)
CHLORIDE: 101 mmol/L (ref 101–111)
CO2: 27 mmol/L (ref 22–32)
CREATININE: 1.04 mg/dL (ref 0.61–1.24)
Calcium: 9.4 mg/dL (ref 8.9–10.3)
Glucose, Bld: 123 mg/dL — ABNORMAL HIGH (ref 65–99)
POTASSIUM: 4 mmol/L (ref 3.5–5.1)
SODIUM: 135 mmol/L (ref 135–145)

## 2016-03-05 LAB — GLUCOSE, CAPILLARY
GLUCOSE-CAPILLARY: 115 mg/dL — AB (ref 65–99)
GLUCOSE-CAPILLARY: 133 mg/dL — AB (ref 65–99)
Glucose-Capillary: 126 mg/dL — ABNORMAL HIGH (ref 65–99)
Glucose-Capillary: 126 mg/dL — ABNORMAL HIGH (ref 65–99)

## 2016-03-05 MED ORDER — ALUM & MAG HYDROXIDE-SIMETH 200-200-20 MG/5ML PO SUSP
30.0000 mL | ORAL | Status: DC | PRN
  Administered 2016-03-05: 30 mL via ORAL
  Filled 2016-03-05: qty 30

## 2016-03-05 NOTE — Progress Notes (Signed)
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 H/o Multiple L knee surgeries and chronic infection    PT Comments    Pt progressing well with mobility and with marked improvement in activity tolerance.  Follow Up Recommendations  Home health PT     Equipment Recommendations  None recommended by PT    Recommendations for Other Services OT consult     Precautions / Restrictions Precautions Precautions: Fall Restrictions Weight Bearing Restrictions: Yes LLE Weight Bearing: Non weight bearing    Mobility  Bed Mobility Overal bed mobility: Needs Assistance Bed Mobility: Supine to Sit       Sit to supine: Min guard   General bed mobility comments: increased time and min guard to initially balance at EOB  Transfers Overall transfer level: Needs assistance Equipment used: Rolling walker (2 wheeled) Transfers: Sit to/from Stand Sit to Stand: Min assist;From elevated surface         General transfer comment: for safety.    Ambulation/Gait Ambulation/Gait assistance: Min assist Ambulation Distance (Feet): 40 Feet (and 15' into bathroom) Assistive device: Rolling walker (2 wheeled) Gait Pattern/deviations: Shuffle;Trunk flexed Gait velocity: decr Gait velocity interpretation: Below normal speed for age/gender General Gait Details: Min assist for balance and RW control   Stairs            Wheelchair Mobility    Modified Rankin (Stroke Patients Only)       Balance                                    Cognition Arousal/Alertness: Awake/alert Behavior During Therapy: WFL for tasks assessed/performed Overall Cognitive Status: Within Functional Limits for tasks assessed                      Exercises      General Comments        Pertinent Vitals/Pain Pain Assessment: 0-10 Pain  Score: 6  Pain Location: phantom pain, L knee Pain Descriptors / Indicators: Aching;Sore Pain Intervention(s): Limited activity within patient's tolerance;Monitored during session;Premedicated before session    Home Living                      Prior Function            PT Goals (current goals can now be found in the care plan section) Acute Rehab PT Goals Patient Stated Goal: home; less pain PT Goal Formulation: With patient Time For Goal Achievement: 03/11/16 Potential to Achieve Goals: Good Progress towards PT goals: Progressing toward goals    Frequency    Min 5X/week      PT Plan Current plan remains appropriate    Co-evaluation             End of Session Equipment Utilized During Treatment: Gait belt Activity Tolerance: Patient tolerated treatment well Patient left: Other (comment) (bathroom)     Time: [id number] PT Time Calculation (min) (ACUTE ONLY): 26 min  Charges:  $Gait Training: 23-37 mins                    G Codes:      Swathi Dauphin 11-Mar-2016, 1:28 PM

## 2016-03-06 LAB — GLUCOSE, CAPILLARY: GLUCOSE-CAPILLARY: 113 mg/dL — AB (ref 65–99)

## 2016-03-06 LAB — BASIC METABOLIC PANEL
ANION GAP: 8 (ref 5–15)
BUN: 15 mg/dL (ref 6–20)
CO2: 26 mmol/L (ref 22–32)
Calcium: 9.2 mg/dL (ref 8.9–10.3)
Chloride: 99 mmol/L — ABNORMAL LOW (ref 101–111)
Creatinine, Ser: 0.89 mg/dL (ref 0.61–1.24)
GFR calc Af Amer: >60 mL/min (ref >60)
Glucose, Bld: 136 mg/dL — ABNORMAL HIGH (ref 65–99)
POTASSIUM: 3.5 mmol/L (ref 3.5–5.1)
SODIUM: 133 mmol/L — AB (ref 135–145)

## 2016-03-06 MED ORDER — ONDANSETRON HCL 4 MG PO TABS: 4.0000 mg | ORAL_TABLET | Freq: Three times a day (TID) | ORAL | 0 refills | Status: DC | PRN

## 2016-03-06 MED ORDER — HYDROCODONE-ACETAMINOPHEN 5-325 MG PO TABS: 1.0000 | ORAL_TABLET | Freq: Four times a day (QID) | ORAL | 0 refills | Status: DC | PRN

## 2016-03-06 MED ORDER — PROMETHAZINE HCL 25 MG PO TABS: 25.0000 mg | ORAL_TABLET | Freq: Four times a day (QID) | ORAL | 1 refills | Status: DC | PRN

## 2016-03-06 MED ORDER — METHOCARBAMOL 750 MG PO TABS: 750.0000 mg | ORAL_TABLET | Freq: Two times a day (BID) | ORAL | 0 refills | Status: DC | PRN

## 2016-03-06 MED ORDER — SENNOSIDES-DOCUSATE SODIUM 8.6-50 MG PO TABS: 1.0000 | ORAL_TABLET | Freq: Every evening | ORAL | 1 refills | Status: DC | PRN

## 2016-03-06 NOTE — Care Management Important Message (Signed)
Important Message  Patient Details  Name: Steven Charles MRN: [id number] Date of Birth: 11/06/1945   Medicare Important Message Given:  Yes    Erenest Rasher, RN 03/06/2016, 12:58 PM

## 2016-03-06 NOTE — Care Management Note (Signed)
Case Management Note  Patient Details  Name: Steven Charles MRN: [id number] Date of Birth: 08-01-1945  Subjective/Objective:  Osteomyelitis of femur, s/p AKA                 Action/Plan: Discharge Planning: AVS reviewed: NCM spoke to pt and has RW and wheelchair at home. Active with [facility] for [facility] PT.   PCP Biagio Borg MD  Expected Discharge Date:  03/06/16               Expected Discharge Plan:  [place]  In-House Referral:  NA  Discharge planning Services  CM Consult  Post Acute Care Choice:  Home Health, Resumption of Svcs/PTA Provider Choice offered to:  Patient  DME Arranged:  N/A DME Agency:  NA  HH Arranged:  RN, PT [place] Agency:  [facility]  Status of Service:  In process, will continue to follow  If discussed at Long Length of Stay Meetings, dates discussed:    Additional Comments:  Erenest Rasher, RN 03/06/2016, 12:56 PM
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
## 2016-03-06 NOTE — Progress Notes (Signed)
Physical Therapy Treatment Patient Details Name: Steven Charles MRN: [id number] DOB: 11-27-1945 Today's Date: 03/06/2016    History of Present Illness Pt is s/p L AKA due to osteomylelitis. H/o Multiple L knee surgeries and chronic infection    PT Comments    POD # 3 L AKA  Pt in good spirits and having some phantom pain.  Assisted with transfers and amb with walker then returned to room to perform and educate on AKA TE's.  Handouts given.  Instructed on butt squeezes, L hip "press downs" with towel roll under L thigh, L hip ABD and only a few L hip flex.  Instructed to perform TE's twice a day.  Instructed to lay on his stomach (if he can) twice a day to stretch hip flexors.  Instructed on a few standing TE's of L hip extension and L hip ABD.  Educated on heeling process and "shrinking process" before he can be molded for a prosthesis.    Follow Up Recommendations  Home health PT     Equipment Recommendations  None recommended by PT    Recommendations for Other Services       Precautions / Restrictions Precautions Precautions: Fall Precaution Comments: new L AKA Restrictions Weight Bearing Restrictions: Yes LLE Weight Bearing: Non weight bearing Other Position/Activity Restrictions: L LE    Mobility  Bed Mobility               General bed mobility comments: OOB in recliner  Transfers Overall transfer level: Needs assistance Equipment used: Rolling walker (2 wheeled) Transfers: Sit to/from [facility] Sit to Stand: Supervision;Min guard Stand pivot transfers: Supervision;Min guard       General transfer comment: <25% VC's on safety with turns and reaching back prior to sit to control decend  Ambulation/Gait Ambulation/Gait assistance: Min guard;Min assist ("hopping") Ambulation Distance (Feet): 47 Feet Assistive device: Rolling walker (2 wheeled) Gait Pattern/deviations: Staggering left Gait velocity: decr   General Gait Details: Min assist  for balance and RW control.  VC's to avoid advancing walker too far to front because pt was trying to "get there quicker".    Stairs            Wheelchair Mobility    Modified Rankin (Stroke Patients Only)       Balance                                    Cognition Arousal/Alertness: Awake/alert Behavior During Therapy: WFL for tasks assessed/performed Overall Cognitive Status: Within Functional Limits for tasks assessed                      Exercises  L hip EXT, ABD, butt squeezes, flex in supine following HEP.      General Comments        Pertinent Vitals/Pain Pain Assessment: 0-10 Pain Score: 7  Pain Location: phantom pain, L knee Pain Descriptors / Indicators: Discomfort;Operative site guarding;Tender Pain Intervention(s): Monitored during session;Repositioned    Home Living                      Prior Function            PT Goals (current goals can now be found in the care plan section) Progress towards PT goals: Progressing toward goals    Frequency    Min 5X/week      PT Plan  Current plan remains appropriate    Co-evaluation             End of Session Equipment Utilized During Treatment: Gait belt Activity Tolerance: Patient tolerated treatment well Patient left: in chair;with call bell/phone within reach;with family/visitor present     Time: [id number] PT Time Calculation (min) (ACUTE ONLY): 40 min  Charges:  $Gait Training: 8-22 mins $Therapeutic Exercise: 8-22 mins $Therapeutic Activity: 8-22 mins                    G Codes:      Rica Koyanagi  PTA [facility]  Acute  Rehab Pager      [phone]

## 2016-03-07 ENCOUNTER — Other Ambulatory Visit: Payer: Self-pay | Admitting: *Deleted

## 2016-03-07 LAB — TYPE AND SCREEN
BLOOD PRODUCT EXPIRATION DATE: 201712262359
Blood Product Expiration Date: 201801062359
UNIT TYPE AND RH: 600
UNIT TYPE AND RH: 9500

## 2016-03-07 NOTE — Patient Outreach (Signed)
[facility]) Care Management  03/07/2016  [name] November 10, 1945 [id number]   [name]. Charles is a 70 year old patient who was first referred to [facility] after hospital admission 08/28/15-08/31/15 for I&D and revision of the tibial component of infected left total knee arthroplasty. [name] had placement of antibiotic beads, wound VAC, and a PICC line for IV antibiotics. He was newly diagnosed with DM Type II during this admission.  [name] was discharged to home with home health services but had to return to the hospital for surgical revision and had a short term SNF stay for rehab. He was discharged to home again and most recently admitted on 03/03/16 for osteomyelitis of the femur requiring left above the knee amputation.   [name] was discharged to home yesterday with home health physical therapy and nursing via [facility]. He is receiving IV antibiotics and wound care. Patient was recently discharged from hospital and all medications have been reviewed. [name] is scheduled to see Dr. Jean Rosenthal [phone]) on 03/17/16 for wound check and post hospital/surgical follow up.   Plan: I will reach out to [name] weekly x at least 4 weeks and will see him face to face at some point during that 4 week period for transition of care services.  He has requested that I make that visit later in the 4 week period as he has so many other practitioners making visits to his home right now.    [facility]  [phone]

## 2016-03-08 DIAGNOSIS — Z7982 Long term (current) use of aspirin: Secondary | ICD-10-CM | POA: Diagnosis not present

## 2016-03-08 DIAGNOSIS — Z4781 Encounter for orthopedic aftercare following surgical amputation: Secondary | ICD-10-CM | POA: Diagnosis not present

## 2016-03-08 DIAGNOSIS — I2510 Atherosclerotic heart disease of native coronary artery without angina pectoris: Secondary | ICD-10-CM | POA: Diagnosis not present

## 2016-03-08 DIAGNOSIS — I509 Heart failure, unspecified: Secondary | ICD-10-CM | POA: Diagnosis not present

## 2016-03-08 DIAGNOSIS — E785 Hyperlipidemia, unspecified: Secondary | ICD-10-CM | POA: Diagnosis not present

## 2016-03-08 DIAGNOSIS — I110 Hypertensive heart disease with heart failure: Secondary | ICD-10-CM | POA: Diagnosis not present

## 2016-03-08 DIAGNOSIS — R7303 Prediabetes: Secondary | ICD-10-CM | POA: Diagnosis not present

## 2016-03-08 DIAGNOSIS — I714 Abdominal aortic aneurysm, without rupture: Secondary | ICD-10-CM | POA: Diagnosis not present

## 2016-03-08 DIAGNOSIS — Z89612 Acquired absence of left leg above knee: Secondary | ICD-10-CM | POA: Diagnosis not present

## 2016-03-10 DIAGNOSIS — Z4781 Encounter for orthopedic aftercare following surgical amputation: Secondary | ICD-10-CM | POA: Diagnosis not present

## 2016-03-10 DIAGNOSIS — R7303 Prediabetes: Secondary | ICD-10-CM | POA: Diagnosis not present

## 2016-03-10 DIAGNOSIS — I509 Heart failure, unspecified: Secondary | ICD-10-CM | POA: Diagnosis not present

## 2016-03-10 DIAGNOSIS — Z89612 Acquired absence of left leg above knee: Secondary | ICD-10-CM | POA: Diagnosis not present

## 2016-03-10 DIAGNOSIS — E785 Hyperlipidemia, unspecified: Secondary | ICD-10-CM | POA: Diagnosis not present

## 2016-03-10 DIAGNOSIS — I110 Hypertensive heart disease with heart failure: Secondary | ICD-10-CM | POA: Diagnosis not present

## 2016-03-10 DIAGNOSIS — Z7982 Long term (current) use of aspirin: Secondary | ICD-10-CM | POA: Diagnosis not present

## 2016-03-10 DIAGNOSIS — I714 Abdominal aortic aneurysm, without rupture: Secondary | ICD-10-CM | POA: Diagnosis not present

## 2016-03-10 DIAGNOSIS — I2510 Atherosclerotic heart disease of native coronary artery without angina pectoris: Secondary | ICD-10-CM | POA: Diagnosis not present

## 2016-03-11 DIAGNOSIS — Z4781 Encounter for orthopedic aftercare following surgical amputation: Secondary | ICD-10-CM | POA: Diagnosis not present

## 2016-03-15 DIAGNOSIS — I110 Hypertensive heart disease with heart failure: Secondary | ICD-10-CM | POA: Diagnosis not present

## 2016-03-15 DIAGNOSIS — I714 Abdominal aortic aneurysm, without rupture: Secondary | ICD-10-CM | POA: Diagnosis not present

## 2016-03-15 DIAGNOSIS — I509 Heart failure, unspecified: Secondary | ICD-10-CM | POA: Diagnosis not present

## 2016-03-15 DIAGNOSIS — Z7982 Long term (current) use of aspirin: Secondary | ICD-10-CM | POA: Diagnosis not present

## 2016-03-15 DIAGNOSIS — Z4781 Encounter for orthopedic aftercare following surgical amputation: Secondary | ICD-10-CM | POA: Diagnosis not present

## 2016-03-15 DIAGNOSIS — R7303 Prediabetes: Secondary | ICD-10-CM | POA: Diagnosis not present

## 2016-03-15 DIAGNOSIS — Z89612 Acquired absence of left leg above knee: Secondary | ICD-10-CM | POA: Diagnosis not present

## 2016-03-15 DIAGNOSIS — E785 Hyperlipidemia, unspecified: Secondary | ICD-10-CM | POA: Diagnosis not present

## 2016-03-15 DIAGNOSIS — I2510 Atherosclerotic heart disease of native coronary artery without angina pectoris: Secondary | ICD-10-CM | POA: Diagnosis not present

## 2016-03-17 ENCOUNTER — Ambulatory Visit (INDEPENDENT_AMBULATORY_CARE_PROVIDER_SITE_OTHER): Payer: Medicare Other | Admitting: Physician Assistant

## 2016-03-17 DIAGNOSIS — Z89612 Acquired absence of left leg above knee: Secondary | ICD-10-CM
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
## 2016-03-18 ENCOUNTER — Other Ambulatory Visit: Payer: Self-pay | Admitting: *Deleted

## 2016-03-18 DIAGNOSIS — E785 Hyperlipidemia, unspecified: Secondary | ICD-10-CM | POA: Diagnosis not present

## 2016-03-18 DIAGNOSIS — R7303 Prediabetes: Secondary | ICD-10-CM | POA: Diagnosis not present

## 2016-03-18 DIAGNOSIS — I509 Heart failure, unspecified: Secondary | ICD-10-CM | POA: Diagnosis not present

## 2016-03-18 DIAGNOSIS — I110 Hypertensive heart disease with heart failure: Secondary | ICD-10-CM | POA: Diagnosis not present

## 2016-03-18 DIAGNOSIS — Z7982 Long term (current) use of aspirin: Secondary | ICD-10-CM | POA: Diagnosis not present

## 2016-03-18 DIAGNOSIS — Z89612 Acquired absence of left leg above knee: Secondary | ICD-10-CM | POA: Diagnosis not present

## 2016-03-18 DIAGNOSIS — Z4781 Encounter for orthopedic aftercare following surgical amputation: Secondary | ICD-10-CM | POA: Diagnosis not present

## 2016-03-18 DIAGNOSIS — I2510 Atherosclerotic heart disease of native coronary artery without angina pectoris: Secondary | ICD-10-CM | POA: Diagnosis not present

## 2016-03-18 DIAGNOSIS — I714 Abdominal aortic aneurysm, without rupture: Secondary | ICD-10-CM | POA: Diagnosis not present

## 2016-03-18 NOTE — Patient Outreach (Signed)
[facility]) Care Management  03/18/2016  Steven Charles Jul 19, 1945 [id number]  Steven Charles is a 70 year old patient who was first referred to [facility] after hospital admission 08/28/15-08/31/15 for I&D and revision of the tibial component of infected left total knee arthroplasty. Steven Charles had placement of antibiotic beads, wound VAC, and a PICC line for IV antibiotics. He was newly diagnosed with DM Type II during this admission.   Steven Charles was discharged to home with home health services but had to return to the hospital for surgical revision and had a short term SNF stay for rehab. He was discharged to home again and most recently admitted on 03/03/16 for osteomyelitis of the femur requiring left above the knee amputation.   Steven Charles was discharged to home 03/06/16 with home health physical therapy and nursing via [facility]. He is receiving IV antibiotics and wound care.   I was unable to speak with Steven Charles or his wife by phone today. Please see notes below for post op visits and progress thus far:   Steven Charles followed up for his first post op appointment at [facility] yesterday (03/17/16). It was noted by the P.A.that left stump had "blisters over the anterior thigh just proximal to the incision that have unroofed. A signs of gross infection. The surgical incision site is well approximated with interrupted nylon sutures..."  Wound care instructions to the patient/spouse were as follows:   Xeroform over the blisters that he is anterior thigh he will leave the dressing intact for the next 2-3 days Tegaderm was placed over it so he can shower is to wash the stump with an antibacterial soap. Sutures were left in today we'll see him back in 1 week and remove the sutures at that time  Return in about 4 weeks (around 04/14/2016) for wound check.   Plan:   I will follow up with Steven Charles by phone next week for continued transition of care  assessments.   Steven Charles will see Dr. Jean Rosenthal in the office on 03/24/16 for post surgical follow up appointment. His wife will provide transportation.    [facility]  [phone]

## 2016-03-22 DIAGNOSIS — I2510 Atherosclerotic heart disease of native coronary artery without angina pectoris: Secondary | ICD-10-CM | POA: Diagnosis not present

## 2016-03-22 DIAGNOSIS — I714 Abdominal aortic aneurysm, without rupture: Secondary | ICD-10-CM | POA: Diagnosis not present

## 2016-03-22 DIAGNOSIS — Z4781 Encounter for orthopedic aftercare following surgical amputation: Secondary | ICD-10-CM | POA: Diagnosis not present

## 2016-03-22 DIAGNOSIS — Z89612 Acquired absence of left leg above knee: Secondary | ICD-10-CM | POA: Diagnosis not present

## 2016-03-22 DIAGNOSIS — I509 Heart failure, unspecified: Secondary | ICD-10-CM | POA: Diagnosis not present

## 2016-03-22 DIAGNOSIS — Z7982 Long term (current) use of aspirin: Secondary | ICD-10-CM | POA: Diagnosis not present

## 2016-03-22 DIAGNOSIS — R7303 Prediabetes: Secondary | ICD-10-CM | POA: Diagnosis not present

## 2016-03-22 DIAGNOSIS — E785 Hyperlipidemia, unspecified: Secondary | ICD-10-CM | POA: Diagnosis not present

## 2016-03-22 DIAGNOSIS — I110 Hypertensive heart disease with heart failure: Secondary | ICD-10-CM | POA: Diagnosis not present

## 2016-03-23 ENCOUNTER — Telehealth (INDEPENDENT_AMBULATORY_CARE_PROVIDER_SITE_OTHER): Payer: Self-pay | Admitting: *Deleted

## 2016-03-23 NOTE — Telephone Encounter (Signed)
Sarah from [facility] called to get orders to extend visits for 2 more weeks.

## 2016-03-23 NOTE — Telephone Encounter (Signed)
I called spoke with Judson Roch from [facility] to give extension of orders for two more weeks, patient being seen in office tomorrow by Artis Delay.

## 2016-03-24 ENCOUNTER — Ambulatory Visit (INDEPENDENT_AMBULATORY_CARE_PROVIDER_SITE_OTHER): Payer: Medicare Other | Admitting: Orthopaedic Surgery

## 2016-03-24 DIAGNOSIS — Z89612 Acquired absence of left leg above knee: Secondary | ICD-10-CM

## 2016-03-24 NOTE — Progress Notes (Signed)
The patient is status post a left above-knee amputation. He is 3 weeks out from surgery and doing well  On examination of his left above-knee and dictation stump the suture line looks good arm of the sutures and place Steri-Strips.  At this point they will get a follow-up follow-up with [facility] and start the process for prosthetic fitting. I'll see him back in a month to see how is doing overall.

## 2016-03-25 ENCOUNTER — Encounter: Payer: Self-pay | Admitting: *Deleted

## 2016-03-25 ENCOUNTER — Other Ambulatory Visit: Payer: Self-pay | Admitting: *Deleted

## 2016-03-25 NOTE — Patient Outreach (Signed)
[facility]) Care Management  03/25/2016  CLEVLAND KOZUB 1946/01/18 [id number]  [name]. Rudisill is a 70 year old patient who was first referred to [facility] after hospital admission 08/28/15-08/31/15 for I&D and revision of the tibial component of infected left total knee arthroplasty. Mr. Lemming had placement of antibiotic beads, wound VAC, and a PICC line for IV antibiotics. He was newly diagnosed with DM Type II during this admission.   Mr. Scheumann was discharged to home with home health services but had to return to the hospital for surgical revision and had a short term SNF stay for rehab. He was discharged to home again and most recently admitted on 03/03/16 for osteomyelitis of the femur requiring left above the knee amputation.   Mr. Woodman was discharged to home 03/06/16 with home health physical therapy and nursing via [facility]. He is receiving IV antibiotics and wound care.   I called Mr. Sargeant today to follow up on his progress. He said he had a good appointment with Dr. Ninfa Linden yesterday and is to call the prosthetist to set up an appointment for next week. He has Mike's number and is going to call this afternoon.   Mr. Criscione is well established with his physicians and home care providers. He is attending all scheduled appointments and is well connected to community resources and has an excellent support system. At this time, Mr. Aronoff wife in particular is feeling overwhelmed with the number of caregivers and visits and calls being received. Therefore, I have provided my contact information to Mr. Grady and told him we are available to provide assistance and support at any time should he be in need. I will close Mr. Hrabak case at this time.   Plan: Case Closure.    [facility] [facility]  [phone]

## 2016-03-31 ENCOUNTER — Other Ambulatory Visit: Payer: Self-pay | Admitting: Internal Medicine

## 2016-03-31 DIAGNOSIS — R7303 Prediabetes: Secondary | ICD-10-CM | POA: Diagnosis not present

## 2016-03-31 DIAGNOSIS — I509 Heart failure, unspecified: Secondary | ICD-10-CM | POA: Diagnosis not present

## 2016-03-31 DIAGNOSIS — Z89612 Acquired absence of left leg above knee: Secondary | ICD-10-CM | POA: Diagnosis not present

## 2016-03-31 DIAGNOSIS — Z4781 Encounter for orthopedic aftercare following surgical amputation: Secondary | ICD-10-CM | POA: Diagnosis not present

## 2016-03-31 DIAGNOSIS — E785 Hyperlipidemia, unspecified: Secondary | ICD-10-CM | POA: Diagnosis not present

## 2016-03-31 DIAGNOSIS — I714 Abdominal aortic aneurysm, without rupture: Secondary | ICD-10-CM | POA: Diagnosis not present

## 2016-03-31 DIAGNOSIS — I2510 Atherosclerotic heart disease of native coronary artery without angina pectoris: Secondary | ICD-10-CM | POA: Diagnosis not present

## 2016-03-31 DIAGNOSIS — I110 Hypertensive heart disease with heart failure: Secondary | ICD-10-CM | POA: Diagnosis not present

## 2016-03-31 DIAGNOSIS — Z7982 Long term (current) use of aspirin: Secondary | ICD-10-CM | POA: Diagnosis not present

## 2016-04-01 NOTE — Telephone Encounter (Signed)
Done hardcopy to Corinne  

## 2016-04-01 NOTE — Telephone Encounter (Signed)
faxed

## 2016-04-05 DIAGNOSIS — Z7982 Long term (current) use of aspirin: Secondary | ICD-10-CM | POA: Diagnosis not present

## 2016-04-05 DIAGNOSIS — Z4781 Encounter for orthopedic aftercare following surgical amputation: Secondary | ICD-10-CM | POA: Diagnosis not present

## 2016-04-05 DIAGNOSIS — R7303 Prediabetes: Secondary | ICD-10-CM | POA: Diagnosis not present

## 2016-04-05 DIAGNOSIS — I110 Hypertensive heart disease with heart failure: Secondary | ICD-10-CM | POA: Diagnosis not present

## 2016-04-05 DIAGNOSIS — I2510 Atherosclerotic heart disease of native coronary artery without angina pectoris: Secondary | ICD-10-CM | POA: Diagnosis not present

## 2016-04-05 DIAGNOSIS — Z89612 Acquired absence of left leg above knee: Secondary | ICD-10-CM | POA: Diagnosis not present

## 2016-04-05 DIAGNOSIS — E785 Hyperlipidemia, unspecified: Secondary | ICD-10-CM | POA: Diagnosis not present

## 2016-04-05 DIAGNOSIS — I714 Abdominal aortic aneurysm, without rupture: Secondary | ICD-10-CM | POA: Diagnosis not present

## 2016-04-05 DIAGNOSIS — I509 Heart failure, unspecified: Secondary | ICD-10-CM | POA: Diagnosis not present

## 2016-04-06 ENCOUNTER — Ambulatory Visit: Payer: Medicare Other | Admitting: Internal Medicine

## 2016-04-06 ENCOUNTER — Telehealth (INDEPENDENT_AMBULATORY_CARE_PROVIDER_SITE_OTHER): Payer: Self-pay

## 2016-04-06 DIAGNOSIS — T8454XD Infection and inflammatory reaction due to internal left knee prosthesis, subsequent encounter: Secondary | ICD-10-CM | POA: Diagnosis not present

## 2016-04-06 NOTE — Telephone Encounter (Signed)
See script.

## 2016-04-06 NOTE — Telephone Encounter (Signed)
Patient aware the [facility] Rx was faxed and I also mailed the original Rx to him

## 2016-04-06 NOTE — Telephone Encounter (Signed)
Patient called and left message that per Ronalee Belts with [employer] he needs Rx for prosthetic.

## 2016-04-21 ENCOUNTER — Other Ambulatory Visit: Payer: Self-pay | Admitting: Licensed Clinical Social Worker

## 2016-04-21 ENCOUNTER — Ambulatory Visit (INDEPENDENT_AMBULATORY_CARE_PROVIDER_SITE_OTHER): Payer: Medicare Other | Admitting: Orthopaedic Surgery

## 2016-04-21 ENCOUNTER — Encounter: Payer: Self-pay | Admitting: Licensed Clinical Social Worker

## 2016-04-21 DIAGNOSIS — Z89611 Acquired absence of right leg above knee: Secondary | ICD-10-CM | POA: Insufficient documentation

## 2016-04-21 MED ORDER — HYDROCODONE-ACETAMINOPHEN 5-325 MG PO TABS: 1.0000 | ORAL_TABLET | Freq: Three times a day (TID) | ORAL | 0 refills | Status: DC | PRN

## 2016-04-21 NOTE — Progress Notes (Signed)
The patient is now 6 weeks status post a left above-knee and dictation secondary chronic knee infection. He is doing well. He is going through the stump shrinker process and seeing the prosthetic specialist's afternoon. He has really no complaints.  On examination of his left AKA stump the wound is healed nicely. There isinfection. There is no redness.  At this point he'll continue the prosthetic fitting process. I'll see him back in a month to see how is doing overall. I did refill his his hydrocodone we talked about narcotics use encounter about this as well.

## 2016-04-21 NOTE — Patient Outreach (Signed)
Assessment:  CSW spoke via phone with client. CSW verified client identity. CSW and client spoke of client needs.  Client said he had his prescribed medications and is taking medications as prescribed. Client has support from his spouse.  Client sees his primary care doctor, Dr. Judi Cong,  as scheduled.  Client said he did not have any transport needs at present.  He said he had adequate food supply. He said he was sleeping adequately.  RN Janalyn Shy had recently discharged client from [facility]. CSW informed Riki on 04/21/16 that [place] had met his care plan goals with [facility] CSW services. CSW informed [place] on 04/21/16 that [place] would discharge Naseem Varden. Kroeze from [place] services on 04/21/16 since client had met client care plan goals with [facility] CSW services. [place] agreed to this plan. He was very appreciative of support he had received from [facility] and was appreciative of support received from [place]. He said he had recently undergone surgery and was recovering at home. He has all needed durable medical equipment. He said he has appointment with Dr. Rush Farmer, surgeon, today. Client said that client was planning to turn in paperwork today for prosthetic device  for client. He said he is trying to attend all scheduled client medical appointments. CSW congratulated [place] on meeting his care plan goals with [facility] CSW services.  Again, Yazeed was appreciative of support he has received from [facility] program staff in recent months.   Plan:  CSW is discharging Obrian Bulson. Janek from [place] services on 04/21/16 since client has met client's care plan goals with [facility] CSW services.  CSW to inform Josepha Pigg that [facility] discharged client on 04/21/16 from [place] services.  CSW to fax physician case closure letter to primary care doctor of client informing primary care doctor of client that [place] discharged client from [facility] CSW services on 04/21/16.  Norva Riffle.Laryssa Hassing MSW, LCSW Licensed Clinical  Social Worker [facility] Care Management [phone]

## 2016-05-07 DIAGNOSIS — T8454XD Infection and inflammatory reaction due to internal left knee prosthesis, subsequent encounter: Secondary | ICD-10-CM | POA: Diagnosis not present

## 2016-05-13 ENCOUNTER — Telehealth: Payer: Self-pay | Admitting: Internal Medicine

## 2016-05-16 NOTE — Telephone Encounter (Signed)
Last refill 04/01/16, last OV 02/17/16, please advise.

## 2016-05-17 ENCOUNTER — Telehealth: Payer: Self-pay | Admitting: Emergency Medicine

## 2016-05-17 MED ORDER — LORAZEPAM 1 MG PO TABS: 1.0000 mg | ORAL_TABLET | Freq: Three times a day (TID) | ORAL | 2 refills | Status: DC | PRN

## 2016-05-17 NOTE — Telephone Encounter (Signed)
Done hardcopy to anna 

## 2016-05-17 NOTE — Telephone Encounter (Signed)
Requesting refill of Lorazepam, last fill 04/01/16, last OV 02/17/16. Please advise.

## 2016-05-18 NOTE — Telephone Encounter (Signed)
RX faxed to local [facility] 

## 2016-05-23 ENCOUNTER — Ambulatory Visit (INDEPENDENT_AMBULATORY_CARE_PROVIDER_SITE_OTHER): Payer: Medicare Other | Admitting: Orthopaedic Surgery

## 2016-05-23 DIAGNOSIS — Z89612 Acquired absence of left leg above knee: Secondary | ICD-10-CM

## 2016-05-23 NOTE — Progress Notes (Signed)
The patient is now getting close to 3 months status post an above-knee and dictation of left leg. He is going to the prosthetic fitting process now and is doing well. His pain is minimal.  On examination his stump looks great. He's had a stump shrinker on is doing well left side. Overall his color and demeanor look good and he just looks healthy overall.  At this point a continued the prosthetic fitting process. I'll see him back in 2 months to see how is doing overall.

## 2016-05-25 NOTE — Telephone Encounter (Signed)
Faxed to [facility] 

## 2016-05-25 NOTE — Telephone Encounter (Signed)
FIND OUT WHERE

## 2016-05-25 NOTE — Telephone Encounter (Signed)
Steven Charles from [facility] is requesting a script for PT  Cb#: [phone]

## 2016-05-25 NOTE — Telephone Encounter (Signed)
Please advise 

## 2016-05-31 DIAGNOSIS — Z89612 Acquired absence of left leg above knee: Secondary | ICD-10-CM | POA: Diagnosis not present

## 2016-06-02 ENCOUNTER — Ambulatory Visit: Payer: Medicare Other | Attending: Orthopaedic Surgery | Admitting: Physical Therapy

## 2016-06-02 ENCOUNTER — Encounter: Payer: Self-pay | Admitting: Physical Therapy

## 2016-06-02 DIAGNOSIS — R2681 Unsteadiness on feet: Secondary | ICD-10-CM

## 2016-06-02 DIAGNOSIS — M6281 Muscle weakness (generalized): Secondary | ICD-10-CM | POA: Diagnosis not present

## 2016-06-02 DIAGNOSIS — M25652 Stiffness of left hip, not elsewhere classified: Secondary | ICD-10-CM | POA: Diagnosis not present

## 2016-06-02 DIAGNOSIS — R296 Repeated falls: Secondary | ICD-10-CM | POA: Diagnosis not present

## 2016-06-02 DIAGNOSIS — R2689 Other abnormalities of gait and mobility: Secondary | ICD-10-CM | POA: Diagnosis not present
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
## 2016-06-04 DIAGNOSIS — T8454XD Infection and inflammatory reaction due to internal left knee prosthesis, subsequent encounter: Secondary | ICD-10-CM | POA: Diagnosis not present

## 2016-06-13 ENCOUNTER — Encounter: Payer: Self-pay | Admitting: Physical Therapy

## 2016-06-13 ENCOUNTER — Ambulatory Visit: Payer: Medicare Other | Admitting: Physical Therapy

## 2016-06-13 DIAGNOSIS — M25652 Stiffness of left hip, not elsewhere classified: Secondary | ICD-10-CM | POA: Diagnosis not present

## 2016-06-13 DIAGNOSIS — M6281 Muscle weakness (generalized): Secondary | ICD-10-CM | POA: Diagnosis not present

## 2016-06-13 DIAGNOSIS — R296 Repeated falls: Secondary | ICD-10-CM | POA: Diagnosis not present

## 2016-06-13 DIAGNOSIS — R2681 Unsteadiness on feet: Secondary | ICD-10-CM

## 2016-06-13 DIAGNOSIS — R2689 Other abnormalities of gait and mobility: Secondary | ICD-10-CM | POA: Diagnosis not present
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
## 2016-06-13 NOTE — Patient Instructions (Addendum)
Put pants on prosthesis first. Feed prosthesis thru left pant leg. You may have to take the shoe off.  1. Turn liner inside out. Make sure label is on top so strap is in front of leg. Fold liner along seams and place circle against bottom of your leg with fold level. Roll liner up your leg. You may need to scoot forward and turn to your right to clear your leg from w/c bottom.  2. Tighten strap sitting so some rough and soft velcro overlap.  3. Stand up holding a walker or counter. Wiggle your leg down into prosthesis. Pull strap out of hole first then disconnect velcro & tighten as much as possible. 4. Pick right leg up 5-10 times putting weight on & off prosthesis. Readjust strap.  5. Step right leg out & back 5-10 times putting weight on & off prosthesis. Tighten strap again.   6. Every time you toilet, check strap only when standing before you pull your pants back up.    Wear prosthesis 3 hours 2 times per day.  After 5 days, if no skin issues or excessive tenderness, increase to 4 hours 2 times per day.  Every 5 days add 1 hour to wear if no issues. Do each exercise 1-2  times per day Do each exercise 5-10 repetitions Hold each exercise for 2-4 seconds to feel your location Progress from 2 hands, left hand, no hands.   AT [place] AND PLACE FEET EQUAL DISTANCE FROM THE MIDLINE.  USE TAPE ON FLOOR TO MARK THE MIDLINE POSITION. You also should try to feel with your limb pressure in socket.  You are trying to feel with limb what you used to feel with the bottom of your foot.  1. Side to Side Shift: Moving your hips only (not shoulders): move weight onto your left leg, HOLD/FEEL.  Move back to equal weight on each leg, HOLD/FEEL. Move weight onto your right leg, HOLD/FEEL. Move back to equal weight on each leg, HOLD/FEEL. Repeat. 2. Front to Back Shift: Moving your hips only (not shoulders): move your weight forward onto your toes, HOLD/FEEL. Move your weight back to  equal Flat Foot on both legs, HOLD/FEEL. Move your weight back onto your heels, HOLD/FEEL. Move your weight back to equal on both legs, HOLD/FEEL. Repeat. 3. Moving Cones / Cups: With equal weight on each leg: Hold on with one hand the first time, then progress to no hand supports. Move cups from one side of sink to the other. Place cups ~2" out of your reach, progress to 10" beyond reach. 4. Overhead/Upward Reaching: alternated reaching up to top cabinets or ceiling if no cabinets present. Keep equal weight on each leg. Start with one hand support on counter while other hand reaches and progress to no hand support with reaching. 5.   Looking Over Shoulders: With equal weight on each leg: alternate turning to look          over your shoulders with one hand support on counter as needed. Shift weight to             side looking, pull hip then shoulder then head/eyes around to look behind you. Start       with one hand support & progress to no hand support.

## 2016-06-15 ENCOUNTER — Encounter: Payer: Self-pay | Admitting: Physical Therapy

## 2016-06-15 ENCOUNTER — Ambulatory Visit: Payer: Medicare Other | Admitting: Physical Therapy

## 2016-06-15 DIAGNOSIS — M6281 Muscle weakness (generalized): Secondary | ICD-10-CM | POA: Diagnosis not present

## 2016-06-15 DIAGNOSIS — M25652 Stiffness of left hip, not elsewhere classified: Secondary | ICD-10-CM | POA: Diagnosis not present

## 2016-06-15 DIAGNOSIS — R2689 Other abnormalities of gait and mobility: Secondary | ICD-10-CM | POA: Diagnosis not present

## 2016-06-15 DIAGNOSIS — R2681 Unsteadiness on feet: Secondary | ICD-10-CM | POA: Diagnosis not present

## 2016-06-15 DIAGNOSIS — R296 Repeated falls: Secondary | ICD-10-CM | POA: Diagnosis not present
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
## 2016-06-20 ENCOUNTER — Encounter: Payer: Self-pay | Admitting: Physical Therapy

## 2016-06-20 ENCOUNTER — Ambulatory Visit: Payer: Medicare Other | Admitting: Physical Therapy

## 2016-06-20 DIAGNOSIS — R2689 Other abnormalities of gait and mobility: Secondary | ICD-10-CM | POA: Diagnosis not present

## 2016-06-20 DIAGNOSIS — M25652 Stiffness of left hip, not elsewhere classified: Secondary | ICD-10-CM | POA: Diagnosis not present

## 2016-06-20 DIAGNOSIS — R2681 Unsteadiness on feet: Secondary | ICD-10-CM

## 2016-06-20 DIAGNOSIS — M6281 Muscle weakness (generalized): Secondary | ICD-10-CM | POA: Diagnosis not present

## 2016-06-20 DIAGNOSIS — R296 Repeated falls: Secondary | ICD-10-CM | POA: Diagnosis not present
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
## 2016-06-21 ENCOUNTER — Ambulatory Visit: Payer: Medicare Other | Admitting: Physical Therapy

## 2016-06-23 ENCOUNTER — Telehealth (INDEPENDENT_AMBULATORY_CARE_PROVIDER_SITE_OTHER): Payer: Self-pay | Admitting: *Deleted

## 2016-06-23 NOTE — Telephone Encounter (Signed)
Can come and pick up a script 

## 2016-06-23 NOTE — Telephone Encounter (Signed)
Pt calling for RX refill oxycodone

## 2016-06-23 NOTE — Telephone Encounter (Signed)
Patient aware Rx ready at front desk  

## 2016-06-23 NOTE — Telephone Encounter (Signed)
Please advise 

## 2016-06-27 ENCOUNTER — Encounter: Payer: Self-pay | Admitting: Physical Therapy

## 2016-06-27 ENCOUNTER — Ambulatory Visit: Payer: Medicare Other | Attending: Orthopaedic Surgery | Admitting: Physical Therapy

## 2016-06-27 DIAGNOSIS — M6281 Muscle weakness (generalized): Secondary | ICD-10-CM

## 2016-06-27 DIAGNOSIS — R2689 Other abnormalities of gait and mobility: Secondary | ICD-10-CM

## 2016-06-27 DIAGNOSIS — R296 Repeated falls: Secondary | ICD-10-CM | POA: Diagnosis not present

## 2016-06-27 DIAGNOSIS — R2681 Unsteadiness on feet: Secondary | ICD-10-CM | POA: Diagnosis not present

## 2016-06-27 DIAGNOSIS — M25652 Stiffness of left hip, not elsewhere classified: Secondary | ICD-10-CM
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
## 2016-06-29 ENCOUNTER — Ambulatory Visit: Payer: Medicare Other | Admitting: Physical Therapy

## 2016-06-29 ENCOUNTER — Encounter: Payer: Self-pay | Admitting: Physical Therapy

## 2016-06-29 DIAGNOSIS — R2681 Unsteadiness on feet: Secondary | ICD-10-CM

## 2016-06-29 DIAGNOSIS — R2689 Other abnormalities of gait and mobility: Secondary | ICD-10-CM

## 2016-06-29 DIAGNOSIS — M25652 Stiffness of left hip, not elsewhere classified: Secondary | ICD-10-CM | POA: Diagnosis not present

## 2016-06-29 DIAGNOSIS — R296 Repeated falls: Secondary | ICD-10-CM | POA: Diagnosis not present

## 2016-06-29 DIAGNOSIS — M6281 Muscle weakness (generalized): Secondary | ICD-10-CM
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
## 2016-07-04 ENCOUNTER — Ambulatory Visit: Payer: Medicare Other | Admitting: Physical Therapy

## 2016-07-04 ENCOUNTER — Encounter: Payer: Self-pay | Admitting: Physical Therapy

## 2016-07-04 DIAGNOSIS — M25652 Stiffness of left hip, not elsewhere classified: Secondary | ICD-10-CM | POA: Diagnosis not present

## 2016-07-04 DIAGNOSIS — R2689 Other abnormalities of gait and mobility: Secondary | ICD-10-CM | POA: Diagnosis not present

## 2016-07-04 DIAGNOSIS — R2681 Unsteadiness on feet: Secondary | ICD-10-CM

## 2016-07-04 DIAGNOSIS — M6281 Muscle weakness (generalized): Secondary | ICD-10-CM

## 2016-07-04 DIAGNOSIS — R296 Repeated falls: Secondary | ICD-10-CM | POA: Diagnosis not present
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
## 2016-07-05 DIAGNOSIS — T8454XD Infection and inflammatory reaction due to internal left knee prosthesis, subsequent encounter: Secondary | ICD-10-CM | POA: Diagnosis not present

## 2016-07-07 ENCOUNTER — Ambulatory Visit: Payer: Medicare Other | Admitting: Physical Therapy

## 2016-07-07 ENCOUNTER — Encounter: Payer: Self-pay | Admitting: Physical Therapy

## 2016-07-07 DIAGNOSIS — R2689 Other abnormalities of gait and mobility: Secondary | ICD-10-CM

## 2016-07-07 DIAGNOSIS — R296 Repeated falls: Secondary | ICD-10-CM

## 2016-07-07 DIAGNOSIS — M6281 Muscle weakness (generalized): Secondary | ICD-10-CM

## 2016-07-07 DIAGNOSIS — R2681 Unsteadiness on feet: Secondary | ICD-10-CM | POA: Diagnosis not present

## 2016-07-07 DIAGNOSIS — M25652 Stiffness of left hip, not elsewhere classified: Secondary | ICD-10-CM | POA: Diagnosis not present
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
## 2016-07-11 ENCOUNTER — Ambulatory Visit: Payer: Medicare Other | Admitting: Physical Therapy

## 2016-07-13 ENCOUNTER — Encounter: Payer: Self-pay | Admitting: Physical Therapy

## 2016-07-13 ENCOUNTER — Ambulatory Visit: Payer: Medicare Other | Admitting: Physical Therapy

## 2016-07-13 DIAGNOSIS — R2689 Other abnormalities of gait and mobility: Secondary | ICD-10-CM | POA: Diagnosis not present

## 2016-07-13 DIAGNOSIS — M6281 Muscle weakness (generalized): Secondary | ICD-10-CM | POA: Diagnosis not present

## 2016-07-13 DIAGNOSIS — M25652 Stiffness of left hip, not elsewhere classified: Secondary | ICD-10-CM | POA: Diagnosis not present

## 2016-07-13 DIAGNOSIS — R2681 Unsteadiness on feet: Secondary | ICD-10-CM | POA: Diagnosis not present

## 2016-07-13 DIAGNOSIS — R296 Repeated falls: Secondary | ICD-10-CM
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
## 2016-07-18 ENCOUNTER — Encounter: Payer: Self-pay | Admitting: Physical Therapy

## 2016-07-18 ENCOUNTER — Ambulatory Visit: Payer: Medicare Other | Admitting: Physical Therapy

## 2016-07-18 DIAGNOSIS — M25652 Stiffness of left hip, not elsewhere classified: Secondary | ICD-10-CM | POA: Diagnosis not present

## 2016-07-18 DIAGNOSIS — R296 Repeated falls: Secondary | ICD-10-CM | POA: Diagnosis not present

## 2016-07-18 DIAGNOSIS — R2681 Unsteadiness on feet: Secondary | ICD-10-CM

## 2016-07-18 DIAGNOSIS — R2689 Other abnormalities of gait and mobility: Secondary | ICD-10-CM

## 2016-07-18 DIAGNOSIS — M6281 Muscle weakness (generalized): Secondary | ICD-10-CM
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
## 2016-07-19 ENCOUNTER — Ambulatory Visit (INDEPENDENT_AMBULATORY_CARE_PROVIDER_SITE_OTHER): Payer: Medicare Other | Admitting: Orthopaedic Surgery

## 2016-07-19 DIAGNOSIS — Z89612 Acquired absence of left leg above knee: Secondary | ICD-10-CM | POA: Diagnosis not present

## 2016-07-19 MED ORDER — HYDROCODONE-ACETAMINOPHEN 5-325 MG PO TABS: 1.0000 | ORAL_TABLET | Freq: Two times a day (BID) | ORAL | 0 refills | Status: DC | PRN

## 2016-07-19 NOTE — Progress Notes (Signed)
Mr. Uzzle is coming in today for me to see his above-knee prosthesis. He is annular with the walker but he does use a cane on occasion. He actually mowed his lawn just this past weekend. He is now 5 months out from his above-knee amputation. He has pain on occasion and would like to have one more refill of pain medication but he said he is getting along better psychologically as well and is just pleased overall.  On examination his prosthesis is fitting nicely. There still working on getting his stump to shrink little bit more so he knows he'll be adjustments that biotech will due to the prosthesis. From my standpoint at this point he will follow up as needed. If he needs anything and only time he knows to come see [place].

## 2016-07-20 ENCOUNTER — Ambulatory Visit: Payer: Medicare Other | Admitting: Physical Therapy

## 2016-07-22 IMAGING — CR DG KNEE COMPLETE 4+V*L*
4 series · 4 of 4 positions shown · non-contrast
Comparison: None.

CLINICAL DATA: Left knee pain, swelling, drainage

EXAM:
LEFT KNEE - COMPLETE 4+ VIEW

[x knee ap left (1 of 4)]
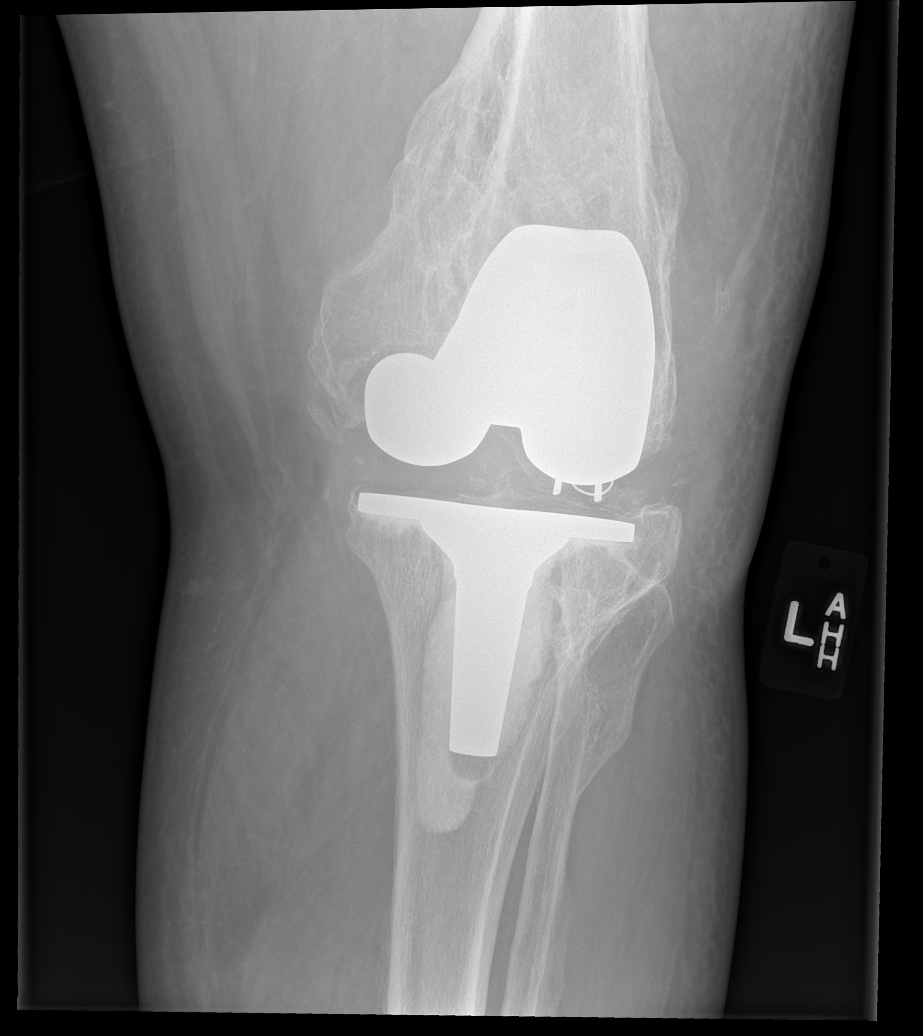

[x knee ap left (2 of 4)]
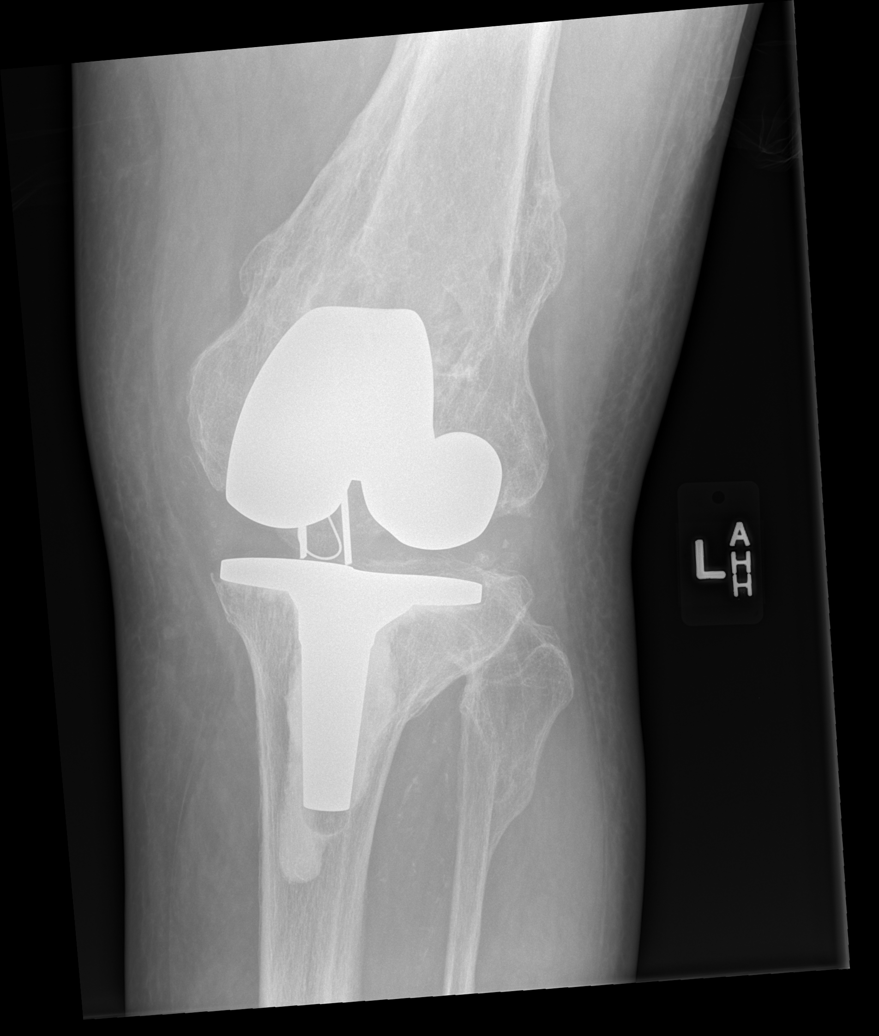

[x knee ap left (3 of 4)]
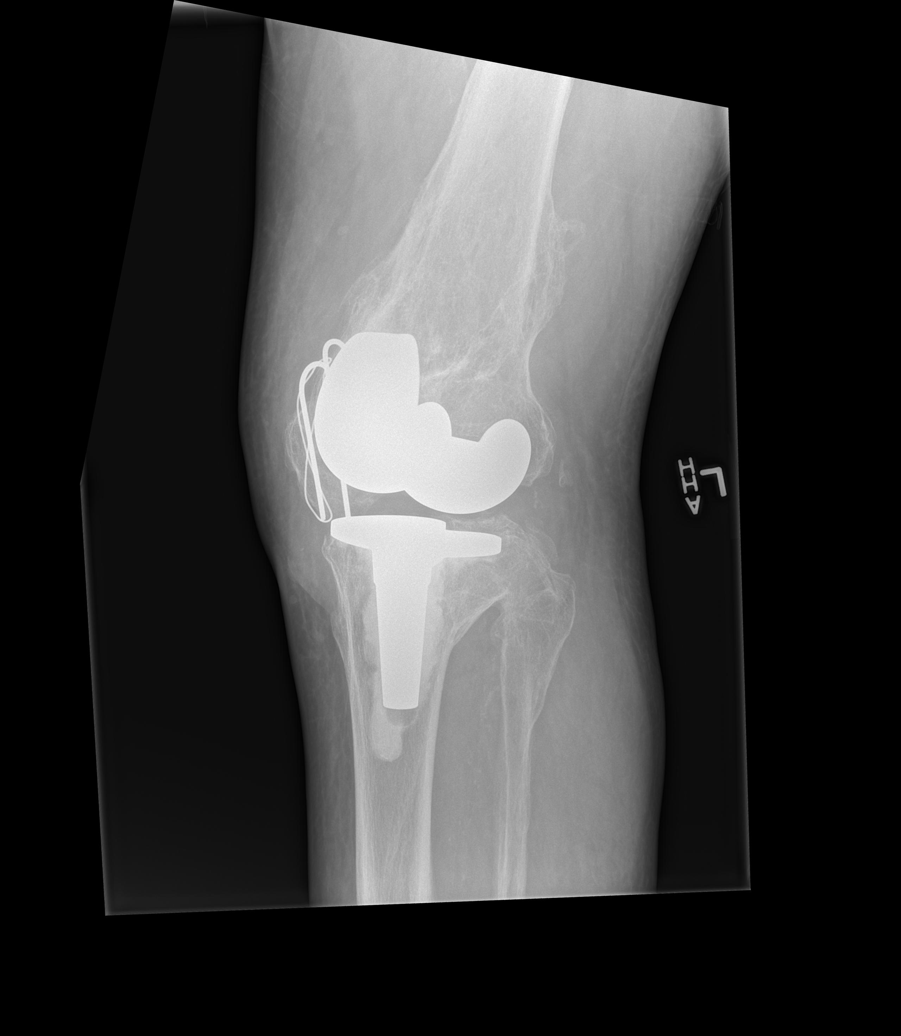

[x knee ap left (4 of 4)]
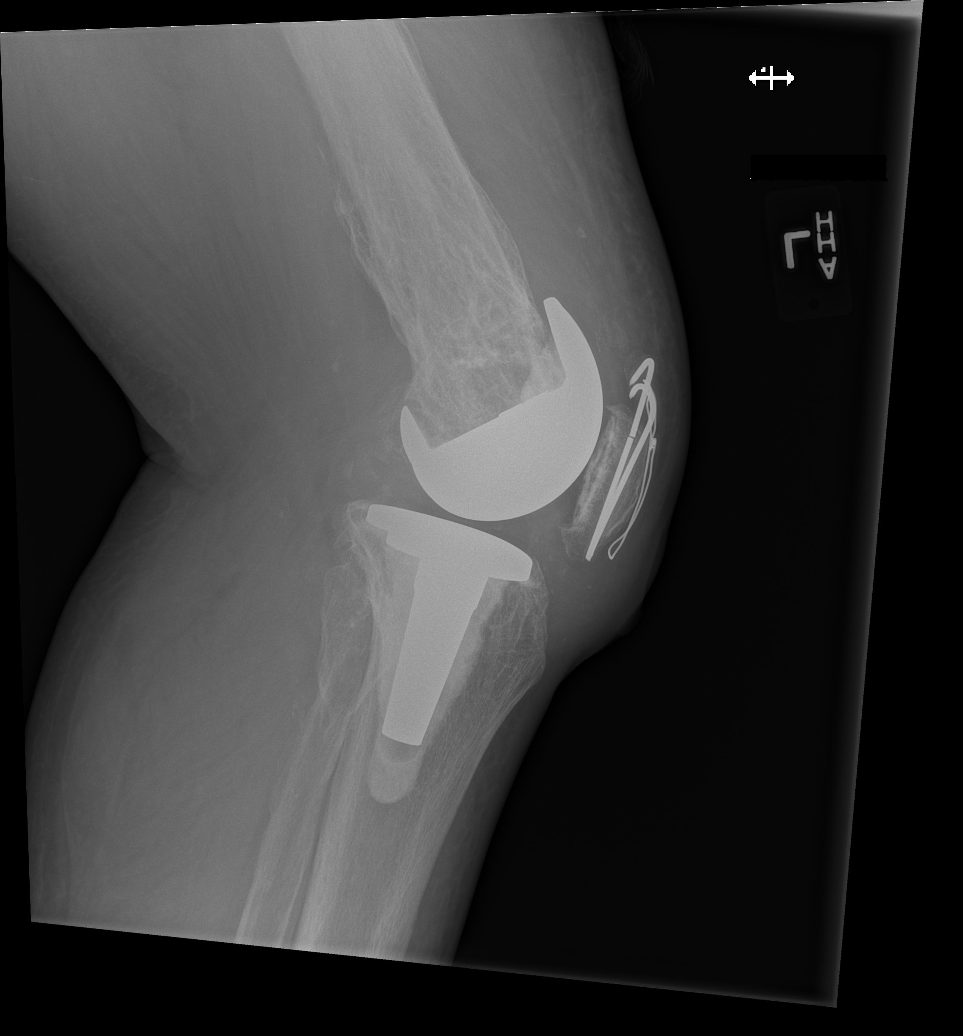

[4 of 4 positions shown; findings below may reference images not displayed]

FINDINGS: Left total knee arthroplasty. Two K-wires and cerclage wire
transfixing a healed patellar fracture. One of the 2 K-wires are
fractured.

Old healed fracture of the distal left femoral diametaphysis.
Posttraumatic deformity of the left proximal fibula. No acute
fracture or dislocation.

Diffuse soft tissue swelling around the left knee.
IMPRESSION: 1. Left total knee arthroplasty.
2. Diffuse soft tissue swelling around the left knee.

## 2016-07-25 ENCOUNTER — Encounter: Payer: Medicare Other | Admitting: Physical Therapy

## 2016-07-27 ENCOUNTER — Encounter: Payer: Self-pay | Admitting: Physical Therapy

## 2016-07-27 ENCOUNTER — Ambulatory Visit: Payer: Medicare Other | Attending: Orthopaedic Surgery | Admitting: Physical Therapy

## 2016-07-27 DIAGNOSIS — R2689 Other abnormalities of gait and mobility: Secondary | ICD-10-CM | POA: Diagnosis not present

## 2016-07-27 DIAGNOSIS — Z89612 Acquired absence of left leg above knee: Secondary | ICD-10-CM | POA: Insufficient documentation

## 2016-07-27 DIAGNOSIS — M25652 Stiffness of left hip, not elsewhere classified: Secondary | ICD-10-CM | POA: Insufficient documentation

## 2016-07-27 DIAGNOSIS — R296 Repeated falls: Secondary | ICD-10-CM | POA: Diagnosis not present

## 2016-07-27 DIAGNOSIS — M6281 Muscle weakness (generalized): Secondary | ICD-10-CM

## 2016-07-27 DIAGNOSIS — R2681 Unsteadiness on feet: Secondary | ICD-10-CM | POA: Diagnosis not present

## 2016-07-27 DIAGNOSIS — S78112A Complete traumatic amputation at level between left hip and knee, initial encounter: Secondary | ICD-10-CM
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
## 2016-08-01 ENCOUNTER — Ambulatory Visit: Payer: Medicare Other | Admitting: Physical Therapy

## 2016-08-01 ENCOUNTER — Encounter: Payer: Self-pay | Admitting: Physical Therapy

## 2016-08-01 DIAGNOSIS — R296 Repeated falls: Secondary | ICD-10-CM | POA: Diagnosis not present

## 2016-08-01 DIAGNOSIS — M6281 Muscle weakness (generalized): Secondary | ICD-10-CM | POA: Diagnosis not present

## 2016-08-01 DIAGNOSIS — R2689 Other abnormalities of gait and mobility: Secondary | ICD-10-CM | POA: Diagnosis not present

## 2016-08-01 DIAGNOSIS — Z89612 Acquired absence of left leg above knee: Secondary | ICD-10-CM | POA: Diagnosis not present

## 2016-08-01 DIAGNOSIS — R2681 Unsteadiness on feet: Secondary | ICD-10-CM

## 2016-08-01 DIAGNOSIS — M25652 Stiffness of left hip, not elsewhere classified: Secondary | ICD-10-CM | POA: Diagnosis not present
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
## 2016-08-02 ENCOUNTER — Telehealth: Payer: Self-pay | Admitting: Physical Therapy

## 2016-08-02 ENCOUNTER — Other Ambulatory Visit (INDEPENDENT_AMBULATORY_CARE_PROVIDER_SITE_OTHER): Payer: Self-pay | Admitting: Orthopaedic Surgery

## 2016-08-02 NOTE — Telephone Encounter (Signed)
Dr. Ninfa Linden, Kathryne Eriksson is being treated by physical therapy for Transfemoral Amputation.  They will benefit from use of bilateral forearm crutches in order to improve safety with functional mobility.    If you agree, please submit request in EPIC under referral for DME or fax to [facility] at [phone].   Thank you, Jamey Reas, PT, DPT PT Specializing in [place] Phone:  [phone]  Fax:  [phone] [facility] [address] [place], [place] [address]

## 2016-08-03 ENCOUNTER — Encounter: Payer: Medicare Other | Admitting: Physical Therapy

## 2016-08-04 DIAGNOSIS — T8454XD Infection and inflammatory reaction due to internal left knee prosthesis, subsequent encounter: Secondary | ICD-10-CM | POA: Diagnosis not present

## 2016-08-05 ENCOUNTER — Other Ambulatory Visit: Payer: Self-pay | Admitting: Internal Medicine

## 2016-08-07 ENCOUNTER — Encounter: Payer: Self-pay | Admitting: Internal Medicine

## 2016-08-08 MED ORDER — FUROSEMIDE 20 MG PO TABS: 20.0000 mg | ORAL_TABLET | Freq: Every day | ORAL | 1 refills | Status: DC

## 2016-08-09 ENCOUNTER — Ambulatory Visit (INDEPENDENT_AMBULATORY_CARE_PROVIDER_SITE_OTHER): Payer: Medicare Other | Admitting: Internal Medicine

## 2016-08-09 ENCOUNTER — Other Ambulatory Visit (INDEPENDENT_AMBULATORY_CARE_PROVIDER_SITE_OTHER): Payer: Medicare Other

## 2016-08-09 ENCOUNTER — Encounter: Payer: Self-pay | Admitting: Physical Therapy

## 2016-08-09 ENCOUNTER — Encounter: Payer: Self-pay | Admitting: Internal Medicine

## 2016-08-09 ENCOUNTER — Other Ambulatory Visit: Payer: Self-pay | Admitting: Internal Medicine

## 2016-08-09 ENCOUNTER — Telehealth: Payer: Self-pay

## 2016-08-09 ENCOUNTER — Ambulatory Visit: Payer: Medicare Other | Admitting: Physical Therapy

## 2016-08-09 VITALS — BP 122/68 | HR 61 | Ht 70.0 in | Wt 266.0 lb

## 2016-08-09 DIAGNOSIS — E1100 Type 2 diabetes mellitus with hyperosmolarity without nonketotic hyperglycemic-hyperosmolar coma (NKHHC): Secondary | ICD-10-CM | POA: Diagnosis not present

## 2016-08-09 DIAGNOSIS — M6281 Muscle weakness (generalized): Secondary | ICD-10-CM | POA: Diagnosis not present

## 2016-08-09 DIAGNOSIS — I10 Essential (primary) hypertension: Secondary | ICD-10-CM

## 2016-08-09 DIAGNOSIS — M25652 Stiffness of left hip, not elsewhere classified: Secondary | ICD-10-CM | POA: Diagnosis not present

## 2016-08-09 DIAGNOSIS — N179 Acute kidney failure, unspecified: Secondary | ICD-10-CM

## 2016-08-09 DIAGNOSIS — R296 Repeated falls: Secondary | ICD-10-CM

## 2016-08-09 DIAGNOSIS — R2689 Other abnormalities of gait and mobility: Secondary | ICD-10-CM

## 2016-08-09 DIAGNOSIS — Z0001 Encounter for general adult medical examination with abnormal findings: Secondary | ICD-10-CM

## 2016-08-09 DIAGNOSIS — R21 Rash and other nonspecific skin eruption: Secondary | ICD-10-CM | POA: Insufficient documentation

## 2016-08-09 DIAGNOSIS — R2681 Unsteadiness on feet: Secondary | ICD-10-CM

## 2016-08-09 DIAGNOSIS — E7800 Pure hypercholesterolemia, unspecified: Secondary | ICD-10-CM | POA: Diagnosis not present

## 2016-08-09 DIAGNOSIS — Z89612 Acquired absence of left leg above knee: Secondary | ICD-10-CM | POA: Diagnosis not present

## 2016-08-09 LAB — URINALYSIS, ROUTINE W REFLEX MICROSCOPIC
BILIRUBIN URINE: NEGATIVE
Hgb urine dipstick: NEGATIVE
Ketones, ur: NEGATIVE
Leukocytes, UA: NEGATIVE
Nitrite: NEGATIVE
PH: 6 (ref 5.0–8.0)
RBC / HPF: NONE SEEN (ref 0–?)
Specific Gravity, Urine: 1.02 (ref 1.000–1.030)
TOTAL PROTEIN, URINE-UPE24: NEGATIVE
UROBILINOGEN UA: 0.2 (ref 0.0–1.0)
Urine Glucose: NEGATIVE
WBC, UA: NONE SEEN (ref 0–?)

## 2016-08-09 LAB — HEPATIC FUNCTION PANEL
ALBUMIN: 3.7 g/dL (ref 3.5–5.2)
ALK PHOS: 74 U/L (ref 39–117)
ALT: 18 U/L (ref 0–53)
AST: 17 U/L (ref 0–37)
BILIRUBIN DIRECT: 0.1 mg/dL (ref 0.0–0.3)
TOTAL PROTEIN: 6.4 g/dL (ref 6.0–8.3)
Total Bilirubin: 0.3 mg/dL (ref 0.2–1.2)

## 2016-08-09 LAB — CBC WITH DIFFERENTIAL/PLATELET
BASOS ABS: 0.1 10*3/uL (ref 0.0–0.1)
Basophils Relative: 0.9 % (ref 0.0–3.0)
EOS ABS: 0.2 10*3/uL (ref 0.0–0.7)
Eosinophils Relative: 3.6 % (ref 0.0–5.0)
HCT: 32 % — ABNORMAL LOW (ref 39.0–52.0)
Hemoglobin: 10.7 g/dL — ABNORMAL LOW (ref 13.0–17.0)
Lymphocytes Relative: 26.6 % (ref 12.0–46.0)
Lymphs Abs: 1.6 10*3/uL (ref 0.7–4.0)
MCHC: 33.4 g/dL (ref 30.0–36.0)
MCV: 79.9 fl (ref 78.0–100.0)
MONO ABS: 0.7 10*3/uL (ref 0.1–1.0)
Monocytes Relative: 11.7 % (ref 3.0–12.0)
NEUTROS ABS: 3.4 10*3/uL (ref 1.4–7.7)
NEUTROS PCT: 57.2 % (ref 43.0–77.0)
PLATELETS: 114 10*3/uL — AB (ref 150.0–400.0)
RBC: 4.01 Mil/uL — AB (ref 4.22–5.81)
RDW: 16.8 % — ABNORMAL HIGH (ref 11.5–15.5)
WBC: 5.9 10*3/uL (ref 4.0–10.5)

## 2016-08-09 LAB — BASIC METABOLIC PANEL
BUN: 20 mg/dL (ref 6–23)
CHLORIDE: 106 meq/L (ref 96–112)
CO2: 28 meq/L (ref 19–32)
Calcium: 9.6 mg/dL (ref 8.4–10.5)
Creatinine, Ser: 1.58 mg/dL — ABNORMAL HIGH (ref 0.40–1.50)
GFR: 45.92 mL/min — ABNORMAL LOW (ref >60.00)
GLUCOSE: 99 mg/dL (ref 70–99)
Potassium: 4.7 mEq/L (ref 3.5–5.1)
SODIUM: 138 meq/L (ref 135–145)

## 2016-08-09 LAB — MICROALBUMIN / CREATININE URINE RATIO
Creatinine,U: 150.4 mg/dL
MICROALB/CREAT RATIO: 0.5 mg/g (ref 0.0–30.0)

## 2016-08-09 LAB — LIPID PANEL
Cholesterol: 130 mg/dL (ref 0–200)
HDL: 28.9 mg/dL — ABNORMAL LOW (ref >39.00)
LDL Cholesterol: 72 mg/dL (ref 0–99)
NONHDL: 100.79
Total CHOL/HDL Ratio: 4
Triglycerides: 145 mg/dL (ref 0.0–149.0)
VLDL: 29 mg/dL (ref 0.0–40.0)

## 2016-08-09 LAB — PSA: PSA: 0.3 ng/mL (ref 0.10–4.00)

## 2016-08-09 LAB — TSH: TSH: 10.33 u[IU]/mL — AB (ref 0.35–4.50)

## 2016-08-09 LAB — HEMOGLOBIN A1C: HEMOGLOBIN A1C: 6.3 % (ref 4.6–6.5)

## 2016-08-09 MED ORDER — FUROSEMIDE 20 MG PO TABS: 20.0000 mg | ORAL_TABLET | Freq: Two times a day (BID) | ORAL | 5 refills | Status: DC

## 2016-08-09 MED ORDER — METHYLPREDNISOLONE ACETATE 80 MG/ML IJ SUSP
80.0000 mg | Freq: Once | INTRAMUSCULAR | Status: AC
  Administered 2016-08-09: 80 mg via INTRAMUSCULAR

## 2016-08-09 MED ORDER — FUROSEMIDE 20 MG PO TABS: 20.0000 mg | ORAL_TABLET | Freq: Two times a day (BID) | ORAL | 3 refills | Status: DC

## 2016-08-09 MED ORDER — PREDNISONE 10 MG PO TABS: ORAL_TABLET | ORAL | 0 refills | Status: DC

## 2016-08-09 NOTE — Telephone Encounter (Signed)
Called pt, LVM.   

## 2016-08-09 NOTE — Patient Instructions (Addendum)
You had the steroid shot today  Please take all new medication as prescribed  - the prednisone (sent to [facility])  Please continue all other medications as before, and refills have been done if requested - the 90 day of lasix (#180 with 3 refills sent to [facility] and #60 sent to [facility]  Please have the pharmacy call with any other refills you may need.  Please continue your efforts at being more active, low cholesterol diet, and weight control.  You are otherwise up to date with prevention measures today.  Please keep your appointments with your specialists as you may have planned  You will be contacted regarding the referral for: colonoscopy  Please go to the LAB in the Basement (turn left off the elevator) for the tests to be done today  You will be contacted by phone if any changes need to be made immediately.  Otherwise, you will receive a letter about your results with an explanation, but please check with MyChart first.  Please remember to sign up for MyChart if you have not done so, as this will be important to you in the future with finding out test results, communicating by private email, and scheduling acute appointments online when needed.  Please return in 6 months, or sooner if needed, with Lab testing done 3-5 days before  [facility] to cancel the appt for next wk

## 2016-08-09 NOTE — Telephone Encounter (Signed)
Pt called back. °

## 2016-08-09 NOTE — Telephone Encounter (Signed)
-----   Message from Biagio Borg, MD sent at 08/09/2016 12:50 PM EDT ----- .Left message on MyChart, pt to cont same tx except  The test results show that your current treatment is OK, except the kidney function is suddenly moderately worse.  This could be medication related.  Please hold off on taking the Lasix, Prinivil, and Advil type medications as this can potentially make it worse, then return for repeat blood work on Mon May  21.  [place] to please inform pt, and actually instead of just blood work, we should see in office to check BP as well
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
## 2016-08-09 NOTE — Telephone Encounter (Signed)
Pt has been informed and expressed understanding.  

## 2016-08-09 NOTE — Patient Instructions (Signed)
Hip Flexor Stretch    Lying on back near edge of bed, bend one leg, foot flat. Hang other leg over edge, relaxed, thigh resting entirely on bed for __30 seconds. Repeat __5__ times. Do __2__ sessions per day. Advanced Exercise: Bend knee back keeping thigh in contact with bed.  http://gt2.exer.us/346   Copyright  V[employer]. All rights reserved.   With a countertop or table on your LEFT side, walk up/down counter (or around table). Have the table/countertop on your LEFT side with forearm crutch on RIGHT side. Walk forward and backwards or side stepping. Always keep the stable surface (countertop or table) on your LEFT side.

## 2016-08-11 NOTE — Assessment & Plan Note (Signed)

## 2016-08-11 NOTE — Assessment & Plan Note (Signed)
stable overall by history and exam, recent data reviewed with pt, and pt to continue medical treatment as before,  to f/u any worsening symptoms or concerns Lab Results  Component Value Date   HGBA1C 6.3 08/09/2016   Pt to call for cbg > 200, or onset polys

## 2016-08-11 NOTE — Assessment & Plan Note (Signed)
stable overall by history and exam, recent data reviewed with pt, and pt to continue medical treatment as before,  to f/u any worsening symptoms or concerns BP Readings from Last 3 Encounters:  08/09/16 122/68  03/06/16 123/70  02/29/16 103/70

## 2016-08-11 NOTE — Assessment & Plan Note (Addendum)
Exam most c/w angioedema/allergic rash, doubt cellulitis and no evidence for sirs; for depomedrol IM 80,. predpac asd,  to f/u any worsening symptoms or concerns  In addition to the time spent performing CPE, I spent an additional 15 minutes face to face,in which greater than 50% of this time was spent in counseling and coordination of care for patient's illness as documented, including the differential diagnosis, evalution and treatment for facial rash, DM, HTN and HLD

## 2016-08-11 NOTE — Assessment & Plan Note (Signed)
stable overall by history and exam, recent data reviewed with pt, and pt to continue medical treatment as before,  to f/u any worsening symptoms or concerns Lab Results  Component Value Date   LDLCALC 72 08/09/2016

## 2016-08-16 ENCOUNTER — Ambulatory Visit: Payer: Medicare Other | Admitting: Internal Medicine

## 2016-08-16 ENCOUNTER — Other Ambulatory Visit (INDEPENDENT_AMBULATORY_CARE_PROVIDER_SITE_OTHER): Payer: Medicare Other

## 2016-08-16 DIAGNOSIS — N179 Acute kidney failure, unspecified: Secondary | ICD-10-CM

## 2016-08-16 LAB — BASIC METABOLIC PANEL
BUN: 20 mg/dL (ref 6–23)
CHLORIDE: 106 meq/L (ref 96–112)
CO2: 28 meq/L (ref 19–32)
Calcium: 9.6 mg/dL (ref 8.4–10.5)
Creatinine, Ser: 1.23 mg/dL (ref 0.40–1.50)
GFR: 61.31 mL/min (ref >60.00)
GLUCOSE: 108 mg/dL — AB (ref 70–99)
POTASSIUM: 4.6 meq/L (ref 3.5–5.1)
Sodium: 139 mEq/L (ref 135–145)

## 2016-08-17 ENCOUNTER — Ambulatory Visit: Payer: Medicare Other | Admitting: Physical Therapy

## 2016-08-17 ENCOUNTER — Encounter: Payer: Self-pay | Admitting: Physical Therapy

## 2016-08-17 VITALS — BP 124/56 | HR 59

## 2016-08-17 DIAGNOSIS — M6281 Muscle weakness (generalized): Secondary | ICD-10-CM

## 2016-08-17 DIAGNOSIS — R296 Repeated falls: Secondary | ICD-10-CM | POA: Diagnosis not present

## 2016-08-17 DIAGNOSIS — M25652 Stiffness of left hip, not elsewhere classified: Secondary | ICD-10-CM | POA: Diagnosis not present

## 2016-08-17 DIAGNOSIS — R2681 Unsteadiness on feet: Secondary | ICD-10-CM | POA: Diagnosis not present

## 2016-08-17 DIAGNOSIS — R2689 Other abnormalities of gait and mobility: Secondary | ICD-10-CM

## 2016-08-17 DIAGNOSIS — Z89612 Acquired absence of left leg above knee: Secondary | ICD-10-CM | POA: Diagnosis not present
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
## 2016-08-24 ENCOUNTER — Ambulatory Visit: Payer: Medicare Other | Admitting: Physical Therapy

## 2016-08-24 ENCOUNTER — Encounter: Payer: Self-pay | Admitting: Physical Therapy

## 2016-08-24 DIAGNOSIS — M6281 Muscle weakness (generalized): Secondary | ICD-10-CM

## 2016-08-24 DIAGNOSIS — R296 Repeated falls: Secondary | ICD-10-CM

## 2016-08-24 DIAGNOSIS — R2689 Other abnormalities of gait and mobility: Secondary | ICD-10-CM

## 2016-08-24 DIAGNOSIS — M25652 Stiffness of left hip, not elsewhere classified: Secondary | ICD-10-CM | POA: Diagnosis not present

## 2016-08-24 DIAGNOSIS — R2681 Unsteadiness on feet: Secondary | ICD-10-CM

## 2016-08-24 DIAGNOSIS — Z89612 Acquired absence of left leg above knee: Secondary | ICD-10-CM | POA: Diagnosis not present
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
## 2016-08-30 ENCOUNTER — Ambulatory Visit: Payer: Medicare Other | Attending: Orthopaedic Surgery | Admitting: Physical Therapy

## 2016-08-30 ENCOUNTER — Encounter: Payer: Self-pay | Admitting: Physical Therapy

## 2016-08-30 DIAGNOSIS — R296 Repeated falls: Secondary | ICD-10-CM | POA: Diagnosis not present

## 2016-08-30 DIAGNOSIS — M6281 Muscle weakness (generalized): Secondary | ICD-10-CM | POA: Diagnosis not present

## 2016-08-30 DIAGNOSIS — M25652 Stiffness of left hip, not elsewhere classified: Secondary | ICD-10-CM | POA: Diagnosis not present

## 2016-08-30 DIAGNOSIS — R2689 Other abnormalities of gait and mobility: Secondary | ICD-10-CM

## 2016-08-30 DIAGNOSIS — R2681 Unsteadiness on feet: Secondary | ICD-10-CM | POA: Insufficient documentation
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
## 2016-09-06 ENCOUNTER — Ambulatory Visit: Payer: Medicare Other | Admitting: Physical Therapy

## 2016-09-06 ENCOUNTER — Encounter: Payer: Self-pay | Admitting: Physical Therapy

## 2016-09-06 DIAGNOSIS — R296 Repeated falls: Secondary | ICD-10-CM | POA: Diagnosis not present

## 2016-09-06 DIAGNOSIS — M6281 Muscle weakness (generalized): Secondary | ICD-10-CM | POA: Diagnosis not present

## 2016-09-06 DIAGNOSIS — R2689 Other abnormalities of gait and mobility: Secondary | ICD-10-CM

## 2016-09-06 DIAGNOSIS — R2681 Unsteadiness on feet: Secondary | ICD-10-CM

## 2016-09-06 DIAGNOSIS — M25652 Stiffness of left hip, not elsewhere classified: Secondary | ICD-10-CM | POA: Diagnosis not present
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
## 2016-09-09 ENCOUNTER — Ambulatory Visit (INDEPENDENT_AMBULATORY_CARE_PROVIDER_SITE_OTHER): Payer: Medicare Other | Admitting: *Deleted

## 2016-09-09 VITALS — BP 138/65 | HR 56 | Resp 20 | Ht 70.0 in | Wt 229.0 lb

## 2016-09-09 DIAGNOSIS — Z1211 Encounter for screening for malignant neoplasm of colon: Secondary | ICD-10-CM | POA: Diagnosis not present

## 2016-09-09 DIAGNOSIS — Z0000 Encounter for general adult medical examination without abnormal findings: Secondary | ICD-10-CM | POA: Diagnosis not present

## 2016-09-09 NOTE — Patient Instructions (Addendum)
[facility] in [place], [place] in: [place] Address: [address], [place], [place] [address] Hours: Open ? Closes 7PM Phone: [phone]  [facility] in [place], [place] Address: [address] [place], [place] [address] Hours: Open ? Closes 7PM Phone: [phone]  Continue doing brain stimulating activities (puzzles, reading, adult coloring books, staying active) to keep memory sharp.   Continue to eat heart healthy diet (full of fruits, vegetables, whole grains, lean protein, water--limit salt, fat, and sugar intake) and increase physical activity as tolerated.   Steven Charles , Thank you for taking time to come for your Medicare Wellness Visit. I appreciate your ongoing commitment to your health goals. Please review the following plan we discussed and let me know if I can assist you in the future.   These are the goals we discussed: Goals      Patient Stated   . patient (pt-stated)          Would like for left knee to feel better;        Other   . Continue to improve my mobility, work with physical therapy to become as independent as possible       This is a list of the screening recommended for you and due dates:  Health Maintenance  Topic Date Due  . Eye exam for diabetics  08/16/1955  . Pneumonia vaccines (2 of 2 - PPSV23) 02/15/2014  . Colon Cancer Screening  12/20/2015  . Flu Shot  10/26/2016  . Hemoglobin A1C  02/09/2017  . Complete foot exam   08/09/2017  . Tetanus Vaccine  01/24/2018  .  Hepatitis C: One time screening is recommended by Center for Disease Control  (CDC) for  adults born from [birth year] through 1967.   Completed
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
## 2016-09-09 NOTE — Progress Notes (Signed)
Pre visit review using our clinic review tool, if applicable. No additional management support is needed unless otherwise documented below in the visit note. 

## 2016-09-13 ENCOUNTER — Ambulatory Visit: Payer: Medicare Other | Admitting: Physical Therapy

## 2016-09-13 ENCOUNTER — Encounter: Payer: Self-pay | Admitting: Gastroenterology

## 2016-09-13 ENCOUNTER — Encounter: Payer: Self-pay | Admitting: Physical Therapy

## 2016-09-13 DIAGNOSIS — R2681 Unsteadiness on feet: Secondary | ICD-10-CM | POA: Diagnosis not present

## 2016-09-13 DIAGNOSIS — R2689 Other abnormalities of gait and mobility: Secondary | ICD-10-CM

## 2016-09-13 DIAGNOSIS — M25652 Stiffness of left hip, not elsewhere classified: Secondary | ICD-10-CM | POA: Diagnosis not present

## 2016-09-13 DIAGNOSIS — R296 Repeated falls: Secondary | ICD-10-CM | POA: Diagnosis not present

## 2016-09-13 DIAGNOSIS — M6281 Muscle weakness (generalized): Secondary | ICD-10-CM | POA: Diagnosis not present
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
## 2016-09-20 ENCOUNTER — Ambulatory Visit: Payer: Medicare Other | Admitting: Physical Therapy

## 2016-09-20 ENCOUNTER — Encounter: Payer: Self-pay | Admitting: Physical Therapy

## 2016-09-20 ENCOUNTER — Other Ambulatory Visit: Payer: Self-pay | Admitting: Family

## 2016-09-20 DIAGNOSIS — R2681 Unsteadiness on feet: Secondary | ICD-10-CM | POA: Diagnosis not present

## 2016-09-20 DIAGNOSIS — M25652 Stiffness of left hip, not elsewhere classified: Secondary | ICD-10-CM | POA: Diagnosis not present

## 2016-09-20 DIAGNOSIS — R296 Repeated falls: Secondary | ICD-10-CM | POA: Diagnosis not present

## 2016-09-20 DIAGNOSIS — E1100 Type 2 diabetes mellitus with hyperosmolarity without nonketotic hyperglycemic-hyperosmolar coma (NKHHC): Secondary | ICD-10-CM

## 2016-09-20 DIAGNOSIS — M6281 Muscle weakness (generalized): Secondary | ICD-10-CM | POA: Diagnosis not present

## 2016-09-20 DIAGNOSIS — R2689 Other abnormalities of gait and mobility: Secondary | ICD-10-CM
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
## 2016-09-28 ENCOUNTER — Other Ambulatory Visit: Payer: Self-pay | Admitting: Internal Medicine

## 2016-09-29 NOTE — Telephone Encounter (Signed)
Done hardcopy to [place]  

## 2016-09-29 NOTE — Telephone Encounter (Signed)
faxed

## 2016-10-02 ENCOUNTER — Other Ambulatory Visit: Payer: Self-pay | Admitting: Internal Medicine

## 2016-10-03 NOTE — Telephone Encounter (Signed)
Per chart rx was faxed back to [facility] on Friday. I called [facility] spoke w/Tiffany verified if rx was received. Per Tiffany they did not received. Gave MD authorization from Friday on the Lorazepam.../lmb

## 2016-10-18 ENCOUNTER — Telehealth (INDEPENDENT_AMBULATORY_CARE_PROVIDER_SITE_OTHER): Payer: Self-pay

## 2016-10-18 NOTE — Telephone Encounter (Signed)
Patient would like a Rx refill for Hydrocodone.  CB# [phone].  Please advise.  Thank You.

## 2016-10-19 ENCOUNTER — Other Ambulatory Visit (INDEPENDENT_AMBULATORY_CARE_PROVIDER_SITE_OTHER): Payer: Self-pay | Admitting: Orthopaedic Surgery

## 2016-10-19 MED ORDER — HYDROCODONE-ACETAMINOPHEN 5-325 MG PO TABS: 1.0000 | ORAL_TABLET | Freq: Two times a day (BID) | ORAL | 0 refills | Status: DC | PRN

## 2016-10-19 NOTE — Telephone Encounter (Signed)
Please advise 

## 2016-10-19 NOTE — Telephone Encounter (Signed)
Can come and pick up script 

## 2016-10-20 NOTE — Telephone Encounter (Signed)
Patient aware Rx ready at front desk  

## 2016-11-07 ENCOUNTER — Encounter: Payer: Self-pay | Admitting: Gastroenterology

## 2016-11-07 ENCOUNTER — Ambulatory Visit (AMBULATORY_SURGERY_CENTER): Payer: Self-pay | Admitting: *Deleted

## 2016-11-07 VITALS — Ht 70.0 in | Wt 258.0 lb

## 2016-11-07 DIAGNOSIS — Z1211 Encounter for screening for malignant neoplasm of colon: Secondary | ICD-10-CM

## 2016-11-07 MED ORDER — NA SULFATE-K SULFATE-MG SULF 17.5-3.13-1.6 GM/177ML PO SOLN: ORAL | 0 refills | Status: DC

## 2016-11-07 NOTE — Progress Notes (Signed)
Patient denies any allergies to eggs or soy. Patient denies any problems with anesthesia/sedation. Patient denies any oxygen use at home and does not take any diet/weight loss medications.  

## 2016-11-15 ENCOUNTER — Other Ambulatory Visit: Payer: Self-pay | Admitting: Internal Medicine

## 2016-11-16 NOTE — Telephone Encounter (Signed)
faxed

## 2016-11-16 NOTE — Telephone Encounter (Signed)
/  done Done hardcopy to [employer]

## 2016-11-21 ENCOUNTER — Encounter: Payer: Self-pay | Admitting: Gastroenterology

## 2016-11-21 ENCOUNTER — Ambulatory Visit (AMBULATORY_SURGERY_CENTER): Payer: Medicare Other | Admitting: Gastroenterology

## 2016-11-21 VITALS — BP 114/60 | HR 69 | Temp 98.7°F | Resp 12 | Ht 70.0 in | Wt 258.0 lb

## 2016-11-21 DIAGNOSIS — Z86010 Personal history of colonic polyps: Secondary | ICD-10-CM | POA: Diagnosis not present

## 2016-11-21 DIAGNOSIS — Z1212 Encounter for screening for malignant neoplasm of rectum: Secondary | ICD-10-CM

## 2016-11-21 DIAGNOSIS — Z1211 Encounter for screening for malignant neoplasm of colon: Secondary | ICD-10-CM

## 2016-11-21 DIAGNOSIS — K621 Rectal polyp: Secondary | ICD-10-CM

## 2016-11-21 DIAGNOSIS — D128 Benign neoplasm of rectum: Secondary | ICD-10-CM

## 2016-11-21 MED ORDER — SODIUM CHLORIDE 0.9 % IV SOLN: 500.0000 mL | INTRAVENOUS | Status: DC

## 2016-11-21 NOTE — Op Note (Signed)
[name] Procedure Date: 11/21/2016 2:10 PM MRN: [id number] Endoscopist: Ladene Artist , MD Age: 71 Referring MD:  Date of Birth: 1945/09/11 Gender: Male Account #: [id number] Procedure:                Colonoscopy Indications:              Screening for colorectal malignant neoplasm Medicines:                Monitored Anesthesia Care Procedure:                Pre-Anesthesia Assessment:                           - Prior to the procedure, a History and Physical                            was performed, and patient medications and                            allergies were reviewed. The patient's tolerance of                            previous anesthesia was also reviewed. The risks                            and benefits of the procedure and the sedation                            options and risks were discussed with the patient.                            All questions were answered, and informed consent                            was obtained. Prior Anticoagulants: The patient has                            taken no previous anticoagulant or antiplatelet                            agents. ASA Grade Assessment: III - A patient with                            severe systemic disease. After reviewing the risks                            and benefits, the patient was deemed in                            satisfactory condition to undergo the procedure.                           After obtaining informed consent, the colonoscope  was passed under direct vision. Throughout the                            procedure, the patient's blood pressure, pulse, and                            oxygen saturations were monitored continuously. The                            Model PCF-[id number] [id number]) scope was introduced                            through the anus and advanced to the the cecum,                            identified by  appendiceal orifice and ileocecal                            valve. The ileocecal valve, appendiceal orifice,                            and rectum were photographed. The quality of the                            bowel preparation was adequate after extensive                            lavage and suctioning. The patient tolerated the                            procedure well. The colonoscopy was somewhat                            difficult due to significant looping and a tortuous                            colon. Successful completion of the procedure was                            aided by using manual pressure, withdrawing and                            reinserting the scope and straightening and                            shortening the scope to obtain bowel loop reduction. Scope In: 2:23:50 PM Scope Out: 2:43:12 PM Scope Withdrawal Time: 0 hours 12 minutes 24 seconds  Total Procedure Duration: 0 hours 19 minutes 22 seconds  Findings:                 The perianal and digital rectal examinations were                            normal.  A 5 mm polyp was found in the distal sigmoid colon.                            The polyp was sessile. The polyp was removed with a                            cold snare. Resection and retrieval were complete.                           A few small-mouthed diverticula were found in the                            sigmoid colon.                           Internal hemorrhoids were found during                            retroflexion. The hemorrhoids were small and Grade                            I (internal hemorrhoids that do not prolapse).                           The exam was otherwise without abnormality on                            direct and retroflexion views. Complications:            No immediate complications. Estimated blood loss:                            None. Estimated Blood Loss:     Estimated blood loss:  none. Impression:               - One 5 mm polyp in the distal sigmoid colon,                            removed with a cold snare. Resected and retrieved.                           - Diverticulosis in the sigmoid colon.                           - Internal hemorrhoids.                           - The examination was otherwise normal on direct                            and retroflexion views. Recommendation:           - Repeat colonoscopy in 5 years for surveillance if                            polyp is  precancerous with a more extensive bowel                            prep.                           - Patient has a contact number available for                            emergencies. The signs and symptoms of potential                            delayed complications were discussed with the                            patient. Return to normal activities tomorrow.                            Written discharge instructions were provided to the                            patient.                           - Resume previous diet.                           - Continue present medications.                           - Await pathology results. Ladene Artist, MD 11/21/2016 2:46:45 PM This report has been signed electronically.

## 2016-11-21 NOTE — Progress Notes (Signed)
Called to room to assist during endoscopic procedure.  Patient ID and intended procedure confirmed with present staff. Received instructions for my participation in the procedure from the performing physician.  

## 2016-11-21 NOTE — Patient Instructions (Signed)
Impression/Recommendations:  Polyp handout given to patient. Diverticulosis handout given to patient. Hemorrhoid handout given to patient.  Repeat colonoscopy in 5 years for surveillance if polyp is precancerous.  Resume previous diet. Continue present medications.  YOU HAD AN ENDOSCOPIC PROCEDURE TODAY AT [facility]:   Refer to the procedure report that was given to you for any specific questions about what was found during the examination.  If the procedure report does not answer your questions, please call your gastroenterologist to clarify.  If you requested that your care partner not be given the details of your procedure findings, then the procedure report has been included in a sealed envelope for you to review at your convenience later.  YOU SHOULD EXPECT: Some feelings of bloating in the abdomen. Passage of more gas than usual.  Walking can help get rid of the air that was put into your GI tract during the procedure and reduce the bloating. If you had a lower endoscopy (such as a colonoscopy or flexible sigmoidoscopy) you may notice spotting of blood in your stool or on the toilet paper. If you underwent a bowel prep for your procedure, you may not have a normal bowel movement for a few days.  Please Note:  You might notice some irritation and congestion in your nose or some drainage.  This is from the oxygen used during your procedure.  There is no need for concern and it should clear up in a day or so.  SYMPTOMS TO REPORT IMMEDIATELY:   Following lower endoscopy (colonoscopy or flexible sigmoidoscopy):  Excessive amounts of blood in the stool  Significant tenderness or worsening of abdominal pains  Swelling of the abdomen that is new, acute  Fever of 100F or higher  For urgent or emergent issues, a gastroenterologist can be reached at any hour by calling [phone].   DIET:  We do recommend a small meal at first, but then you may proceed to your regular  diet.  Drink plenty of fluids but you should avoid alcoholic beverages for 24 hours.  ACTIVITY:  You should plan to take it easy for the rest of today and you should NOT DRIVE or use heavy machinery until tomorrow (because of the sedation medicines used during the test).    FOLLOW UP: Our staff will call the number listed on your records the next business day following your procedure to check on you and address any questions or concerns that you may have regarding the information given to you following your procedure. If we do not reach you, we will leave a message.  However, if you are feeling well and you are not experiencing any problems, there is no need to return our call.  We will assume that you have returned to your regular daily activities without incident.  If any biopsies were taken you will be contacted by phone or by letter within the next 1-3 weeks.  Please call us at [phone] if you have not heard about the biopsies in 3 weeks.    SIGNATURES/CONFIDENTIALITY: You and/or your care partner have signed paperwork which will be entered into your electronic medical record.  These signatures attest to the fact that that the information above on your After Visit Summary has been reviewed and is understood.  Full responsibility of the confidentiality of this discharge information lies with you and/or your care-partner.

## 2016-11-21 NOTE — Progress Notes (Signed)
Report given to PACU, vssReport given to PACU, vss 

## 2016-11-22 ENCOUNTER — Telehealth: Payer: Self-pay | Admitting: *Deleted

## 2016-11-22 NOTE — Telephone Encounter (Signed)
  Follow up Call-  Call back number 11/21/2016 12/04/2014  Post procedure Call Back phone  # [phone]  Permission to leave phone message Yes Yes  Some recent data might be hidden     Patient questions:  Do you have a fever, pain , or abdominal swelling? No. Pain Score  0 *  Have you tolerated food without any problems? Yes.    Have you been able to return to your normal activities? Yes.    Do you have any questions about your discharge instructions: Diet   No. Medications  No. Follow up visit  No.  Do you have questions or concerns about your Care? No.  Actions: * If pain score is 4 or above: No action needed, pain <4.

## 2016-11-24 ENCOUNTER — Encounter: Payer: Self-pay | Admitting: Gastroenterology

## 2016-12-26 ENCOUNTER — Encounter: Payer: Self-pay | Admitting: Gastroenterology

## 2016-12-26 ENCOUNTER — Ambulatory Visit (AMBULATORY_SURGERY_CENTER): Payer: Self-pay | Admitting: *Deleted

## 2016-12-26 VITALS — Ht 70.0 in | Wt 270.0 lb

## 2016-12-26 DIAGNOSIS — K2270 Barrett's esophagus without dysplasia: Secondary | ICD-10-CM

## 2016-12-26 NOTE — Progress Notes (Signed)
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

## 2017-01-06 ENCOUNTER — Telehealth (INDEPENDENT_AMBULATORY_CARE_PROVIDER_SITE_OTHER): Payer: Self-pay | Admitting: Orthopaedic Surgery

## 2017-01-06 NOTE — Telephone Encounter (Signed)
Steven Charles with [employer] called advised the Rx that was faxed over is to light to read. The number to contact Steven Charles is [phone]

## 2017-01-06 NOTE — Telephone Encounter (Signed)
Re-faxed.

## 2017-01-09 ENCOUNTER — Encounter: Payer: Medicare Other | Admitting: Gastroenterology

## 2017-01-09 ENCOUNTER — Ambulatory Visit (AMBULATORY_SURGERY_CENTER): Payer: Medicare Other | Admitting: Gastroenterology

## 2017-01-09 ENCOUNTER — Encounter: Payer: Self-pay | Admitting: Gastroenterology

## 2017-01-09 VITALS — BP 127/62 | HR 52 | Temp 98.0°F | Resp 13 | Ht 70.0 in | Wt 270.0 lb

## 2017-01-09 DIAGNOSIS — K2270 Barrett's esophagus without dysplasia: Secondary | ICD-10-CM

## 2017-01-09 MED ORDER — SODIUM CHLORIDE 0.9 % IV SOLN: 500.0000 mL | INTRAVENOUS | Status: DC

## 2017-01-09 NOTE — Patient Instructions (Signed)
Discharge instructions given. Biopsies taken. Handouts on Barretts and a hiatal hernia. Resume previous medications. YOU HAD AN ENDOSCOPIC PROCEDURE TODAY AT [facility]:   Refer to the procedure report that was given to you for any specific questions about what was found during the examination.  If the procedure report does not answer your questions, please call your gastroenterologist to clarify.  If you requested that your care partner not be given the details of your procedure findings, then the procedure report has been included in a sealed envelope for you to review at your convenience later.  YOU SHOULD EXPECT: Some feelings of bloating in the abdomen. Passage of more gas than usual.  Walking can help get rid of the air that was put into your GI tract during the procedure and reduce the bloating. If you had a lower endoscopy (such as a colonoscopy or flexible sigmoidoscopy) you may notice spotting of blood in your stool or on the toilet paper. If you underwent a bowel prep for your procedure, you may not have a normal bowel movement for a few days.  Please Note:  You might notice some irritation and congestion in your nose or some drainage.  This is from the oxygen used during your procedure.  There is no need for concern and it should clear up in a day or so.  SYMPTOMS TO REPORT IMMEDIATELY:    Following upper endoscopy (EGD)  Vomiting of blood or coffee ground material  New chest pain or pain under the shoulder blades  Painful or persistently difficult swallowing  New shortness of breath  Fever of 100F or higher  Black, tarry-looking stools  For urgent or emergent issues, a gastroenterologist can be reached at any hour by calling [phone].   DIET:  We do recommend a small meal at first, but then you may proceed to your regular diet.  Drink plenty of fluids but you should avoid alcoholic beverages for 24 hours.  ACTIVITY:  You should plan to take it easy for  the rest of today and you should NOT DRIVE or use heavy machinery until tomorrow (because of the sedation medicines used during the test).    FOLLOW UP: Our staff will call the number listed on your records the next business day following your procedure to check on you and address any questions or concerns that you may have regarding the information given to you following your procedure. If we do not reach you, we will leave a message.  However, if you are feeling well and you are not experiencing any problems, there is no need to return our call.  We will assume that you have returned to your regular daily activities without incident.  If any biopsies were taken you will be contacted by phone or by letter within the next 1-3 weeks.  Please call us at [phone] if you have not heard about the biopsies in 3 weeks.    SIGNATURES/CONFIDENTIALITY: You and/or your care partner have signed paperwork which will be entered into your electronic medical record.  These signatures attest to the fact that that the information above on your After Visit Summary has been reviewed and is understood.  Full responsibility of the confidentiality of this discharge information lies with you and/or your care-partner.

## 2017-01-09 NOTE — Progress Notes (Signed)
Report given to PACU, vss 

## 2017-01-09 NOTE — Op Note (Signed)
[facility] Patient Name: Steven Charles Procedure Date: 01/09/2017 10:03 AM MRN: [id number] Endoscopist: Ladene Artist , MD Age: 71 Referring MD:  Date of Birth: [birth year] Gender: Male Account #: [id number] Procedure:                Upper GI endoscopy Indications:              Follow-up of Barrett's esophagus Medicines:                Monitored Anesthesia Care Procedure:                Pre-Anesthesia Assessment:                           - Prior to the procedure, a History and Physical                            was performed, and patient medications and                            allergies were reviewed. The patient's tolerance of                            previous anesthesia was also reviewed. The risks                            and benefits of the procedure and the sedation                            options and risks were discussed with the patient.                            All questions were answered, and informed consent                            was obtained. Prior Anticoagulants: The patient has                            taken no previous anticoagulant or antiplatelet                            agents. ASA Grade Assessment: III - A patient with                            severe systemic disease. After reviewing the risks                            and benefits, the patient was deemed in                            satisfactory condition to undergo the procedure.                           After obtaining informed consent, the endoscope was  passed under direct vision. Throughout the                            procedure, the patient's blood pressure, pulse, and                            oxygen saturations were monitored continuously. The                            Model GIF-HQ190 [id number]) scope was introduced                            through the mouth, and advanced to the second part                            of duodenum. The  upper GI endoscopy was                            accomplished without difficulty. The patient                            tolerated the procedure well. Scope In: Scope Out: Findings:                 There were esophageal mucosal changes secondary to                            established long-segment Barrett's disease present                            in the distal esophagus. The maximum longitudinal                            extent of these mucosal changes was 8 cm in length.                            6 cm of circumfirential Barrett's Mucosa was                            biopsied in 4 quadrants with a cold forceps for                            histology every 2 cm. A total of 2 specimen bottles                            were sent to pathology.                           The exam of the esophagus was otherwise normal.                           A small hiatal hernia was present.  The exam of the stomach was otherwise normal.                           The duodenal bulb and second portion of the                            duodenum were normal. Complications:            No immediate complications. Estimated Blood Loss:     Estimated blood loss was minimal. Impression:               - Esophageal mucosal changes secondary to                            established long-segment Barrett's disease.                            Biopsied.                           - Small hiatal hernia.                           - Normal duodenal bulb and second portion of the                            duodenum. Recommendation:           - Patient has a contact number available for                            emergencies. The signs and symptoms of potential                            delayed complications were discussed with the                            patient. Return to normal activities tomorrow.                            Written discharge instructions were provided to the                             patient.                           - Resume previous diet.                           - Continue present medications.                           - Await pathology results.                           - Repeat upper endoscopy in 2 years for  surveillance if no dysplasia. Ladene Artist, MD 01/09/2017 10:27:23 AM This report has been signed electronically.

## 2017-01-09 NOTE — Progress Notes (Signed)
Pt's states no medical or surgical changes since previsit or office visit. 

## 2017-01-10 ENCOUNTER — Telehealth (INDEPENDENT_AMBULATORY_CARE_PROVIDER_SITE_OTHER): Payer: Self-pay | Admitting: Orthopaedic Surgery

## 2017-01-10 ENCOUNTER — Telehealth: Payer: Self-pay | Admitting: *Deleted

## 2017-01-10 NOTE — Telephone Encounter (Signed)
I didn't call him.  Last week we filled out a script for [facility] for him.  Maybe someone called for him to pick it up.

## 2017-01-10 NOTE — Telephone Encounter (Signed)
Pt's wife calling states patient is doing fine.

## 2017-01-10 NOTE — Telephone Encounter (Signed)
Unable to leave message per request number  Disconnected.

## 2017-01-10 NOTE — Telephone Encounter (Signed)
See below, did you call him?

## 2017-01-10 NOTE — Telephone Encounter (Signed)
Patient returned Dr. Trevor Mace phone call.  CB#[phone]

## 2017-01-16 ENCOUNTER — Ambulatory Visit (INDEPENDENT_AMBULATORY_CARE_PROVIDER_SITE_OTHER): Payer: Medicare Other | Admitting: Orthopaedic Surgery

## 2017-01-16 DIAGNOSIS — Z89612 Acquired absence of left leg above knee: Secondary | ICD-10-CM | POA: Diagnosis not present

## 2017-01-16 NOTE — Progress Notes (Signed)
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 He has a prosthesis that is not fitting correctly. He understands it does take a lot of energy to get around on this type of prosthesis but even the prosthetics consultant/prosthetist agrees that a better fitting prosthesis would be appropriate. We are worried about his balance and coordination and him being a fall risk.  On examination the prosthesis he has now is fitting loosely and poorly. Is not supporting his weight well. He is moderately obese. There is no evidence of stump breakdown.  I did give him a prescription to take to biotech for a new left above-knee prosthesis. All questions and concerns were answered and addressed.

## 2017-01-20 ENCOUNTER — Encounter: Payer: Self-pay | Admitting: Gastroenterology

## 2017-02-08 ENCOUNTER — Ambulatory Visit: Payer: Medicare Other | Admitting: Internal Medicine

## 2017-02-26 ENCOUNTER — Other Ambulatory Visit: Payer: Self-pay

## 2017-02-26 ENCOUNTER — Encounter (HOSPITAL_COMMUNITY): Payer: Self-pay | Admitting: *Deleted

## 2017-02-26 ENCOUNTER — Emergency Department (HOSPITAL_COMMUNITY)
Admission: EM | Admit: 2017-02-26 | Discharge: 2017-02-26 | Disposition: A | Discharge status: Home / Self Care | Payer: Medicare Other | Attending: Emergency Medicine | Admitting: Emergency Medicine

## 2017-02-26 ENCOUNTER — Emergency Department (HOSPITAL_COMMUNITY): Payer: Medicare Other

## 2017-02-26 DIAGNOSIS — Z87891 Personal history of nicotine dependence: Secondary | ICD-10-CM | POA: Diagnosis not present

## 2017-02-26 DIAGNOSIS — E1140 Type 2 diabetes mellitus with diabetic neuropathy, unspecified: Secondary | ICD-10-CM | POA: Diagnosis not present

## 2017-02-26 DIAGNOSIS — I509 Heart failure, unspecified: Secondary | ICD-10-CM | POA: Diagnosis not present

## 2017-02-26 DIAGNOSIS — I130 Hypertensive heart and chronic kidney disease with heart failure and stage 1 through stage 4 chronic kidney disease, or unspecified chronic kidney disease: Secondary | ICD-10-CM | POA: Insufficient documentation

## 2017-02-26 DIAGNOSIS — Z951 Presence of aortocoronary bypass graft: Secondary | ICD-10-CM | POA: Insufficient documentation

## 2017-02-26 DIAGNOSIS — W19XXXA Unspecified fall, initial encounter: Secondary | ICD-10-CM

## 2017-02-26 DIAGNOSIS — N189 Chronic kidney disease, unspecified: Secondary | ICD-10-CM | POA: Diagnosis not present

## 2017-02-26 DIAGNOSIS — I2510 Atherosclerotic heart disease of native coronary artery without angina pectoris: Secondary | ICD-10-CM | POA: Insufficient documentation

## 2017-02-26 DIAGNOSIS — Z96652 Presence of left artificial knee joint: Secondary | ICD-10-CM | POA: Diagnosis not present

## 2017-02-26 DIAGNOSIS — S79912A Unspecified injury of left hip, initial encounter: Secondary | ICD-10-CM | POA: Diagnosis not present

## 2017-02-26 DIAGNOSIS — M25552 Pain in left hip: Secondary | ICD-10-CM | POA: Diagnosis not present

## 2017-02-26 DIAGNOSIS — Z79899 Other long term (current) drug therapy: Secondary | ICD-10-CM | POA: Insufficient documentation

## 2017-02-26 DIAGNOSIS — Z7982 Long term (current) use of aspirin: Secondary | ICD-10-CM | POA: Insufficient documentation

## 2017-02-26 MED ORDER — LIDOCAINE 5 % EX PTCH: 1.0000 | MEDICATED_PATCH | CUTANEOUS | 0 refills | Status: DC
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
## 2017-02-26 NOTE — Discharge Instructions (Signed)
You were seen in the emergency department for left hip pain following a fall last week.  Your x-ray did not show any fractures or dislocation of your left hip.  This pain is likely related to your muscles in bruising.   Take Ibuprofen and Tylenol as needed for pain. Apply warm compresses to the area throughout the day. I have prescribed you lidocaine patches, you may apply these to the painful areas, these are also available over the counter.   Follow-up with your primary care provider in 5 days if still having pain.  Return to the emergency department for any new or worsening symptoms including, but not limited to worsening pain, numbness or tingling the site of injury, or any additional falls.

## 2017-02-26 NOTE — ED Triage Notes (Signed)
Pt reports he fell back on his buttocks on to grass . Pt denies  LOc or feelyh dizzy. Pt now has pain Lt hip pain 3/10. Pt has a BKA to same side.

## 2017-02-27 ENCOUNTER — Other Ambulatory Visit: Payer: Self-pay

## 2017-03-02 DIAGNOSIS — Z89612 Acquired absence of left leg above knee: Secondary | ICD-10-CM | POA: Diagnosis not present

## 2017-03-03 ENCOUNTER — Encounter: Payer: Self-pay | Admitting: Cardiology
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
## 2017-03-14 ENCOUNTER — Other Ambulatory Visit: Payer: Self-pay | Admitting: Internal Medicine

## 2017-03-22 ENCOUNTER — Ambulatory Visit (HOSPITAL_COMMUNITY)
Admission: RE | Admit: 2017-03-22 | Discharge: 2017-03-22 | Disposition: A | Discharge status: Home / Self Care | Payer: Medicare Other | Source: Ambulatory Visit | Attending: Cardiovascular Disease | Admitting: Cardiovascular Disease

## 2017-03-22 ENCOUNTER — Ambulatory Visit: Payer: Medicare Other | Admitting: Cardiology

## 2017-03-22 ENCOUNTER — Ambulatory Visit (HOSPITAL_COMMUNITY)
Admission: RE | Admit: 2017-03-22 | Payer: Medicare Other | Source: Ambulatory Visit | Attending: Cardiology | Admitting: Cardiology

## 2017-03-22 DIAGNOSIS — I2510 Atherosclerotic heart disease of native coronary artery without angina pectoris: Secondary | ICD-10-CM | POA: Diagnosis not present

## 2017-03-23 ENCOUNTER — Encounter: Payer: Self-pay | Admitting: Cardiology

## 2017-03-23 ENCOUNTER — Ambulatory Visit: Payer: Medicare Other | Admitting: Cardiology

## 2017-03-23 VITALS — BP 136/60 | HR 57 | Ht 70.0 in | Wt 269.0 lb

## 2017-03-23 DIAGNOSIS — E7800 Pure hypercholesterolemia, unspecified: Secondary | ICD-10-CM

## 2017-03-23 DIAGNOSIS — I25810 Atherosclerosis of coronary artery bypass graft(s) without angina pectoris: Secondary | ICD-10-CM | POA: Diagnosis not present

## 2017-03-23 DIAGNOSIS — I679 Cerebrovascular disease, unspecified: Secondary | ICD-10-CM | POA: Diagnosis not present

## 2017-03-23 DIAGNOSIS — I10 Essential (primary) hypertension: Secondary | ICD-10-CM | POA: Diagnosis not present
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
## 2017-03-23 NOTE — Patient Instructions (Signed)
Medication Instructions:   NO CHANGE  Labwork:  Your physician recommends that you HAVE LAB WORK TODAY  Follow-Up:  Your physician wants you to follow-up in: ONE YEAR WITH DR CRENSHAW You will receive a reminder letter in the mail two months in advance. If you don't receive a letter, please call our office to schedule the follow-up appointment.   If you need a refill on your cardiac medications before your next appointment, please call your pharmacy.    

## 2017-03-27 ENCOUNTER — Other Ambulatory Visit: Payer: Self-pay | Admitting: *Deleted

## 2017-03-27 DIAGNOSIS — I6523 Occlusion and stenosis of bilateral carotid arteries: Secondary | ICD-10-CM

## 2017-03-31 ENCOUNTER — Other Ambulatory Visit: Payer: Self-pay | Admitting: Cardiology

## 2017-03-31 ENCOUNTER — Telehealth: Payer: Self-pay | Admitting: Internal Medicine

## 2017-03-31 NOTE — Telephone Encounter (Signed)
I spoke with patient and he is not interested. He stated that he has been getting bogus calls from them. We don't believe the company is legitimate.   I decline to return call.

## 2017-03-31 NOTE — Telephone Encounter (Signed)
Copied from [place] [id number]. Topic: Quick Communication - See Telephone Encounter >> Mar 31, 2017  2:08 PM Cleaster Corin, [place] wrote: CRM for notification. See Telephone encounter for:   03/31/17. Mark from [employer] to see if fax has been received for pt. Elta Gu[place] can be reached at [phone]

## 2017-04-03 ENCOUNTER — Other Ambulatory Visit: Payer: Self-pay | Admitting: Internal Medicine

## 2017-04-03 DIAGNOSIS — K2270 Barrett's esophagus without dysplasia: Secondary | ICD-10-CM

## 2017-04-04 NOTE — Telephone Encounter (Signed)
Done erx 

## 2017-04-06 DIAGNOSIS — I25810 Atherosclerosis of coronary artery bypass graft(s) without angina pectoris: Secondary | ICD-10-CM | POA: Diagnosis not present

## 2017-04-06 LAB — COMPREHENSIVE METABOLIC PANEL
ALBUMIN: 4.2 g/dL (ref 3.5–4.8)
ALK PHOS: 104 IU/L (ref 39–117)
ALT: 17 IU/L (ref 0–44)
AST: 17 IU/L (ref 0–40)
Albumin/Globulin Ratio: 1.6 (ref 1.2–2.2)
BUN / CREAT RATIO: 13 (ref 10–24)
BUN: 16 mg/dL (ref 8–27)
Bilirubin Total: 0.3 mg/dL (ref 0.0–1.2)
CALCIUM: 10 mg/dL (ref 8.6–10.2)
CO2: 23 mmol/L (ref 20–29)
CREATININE: 1.25 mg/dL (ref 0.76–1.27)
Chloride: 102 mmol/L (ref 96–106)
GFR calc Af Amer: 66 mL/min/{1.73_m2} (ref >59)
GFR, EST NON AFRICAN AMERICAN: 57 mL/min/{1.73_m2} — AB (ref >59)
GLOBULIN, TOTAL: 2.7 g/dL (ref 1.5–4.5)
GLUCOSE: 98 mg/dL (ref 65–99)
Potassium: 4.7 mmol/L (ref 3.5–5.2)
SODIUM: 139 mmol/L (ref 134–144)
Total Protein: 6.9 g/dL (ref 6.0–8.5)

## 2017-04-06 LAB — LIPID PANEL
CHOLESTEROL TOTAL: 152 mg/dL (ref 100–199)
Chol/HDL Ratio: 4.2 ratio (ref 0.0–5.0)
HDL: 36 mg/dL — ABNORMAL LOW (ref >39)
LDL Calculated: 96 mg/dL (ref 0–99)
Triglycerides: 102 mg/dL (ref 0–149)
VLDL Cholesterol Cal: 20 mg/dL (ref 5–40)

## 2017-04-07 ENCOUNTER — Telehealth: Payer: Self-pay | Admitting: *Deleted

## 2017-04-07 DIAGNOSIS — I10 Essential (primary) hypertension: Secondary | ICD-10-CM

## 2017-04-07 DIAGNOSIS — E785 Hyperlipidemia, unspecified: Secondary | ICD-10-CM

## 2017-04-07 MED ORDER — ATORVASTATIN CALCIUM 80 MG PO TABS: 80.0000 mg | ORAL_TABLET | Freq: Every day | ORAL | 3 refills | Status: DC

## 2017-04-07 NOTE — Telephone Encounter (Signed)
-----   Message from Lelon Perla, MD sent at 04/06/2017  5:01 PM EST ----- Increase lipitor to 80 mg daily; lipids and liver 4 weeks [name]

## 2017-04-07 NOTE — Telephone Encounter (Signed)
Patient made aware of results and verbalized his understanding. The patient will increase his Atorvastatin to 80 mg daily and he will come back for repeat fasting labs in one month.

## 2017-05-04 ENCOUNTER — Other Ambulatory Visit: Payer: Self-pay | Admitting: Internal Medicine

## 2017-05-23 ENCOUNTER — Encounter: Payer: Self-pay | Admitting: *Deleted

## 2017-06-01 DIAGNOSIS — H524 Presbyopia: Secondary | ICD-10-CM | POA: Diagnosis not present

## 2017-06-02 ENCOUNTER — Ambulatory Visit (INDEPENDENT_AMBULATORY_CARE_PROVIDER_SITE_OTHER): Payer: Medicare HMO | Admitting: Internal Medicine

## 2017-06-02 ENCOUNTER — Other Ambulatory Visit (INDEPENDENT_AMBULATORY_CARE_PROVIDER_SITE_OTHER): Payer: Medicare HMO

## 2017-06-02 ENCOUNTER — Other Ambulatory Visit: Payer: Self-pay | Admitting: Internal Medicine

## 2017-06-02 ENCOUNTER — Encounter: Payer: Self-pay | Admitting: Internal Medicine

## 2017-06-02 VITALS — BP 124/80 | HR 67 | Temp 97.8°F | Ht 70.0 in | Wt 276.0 lb

## 2017-06-02 DIAGNOSIS — Z0000 Encounter for general adult medical examination without abnormal findings: Secondary | ICD-10-CM

## 2017-06-02 DIAGNOSIS — E11649 Type 2 diabetes mellitus with hypoglycemia without coma: Secondary | ICD-10-CM | POA: Diagnosis not present

## 2017-06-02 DIAGNOSIS — Z89612 Acquired absence of left leg above knee: Secondary | ICD-10-CM | POA: Diagnosis not present

## 2017-06-02 DIAGNOSIS — Z23 Encounter for immunization: Secondary | ICD-10-CM

## 2017-06-02 DIAGNOSIS — I509 Heart failure, unspecified: Secondary | ICD-10-CM | POA: Diagnosis not present

## 2017-06-02 LAB — LIPID PANEL
CHOLESTEROL: 143 mg/dL (ref 0–200)
HDL: 35 mg/dL — AB (ref >39.00)
LDL Cholesterol: 77 mg/dL (ref 0–99)
NonHDL: 108.26
TRIGLYCERIDES: 156 mg/dL — AB (ref 0.0–149.0)
Total CHOL/HDL Ratio: 4
VLDL: 31.2 mg/dL (ref 0.0–40.0)

## 2017-06-02 LAB — BASIC METABOLIC PANEL
BUN: 18 mg/dL (ref 6–23)
CALCIUM: 9.8 mg/dL (ref 8.4–10.5)
CO2: 28 mEq/L (ref 19–32)
CREATININE: 1.29 mg/dL (ref 0.40–1.50)
Chloride: 104 mEq/L (ref 96–112)
GFR: 57.9 mL/min — AB (ref >60.00)
Glucose, Bld: 90 mg/dL (ref 70–99)
Potassium: 4.6 mEq/L (ref 3.5–5.1)
SODIUM: 138 meq/L (ref 135–145)

## 2017-06-02 LAB — CBC WITH DIFFERENTIAL/PLATELET
BASOS ABS: 0 10*3/uL (ref 0.0–0.1)
Basophils Relative: 0.5 % (ref 0.0–3.0)
EOS PCT: 2.9 % (ref 0.0–5.0)
Eosinophils Absolute: 0.2 10*3/uL (ref 0.0–0.7)
HEMATOCRIT: 36 % — AB (ref 39.0–52.0)
Hemoglobin: 11.7 g/dL — ABNORMAL LOW (ref 13.0–17.0)
LYMPHS ABS: 2 10*3/uL (ref 0.7–4.0)
LYMPHS PCT: 26.3 % (ref 12.0–46.0)
MCHC: 32.4 g/dL (ref 30.0–36.0)
MCV: 80.7 fl (ref 78.0–100.0)
MONOS PCT: 7.6 % (ref 3.0–12.0)
Monocytes Absolute: 0.6 10*3/uL (ref 0.1–1.0)
NEUTROS PCT: 62.7 % (ref 43.0–77.0)
Neutro Abs: 4.7 10*3/uL (ref 1.4–7.7)
Platelets: 166 10*3/uL (ref 150.0–400.0)
RBC: 4.46 Mil/uL (ref 4.22–5.81)
RDW: 16.9 % — ABNORMAL HIGH (ref 11.5–15.5)
WBC: 7.4 10*3/uL (ref 4.0–10.5)

## 2017-06-02 LAB — TSH: TSH: 7.83 u[IU]/mL — ABNORMAL HIGH (ref 0.35–4.50)

## 2017-06-02 LAB — URINALYSIS, ROUTINE W REFLEX MICROSCOPIC
BILIRUBIN URINE: NEGATIVE
HGB URINE DIPSTICK: NEGATIVE
KETONES UR: NEGATIVE
LEUKOCYTES UA: NEGATIVE
NITRITE: NEGATIVE
PH: 6 (ref 5.0–8.0)
RBC / HPF: NONE SEEN (ref 0–?)
Specific Gravity, Urine: 1.015 (ref 1.000–1.030)
Total Protein, Urine: NEGATIVE
UROBILINOGEN UA: 0.2 (ref 0.0–1.0)
Urine Glucose: NEGATIVE

## 2017-06-02 LAB — HEPATIC FUNCTION PANEL
ALBUMIN: 3.9 g/dL (ref 3.5–5.2)
ALK PHOS: 86 U/L (ref 39–117)
ALT: 22 U/L (ref 0–53)
AST: 19 U/L (ref 0–37)
Bilirubin, Direct: 0.1 mg/dL (ref 0.0–0.3)
TOTAL PROTEIN: 6.6 g/dL (ref 6.0–8.3)
Total Bilirubin: 0.4 mg/dL (ref 0.2–1.2)

## 2017-06-02 LAB — MICROALBUMIN / CREATININE URINE RATIO
CREATININE, U: 64.2 mg/dL
Microalb Creat Ratio: 1.1 mg/g (ref 0.0–30.0)

## 2017-06-02 LAB — HEMOGLOBIN A1C: Hgb A1c MFr Bld: 6.4 % (ref 4.6–6.5)

## 2017-06-02 LAB — PSA: PSA: 0.47 ng/mL (ref 0.10–4.00)

## 2017-06-02 MED ORDER — LEVOTHYROXINE SODIUM 25 MCG PO TABS: 25.0000 ug | ORAL_TABLET | Freq: Every day | ORAL | 3 refills | Status: DC

## 2017-06-02 NOTE — Patient Instructions (Addendum)
You had the Pneumovax pneumonia shot today  Please continue all other medications as before, and refills have been done if requested.  Please have the pharmacy call with any other refills you may need.  Please continue your efforts at being more active, low cholesterol diet, and weight control.  You are otherwise up to date with prevention measures today.  Please keep your appointments with your specialists as you may have planned  Please go to the LAB in the Basement (turn left off the elevator) for the tests to be done today  You will be contacted by phone if any changes need to be made immediately.  Otherwise, you will receive a letter about your results with an explanation, but please check with MyChart first.  Please remember to sign up for MyChart if you have not done so, as this will be important to you in the future with finding out test results, communicating by private email, and scheduling acute appointments online when needed.  Please return in 6 months, or sooner if needed, with Lab testing done 3-5 days before    
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
## 2017-06-03 DIAGNOSIS — Z0100 Encounter for examination of eyes and vision without abnormal findings: Secondary | ICD-10-CM | POA: Diagnosis not present

## 2017-06-04 ENCOUNTER — Encounter: Payer: Self-pay | Admitting: Internal Medicine

## 2017-06-04 NOTE — Assessment & Plan Note (Signed)

## 2017-06-04 NOTE — Assessment & Plan Note (Signed)
Lab Results  Component Value Date   HGBA1C 6.4 06/02/2017  stable overall by history and exam, recent data reviewed with pt, and pt to continue medical treatment as before,  to f/u any worsening symptoms or concerns

## 2017-06-05 ENCOUNTER — Telehealth: Payer: Self-pay

## 2017-06-05 DIAGNOSIS — E1100 Type 2 diabetes mellitus with hyperosmolarity without nonketotic hyperglycemic-hyperosmolar coma (NKHHC): Secondary | ICD-10-CM

## 2017-06-05 NOTE — Telephone Encounter (Signed)
Pt has been informed and expressed understanding.  

## 2017-06-05 NOTE — Telephone Encounter (Signed)
-----   Message from Biagio Borg, MD sent at 06/02/2017  6:31 PM EST ----- Left message on MyChart, pt to cont same tx except  The test results show that your current treatment is OK, except there appears to be mild low thyroid.  We should start a low dose thyroid medication,  I will send the prescription, and you should hear from the office.     Sh[place] to please inform pt, I will do rx

## 2017-06-06 ENCOUNTER — Other Ambulatory Visit: Payer: Self-pay

## 2017-06-06 DIAGNOSIS — K2270 Barrett's esophagus without dysplasia: Secondary | ICD-10-CM

## 2017-06-06 MED ORDER — BLOOD GLUCOSE MONITOR KIT: PACK | 0 refills | Status: DC

## 2017-06-06 MED ORDER — TAMSULOSIN HCL 0.4 MG PO CAPS: ORAL_CAPSULE | ORAL | 1 refills | Status: DC

## 2017-06-06 NOTE — Telephone Encounter (Signed)
It was sent to his mail order by PCP

## 2017-06-06 NOTE — Telephone Encounter (Signed)
Pt went to [facility] to get the thyroid medication and this has not been called in.

## 2017-06-08 ENCOUNTER — Other Ambulatory Visit: Payer: Self-pay | Admitting: Internal Medicine

## 2017-06-08 DIAGNOSIS — Z8041 Family history of malignant neoplasm of ovary: Secondary | ICD-10-CM | POA: Diagnosis not present

## 2017-06-08 DIAGNOSIS — Z1371 Encounter for nonprocreative screening for genetic disease carrier status: Secondary | ICD-10-CM | POA: Diagnosis not present

## 2017-06-08 DIAGNOSIS — Z1509 Genetic susceptibility to other malignant neoplasm: Secondary | ICD-10-CM | POA: Diagnosis not present

## 2017-06-08 DIAGNOSIS — Z1501 Genetic susceptibility to malignant neoplasm of breast: Secondary | ICD-10-CM | POA: Diagnosis not present

## 2017-06-08 DIAGNOSIS — Z809 Family history of malignant neoplasm, unspecified: Secondary | ICD-10-CM | POA: Diagnosis not present

## 2017-06-08 DIAGNOSIS — Z800 Family history of malignant neoplasm of digestive organs: Secondary | ICD-10-CM | POA: Diagnosis not present

## 2017-06-08 DIAGNOSIS — Z86010 Personal history of colonic polyps: Secondary | ICD-10-CM | POA: Diagnosis not present

## 2017-06-08 DIAGNOSIS — Z803 Family history of malignant neoplasm of breast: Secondary | ICD-10-CM | POA: Diagnosis not present

## 2017-06-08 DIAGNOSIS — K635 Polyp of colon: Secondary | ICD-10-CM | POA: Diagnosis not present

## 2017-06-08 MED ORDER — LISINOPRIL 20 MG PO TABS: 20.0000 mg | ORAL_TABLET | Freq: Every day | ORAL | 1 refills | Status: DC

## 2017-06-08 MED ORDER — LEVOTHYROXINE SODIUM 25 MCG PO TABS: 25.0000 ug | ORAL_TABLET | Freq: Every day | ORAL | 0 refills | Status: DC

## 2017-06-08 MED ORDER — PANTOPRAZOLE SODIUM 40 MG PO TBEC: 40.0000 mg | DELAYED_RELEASE_TABLET | Freq: Two times a day (BID) | ORAL | 1 refills | Status: DC

## 2017-06-08 MED ORDER — POTASSIUM CHLORIDE CRYS ER 20 MEQ PO TBCR: 20.0000 meq | EXTENDED_RELEASE_TABLET | Freq: Every day | ORAL | 1 refills | Status: DC

## 2017-06-08 MED ORDER — ONETOUCH ULTRASOFT LANCETS MISC: 12 refills | Status: DC

## 2017-06-08 MED ORDER — GABAPENTIN 300 MG PO CAPS: ORAL_CAPSULE | ORAL | 1 refills | Status: DC

## 2017-06-08 MED ORDER — ONETOUCH ULTRA 2 W/DEVICE KIT: PACK | 0 refills | Status: DC

## 2017-06-08 MED ORDER — CARVEDILOL 12.5 MG PO TABS: ORAL_TABLET | ORAL | 1 refills | Status: DC

## 2017-06-08 MED ORDER — FUROSEMIDE 20 MG PO TABS: 20.0000 mg | ORAL_TABLET | Freq: Two times a day (BID) | ORAL | 1 refills | Status: DC

## 2017-06-08 MED ORDER — FLUOXETINE HCL 40 MG PO CAPS: 40.0000 mg | ORAL_CAPSULE | Freq: Every day | ORAL | 1 refills | Status: DC

## 2017-06-08 MED ORDER — LEVOTHYROXINE SODIUM 25 MCG PO TABS: 25.0000 ug | ORAL_TABLET | Freq: Every day | ORAL | 1 refills | Status: DC

## 2017-06-08 MED ORDER — GLUCOSE BLOOD VI STRP: ORAL_STRIP | 1 refills | Status: DC

## 2017-06-08 NOTE — Telephone Encounter (Addendum)
Pt states the new thyroid med needs to be sent to  [facility] - [address] [phone] (Phone) [phone] (Fax)   Pt no longer uses [facility]. I do not see this was ordered by Dr Ronnald Ramp yet.

## 2017-06-08 NOTE — Telephone Encounter (Signed)
Updated med list. Sent 30 day rx to [facility] for pt levothyroxine. All other maintenance meds sent to [facility]. No refills are due on the Lorazepam. Pt was give rx in Jan w/2 additional refills and they have to be filled at local pharmacy due to changes in [place] opioid law. Can only get 30 day at a time...Johny Chess

## 2017-06-08 NOTE — Addendum Note (Signed)
Addended by: Earnstine Regal on: 06/08/2017 03:02 PM   Modules accepted: Orders

## 2017-06-08 NOTE — Telephone Encounter (Signed)
ALL of his other medication need to be sent to  [facility], [place] (Phone) [phone] (Fax)   carvedilol (COREG) 12.5 MG tablet FLUoxetine (PROZAC) 40 MG capsule  furosemide (LASIX) 20 MG tablet  gabapentin (NEURONTIN) 300 MG capsule  ONE TOUCH ULTRA TEST test strip  Also he can get a new meter  lisinopril (PRINIVIL,ZESTRIL) 20 MG tablet  LORazepam (ATIVAN) 1 MG tablet  pantoprazole (PROTONIX) 40 MG tablet  potassium chloride SA (K-DUR,KLOR-CON) 20 MEQ tablet  tamsulosin (FLOMAX) 0.4 MG CAPS capsule

## 2017-06-15 ENCOUNTER — Other Ambulatory Visit: Payer: Self-pay | Admitting: *Deleted

## 2017-06-15 MED ORDER — ATORVASTATIN CALCIUM 80 MG PO TABS: 80.0000 mg | ORAL_TABLET | Freq: Every day | ORAL | 3 refills | Status: DC

## 2017-06-19 ENCOUNTER — Telehealth: Payer: Self-pay | Admitting: Internal Medicine

## 2017-06-19 DIAGNOSIS — K2270 Barrett's esophagus without dysplasia: Secondary | ICD-10-CM

## 2017-06-19 NOTE — Telephone Encounter (Signed)
Copied from [place] [phone]. Topic: Quick Communication - Rx Refill/Question >> Jun 19, 2017  3:20 PM Oliver Pila B wrote: Medication: tamsulosin (FLOMAX) 0.4 MG CAPS capsule [[id number], BD alcohol swabs Has the patient contacted their pharmacy? Yes.   (Agent: If no, request that the patient contact the pharmacy for the refill.) Preferred Pharmacy (with phone number or street name): [facility] Agent: Please be advised that RX refills may take up to 3 business days. We ask that you follow-up with your pharmacy.

## 2017-06-20 MED ORDER — TAMSULOSIN HCL 0.4 MG PO CAPS: ORAL_CAPSULE | ORAL | 1 refills | Status: DC

## 2017-06-20 NOTE — Telephone Encounter (Signed)
Pt wanting to transfer med tamulosin (Flomax) from [facility] to [facility] (done)

## 2017-06-23 DIAGNOSIS — H2512 Age-related nuclear cataract, left eye: Secondary | ICD-10-CM | POA: Diagnosis not present

## 2017-06-23 DIAGNOSIS — Z01818 Encounter for other preprocedural examination: Secondary | ICD-10-CM | POA: Diagnosis not present

## 2017-06-26 ENCOUNTER — Encounter: Payer: Self-pay | Admitting: Physician Assistant

## 2017-06-26 ENCOUNTER — Ambulatory Visit (INDEPENDENT_AMBULATORY_CARE_PROVIDER_SITE_OTHER): Payer: Medicare HMO | Admitting: Physician Assistant

## 2017-06-26 ENCOUNTER — Other Ambulatory Visit: Payer: Self-pay

## 2017-06-26 ENCOUNTER — Ambulatory Visit: Payer: Self-pay

## 2017-06-26 VITALS — BP 122/70 | HR 62 | Temp 98.6°F | Resp 16 | Ht 70.0 in | Wt 265.0 lb

## 2017-06-26 DIAGNOSIS — B029 Zoster without complications: Secondary | ICD-10-CM | POA: Diagnosis not present

## 2017-06-26 MED ORDER — VALACYCLOVIR HCL 1 G PO TABS: 1000.0000 mg | ORAL_TABLET | Freq: Two times a day (BID) | ORAL | 0 refills | Status: DC

## 2017-06-26 MED ORDER — HYDROCODONE-ACETAMINOPHEN 5-325 MG PO TABS: 1.0000 | ORAL_TABLET | Freq: Four times a day (QID) | ORAL | 0 refills | Status: DC | PRN

## 2017-06-26 MED ORDER — LIDOCAINE 4 % EX CREA: 1.0000 "application " | TOPICAL_CREAM | CUTANEOUS | 0 refills | Status: DC | PRN
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
## 2017-06-26 NOTE — Patient Instructions (Signed)
Please keep skin clean and dry.  Apply the lidocaine ointment to the area with a glove to help numb it.  Take the hydrocodone as directed for severe pain only.  Take the Valtrex as directed.  Keep the rash covered.  Avoid the young, elderly and pregnant women as you can transmit this to them as chickenpox.

## 2017-06-26 NOTE — Telephone Encounter (Signed)
Pt. C/o painful, itchy rash x 3 days.States rash is on his right side and goes around to his back and his front (trunk). "I need to get some relief. I can't sleep and I'm hurting real bad." Appointment for today made.  Reason for Disposition . [1] Shingles rash (matches SYMPTOMS) AND [2] onset within past 72 hours  Answer Assessment - Initial Assessment Questions 1. APPEARANCE of RASH: "Describe the rash."      Red, hot itchy 2. LOCATION: "Where is the rash located?"      Right side around to his back 3. ONSET: "When did the rash start?"      3 days ago 4. ITCHING: "Does the rash itch?" If so, ask: "How bad is the itch?"  (Scale 1-10; or mild, moderate, severe)     Itchy 5. PAIN: "Does the rash hurt?" If so, ask: "How bad is the pain?"  (Scale 1-10; or mild, moderate, severe)      Hurts  6. OTHER SYMPTOMS: "Do you have any other symptoms?" (e.g., fever)     Fever, nausea 7. PREGNANCY: "Is there any chance you are pregnant?" "When was your last menstrual period?"     No  Protocols used: [facility]

## 2017-06-28 ENCOUNTER — Ambulatory Visit: Payer: Medicare HMO | Admitting: Internal Medicine

## 2017-07-06 ENCOUNTER — Other Ambulatory Visit: Payer: Self-pay | Admitting: Physician Assistant

## 2017-07-06 ENCOUNTER — Telehealth: Payer: Self-pay | Admitting: Internal Medicine

## 2017-07-06 DIAGNOSIS — B029 Zoster without complications: Secondary | ICD-10-CM

## 2017-07-06 MED ORDER — HYDROCODONE-ACETAMINOPHEN 5-325 MG PO TABS: 1.0000 | ORAL_TABLET | Freq: Four times a day (QID) | ORAL | 0 refills | Status: DC | PRN

## 2017-07-06 NOTE — Telephone Encounter (Signed)
Called pt. to discuss status of shingles. Stated it started in the abdomen near the navel and wrapped around to his back, and now has spread to his shoulders.  reported there has been some weeping from the rash, but it has stopped.  Reported about 20 % of rash is starting to form scabs.  Stated has intermittent chills. Stated the only way he can get relief from the pain is the Hydrocodone/ Acetaminophen.  Stated is out of his medication.  Will make PCP aware.    Refill request on Valtrex and Norco; last RF was 06/26/17 Last OV 06/26/17 with Elyn Aquas @ [facility] [place] PCP Hadassah Pais in [place].   Rx's are pending.

## 2017-07-06 NOTE — Telephone Encounter (Signed)
Copied from [place] [id number]. Topic: Quick Communication - Rx Refill/Question >> Jul 06, 2017 11:22 AM Steven Charles wrote: Medication: valACYclovir (VALTREX) 1000 MG tablet    HYDROcodone-acetaminophen (NORCO) 5-325 MG tablet--------Steven Charles gave pt these medications  Has the patient contacted their pharmacy? Yes.   (Agent: If no, request that the patient contact the pharmacy for the refill.) Preferred Pharmacy (with phone number or street name): [facility], [place] - [address] [place] [address] [place] [address] [phone] (Phone) [phone] (Fax)  Patient saw Elyn Aquas at [facility] for these medicines for Shingles   Agent: Please be advised that RX refills may take up to 3 business days. We ask that you follow-up with your pharmacy.

## 2017-07-06 NOTE — Telephone Encounter (Signed)
[place] for hydrocodone refill only, as further valtrex will not help

## 2017-07-07 NOTE — Telephone Encounter (Signed)
Called pt no answer LMOM w/MD response../lmb 

## 2017-08-25 ENCOUNTER — Other Ambulatory Visit: Payer: Self-pay | Admitting: Internal Medicine

## 2017-08-28 NOTE — Telephone Encounter (Signed)
Done erx 

## 2017-08-28 NOTE — Telephone Encounter (Signed)
07/08/2017 90# 

## 2017-09-11 ENCOUNTER — Ambulatory Visit (INDEPENDENT_AMBULATORY_CARE_PROVIDER_SITE_OTHER): Payer: Medicare HMO | Admitting: Internal Medicine

## 2017-09-11 ENCOUNTER — Encounter: Payer: Self-pay | Admitting: Internal Medicine

## 2017-09-11 VITALS — BP 128/86 | HR 83 | Temp 98.3°F | Ht 70.0 in | Wt 267.0 lb

## 2017-09-11 DIAGNOSIS — E11649 Type 2 diabetes mellitus with hypoglycemia without coma: Secondary | ICD-10-CM | POA: Diagnosis not present

## 2017-09-11 DIAGNOSIS — B029 Zoster without complications: Secondary | ICD-10-CM | POA: Diagnosis not present

## 2017-09-11 DIAGNOSIS — I10 Essential (primary) hypertension: Secondary | ICD-10-CM

## 2017-09-11 DIAGNOSIS — B0229 Other postherpetic nervous system involvement: Secondary | ICD-10-CM

## 2017-09-11 MED ORDER — LIDOCAINE 5 % EX PTCH: 1.0000 | MEDICATED_PATCH | CUTANEOUS | 1 refills | Status: DC

## 2017-09-11 MED ORDER — GABAPENTIN 300 MG PO CAPS: ORAL_CAPSULE | ORAL | 1 refills | Status: DC

## 2017-09-11 MED ORDER — LIDOCAINE 4 % EX CREA: 1.0000 "application " | TOPICAL_CREAM | CUTANEOUS | 1 refills | Status: DC | PRN

## 2017-09-11 MED ORDER — HYDROCODONE-ACETAMINOPHEN 5-325 MG PO TABS: 1.0000 | ORAL_TABLET | Freq: Four times a day (QID) | ORAL | 0 refills | Status: DC | PRN

## 2017-09-11 NOTE — Assessment & Plan Note (Signed)
Ok for lidocaine cream or lidoderm use as needed, refill vicodin prn limited rx, and increased gabapentin to 300 bid

## 2017-09-11 NOTE — Patient Instructions (Addendum)
Please take all new medication as prescribed - the lidocaine cream OR the lidoderm patches  OK to increase the gabapentin to 300 mg three times per day  Please continue all other medications as before, and refills have been done if requested - the hydrocodone  Please have the pharmacy call with any other refills you may need.  Please keep your appointments with your specialists as you may have planned
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
## 2017-09-11 NOTE — Assessment & Plan Note (Signed)
stable overall by history and exam, recent data reviewed with pt, and pt to continue medical treatment as before,  to f/u any worsening symptoms or concerns Lab Results  Component Value Date   HGBA1C 6.4 06/02/2017

## 2017-09-11 NOTE — Assessment & Plan Note (Signed)
stable overall by history and exam, recent data reviewed with pt, and pt to continue medical treatment as before,  to f/u any worsening symptoms or concerns BP Readings from Last 3 Encounters:  09/11/17 128/86  06/26/17 122/70  06/02/17 124/80

## 2017-10-30 DIAGNOSIS — H25812 Combined forms of age-related cataract, left eye: Secondary | ICD-10-CM | POA: Diagnosis not present

## 2017-10-30 DIAGNOSIS — H2512 Age-related nuclear cataract, left eye: Secondary | ICD-10-CM | POA: Diagnosis not present

## 2017-10-30 DIAGNOSIS — Z01818 Encounter for other preprocedural examination: Secondary | ICD-10-CM | POA: Diagnosis not present
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
## 2017-11-03 ENCOUNTER — Ambulatory Visit (INDEPENDENT_AMBULATORY_CARE_PROVIDER_SITE_OTHER): Payer: Medicare HMO | Admitting: *Deleted

## 2017-11-03 VITALS — BP 120/66 | HR 63 | Resp 19 | Ht 70.0 in | Wt 258.0 lb

## 2017-11-03 DIAGNOSIS — Z0000 Encounter for general adult medical examination without abnormal findings: Secondary | ICD-10-CM | POA: Diagnosis not present

## 2017-11-03 NOTE — Patient Instructions (Addendum)
[email or web address] or down load app on smart phone  Aunt Berenice Primas website lists multiple social resources for individuals such as: food, health, money, house hold goods, transit, medical supplies, job training and legal services.  Continue doing brain stimulating activities (puzzles, reading, adult coloring books, staying active) to keep memory sharp.   Continue to eat heart healthy diet (full of fruits, vegetables, whole grains, lean protein, water--limit salt, fat, and sugar intake) and increase physical activity as tolerated.   Steven Charles , Thank you for taking time to come for your Medicare Wellness Visit. I appreciate your ongoing commitment to your health goals. Please review the following plan we discussed and let me know if I can assist you in the future.   These are the goals we discussed: Goals      Patient Stated   . patient (pt-stated)     Would like for left knee to feel better;        Other   . Continue to improve my mobility, work with physical therapy to become as independent as possible    . Patient Stated     Stay as independent as possible by continuing to keep my strength by moving.       This is a list of the screening recommended for you and due dates:  Health Maintenance  Topic Date Due  . Flu Shot  10/26/2017  . Hemoglobin A1C  12/03/2017  . Tetanus Vaccine  01/24/2018  . Eye exam for diabetics  06/02/2018  . Complete foot exam   06/03/2018  .  Hepatitis C: One time screening is recommended by Center for Disease Control  (CDC) for  adults born from [birth year] through 1967.   Completed  . Pneumonia vaccines  Completed       Health Maintenance, Male A healthy lifestyle and preventive care is important for your health and wellness. Ask your health care provider about what schedule of regular examinations is right for you. What should I know about weight and diet? Eat a Healthy Diet  Eat plenty of vegetables, fruits, whole grains, Charles-fat dairy products, and  lean protein.  Do not eat a lot of foods high in solid fats, added sugars, or salt.  Maintain a Healthy Weight Regular exercise can help you achieve or maintain a healthy weight. You should:  Do at least 150 minutes of exercise each week. The exercise should increase your heart rate and make you sweat (moderate-intensity exercise).  Do strength-training exercises at least twice a week.  Watch Your Levels of Cholesterol and Blood Lipids  Have your blood tested for lipids and cholesterol every 5 years starting at 74 years of age. If you are at high risk for heart disease, you should start having your blood tested when you are 74 years old. You may need to have your cholesterol levels checked more often if: ? Your lipid or cholesterol levels are high. ? You are older than 74 years of age. ? You are at high risk for heart disease.  What should I know about cancer screening? Many types of cancers can be detected early and may often be prevented. Lung Cancer  You should be screened every year for lung cancer if: ? You are a current smoker who has smoked for at least 30 years. ? You are a former smoker who has quit within the past 15 years.  Talk to your health care provider about your screening options, when you should start screening, and how often you should  be screened.  Colorectal Cancer  Routine colorectal cancer screening usually begins at 74 years of age and should be repeated every 5-10 years until you are 74 years old. You may need to be screened more often if early forms of precancerous polyps or small growths are found. Your health care provider may recommend screening at an earlier age if you have risk factors for colon cancer.  Your health care provider may recommend using home test kits to check for hidden blood in the stool.  A small camera at the end of a tube can be used to examine your colon (sigmoidoscopy or colonoscopy). This checks for the earliest forms of colorectal  cancer.  Prostate and Testicular Cancer  Depending on your age and overall health, your health care provider may do certain tests to screen for prostate and testicular cancer.  Talk to your health care provider about any symptoms or concerns you have about testicular or prostate cancer.  Skin Cancer  Check your skin from head to toe regularly.  Tell your health care provider about any new moles or changes in moles, especially if: ? There is a change in a mole's size, shape, or color. ? You have a mole that is larger than a pencil eraser.  Always use sunscreen. Apply sunscreen liberally and repeat throughout the day.  Protect yourself by wearing long sleeves, pants, a wide-brimmed hat, and sunglasses when outside.  What should I know about heart disease, diabetes, and high blood pressure?  If you are 57-7 years of age, have your blood pressure checked every 3-5 years. If you are 15 years of age or older, have your blood pressure checked every year. You should have your blood pressure measured twice-once when you are at a hospital or clinic, and once when you are not at a hospital or clinic. Record the average of the two measurements. To check your blood pressure when you are not at a hospital or clinic, you can use: ? An automated blood pressure machine at a pharmacy. ? A home blood pressure monitor.  Talk to your health care provider about your target blood pressure.  If you are between 7-98 years old, ask your health care provider if you should take aspirin to prevent heart disease.  Have regular diabetes screenings by checking your fasting blood sugar level. ? If you are at a normal weight and have a Charles risk for diabetes, have this test once every three years after the age of 59. ? If you are overweight and have a high risk for diabetes, consider being tested at a younger age or more often.  A one-time screening for abdominal aortic aneurysm (AAA) by ultrasound is recommended  for men aged 49-75 years who are current or former smokers. What should I know about preventing infection? Hepatitis B If you have a higher risk for hepatitis B, you should be screened for this virus. Talk with your health care provider to find out if you are at risk for hepatitis B infection. Hepatitis C Blood testing is recommended for:  Everyone born from [birth year] through 1967.  Anyone with known risk factors for hepatitis C.  Sexually Transmitted Diseases (STDs)  You should be screened each year for STDs including gonorrhea and chlamydia if: ? You are sexually active and are younger than 74 years of age. ? You are older than 74 years of age and your health care provider tells you that you are at risk for this type of infection. ? Your  sexual activity has changed since you were last screened and you are at an increased risk for chlamydia or gonorrhea. Ask your health care provider if you are at risk.  Talk with your health care provider about whether you are at high risk of being infected with HIV. Your health care provider may recommend a prescription medicine to help prevent HIV infection.  What else can I do?  Schedule regular health, dental, and eye exams.  Stay current with your vaccines (immunizations).  Do not use any tobacco products, such as cigarettes, chewing tobacco, and e-cigarettes. If you need help quitting, ask your health care provider.  Limit alcohol intake to no more than 2 drinks per day. One drink equals 12 ounces of beer, 5 ounces of wine, or 1 ounces of hard liquor.  Do not use street drugs.  Do not share needles.  Ask your health care provider for help if you need support or information about quitting drugs.  Tell your health care provider if you often feel depressed.  Tell your health care provider if you have ever been abused or do not feel safe at home. This information is not intended to replace advice given to you by your health care provider. Make  sure you discuss any questions you have with your health care provider. Document Released: 09/10/2007 Document Revised: 11/11/2015 Document Reviewed: 12/16/2014 Elsevier Interactive Patient Education  [employer].

## 2017-11-07 DIAGNOSIS — Z89612 Acquired absence of left leg above knee: Secondary | ICD-10-CM | POA: Diagnosis not present

## 2017-11-13 DIAGNOSIS — H2511 Age-related nuclear cataract, right eye: Secondary | ICD-10-CM | POA: Diagnosis not present

## 2017-11-13 DIAGNOSIS — H25811 Combined forms of age-related cataract, right eye: Secondary | ICD-10-CM | POA: Diagnosis not present

## 2017-12-04 ENCOUNTER — Other Ambulatory Visit: Payer: Self-pay | Admitting: Internal Medicine

## 2017-12-06 ENCOUNTER — Ambulatory Visit (INDEPENDENT_AMBULATORY_CARE_PROVIDER_SITE_OTHER): Payer: Medicare HMO | Admitting: Internal Medicine

## 2017-12-06 ENCOUNTER — Other Ambulatory Visit (INDEPENDENT_AMBULATORY_CARE_PROVIDER_SITE_OTHER): Payer: Medicare HMO

## 2017-12-06 ENCOUNTER — Encounter: Payer: Self-pay | Admitting: Internal Medicine

## 2017-12-06 VITALS — BP 124/86 | HR 67 | Temp 97.9°F | Ht 70.0 in | Wt 267.0 lb

## 2017-12-06 DIAGNOSIS — Z0000 Encounter for general adult medical examination without abnormal findings: Secondary | ICD-10-CM | POA: Diagnosis not present

## 2017-12-06 DIAGNOSIS — E039 Hypothyroidism, unspecified: Secondary | ICD-10-CM | POA: Insufficient documentation

## 2017-12-06 DIAGNOSIS — Z89612 Acquired absence of left leg above knee: Secondary | ICD-10-CM | POA: Diagnosis not present

## 2017-12-06 DIAGNOSIS — E785 Hyperlipidemia, unspecified: Secondary | ICD-10-CM | POA: Diagnosis not present

## 2017-12-06 DIAGNOSIS — I509 Heart failure, unspecified: Secondary | ICD-10-CM | POA: Diagnosis not present

## 2017-12-06 DIAGNOSIS — Z23 Encounter for immunization: Secondary | ICD-10-CM

## 2017-12-06 DIAGNOSIS — I10 Essential (primary) hypertension: Secondary | ICD-10-CM | POA: Diagnosis not present

## 2017-12-06 DIAGNOSIS — E11649 Type 2 diabetes mellitus with hypoglycemia without coma: Secondary | ICD-10-CM | POA: Diagnosis not present

## 2017-12-06 HISTORY — DX: Hypothyroidism, unspecified: E03.9

## 2017-12-06 LAB — LIPID PANEL
CHOL/HDL RATIO: 4
CHOLESTEROL: 142 mg/dL (ref 0–200)
HDL: 37.5 mg/dL — ABNORMAL LOW (ref >39.00)
LDL Cholesterol: 67 mg/dL (ref 0–99)
NonHDL: 104.06
TRIGLYCERIDES: 187 mg/dL — AB (ref 0.0–149.0)
VLDL: 37.4 mg/dL (ref 0.0–40.0)

## 2017-12-06 LAB — BASIC METABOLIC PANEL
BUN: 18 mg/dL (ref 6–23)
CHLORIDE: 104 meq/L (ref 96–112)
CO2: 29 mEq/L (ref 19–32)
Calcium: 10 mg/dL (ref 8.4–10.5)
Creatinine, Ser: 1.4 mg/dL (ref 0.40–1.50)
GFR: 52.61 mL/min — ABNORMAL LOW (ref >60.00)
GLUCOSE: 97 mg/dL (ref 70–99)
Potassium: 4.8 mEq/L (ref 3.5–5.1)
SODIUM: 139 meq/L (ref 135–145)

## 2017-12-06 LAB — HEPATIC FUNCTION PANEL
ALT: 20 U/L (ref 0–53)
AST: 19 U/L (ref 0–37)
Albumin: 3.9 g/dL (ref 3.5–5.2)
Alkaline Phosphatase: 93 U/L (ref 39–117)
BILIRUBIN TOTAL: 0.7 mg/dL (ref 0.2–1.2)
Bilirubin, Direct: 0.2 mg/dL (ref 0.0–0.3)
Total Protein: 7.1 g/dL (ref 6.0–8.3)

## 2017-12-06 LAB — TSH: TSH: 9.95 u[IU]/mL — ABNORMAL HIGH (ref 0.35–4.50)

## 2017-12-06 LAB — HEMOGLOBIN A1C: Hgb A1c MFr Bld: 6.1 % (ref 4.6–6.5)

## 2017-12-06 NOTE — Assessment & Plan Note (Signed)
stable overall by history and exam, recent data reviewed with pt, and pt to continue medical treatment as before,  to f/u any worsening symptoms or concerns, for f/u labs 

## 2017-12-06 NOTE — Assessment & Plan Note (Signed)
stable overall by history and exam, recent data reviewed with pt, and pt to continue medical treatment as before,  to f/u any worsening symptoms or concerns  

## 2017-12-06 NOTE — Patient Instructions (Addendum)
You had the flu shot today  Please continue all other medications as before, and refills have been done if requested.  Please have the pharmacy call with any other refills you may need.  Please continue your efforts at being more active, low cholesterol diet, and weight control.  Please keep your appointments with your specialists as you may have planned  Please go to the LAB in the Basement (turn left off the elevator) for the tests to be done today  You will be contacted by phone if any changes need to be made immediately.  Otherwise, you will receive a letter about your results with an explanation, but please check with MyChart first.  Please remember to sign up for MyChart if you have not done so, as this will be important to you in the future with finding out test results, communicating by private email, and scheduling acute appointments online when needed.  Please return in 6 months, or sooner if needed, with Lab testing done 3-5 days before

## 2017-12-06 NOTE — Assessment & Plan Note (Signed)
On new low dose replacement, for f/u lab today

## 2017-12-06 NOTE — Assessment & Plan Note (Signed)
stable overall by history and exam, recent data reviewed with pt, and pt to continue medical treatment as before,  to f/u any worsening symptoms or concerns, for f/u lab 
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
## 2017-12-07 ENCOUNTER — Other Ambulatory Visit: Payer: Self-pay | Admitting: Internal Medicine

## 2017-12-07 ENCOUNTER — Encounter: Payer: Self-pay | Admitting: Internal Medicine

## 2017-12-07 ENCOUNTER — Telehealth: Payer: Self-pay

## 2017-12-07 MED ORDER — LEVOTHYROXINE SODIUM 50 MCG PO TABS: 50.0000 ug | ORAL_TABLET | Freq: Every day | ORAL | 3 refills | Status: DC

## 2017-12-07 NOTE — Telephone Encounter (Signed)
Called pt, LVM.   CRM created.  

## 2017-12-07 NOTE — Telephone Encounter (Signed)
-----   Message from Biagio Borg, MD sent at 12/07/2017  1:05 PM EDT ----- Letter sent, cont same tx except  The test results show that your current treatment is OK, except the thyroid testing shows the thyroid is still a bit slow.  We need to increase the levothyroxine from 25 to 50 mcg per day.  I will send a new prescription, and you should hear from the office as well   Neave Lenger to please inform pt, I will do rx

## 2017-12-20 ENCOUNTER — Telehealth: Payer: Self-pay | Admitting: Internal Medicine

## 2017-12-20 ENCOUNTER — Other Ambulatory Visit: Payer: Self-pay

## 2017-12-20 MED ORDER — CARVEDILOL 12.5 MG PO TABS: ORAL_TABLET | ORAL | 1 refills | Status: DC

## 2017-12-20 NOTE — Telephone Encounter (Signed)
Copied from [place] [id number]. Topic: Quick Communication - See Telephone Encounter >> Dec 20, 2017  1:19 PM Conception Chancy, NT wrote: CRM for notification. See Telephone encounter for: 12/20/17.  Patient is calling and requesting a refill on carvedilol (COREG) 12.5 MG tablet.  [facility] [place], [place] - [address] [place] [address] [place] [address] Phone: [phone] Fax: [phone]

## 2017-12-23 ENCOUNTER — Other Ambulatory Visit: Payer: Self-pay | Admitting: Internal Medicine

## 2018-01-02 ENCOUNTER — Telehealth: Payer: Self-pay

## 2018-01-02 NOTE — Telephone Encounter (Signed)
[place] for letter - to sh[place]ron to help

## 2018-01-02 NOTE — Telephone Encounter (Signed)
Copied from [place] [id number]. Topic: Quick Communication - See Telephone Encounter >> Jan 02, 2018  2:44 PM Hewitt Shorts wrote: Pt is needing to talk with Dr. Jenny Reichmann about writing a letter about pt have dementia and  did notr understand what he was signing up for helping him to get out a magazine prescription   Best number [phone]

## 2018-01-03 NOTE — Telephone Encounter (Signed)
Dr. Jenny Reichmann after reviewing his chart I do not see dementia or memory loss for a diagnosis for this patient. Please advise.

## 2018-01-03 NOTE — Telephone Encounter (Signed)
Sorry, this was an error.  I cannot state that he has dementia or memory loss. thanks

## 2018-01-04 NOTE — Telephone Encounter (Signed)
Pt has been informed and expressed understanding.  

## 2018-01-11 ENCOUNTER — Other Ambulatory Visit: Payer: Self-pay | Admitting: Internal Medicine

## 2018-01-11 MED ORDER — PANTOPRAZOLE SODIUM 40 MG PO TBEC: 40.0000 mg | DELAYED_RELEASE_TABLET | Freq: Two times a day (BID) | ORAL | 2 refills | Status: DC

## 2018-01-11 NOTE — Telephone Encounter (Signed)
Copied from [place] [phone]. Topic: General - Other >> Jan 11, 2018 11:32 AM Janace Aris A wrote: Medication:  pantoprazole (PROTONIX) 40 MG tablet   Has the patient contacted their pharmacy? Yes (pt is currently out of medication)  Preferred Pharmacy (with phone number or street name):  [facility], [place] - [address] [place] [address] [phone] (Phone) [phone] (Fax)    Agent: Please be advised that RX refills may take up to 3 business days. We ask that you follow-up with your pharmacy.

## 2018-01-19 ENCOUNTER — Other Ambulatory Visit: Payer: Self-pay | Admitting: Internal Medicine

## 2018-01-19 MED ORDER — LORAZEPAM 1 MG PO TABS: 1.0000 mg | ORAL_TABLET | Freq: Three times a day (TID) | ORAL | 0 refills | Status: DC | PRN

## 2018-01-19 NOTE — Telephone Encounter (Signed)
Copied from [place] [id number]. Topic: Quick Communication - Rx Refill/Question >> Jan 19, 2018 10:50 AM Alfredia Ferguson R wrote: Medication:LORazepam (ATIVAN) 1 MG tablet Has the patient contacted their pharmacy? Yes  Preferred Pharmacy (with phone number or street name): [facility], [place] - [address] [place] [address] [place] [address] [phone] (Phone) [phone] (Fax)    Agent: Please be advised that RX refills may take up to 3 business days. We ask that you follow-up with your pharmacy.

## 2018-01-19 NOTE — Telephone Encounter (Signed)
Please advise per Dr. Gwynn Burly absence. Thank you  Control database checked last refill: 12/05/2017 LOV: 12/06/2017 NOV:06/06/2018

## 2018-01-31 ENCOUNTER — Other Ambulatory Visit: Payer: Self-pay | Admitting: Internal Medicine

## 2018-03-02 ENCOUNTER — Other Ambulatory Visit: Payer: Self-pay | Admitting: Internal Medicine

## 2018-03-02 NOTE — Telephone Encounter (Signed)
Done erx 
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
## 2018-03-26 ENCOUNTER — Ambulatory Visit (HOSPITAL_COMMUNITY)
Admission: RE | Admit: 2018-03-26 | Discharge: 2018-03-26 | Disposition: A | Discharge status: Home / Self Care | Payer: Medicare HMO | Source: Ambulatory Visit | Attending: Cardiology | Admitting: Cardiology

## 2018-03-26 ENCOUNTER — Encounter: Payer: Self-pay | Admitting: Cardiology

## 2018-03-26 ENCOUNTER — Ambulatory Visit (INDEPENDENT_AMBULATORY_CARE_PROVIDER_SITE_OTHER): Payer: Medicare HMO | Admitting: Cardiology

## 2018-03-26 VITALS — BP 130/62 | HR 69 | Ht 70.0 in | Wt 267.5 lb

## 2018-03-26 DIAGNOSIS — E785 Hyperlipidemia, unspecified: Secondary | ICD-10-CM | POA: Diagnosis not present

## 2018-03-26 DIAGNOSIS — I6523 Occlusion and stenosis of bilateral carotid arteries: Secondary | ICD-10-CM

## 2018-03-26 DIAGNOSIS — I25810 Atherosclerosis of coronary artery bypass graft(s) without angina pectoris: Secondary | ICD-10-CM | POA: Diagnosis not present

## 2018-03-26 DIAGNOSIS — I10 Essential (primary) hypertension: Secondary | ICD-10-CM | POA: Diagnosis not present

## 2018-03-26 NOTE — Patient Instructions (Signed)
Medication Instructions:  NO CHANGE If you need a refill on your cardiac medications before your next appointment, please call your pharmacy.   Lab work: Your physician recommends that you HAVE LAB WORK TODAY If you have labs (blood work) drawn today and your tests are completely normal, you will receive your results only by: Marland Kitchen MyChart Message (if you have MyChart) OR . A paper copy in the mail If you have any lab test that is abnormal or we need to change your treatment, we will call you to review the results.  Follow-Up: At [facility], you and your health needs are our priority.  As part of our continuing mission to provide you with exceptional heart care, we have created designated Provider Care Teams.  These Care Teams include your primary Cardiologist (physician) and Advanced Practice Providers (APPs -  Physician Assistants and Nurse Practitioners) who all work together to provide you with the care you need, when you need it. You will need a follow up appointment in 12 months.  Please call our office 2 months in advance to schedule this appointment.  You may see [name] MD or one of the following Advanced Practice Providers on your designated Care Team:   Kerin Ransom, PA-C Roby Lofts, [place] . Sande Rives, PA-C  CALL IN September FOR AN APPOINTMENT IN [place]

## 2018-03-27 LAB — LIPID PANEL
Chol/HDL Ratio: 4.1 ratio (ref 0.0–5.0)
Cholesterol, Total: 155 mg/dL (ref 100–199)
HDL: 38 mg/dL — ABNORMAL LOW (ref >39)
LDL Calculated: 91 mg/dL (ref 0–99)
Triglycerides: 130 mg/dL (ref 0–149)
VLDL Cholesterol Cal: 26 mg/dL (ref 5–40)

## 2018-03-27 LAB — COMPREHENSIVE METABOLIC PANEL
ALT: 20 IU/L (ref 0–44)
AST: 19 IU/L (ref 0–40)
Albumin/Globulin Ratio: 1.4 (ref 1.2–2.2)
Albumin: 4 g/dL (ref 3.5–4.8)
Alkaline Phosphatase: 110 IU/L (ref 39–117)
BUN/Creatinine Ratio: 14 (ref 10–24)
BUN: 20 mg/dL (ref 8–27)
Bilirubin Total: 0.5 mg/dL (ref 0.0–1.2)
CALCIUM: 10.2 mg/dL (ref 8.6–10.2)
CO2: 20 mmol/L (ref 20–29)
Chloride: 105 mmol/L (ref 96–106)
Creatinine, Ser: 1.43 mg/dL — ABNORMAL HIGH (ref 0.76–1.27)
GFR calc non Af Amer: 48 mL/min/{1.73_m2} — ABNORMAL LOW (ref >59)
GFR, EST AFRICAN AMERICAN: 55 mL/min/{1.73_m2} — AB (ref >59)
Globulin, Total: 2.9 g/dL (ref 1.5–4.5)
Glucose: 84 mg/dL (ref 65–99)
Potassium: 4.5 mmol/L (ref 3.5–5.2)
Sodium: 143 mmol/L (ref 134–144)
TOTAL PROTEIN: 6.9 g/dL (ref 6.0–8.5)

## 2018-03-29 ENCOUNTER — Other Ambulatory Visit: Payer: Self-pay | Admitting: Internal Medicine

## 2018-03-30 ENCOUNTER — Telehealth: Payer: Self-pay | Admitting: *Deleted

## 2018-03-30 DIAGNOSIS — E785 Hyperlipidemia, unspecified: Secondary | ICD-10-CM

## 2018-03-30 MED ORDER — EZETIMIBE 10 MG PO TABS: 10.0000 mg | ORAL_TABLET | Freq: Every day | ORAL | 3 refills | Status: DC

## 2018-03-30 NOTE — Telephone Encounter (Addendum)
pt aware of results  New script sent to the pharmacy  Lab orders mailed to the pt    ----- Message from Lelon Perla, MD sent at 03/27/2018  7:21 AM EST ----- Add zetia 10 mg daily; lipids and liver 8 weeks [name]

## 2018-04-09 ENCOUNTER — Ambulatory Visit (INDEPENDENT_AMBULATORY_CARE_PROVIDER_SITE_OTHER): Payer: Medicare Other | Admitting: Orthopaedic Surgery

## 2018-04-11 ENCOUNTER — Ambulatory Visit (INDEPENDENT_AMBULATORY_CARE_PROVIDER_SITE_OTHER): Payer: Self-pay | Admitting: Orthopaedic Surgery

## 2018-06-06 ENCOUNTER — Ambulatory Visit (INDEPENDENT_AMBULATORY_CARE_PROVIDER_SITE_OTHER): Payer: Medicare HMO | Admitting: Internal Medicine

## 2018-06-06 ENCOUNTER — Encounter: Payer: Self-pay | Admitting: Internal Medicine

## 2018-06-06 ENCOUNTER — Other Ambulatory Visit (INDEPENDENT_AMBULATORY_CARE_PROVIDER_SITE_OTHER): Payer: Medicare HMO

## 2018-06-06 ENCOUNTER — Other Ambulatory Visit: Payer: Self-pay

## 2018-06-06 VITALS — BP 126/74 | HR 58 | Temp 98.1°F | Ht 70.0 in

## 2018-06-06 DIAGNOSIS — E11649 Type 2 diabetes mellitus with hypoglycemia without coma: Secondary | ICD-10-CM | POA: Diagnosis not present

## 2018-06-06 DIAGNOSIS — Z23 Encounter for immunization: Secondary | ICD-10-CM | POA: Diagnosis not present

## 2018-06-06 DIAGNOSIS — Z0000 Encounter for general adult medical examination without abnormal findings: Secondary | ICD-10-CM

## 2018-06-06 DIAGNOSIS — Z89612 Acquired absence of left leg above knee: Secondary | ICD-10-CM | POA: Diagnosis not present

## 2018-06-06 DIAGNOSIS — I509 Heart failure, unspecified: Secondary | ICD-10-CM | POA: Diagnosis not present

## 2018-06-06 LAB — CBC WITH DIFFERENTIAL/PLATELET
Basophils Absolute: 0 10*3/uL (ref 0.0–0.1)
Basophils Relative: 0.6 % (ref 0.0–3.0)
Eosinophils Absolute: 0.2 10*3/uL (ref 0.0–0.7)
Eosinophils Relative: 3 % (ref 0.0–5.0)
HCT: 39.6 % (ref 39.0–52.0)
Hemoglobin: 13.4 g/dL (ref 13.0–17.0)
Lymphocytes Relative: 24 % (ref 12.0–46.0)
Lymphs Abs: 1.7 10*3/uL (ref 0.7–4.0)
MCHC: 33.9 g/dL (ref 30.0–36.0)
MCV: 87.4 fl (ref 78.0–100.0)
Monocytes Absolute: 0.5 10*3/uL (ref 0.1–1.0)
Monocytes Relative: 7.8 % (ref 3.0–12.0)
Neutro Abs: 4.5 10*3/uL (ref 1.4–7.7)
Neutrophils Relative %: 64.6 % (ref 43.0–77.0)
Platelets: 147 10*3/uL — ABNORMAL LOW (ref 150.0–400.0)
RBC: 4.53 Mil/uL (ref 4.22–5.81)
RDW: 16 % — ABNORMAL HIGH (ref 11.5–15.5)
WBC: 7 10*3/uL (ref 4.0–10.5)

## 2018-06-06 LAB — URINALYSIS, ROUTINE W REFLEX MICROSCOPIC
Bilirubin Urine: NEGATIVE
Hgb urine dipstick: NEGATIVE
Ketones, ur: NEGATIVE
Nitrite: NEGATIVE
Specific Gravity, Urine: 1.02 (ref 1.000–1.030)
Total Protein, Urine: NEGATIVE
Urine Glucose: NEGATIVE
Urobilinogen, UA: 0.2 (ref 0.0–1.0)
pH: 7 (ref 5.0–8.0)

## 2018-06-06 LAB — LIPID PANEL
Cholesterol: 123 mg/dL (ref 0–200)
HDL: 37 mg/dL — ABNORMAL LOW (ref >39.00)
LDL Cholesterol: 57 mg/dL (ref 0–99)
NonHDL: 85.91
Total CHOL/HDL Ratio: 3
Triglycerides: 143 mg/dL (ref 0.0–149.0)
VLDL: 28.6 mg/dL (ref 0.0–40.0)

## 2018-06-06 LAB — HEPATIC FUNCTION PANEL
ALT: 20 U/L (ref 0–53)
AST: 21 U/L (ref 0–37)
Albumin: 3.9 g/dL (ref 3.5–5.2)
Alkaline Phosphatase: 89 U/L (ref 39–117)
Bilirubin, Direct: 0.1 mg/dL (ref 0.0–0.3)
Total Bilirubin: 0.7 mg/dL (ref 0.2–1.2)
Total Protein: 6.4 g/dL (ref 6.0–8.3)

## 2018-06-06 LAB — MICROALBUMIN / CREATININE URINE RATIO
Creatinine,U: 158.5 mg/dL
Microalb Creat Ratio: 1 mg/g (ref 0.0–30.0)
Microalb, Ur: 1.6 mg/dL (ref 0.0–1.9)

## 2018-06-06 LAB — HEMOGLOBIN A1C: Hgb A1c MFr Bld: 6.2 % (ref 4.6–6.5)

## 2018-06-06 LAB — TSH: TSH: 4.81 u[IU]/mL — ABNORMAL HIGH (ref 0.35–4.50)

## 2018-06-06 LAB — BASIC METABOLIC PANEL
BUN: 18 mg/dL (ref 6–23)
CO2: 27 mEq/L (ref 19–32)
Calcium: 9.7 mg/dL (ref 8.4–10.5)
Chloride: 106 mEq/L (ref 96–112)
Creatinine, Ser: 1.28 mg/dL (ref 0.40–1.50)
GFR: 54.82 mL/min — ABNORMAL LOW (ref >60.00)
Glucose, Bld: 92 mg/dL (ref 70–99)
Potassium: 4.2 mEq/L (ref 3.5–5.1)
Sodium: 139 mEq/L (ref 135–145)

## 2018-06-06 LAB — PSA: PSA: 0.33 ng/mL (ref 0.10–4.00)

## 2018-06-06 NOTE — Assessment & Plan Note (Signed)
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
## 2018-06-06 NOTE — Assessment & Plan Note (Signed)
stable overall by history and exam, recent data reviewed with pt, and pt to continue medical treatment as before,  to f/u any worsening symptoms or concerns, for a1c 

## 2018-06-06 NOTE — Patient Instructions (Signed)
You had the Tdap (tetanus) shot today  Please continue all other medications as before, and refills have been done if requested.  Please have the pharmacy call with any other refills you may need.  Please continue your efforts at being more active, low cholesterol diet, and weight control.  You are otherwise up to date with prevention measures today.  Please keep your appointments with your specialists as you may have planned  Please go to the LAB in the Basement (turn left off the elevator) for the tests to be done today  You will be contacted by phone if any changes need to be made immediately.  Otherwise, you will receive a letter about your results with an explanation, but please check with MyChart first.  Please remember to sign up for MyChart if you have not done so, as this will be important to you in the future with finding out test results, communicating by private email, and scheduling acute appointments online when needed.  Please return in 6 months, or sooner if needed, with Lab testing done 3-5 days before  

## 2018-06-15 DIAGNOSIS — R5381 Other malaise: Secondary | ICD-10-CM | POA: Diagnosis not present

## 2018-06-20 ENCOUNTER — Other Ambulatory Visit: Payer: Self-pay | Admitting: Internal Medicine

## 2018-06-21 ENCOUNTER — Encounter: Payer: Self-pay | Admitting: Internal Medicine

## 2018-06-21 MED ORDER — CARVEDILOL 12.5 MG PO TABS: ORAL_TABLET | ORAL | 0 refills | Status: DC

## 2018-07-02 ENCOUNTER — Other Ambulatory Visit: Payer: Self-pay | Admitting: Internal Medicine

## 2018-07-02 ENCOUNTER — Other Ambulatory Visit: Payer: Self-pay | Admitting: Cardiology

## 2018-07-02 DIAGNOSIS — K2270 Barrett's esophagus without dysplasia: Secondary | ICD-10-CM

## 2018-07-09 ENCOUNTER — Other Ambulatory Visit: Payer: Self-pay | Admitting: Internal Medicine

## 2018-07-10 MED ORDER — LORAZEPAM 1 MG PO TABS: ORAL_TABLET | ORAL | 2 refills | Status: DC

## 2018-07-10 NOTE — Telephone Encounter (Signed)
Done erx 

## 2018-07-24 ENCOUNTER — Other Ambulatory Visit: Payer: Self-pay | Admitting: *Deleted

## 2018-08-02 ENCOUNTER — Other Ambulatory Visit: Payer: Self-pay | Admitting: *Deleted

## 2018-08-16 ENCOUNTER — Other Ambulatory Visit: Payer: Self-pay | Admitting: Internal Medicine

## 2018-09-24 ENCOUNTER — Other Ambulatory Visit: Payer: Self-pay | Admitting: Internal Medicine
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
## 2018-11-08 ENCOUNTER — Ambulatory Visit (INDEPENDENT_AMBULATORY_CARE_PROVIDER_SITE_OTHER): Payer: Medicare HMO | Admitting: *Deleted

## 2018-11-08 DIAGNOSIS — Z789 Other specified health status: Secondary | ICD-10-CM

## 2018-11-08 DIAGNOSIS — Z0000 Encounter for general adult medical examination without abnormal findings: Secondary | ICD-10-CM | POA: Diagnosis not present

## 2018-11-08 NOTE — Patient Instructions (Addendum)
The [facility] provides information and referral services to aging adults 65+ in [place]. If there are waiting lists for community services, or if services are not available, [facility] connects clients with [facility] volunteers close to them who provide services such as respite care, wheelchair ramp construction, friendly visits, and transportation assistance. [facility]'s Call Center fields more than 350 calls each month.  AAIRS*- and SHIIP*-certified Call Center Specialists are ready to lend a compassionate ear and seek resources Monday through Friday, 9:00 am - 5:00 pm. The Call Center has met the needs of aging adults in all of [place] 100 counties. No religious affiliation is required; the only eligibility criterion is that clients be 65+ or older. Contact [facility] for help. *[facility] Information and Referral Systems *Seniors' Health Insurance Information Program Need help? Call [phone] today!  Continue to eat heart healthy diet (full of fruits, vegetables, whole grains, lean protein, water--limit salt, fat, and sugar intake) and increase physical activity as tolerated.  Continue doing brain stimulating activities (puzzles, reading, adult coloring books, staying active) to keep memory sharp.    Steven Charles , Thank you for taking time to come for your Medicare Wellness Visit. I appreciate your ongoing commitment to your health goals. Please review the following plan we discussed and let me know if I can assist you in the future.   These are the goals we discussed: Goals      Patient Stated   . patient (pt-stated)     Would like for left knee to feel better;        Other   . Continue to improve my mobility, work with physical therapy to become as independent as possible    . Patient Stated     Stay as independent as possible by continuing to keep my strength by moving.       This is a list of the screening recommended for you and due dates:  Health Maintenance  Topic  Date Due  . Flu Shot  10/27/2018  . Hemoglobin A1C  12/07/2018  . Eye exam for diabetics  02/27/2019  . Complete foot exam   06/06/2019  . Tetanus Vaccine  06/05/2028  .  Hepatitis C: One time screening is recommended by Center for Disease Control  (CDC) for  adults born from [birth year] through 1967.   Completed  . Pneumonia vaccines  Completed    Preventive Care 75 Years and Older, Male Preventive care refers to lifestyle choices and visits with your health care provider that can promote health and wellness. This includes:  A yearly physical exam. This is also called an annual well check.  Regular dental and eye exams.  Immunizations.  Screening for certain conditions.  Healthy lifestyle choices, such as diet and exercise. What can I expect for my preventive care visit? Physical exam Your health care provider will check:  Height and weight. These may be used to calculate body mass index (BMI), which is a measurement that tells if you are at a healthy weight.  Heart rate and blood pressure.  Your skin for abnormal spots. Counseling Your health care provider may ask you questions about:  Alcohol, tobacco, and drug use.  Emotional well-being.  Home and relationship well-being.  Sexual activity.  Eating habits.  History of falls.  Memory and ability to understand (cognition).  Work and work Statistician. What immunizations do I need?  Influenza (flu) vaccine  This is recommended every year. Tetanus, diphtheria, and pertussis (Tdap) vaccine  You  may need a Td booster every 10 years. Varicella (chickenpox) vaccine  You may need this vaccine if you have not already been vaccinated. Zoster (shingles) vaccine  You may need this after age 78. Pneumococcal conjugate (PCV13) vaccine  One dose is recommended after age 13. Pneumococcal polysaccharide (PPSV23) vaccine  One dose is recommended after age 61. Measles, mumps, and rubella (MMR) vaccine  You may need at  least one dose of MMR if you were born in 1957 or later. You may also need a second dose. Meningococcal conjugate (MenACWY) vaccine  You may need this if you have certain conditions. Hepatitis A vaccine  You may need this if you have certain conditions or if you travel or work in places where you may be exposed to hepatitis A. Hepatitis B vaccine  You may need this if you have certain conditions or if you travel or work in places where you may be exposed to hepatitis B. Haemophilus influenzae type b (Hib) vaccine  You may need this if you have certain conditions. You may receive vaccines as individual doses or as more than one vaccine together in one shot (combination vaccines). Talk with your health care provider about the risks and benefits of combination vaccines. What tests do I need? Blood tests  Lipid and cholesterol levels. These may be checked every 5 years, or more frequently depending on your overall health.  Hepatitis C test.  Hepatitis B test. Screening  Lung cancer screening. You may have this screening every year starting at age 62 if you have a 30-pack-year history of smoking and currently smoke or have quit within the past 15 years.  Colorectal cancer screening. All adults should have this screening starting at age 60 and continuing until age 43. Your health care provider may recommend screening at age 41 if you are at increased risk. You will have tests every 1-10 years, depending on your results and the type of screening test.  Prostate cancer screening. Recommendations will vary depending on your family history and other risks.  Diabetes screening. This is done by checking your blood sugar (glucose) after you have not eaten for a while (fasting). You may have this done every 1-3 years.  Abdominal aortic aneurysm (AAA) screening. You may need this if you are a current or former smoker.  Sexually transmitted disease (STD) testing. Follow these instructions at  home: Eating and drinking  Eat a diet that includes fresh fruits and vegetables, whole grains, lean protein, and low-fat dairy products. Limit your intake of foods with high amounts of sugar, saturated fats, and salt.  Take vitamin and mineral supplements as recommended by your health care provider.  Do not drink alcohol if your health care provider tells you not to drink.  If you drink alcohol: ? Limit how much you have to 0-2 drinks a day. ? Be aware of how much alcohol is in your drink. In the [place] one drink equals one 12 oz bottle of beer (355 mL), one 5 oz glass of  (148 mL), or one 1 oz glass of hard liquor (44 mL). Lifestyle  Take daily care of your teeth and gums.  Stay active. Exercise for at least 30 minutes on 5 or more days each week.  Do not use any products that contain nicotine or tobacco, such as cigarettes, e-cigarettes, and chewing tobacco. If you need help quitting, ask your health care provider.  If you are sexually active, practice safe sex. Use a condom or other form of  protection to prevent STIs (sexually transmitted infections).  Talk with your health care provider about taking a low-dose aspirin or statin. What's next?  Visit your health care provider once a year for a well check visit.  Ask your health care provider how often you should have your eyes and teeth checked.  Stay up to date on all vaccines. This information is not intended to replace advice given to you by your health care provider. Make sure you discuss any questions you have with your health care provider. Document Released: 04/10/2015 Document Revised: 03/08/2018 Document Reviewed: 03/08/2018 Elsevier Patient Education  2020 [employer].

## 2018-11-09 ENCOUNTER — Telehealth: Payer: Self-pay

## 2018-11-09 NOTE — Telephone Encounter (Signed)
Copied from [facility] [phone]. Topic: Referral - Status >> Nov 09, 2018  5:50 PM Simone Curia D wrote: [id number]/58/6825 Spoke with patient about [employer], utility assistance and [place] [facility]. Ambrose Mantle [phone]

## 2018-11-14 ENCOUNTER — Telehealth: Payer: Self-pay

## 2018-11-14 NOTE — Telephone Encounter (Signed)
Copied from [place] [phone]. Topic: Referral - Status >> Nov 14, 2018  4:82 PM Simone Curia D wrote: 10/01/8673 Spoke with patient about financial assistance with utilities, food banks and amputee support group. Patient plans to call resources this week. Ambrose Mantle [phone]

## 2018-12-03 ENCOUNTER — Encounter: Payer: Self-pay | Admitting: Gastroenterology

## 2018-12-07 ENCOUNTER — Ambulatory Visit: Payer: Medicare HMO | Admitting: Internal Medicine

## 2018-12-11 ENCOUNTER — Telehealth: Payer: Self-pay | Admitting: Internal Medicine

## 2018-12-11 MED ORDER — LORAZEPAM 1 MG PO TABS: ORAL_TABLET | ORAL | 2 refills | Status: DC

## 2018-12-11 NOTE — Telephone Encounter (Signed)
Pt stated he has requested meds from pharmacy and Pharmacy stated they have not had a response for approval/ Pt is out of medication and has been out for the last 3 days.  Medication Refill - Medication:  LORazepam (ATIVAN) 1 MG tablet   Has the patient contacted their pharmacy? Yes.   (Agent: If no, request that the patient contact the pharmacy for the refill.) (Agent: If yes, when and what did the pharmacy advise?)  Preferred Pharmacy (with phone number or street name):  [facility] [place], [place] - [address] [place] [address] [place] [address] [phone] (Phone) [phone] (Fax)     Agent: Please be advised that RX refills may take up to 3 business days. We ask that you follow-up with your pharmacy.

## 2018-12-11 NOTE — Telephone Encounter (Signed)
Done erx 

## 2018-12-17 ENCOUNTER — Other Ambulatory Visit: Payer: Self-pay | Admitting: Internal Medicine

## 2018-12-17 DIAGNOSIS — K2270 Barrett's esophagus without dysplasia: Secondary | ICD-10-CM

## 2018-12-31 ENCOUNTER — Ambulatory Visit: Payer: Self-pay

## 2018-12-31 NOTE — Telephone Encounter (Signed)
Called patient x2, VM is full.   Yes he would be due for a flu shot if he has not had one yet this year. Please set up a flu shot appointment when patient returns call.

## 2018-12-31 NOTE — Telephone Encounter (Signed)
Incoming call from Patient.  Complaint of redish Welps showhing up.  Every time he goes out side.  States that it Itches . Onset 2 weeks ago.  Welps big as an Chiropractor.  Moderate pain. No other Sx.  Reviewed Protocol . Recommended  hydrocortisone . Patient voiced understanding.  Will try States Pt.  Patient wants to know if he is due for a flu shot.        [facility] Male, 73 y.o., 04-15-1945 MRN:  [id number] Phone:  [phone] Steven Charles) PCP:  Biagio Borg, MD Coverage:  [facility] Medicare/[employer] Medicare [place] With Internal Medicine 11/12/2019 at 11:45 AM Message from Sheran Luz sent at 12/31/2018 2:10 PM EDT  Summary: itchy raised spots on arms after being outside    Patient requesting call back from nurse to discuss what he thinks is possible allergies. Patient states every time he goes outside he gets itchy raised areas on both arms. Patient would like advice.         Call History   Type Contact Phone User  12/31/2018 02:09 PM EDT Phone (Incoming) Steven, Charles (Self) [phone] Steven Charles) Sheran Luz    Reason for Disposition . [1] Applying cream or ointment AND [2] causes severe itch, burning or pain  Answer Assessment - Initial Assessment Questions 1. APPEARANCE of RASH: "Describe the rash."   Turns red welp appear.   2. LOCATION: "Where is the rash located?"     Both arms 3. NUMBER: "How many spots are there?"      To many 4. SIZE: "How big are the spots?" (Inches, centimeters or compare to size of a coin)     pencil 5. ONSET: "When did the rash start?"      For 2 weeks 6. ITCHING: "Does the rash itch?" If so, ask: "How bad is the itch?"  (Scale 1-10; or mild, moderate, severe)     7. PAIN: "Does the rash hurt?" If so, ask: "How bad is the pain?"  (Scale 1-10; or mild, moderate, severe)    Moderate,  8. OTHER SYMPTOMS: "Do you have any other symptoms?" (e.g., fever)     denies 9. PREGNANCY: "Is there any chance you are pregnant?"  "When was your last menstrual period?"     na  Protocols used: RASH OR REDNESS - LOCALIZED-A-AH

## 2019-03-12 ENCOUNTER — Other Ambulatory Visit: Payer: Self-pay | Admitting: Internal Medicine

## 2019-03-12 ENCOUNTER — Other Ambulatory Visit: Payer: Self-pay | Admitting: Cardiology

## 2019-03-12 NOTE — Telephone Encounter (Signed)
Rx has been sent to the pharmacy electronically. ° °

## 2019-04-01 ENCOUNTER — Other Ambulatory Visit: Payer: Self-pay | Admitting: Internal Medicine

## 2019-04-01 MED ORDER — POTASSIUM CHLORIDE CRYS ER 20 MEQ PO TBCR: 20.0000 meq | EXTENDED_RELEASE_TABLET | Freq: Every day | ORAL | 0 refills | Status: DC

## 2019-04-01 NOTE — Telephone Encounter (Signed)
Medication Refill - Medication: potassium chloride SA (K-DUR) 20 MEQ tablet    Preferred Pharmacy (with phone number or street name):  [place], [place] Phone:  [phone]  Fax:  [phone]       Agent: Please be advised that RX refills may take up to 3 business days. We ask that you follow-up with your pharmacy.
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
## 2019-04-19 ENCOUNTER — Telehealth (INDEPENDENT_AMBULATORY_CARE_PROVIDER_SITE_OTHER): Payer: Medicare HMO | Admitting: Cardiology

## 2019-04-19 ENCOUNTER — Encounter: Payer: Self-pay | Admitting: Cardiology

## 2019-04-19 VITALS — Ht 70.0 in | Wt 266.0 lb

## 2019-04-19 DIAGNOSIS — I25810 Atherosclerosis of coronary artery bypass graft(s) without angina pectoris: Secondary | ICD-10-CM | POA: Diagnosis not present

## 2019-04-19 DIAGNOSIS — I10 Essential (primary) hypertension: Secondary | ICD-10-CM

## 2019-04-19 DIAGNOSIS — E785 Hyperlipidemia, unspecified: Secondary | ICD-10-CM

## 2019-04-19 MED ORDER — ASPIRIN EC 81 MG PO TBEC: 81.0000 mg | DELAYED_RELEASE_TABLET | Freq: Every day | ORAL | Status: AC

## 2019-04-19 NOTE — Patient Instructions (Signed)
Medication Instructions:  DECREASE ASPIRIN TO 81 MG ONCE DAILY  *If you need a refill on your cardiac medications before your next appointment, please call your pharmacy*  Lab Work: Your physician recommends that you return for lab work [place]  If you have labs (blood work) drawn today and your tests are completely normal, you will receive your results only by: Marland Kitchen MyChart Message (if you have MyChart) OR . A paper copy in the mail If you have any lab test that is abnormal or we need to change your treatment, we will call you to review the results.  Follow-Up: At [facility], you and your health needs are our priority.  As part of our continuing mission to provide you with exceptional heart care, we have created designated Provider Care Teams.  These Care Teams include your primary Cardiologist (physician) and Advanced Practice Providers (APPs -  Physician Assistants and Nurse Practitioners) who all work together to provide you with the care you need, when you need it.  Your next appointment:   12 month(s)  The format for your next appointment:   Either In Person or Virtual  Provider:   You may see [name] MD or one of the following Advanced Practice Providers on your designated Care Team:    Kerin Ransom, PA-C  [place], [place]  Coletta Memos, 

## 2019-04-25 ENCOUNTER — Other Ambulatory Visit: Payer: Self-pay | Admitting: Internal Medicine

## 2019-04-25 NOTE — Telephone Encounter (Signed)
Done erx 

## 2019-05-20 ENCOUNTER — Encounter: Payer: Self-pay | Admitting: *Deleted

## 2019-06-03 ENCOUNTER — Ambulatory Visit: Payer: Medicare HMO | Attending: Internal Medicine

## 2019-06-08 ENCOUNTER — Ambulatory Visit: Payer: Medicare HMO | Attending: Internal Medicine

## 2019-06-08 DIAGNOSIS — Z23 Encounter for immunization: Secondary | ICD-10-CM

## 2019-06-08 NOTE — Progress Notes (Signed)
   Covid-19 Vaccination Clinic  Name:  Steven Charles    MRN: [id number] DOB: July 01, 1945  06/08/2019  [name] was observed post Covid-19 immunization for 30 minutes based on pre-vaccination screening without incident. He was provided with Vaccine Information Sheet and instruction to access the V-Safe system.   [name] was instructed to call 911 with any severe reactions post vaccine: Marland Kitchen Difficulty breathing  . Swelling of face and throat  . A fast heartbeat  . A bad rash all over body  . Dizziness and weakness   Immunizations Administered    Name Date Dose VIS Date Route   Pfizer COVID-19 Vaccine 06/08/2019 12:02 PM 0.3 mL 03/08/2019 Intramuscular   Manufacturer: [place]   Lot: [id number]   [place]: [id number]

## 2019-06-10 ENCOUNTER — Telehealth: Payer: Self-pay | Admitting: Orthopedic Surgery

## 2019-06-10 NOTE — Telephone Encounter (Signed)
Steven Charles would like an Rx for a new wheelchair.  He says his current one is "worn out" and needs to be replaced.

## 2019-06-10 NOTE — Telephone Encounter (Signed)
Please advise 

## 2019-06-10 NOTE — Telephone Encounter (Signed)
Faxed to [employer] [facility]), patient aware I did this

## 2019-06-10 NOTE — Telephone Encounter (Signed)
I think that sounds fine.  It needs to be a wheelchair that will accommodate his body size.  He can also have removable arms and elevated leg rest if needed.

## 2019-06-11 ENCOUNTER — Telehealth: Payer: Self-pay | Admitting: Orthopaedic Surgery

## 2019-06-11 NOTE — Telephone Encounter (Signed)
Received call from [place] with [facility] needing a diag. And office visit notes showing the need for a large standard wheelchair. The fax# is [phone]  The phone # is [phone]

## 2019-06-11 NOTE — Telephone Encounter (Signed)
FAXED

## 2019-06-17 ENCOUNTER — Other Ambulatory Visit: Payer: Self-pay | Admitting: Cardiology

## 2019-06-26 ENCOUNTER — Telehealth: Payer: Self-pay | Admitting: Orthopaedic Surgery

## 2019-06-26 ENCOUNTER — Encounter: Payer: Self-pay | Admitting: Radiology

## 2019-06-26 NOTE — Telephone Encounter (Signed)
Duplicate message. Already contacted patient and [place]. Letter has been faxed to [employer].

## 2019-06-26 NOTE — Telephone Encounter (Signed)
Pt called in stating he spoke with [employer] on 06/26/19 and they told him the note from our office was outdated and they would need a more current note faxed over.   Fax# [phone]

## 2019-06-26 NOTE — Telephone Encounter (Signed)
Patient called.   Refer to previous telephone encounters.   Patient still has not heard back from [employer]. I read him the notes made by Danae Chen and Caryl Pina, informing him that we did our part in sending the Rx.   He is requesting we get in touch with the company again.   Call back: [phone]

## 2019-06-26 NOTE — Telephone Encounter (Signed)
Spoke with Malachy Mood from [employer]. I have sent letter explaining why patient needs wheelchair. Patient has not been seen since 2018 and [place] needed some type of updated note for patient for approval. I faxed letter [phone] ATTN: Malachy Mood

## 2019-07-01 ENCOUNTER — Other Ambulatory Visit: Payer: Self-pay | Admitting: Internal Medicine

## 2019-07-01 ENCOUNTER — Other Ambulatory Visit: Payer: Self-pay | Admitting: Cardiology

## 2019-07-01 MED ORDER — GABAPENTIN 300 MG PO CAPS: 300.0000 mg | ORAL_CAPSULE | Freq: Three times a day (TID) | ORAL | 0 refills | Status: DC

## 2019-07-01 MED ORDER — CARVEDILOL 12.5 MG PO TABS: 12.5000 mg | ORAL_TABLET | Freq: Two times a day (BID) | ORAL | 0 refills | Status: DC

## 2019-07-01 NOTE — Telephone Encounter (Signed)
Please refill as per office routine med refill policy (all routine meds refilled for 3 mo or monthly per pt preference up to one year from last visit, then month to month grace period for 3 mo, then further med refills will have to be denied)  

## 2019-07-01 NOTE — Telephone Encounter (Signed)
New message:   1.Medication Requested: gabapentin (NEURONTIN) 300 MG capsule carvedilol (COREG) 12.5 MG tablet 2. Pharmacy (Name, Street, [place]): [place], [place] - [address] 3. On Med List: Yes    4. Last Visit with PCP:   5. Next visit date with PCP:  Needs a 14 day supply sent until he can get his 7 day supply from [facility]: Please be advised that RX refills may take up to 3 business days. We ask that you follow-up with your pharmacy.

## 2019-07-01 NOTE — Telephone Encounter (Signed)
This patient has to be seen for any medication refills.  Hasn't been seen since 06-06-2018

## 2019-07-01 NOTE — Telephone Encounter (Signed)
Ok for one month only but will need to send to local pharmacy I guess as he would not one month to go to [facility]   - sent to [facility]

## 2019-07-02 ENCOUNTER — Ambulatory Visit: Payer: Medicare HMO | Attending: Internal Medicine

## 2019-07-02 DIAGNOSIS — Z23 Encounter for immunization: Secondary | ICD-10-CM

## 2019-07-02 NOTE — Progress Notes (Signed)
   Covid-19 Vaccination Clinic  Name:  Steven Charles    MRN: [id number] DOB: 07/24/1945  07/02/2019  [name] was observed post Covid-19 immunization for 30 minutes based on pre-vaccination screening without incident. He was provided with Vaccine Information Sheet and instruction to access the V-Safe system.   [name] was instructed to call 911 with any severe reactions post vaccine: Marland Kitchen Difficulty breathing  . Swelling of face and throat  . A fast heartbeat  . A bad rash all over body  . Dizziness and weakness   Immunizations Administered    Name Date Dose VIS Date Route   Pfizer COVID-19 Vaccine 07/02/2019 11:26 AM 0.3 mL 03/08/2019 Intramuscular   Manufacturer: [employer]   Lot: [id number]   [place]: [id number]

## 2019-07-02 NOTE — Addendum Note (Signed)
Addended by: Earnstine Regal on: 07/02/2019 11:05 AM   Modules accepted: Orders

## 2019-07-15 ENCOUNTER — Telehealth: Payer: Self-pay

## 2019-07-15 ENCOUNTER — Other Ambulatory Visit: Payer: Self-pay | Admitting: Cardiology

## 2019-07-15 NOTE — Telephone Encounter (Signed)
1.Medication Requested:pantoprazole (PROTONIX) 40 MG tablet  potassium chloride SA (KLOR-CON) 20 MEQ tablet FLUoxetine (PROZAC) 40 MG capsule gabapentin (NEURONTIN) 300 MG capsule gabapentin (NEURONTIN) 300 MG capsule carvedilol (COREG) 12.5 MG tablet carvedilol (COREG) 12.5 MG tablet potassium chloride SA (KLOR-CON) 20 MEQ tablet HYDROcodone-acetaminophen (NORCO) 5-325 MG tablet ibuprofen (ADVIL,MOTRIN) 400 MG tablet Lancets (ONETOUCH ULTRASOFT) lancets levothyroxine (SYNTHROID) 50 MCG tablet lidocaine (LIDODERM) 5 % lisinopril (ZESTRIL) 20 MG tablet LORazepam (ATIVAN) 1 MG tablet Multiple Vitamin (MULTIVITAMIN) tablet nystatin (MYCOSTATIN/NYSTOP) powder Omega-3 Fatty Acids (FISH OIL) 1000 MG CAPS pantoprazole (PROTONIX) 40 MG tablet Polyethyl Glycol-Propyl Glycol (SYSTANE ULTRA) 0.4-0.3 % SOLN potassium chloride SA (KLOR-CON) 20 MEQ tablet tamsulosin (FLOMAX) 0.4 MG CAPS capsule  2. Pharmacy (Name, Street, City):[facility] [phone] - [place], [place] - [address]  3. On Med List: Yes   4. Last Visit with PCP: 3.10.20  5. Next visit date with PCP: no appt is made at this time     Agent: Please be advised that RX refills may take up to 3 business days. We ask that you follow-up with your pharmacy.

## 2019-07-15 NOTE — Telephone Encounter (Signed)
*  STAT* If patient is at the pharmacy, call can be transferred to refill team.   1. Which medications need to be refilled? (please list name of each medication and dose if known)  atorvastatin (LIPITOR) 80 MG tablet  2. Which pharmacy/location (including street and city if local pharmacy) is medication to be sent to? [id number] - [place] - [address]  3. Do they need a 30 day or 90 day supply? Steven Charles

## 2019-07-16 NOTE — Telephone Encounter (Signed)
Please refill as per office routine med refill policy (all routine meds refilled for 3 mo or monthly per pt preference up to one year from last visit, then month to month grace period for 3 mo, then further med refills will have to be denied)  

## 2019-07-16 NOTE — Telephone Encounter (Signed)
Please advise, this patient hasn't been seen since 05-2018.   Can he receive any refills

## 2019-07-17 ENCOUNTER — Other Ambulatory Visit: Payer: Self-pay

## 2019-07-17 DIAGNOSIS — K2270 Barrett's esophagus without dysplasia: Secondary | ICD-10-CM

## 2019-07-17 DIAGNOSIS — E1100 Type 2 diabetes mellitus with hyperosmolarity without nonketotic hyperglycemic-hyperosmolar coma (NKHHC): Secondary | ICD-10-CM

## 2019-07-17 MED ORDER — CARVEDILOL 12.5 MG PO TABS: ORAL_TABLET | ORAL | 0 refills | Status: DC

## 2019-07-17 MED ORDER — PANTOPRAZOLE SODIUM 40 MG PO TBEC: 40.0000 mg | DELAYED_RELEASE_TABLET | Freq: Two times a day (BID) | ORAL | 0 refills | Status: DC

## 2019-07-17 MED ORDER — FLUOXETINE HCL 40 MG PO CAPS: 40.0000 mg | ORAL_CAPSULE | Freq: Every day | ORAL | 0 refills | Status: DC

## 2019-07-17 MED ORDER — GABAPENTIN 300 MG PO CAPS: 300.0000 mg | ORAL_CAPSULE | Freq: Three times a day (TID) | ORAL | 0 refills | Status: DC

## 2019-07-17 MED ORDER — LEVOTHYROXINE SODIUM 50 MCG PO TABS: 50.0000 ug | ORAL_TABLET | Freq: Every day | ORAL | 0 refills | Status: DC

## 2019-07-17 MED ORDER — TAMSULOSIN HCL 0.4 MG PO CAPS: 0.4000 mg | ORAL_CAPSULE | Freq: Two times a day (BID) | ORAL | 0 refills | Status: DC

## 2019-07-17 MED ORDER — LISINOPRIL 20 MG PO TABS: 20.0000 mg | ORAL_TABLET | Freq: Every day | ORAL | 0 refills | Status: DC

## 2019-07-17 NOTE — Telephone Encounter (Signed)
Sent refills to pharmacy as requested

## 2019-07-18 MED ORDER — ATORVASTATIN CALCIUM 80 MG PO TABS: 80.0000 mg | ORAL_TABLET | Freq: Every day | ORAL | 3 refills | Status: DC

## 2019-07-18 NOTE — Telephone Encounter (Signed)
Rx(s) sent to pharmacy electronically.  

## 2019-07-19 DIAGNOSIS — E785 Hyperlipidemia, unspecified: Secondary | ICD-10-CM | POA: Diagnosis not present

## 2019-07-20 LAB — COMPREHENSIVE METABOLIC PANEL
ALT: 26 IU/L (ref 0–44)
AST: 27 IU/L (ref 0–40)
Albumin/Globulin Ratio: 1.7 (ref 1.2–2.2)
Albumin: 4 g/dL (ref 3.7–4.7)
Alkaline Phosphatase: 92 IU/L (ref 39–117)
BUN/Creatinine Ratio: 12 (ref 10–24)
BUN: 16 mg/dL (ref 8–27)
Bilirubin Total: 0.5 mg/dL (ref 0.0–1.2)
CO2: 25 mmol/L (ref 20–29)
Calcium: 9.6 mg/dL (ref 8.6–10.2)
Chloride: 103 mmol/L (ref 96–106)
Creatinine, Ser: 1.38 mg/dL — ABNORMAL HIGH (ref 0.76–1.27)
GFR calc Af Amer: 57 mL/min/{1.73_m2} — ABNORMAL LOW (ref >59)
GFR calc non Af Amer: 50 mL/min/{1.73_m2} — ABNORMAL LOW (ref >59)
Globulin, Total: 2.3 g/dL (ref 1.5–4.5)
Glucose: 85 mg/dL (ref 65–99)
Potassium: 4.7 mmol/L (ref 3.5–5.2)
Sodium: 140 mmol/L (ref 134–144)
Total Protein: 6.3 g/dL (ref 6.0–8.5)

## 2019-07-20 LAB — LIPID PANEL
Chol/HDL Ratio: 3.8 ratio (ref 0.0–5.0)
Cholesterol, Total: 138 mg/dL (ref 100–199)
HDL: 36 mg/dL — ABNORMAL LOW (ref >39)
LDL Chol Calc (NIH): 70 mg/dL (ref 0–99)
Triglycerides: 191 mg/dL — ABNORMAL HIGH (ref 0–149)
VLDL Cholesterol Cal: 32 mg/dL (ref 5–40)

## 2019-07-22 ENCOUNTER — Encounter: Payer: Self-pay | Admitting: *Deleted

## 2019-07-24 ENCOUNTER — Other Ambulatory Visit: Payer: Self-pay | Admitting: Internal Medicine

## 2019-07-29 ENCOUNTER — Ambulatory Visit (INDEPENDENT_AMBULATORY_CARE_PROVIDER_SITE_OTHER): Payer: Medicare HMO | Admitting: Internal Medicine

## 2019-07-29 ENCOUNTER — Other Ambulatory Visit: Payer: Self-pay

## 2019-07-29 VITALS — BP 138/72 | HR 70 | Temp 98.5°F | Ht 70.0 in

## 2019-07-29 DIAGNOSIS — J309 Allergic rhinitis, unspecified: Secondary | ICD-10-CM

## 2019-07-29 DIAGNOSIS — K2270 Barrett's esophagus without dysplasia: Secondary | ICD-10-CM | POA: Diagnosis not present

## 2019-07-29 DIAGNOSIS — E11649 Type 2 diabetes mellitus with hypoglycemia without coma: Secondary | ICD-10-CM

## 2019-07-29 DIAGNOSIS — E538 Deficiency of other specified B group vitamins: Secondary | ICD-10-CM

## 2019-07-29 DIAGNOSIS — J019 Acute sinusitis, unspecified: Secondary | ICD-10-CM | POA: Diagnosis not present

## 2019-07-29 DIAGNOSIS — F329 Major depressive disorder, single episode, unspecified: Secondary | ICD-10-CM

## 2019-07-29 DIAGNOSIS — F32A Depression, unspecified: Secondary | ICD-10-CM

## 2019-07-29 DIAGNOSIS — E559 Vitamin D deficiency, unspecified: Secondary | ICD-10-CM | POA: Diagnosis not present

## 2019-07-29 DIAGNOSIS — Z0001 Encounter for general adult medical examination with abnormal findings: Secondary | ICD-10-CM | POA: Diagnosis not present

## 2019-07-29 DIAGNOSIS — Z89612 Acquired absence of left leg above knee: Secondary | ICD-10-CM | POA: Diagnosis not present

## 2019-07-29 LAB — CBC WITH DIFFERENTIAL/PLATELET
Basophils Absolute: 0.1 10*3/uL (ref 0.0–0.1)
Basophils Relative: 0.7 % (ref 0.0–3.0)
Eosinophils Absolute: 0.6 10*3/uL (ref 0.0–0.7)
Eosinophils Relative: 6.7 % — ABNORMAL HIGH (ref 0.0–5.0)
HCT: 40.4 % (ref 39.0–52.0)
Hemoglobin: 14 g/dL (ref 13.0–17.0)
Lymphocytes Relative: 22.8 % (ref 12.0–46.0)
Lymphs Abs: 2.1 10*3/uL (ref 0.7–4.0)
MCHC: 34.7 g/dL (ref 30.0–36.0)
MCV: 96.2 fl (ref 78.0–100.0)
Monocytes Absolute: 0.7 10*3/uL (ref 0.1–1.0)
Monocytes Relative: 7.6 % (ref 3.0–12.0)
Neutro Abs: 5.8 10*3/uL (ref 1.4–7.7)
Neutrophils Relative %: 62.2 % (ref 43.0–77.0)
Platelets: 126 10*3/uL — ABNORMAL LOW (ref 150.0–400.0)
RBC: 4.2 Mil/uL — ABNORMAL LOW (ref 4.22–5.81)
RDW: 13.8 % (ref 11.5–15.5)
WBC: 9.3 10*3/uL (ref 4.0–10.5)

## 2019-07-29 LAB — MICROALBUMIN / CREATININE URINE RATIO
Creatinine,U: 86.1 mg/dL
Microalb Creat Ratio: 0.8 mg/g (ref 0.0–30.0)
Microalb, Ur: <0.7 mg/dL (ref 0.0–1.9)

## 2019-07-29 LAB — HEMOGLOBIN A1C: Hgb A1c MFr Bld: 6 % (ref 4.6–6.5)

## 2019-07-29 MED ORDER — POTASSIUM CHLORIDE CRYS ER 20 MEQ PO TBCR: 20.0000 meq | EXTENDED_RELEASE_TABLET | Freq: Every day | ORAL | 3 refills | Status: DC

## 2019-07-29 MED ORDER — TAMSULOSIN HCL 0.4 MG PO CAPS: 0.4000 mg | ORAL_CAPSULE | Freq: Two times a day (BID) | ORAL | 3 refills | Status: DC

## 2019-07-29 MED ORDER — GABAPENTIN 300 MG PO CAPS: ORAL_CAPSULE | ORAL | 1 refills | Status: DC

## 2019-07-29 MED ORDER — BUPROPION HCL ER (XL) 300 MG PO TB24
300.0000 mg | ORAL_TABLET | Freq: Every day | ORAL | 3 refills | Status: DC
Start: 2019-07-29 — End: 2020-06-18

## 2019-07-29 MED ORDER — EZETIMIBE 10 MG PO TABS: 10.0000 mg | ORAL_TABLET | Freq: Every day | ORAL | 3 refills | Status: DC

## 2019-07-29 MED ORDER — AZITHROMYCIN 250 MG PO TABS: ORAL_TABLET | ORAL | 0 refills | Status: DC

## 2019-07-29 MED ORDER — LEVOTHYROXINE SODIUM 50 MCG PO TABS: 50.0000 ug | ORAL_TABLET | Freq: Every day | ORAL | 3 refills | Status: DC

## 2019-07-29 MED ORDER — LISINOPRIL 20 MG PO TABS: 20.0000 mg | ORAL_TABLET | Freq: Every day | ORAL | 3 refills | Status: DC

## 2019-07-29 MED ORDER — ATORVASTATIN CALCIUM 80 MG PO TABS: 80.0000 mg | ORAL_TABLET | Freq: Every day | ORAL | 3 refills | Status: DC

## 2019-07-29 MED ORDER — PREDNISONE 10 MG PO TABS
ORAL_TABLET | ORAL | 0 refills | Status: DC
Start: 2019-07-29 — End: 2020-02-13

## 2019-07-29 MED ORDER — FLUOXETINE HCL 40 MG PO CAPS: 40.0000 mg | ORAL_CAPSULE | Freq: Every day | ORAL | 3 refills | Status: DC

## 2019-07-29 MED ORDER — CARVEDILOL 12.5 MG PO TABS: ORAL_TABLET | ORAL | 3 refills | Status: DC

## 2019-07-29 MED ORDER — METHYLPREDNISOLONE ACETATE 80 MG/ML IJ SUSP
80.0000 mg | Freq: Once | INTRAMUSCULAR | Status: AC
Start: 2019-07-29 — End: 2019-07-29
  Administered 2019-07-29: 80 mg via INTRAMUSCULAR

## 2019-07-29 MED ORDER — FUROSEMIDE 20 MG PO TABS: 20.0000 mg | ORAL_TABLET | Freq: Two times a day (BID) | ORAL | 3 refills | Status: DC | PRN

## 2019-07-29 MED ORDER — LORAZEPAM 1 MG PO TABS: ORAL_TABLET | ORAL | 2 refills | Status: DC

## 2019-07-29 MED ORDER — PANTOPRAZOLE SODIUM 40 MG PO TBEC: 40.0000 mg | DELAYED_RELEASE_TABLET | Freq: Two times a day (BID) | ORAL | 3 refills | Status: DC
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
## 2019-07-29 NOTE — Patient Instructions (Addendum)
Please remember to call for your yearly eye exam  Please see your prosthetic leg provider for further fitting  Please take all new medication as prescribed - the wellbutrin xl 300 , the antibiotic, and the zpack  You had the steroid shot today  Please continue all other medications as before, and refills have been done if requested.  Please have the pharmacy call with any other refills you may need.  Please continue your efforts at being more active, low cholesterol diet, and weight control.  You are otherwise up to date with prevention measures today.  Please keep your appointments with your specialists as you may have planned  Please go to the LAB at the blood drawing area for the tests to be done  You will be contacted by phone if any changes need to be made immediately.  Otherwise, you will receive a letter about your results with an explanation, but please check with MyChart first.  Please remember to sign up for MyChart if you have not done so, as this will be important to you in the future with finding out test results, communicating by private email, and scheduling acute appointments online when needed.  Please make an Appointment to return in 6 months, or sooner if needed

## 2019-07-30 ENCOUNTER — Other Ambulatory Visit: Payer: Self-pay | Admitting: Internal Medicine

## 2019-07-30 DIAGNOSIS — R3129 Other microscopic hematuria: Secondary | ICD-10-CM

## 2019-07-30 LAB — URINALYSIS, ROUTINE W REFLEX MICROSCOPIC
Bilirubin Urine: NEGATIVE
Ketones, ur: NEGATIVE
Nitrite: NEGATIVE
Specific Gravity, Urine: 1.02 (ref 1.000–1.030)
Total Protein, Urine: NEGATIVE
Urine Glucose: NEGATIVE
Urobilinogen, UA: 0.2 (ref 0.0–1.0)
pH: 5 (ref 5.0–8.0)

## 2019-07-30 LAB — VITAMIN D 25 HYDROXY (VIT D DEFICIENCY, FRACTURES): VITD: 58.86 ng/mL (ref 30.00–100.00)

## 2019-07-30 LAB — BASIC METABOLIC PANEL
BUN: 21 mg/dL (ref 6–23)
CO2: 26 mEq/L (ref 19–32)
Calcium: 9.5 mg/dL (ref 8.4–10.5)
Chloride: 106 mEq/L (ref 96–112)
Creatinine, Ser: 1.39 mg/dL (ref 0.40–1.50)
GFR: 49.69 mL/min — ABNORMAL LOW (ref >60.00)
Glucose, Bld: 100 mg/dL — ABNORMAL HIGH (ref 70–99)
Potassium: 4.2 mEq/L (ref 3.5–5.1)
Sodium: 137 mEq/L (ref 135–145)

## 2019-07-30 LAB — HEPATIC FUNCTION PANEL
ALT: 19 U/L (ref 0–53)
AST: 18 U/L (ref 0–37)
Albumin: 3.7 g/dL (ref 3.5–5.2)
Alkaline Phosphatase: 85 U/L (ref 39–117)
Bilirubin, Direct: 0.1 mg/dL (ref 0.0–0.3)
Total Bilirubin: 0.5 mg/dL (ref 0.2–1.2)
Total Protein: 6.3 g/dL (ref 6.0–8.3)

## 2019-07-30 LAB — PSA: PSA: 0.23 ng/mL (ref 0.10–4.00)

## 2019-07-30 LAB — VITAMIN B12: Vitamin B-12: 1369 pg/mL — ABNORMAL HIGH (ref 211–911)

## 2019-07-30 LAB — TSH: TSH: 7.32 u[IU]/mL — ABNORMAL HIGH (ref 0.35–4.50)

## 2019-07-30 MED ORDER — LEVOTHYROXINE SODIUM 75 MCG PO TABS: 75.0000 ug | ORAL_TABLET | Freq: Every day | ORAL | 3 refills | Status: DC

## 2019-07-31 ENCOUNTER — Encounter: Payer: Self-pay | Admitting: Internal Medicine

## 2019-07-31 DIAGNOSIS — J019 Acute sinusitis, unspecified: Secondary | ICD-10-CM | POA: Insufficient documentation

## 2019-07-31 NOTE — Assessment & Plan Note (Signed)
[place] for add wellbutrin xl 300 qd, refer counseling

## 2019-07-31 NOTE — Assessment & Plan Note (Signed)
stable overall by history and exam, recent data reviewed with pt, and pt to continue medical treatment as before,  to f/u any worsening symptoms or concerns  

## 2019-07-31 NOTE — Assessment & Plan Note (Addendum)
Mild to mod, for antibx course,  to f/u any worsening symptoms or concerns  I spent 40 minutes in addition to time for CPX wellness examination in preparing to see the patient by review of recent labs, imaging and procedures, obtaining and reviewing separately obtained history, communicating with the patient and family or caregiver, ordering medications, tests or procedures, and documenting clinical information in the EHR including the differential Dx, treatment, and any further evaluation and other management of acute sinus infection, allergic rhinitis, depression, DM

## 2019-07-31 NOTE — Assessment & Plan Note (Signed)

## 2019-07-31 NOTE — Assessment & Plan Note (Signed)
Mild to mod, depomedrol iM 80,  to f/u any worsening symptoms or concerns

## 2019-08-15 ENCOUNTER — Telehealth: Payer: Self-pay | Admitting: Orthopaedic Surgery

## 2019-08-15 NOTE — Telephone Encounter (Signed)
Patient wife Steven Charles called requesting Dr. Ninfa Linden send a pre authorization for patient to be able to get an electric wheelchair. Steven Charles states they have contacted [employer] and agent told them to that Dr. Ninfa Linden would need to put in orders for chair. Patient's wife states she cant not push husband in chair. He is well over 250 pounds ad she weight a little over 100 and she is sick. Please contact patient and wife about this matter. Patient phone number is [phone].

## 2019-08-16 NOTE — Telephone Encounter (Signed)
Called patient, no answer, no voicemail

## 2019-08-19 ENCOUNTER — Telehealth: Payer: Self-pay | Admitting: Orthopaedic Surgery

## 2019-08-19 NOTE — Telephone Encounter (Signed)
Patient and wife called about an updated. I explained Steven Charles called and there was no way to leave a message. Patient left 2 number to call. [phone] and [phone]. Ask for this message to be urgent

## 2019-08-20 ENCOUNTER — Telehealth: Payer: Self-pay | Admitting: Orthopaedic Surgery

## 2019-08-20 NOTE — Telephone Encounter (Signed)
Called patient no answer and no answering machine pickup   Will try back later. 

## 2019-08-20 NOTE — Telephone Encounter (Signed)
Can we get him on the schedule at some point (next available, not a work-in) ? We need to do a face-to-face with him before we can order a hover round for him

## 2019-08-22 ENCOUNTER — Other Ambulatory Visit: Payer: Self-pay | Admitting: Internal Medicine

## 2019-08-29 ENCOUNTER — Encounter: Payer: Self-pay | Admitting: Orthopaedic Surgery

## 2019-08-29 ENCOUNTER — Ambulatory Visit (INDEPENDENT_AMBULATORY_CARE_PROVIDER_SITE_OTHER): Payer: Medicare HMO

## 2019-08-29 ENCOUNTER — Other Ambulatory Visit: Payer: Self-pay

## 2019-08-29 ENCOUNTER — Ambulatory Visit: Payer: Medicare HMO | Admitting: Orthopaedic Surgery

## 2019-08-29 DIAGNOSIS — Z89612 Acquired absence of left leg above knee: Secondary | ICD-10-CM

## 2019-08-29 DIAGNOSIS — S78112A Complete traumatic amputation at level between left hip and knee, initial encounter: Secondary | ICD-10-CM | POA: Insufficient documentation

## 2019-08-29 MED ORDER — TIZANIDINE HCL 4 MG PO TABS: 4.0000 mg | ORAL_TABLET | Freq: Every day | ORAL | 1 refills | Status: DC

## 2019-08-29 NOTE — Progress Notes (Addendum)
The patient is well-known to me.  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  He is now 74 years old.  He is now unable to wear his left above-knee amputation prosthesis due to the energy requirements today takes to get into the prosthesis.  This had to be a little bit of a higher AKA so the prosthesis is is not fit as well.  He did have a fall recently and has become more of a fall risk.  At this point he is the perfect candidate for a power mobility device such as those provided by [employer].  I talked to him in length and discussed this with his wife.  I believe this face-to-face meeting today will be helpful in order for him to qualify for a power mobility device.  His house will certainly accommodate this as well.  Other issues that limit his mobility include a history of a left hip fracture.  He is also significantly obese.  He has had issues with back pain and again has had recent falls.  Given the fact that he now cannot fit into his prosthesis, he cannot walk and normally has to get around in a wheelchair.  His wife has significant COPD and now cannot push him in his wheelchair.  His functional abilities now are quite limited given the fact that he cannot mobilize well with a standard wheelchair.  Examination of his left lower extremity shows that the end of the stump is intact with no open wounds were evidence of infection.  It is a higher AKA.  He has limited hip motion as well and I can see easily why he cannot fit in a above-knee amputation prosthesis at this standpoint.  Examination of his upper extremities shows that he has weakness with shoulder abduction and reaching overhead.  He has weak grip strength bilaterally.  He has good muscle tone but slightly decreased sensation in his hands.  He is a diabetic but reports good blood glucose control.  At this point I do feel that he is the perfect candidate for a power  mobility device.  I do feel that it is medically necessary for him to have this type of device.  He cannot use a cane or walker due to the fact that he cannot walk given the fact that he has a left above-knee amputation.  He is a significant fall risk.  A power mobility device will be necessary for him to be able to do simple activities daily living such as getting to the kitchen to prepare meals or getting to the bathroom for bathing and toilet activities.  It is also necessary for him to have this however the device to get to his bedroom for dressing.  Again, he is not a candidate for just a cane or walker due to the fact that he will is only one leg.  Given his upper extremity weakness and his obesity, he is not able to use a manual wheelchair and his wife given her medical issues to not push him in a manual wheelchair.  A power mobility device will be more accommodating and he can sit in this more successfully to allow him to have better quality of life and improve mobility in general.  Of note, the patient cannot use a scooter due to the fact that a scooter will not address his postural stability the way a power mobility device will.  Due to his obesity, a scooter cannot address his lack of postural stability.  I would also be concerned with him developing pressure sores from the seating area of a scooter.  A power mobility device will allow better seating for him.  Once again, he is a perfect candidate for a power mobility device given his significant disabilities.

## 2019-09-10 ENCOUNTER — Telehealth: Payer: Self-pay | Admitting: Orthopaedic Surgery

## 2019-09-10 NOTE — Telephone Encounter (Signed)
Steven Charles with [employer] called stating they received the cover letter but were missing the appt notes, and the wriiten order (prescriptions)  Steven Charles CB# [phone] Fax# [phone]  Reference# [id number]

## 2019-09-11 NOTE — Telephone Encounter (Signed)
All I have still is a letter from March for why patient needs chair,this should be in chart. I don't have anything in a folder.   I know previously back in March patient had not been seen since Oct. 2018 and needed an updated office visit. Looks like there may have been a visit on 08/29/19 now.

## 2019-09-11 NOTE — Telephone Encounter (Signed)
Could this paperwork/order still be in your fax place where we faxed it? Or does it not work like that anymore? I looked for this in my "label" folder and it wasn't there. Just checking with you before I make another

## 2019-09-11 NOTE — Telephone Encounter (Signed)
Faxed to [facility]

## 2019-09-11 NOTE — Telephone Encounter (Signed)
Spoke with you on phone. I do not have any recent orders or faxes for patient.

## 2019-09-11 NOTE — Telephone Encounter (Signed)
It was the actual "[employer]" papers we filled out after that visit that we faxed

## 2019-09-23 ENCOUNTER — Telehealth: Payer: Self-pay | Admitting: Orthopaedic Surgery

## 2019-09-23 NOTE — Telephone Encounter (Signed)
Patient called advised [employer] states they do not have an authorization on file for the electric wheelchair.  The fax #  For [employer] [phone] The number to contact patient is [phone]

## 2019-09-23 NOTE — Telephone Encounter (Signed)
[employer] called.   They said they faxed over the document mentioned in the previous message.   Call back: [phone] Reference number: [id number]

## 2019-10-10 DIAGNOSIS — Z961 Presence of intraocular lens: Secondary | ICD-10-CM | POA: Diagnosis not present

## 2019-10-10 DIAGNOSIS — H0288A Meibomian gland dysfunction right eye, upper and lower eyelids: Secondary | ICD-10-CM | POA: Diagnosis not present

## 2019-10-10 DIAGNOSIS — H0288B Meibomian gland dysfunction left eye, upper and lower eyelids: Secondary | ICD-10-CM | POA: Diagnosis not present

## 2019-10-10 DIAGNOSIS — E119 Type 2 diabetes mellitus without complications: Secondary | ICD-10-CM | POA: Diagnosis not present

## 2019-10-10 DIAGNOSIS — Z0100 Encounter for examination of eyes and vision without abnormal findings: Secondary | ICD-10-CM | POA: Diagnosis not present

## 2019-10-10 LAB — HM DIABETES EYE EXAM

## 2019-10-14 DIAGNOSIS — S78112S Complete traumatic amputation at level between left hip and knee, sequela: Secondary | ICD-10-CM | POA: Diagnosis not present

## 2019-10-14 DIAGNOSIS — M545 Low back pain: Secondary | ICD-10-CM | POA: Diagnosis not present

## 2019-10-14 DIAGNOSIS — E119 Type 2 diabetes mellitus without complications: Secondary | ICD-10-CM | POA: Diagnosis not present

## 2019-10-19 ENCOUNTER — Other Ambulatory Visit: Payer: Self-pay | Admitting: Orthopaedic Surgery

## 2019-10-29 ENCOUNTER — Encounter: Payer: Self-pay | Admitting: Internal Medicine

## 2019-11-12 ENCOUNTER — Ambulatory Visit: Payer: Self-pay

## 2019-11-12 DIAGNOSIS — R3121 Asymptomatic microscopic hematuria: Secondary | ICD-10-CM | POA: Diagnosis not present

## 2019-11-13 ENCOUNTER — Other Ambulatory Visit: Payer: Self-pay

## 2019-11-13 ENCOUNTER — Ambulatory Visit (INDEPENDENT_AMBULATORY_CARE_PROVIDER_SITE_OTHER): Payer: Medicare HMO

## 2019-11-13 VITALS — BP 128/60 | HR 57 | Temp 98.3°F | Ht 70.0 in | Wt 329.6 lb

## 2019-11-13 DIAGNOSIS — Z0000 Encounter for general adult medical examination without abnormal findings: Secondary | ICD-10-CM | POA: Diagnosis not present
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
## 2019-11-13 NOTE — Patient Instructions (Signed)
Steven Charles , Thank you for taking time to come for your Medicare Wellness Visit. I appreciate your ongoing commitment to your health goals. Please review the following plan we discussed and let me know if I can assist you in the future.   Screening recommendations/referrals: Colonoscopy: 11/21/2016; no repeat due to age Recommended yearly ophthalmology/optometry visit for glaucoma screening and checkup Recommended yearly dental visit for hygiene and checkup  Vaccinations: Influenza vaccine: overdue Pneumococcal vaccine: completed Tdap vaccine: 06/06/2018; due every 10 years Shingles vaccine: never done   Covid-19: completed  Advanced directives: Advance directive discussed with you today. Even though you declined this today please call our office should you change your mind and we can give you the proper paperwork for you to fill out.  Conditions/risks identified: Yes; Please continue to do your personal lifestyle choices by: daily care of teeth and gums, regular physical activity (goal should be 5 days a week for 30 minutes), eat a healthy diet, avoid tobacco and drug use, limiting any alcohol intake, taking a low-dose aspirin (if not allergic or have been advised by your provider otherwise) and taking vitamins and minerals as recommended by your provider. Continue doing brain stimulating activities (puzzles, reading, adult coloring books, staying active) to keep memory sharp. Continue to eat heart healthy diet (full of fruits, vegetables, whole grains, lean protein, water--limit salt, fat, and sugar intake) and increase physical activity as tolerated.  Next appointment: Please schedule your next Medicare Wellness Visit with your Nurse Health Advisor in 1 year.  Preventive Care 37 Years and Older, Male Preventive care refers to lifestyle choices and visits with your health care provider that can promote health and wellness. What does preventive care include?  A yearly physical exam. This is  also called an annual well check.  Dental exams once or twice a year.  Routine eye exams. Ask your health care provider how often you should have your eyes checked.  Personal lifestyle choices, including:  Daily care of your teeth and gums.  Regular physical activity.  Eating a healthy diet.  Avoiding tobacco and drug use.  Limiting alcohol use.  Practicing safe sex.  Taking low doses of aspirin every day.  Taking vitamin and mineral supplements as recommended by your health care provider. What happens during an annual well check? The services and screenings done by your health care provider during your annual well check will depend on your age, overall health, lifestyle risk factors, and family history of disease. Counseling  Your health care provider may ask you questions about your:  Alcohol use.  Tobacco use.  Drug use.  Emotional well-being.  Home and relationship well-being.  Sexual activity.  Eating habits.  History of falls.  Memory and ability to understand (cognition).  Work and work Statistician. Screening  You may have the following tests or measurements:  Height, weight, and BMI.  Blood pressure.  Lipid and cholesterol levels. These may be checked every 5 years, or more frequently if you are over 66 years old.  Skin check.  Lung cancer screening. You may have this screening every year starting at age 72 if you have a 30-pack-year history of smoking and currently smoke or have quit within the past 15 years.  Fecal occult blood test (FOBT) of the stool. You may have this test every year starting at age 20.  Flexible sigmoidoscopy or colonoscopy. You may have a sigmoidoscopy every 5 years or a colonoscopy every 10 years starting at age 72.  Prostate cancer  screening. Recommendations will vary depending on your family history and other risks.  Hepatitis C blood test.  Hepatitis B blood test.  Sexually transmitted disease (STD)  testing.  Diabetes screening. This is done by checking your blood sugar (glucose) after you have not eaten for a while (fasting). You may have this done every 1-3 years.  Abdominal aortic aneurysm (AAA) screening. You may need this if you are a current or former smoker.  Osteoporosis. You may be screened starting at age 14 if you are at high risk. Talk with your health care provider about your test results, treatment options, and if necessary, the need for more tests. Vaccines  Your health care provider may recommend certain vaccines, such as:  Influenza vaccine. This is recommended every year.  Tetanus, diphtheria, and acellular pertussis (Tdap, Td) vaccine. You may need a Td booster every 10 years.  Zoster vaccine. You may need this after age 26.  Pneumococcal 13-valent conjugate (PCV13) vaccine. One dose is recommended after age 56.  Pneumococcal polysaccharide (PPSV23) vaccine. One dose is recommended after age 17. Talk to your health care provider about which screenings and vaccines you need and how often you need them. This information is not intended to replace advice given to you by your health care provider. Make sure you discuss any questions you have with your health care provider. Document Released: 04/10/2015 Document Revised: 12/02/2015 Document Reviewed: 01/13/2015 Elsevier Interactive Patient Education  2017 [place] Prevention in the Home Falls can cause injuries. They can happen to people of all ages. There are many things you can do to make your home safe and to help prevent falls. What can I do on the outside of my home?  Regularly fix the edges of walkways and driveways and fix any cracks.  Remove anything that might make you trip as you walk through a door, such as a raised step or threshold.  Trim any bushes or trees on the path to your home.  Use bright outdoor lighting.  Clear any walking paths of anything that might make someone trip, such as  rocks or tools.  Regularly check to see if handrails are loose or broken. Make sure that both sides of any steps have handrails.  Any raised decks and porches should have guardrails on the edges.  Have any leaves, snow, or ice cleared regularly.  Use sand or salt on walking paths during winter.  Clean up any spills in your garage right away. This includes oil or grease spills. What can I do in the bathroom?  Use night lights.  Install grab bars by the toilet and in the tub and shower. Do not use towel bars as grab bars.  Use non-skid mats or decals in the tub or shower.  If you need to sit down in the shower, use a plastic, non-slip stool.  Keep the floor dry. Clean up any water that spills on the floor as soon as it happens.  Remove soap buildup in the tub or shower regularly.  Attach bath mats securely with double-sided non-slip rug tape.  Do not have throw rugs and other things on the floor that can make you trip. What can I do in the bedroom?  Use night lights.  Make sure that you have a light by your bed that is easy to reach.  Do not use any sheets or blankets that are too big for your bed. They should not hang down onto the floor.  Have a firm  chair that has side arms. You can use this for support while you get dressed.  Do not have throw rugs and other things on the floor that can make you trip. What can I do in the kitchen?  Clean up any spills right away.  Avoid walking on wet floors.  Keep items that you use a lot in easy-to-reach places.  If you need to reach something above you, use a strong step stool that has a grab bar.  Keep electrical cords out of the way.  Do not use floor polish or wax that makes floors slippery. If you must use wax, use non-skid floor wax.  Do not have throw rugs and other things on the floor that can make you trip. What can I do with my stairs?  Do not leave any items on the stairs.  Make sure that there are handrails on  both sides of the stairs and use them. Fix handrails that are broken or loose. Make sure that handrails are as long as the stairways.  Check any carpeting to make sure that it is firmly attached to the stairs. Fix any carpet that is loose or worn.  Avoid having throw rugs at the top or bottom of the stairs. If you do have throw rugs, attach them to the floor with carpet tape.  Make sure that you have a light switch at the top of the stairs and the bottom of the stairs. If you do not have them, ask someone to add them for you. What else can I do to help prevent falls?  Wear shoes that:  Do not have high heels.  Have rubber bottoms.  Are comfortable and fit you well.  Are closed at the toe. Do not wear sandals.  If you use a stepladder:  Make sure that it is fully opened. Do not climb a closed stepladder.  Make sure that both sides of the stepladder are locked into place.  Ask someone to hold it for you, if possible.  Clearly mark and make sure that you can see:  Any grab bars or handrails.  First and last steps.  Where the edge of each step is.  Use tools that help you move around (mobility aids) if they are needed. These include:  Canes.  Walkers.  Scooters.  Crutches.  Turn on the lights when you go into a dark area. Replace any light bulbs as soon as they burn out.  Set up your furniture so you have a clear path. Avoid moving your furniture around.  If any of your floors are uneven, fix them.  If there are any pets around you, be aware of where they are.  Review your medicines with your doctor. Some medicines can make you feel dizzy. This can increase your chance of falling. Ask your doctor what other things that you can do to help prevent falls. This information is not intended to replace advice given to you by your health care provider. Make sure you discuss any questions you have with your health care provider. Document Released: 01/08/2009 Document  Revised: 08/20/2015 Document Reviewed: 04/18/2014 Elsevier Interactive Patient Education  2017 [employer].

## 2019-11-14 ENCOUNTER — Telehealth: Payer: Self-pay | Admitting: Internal Medicine

## 2019-11-14 DIAGNOSIS — S78112S Complete traumatic amputation at level between left hip and knee, sequela: Secondary | ICD-10-CM | POA: Diagnosis not present

## 2019-11-14 DIAGNOSIS — M545 Low back pain: Secondary | ICD-10-CM | POA: Diagnosis not present

## 2019-11-14 DIAGNOSIS — E119 Type 2 diabetes mellitus without complications: Secondary | ICD-10-CM | POA: Diagnosis not present

## 2019-11-14 NOTE — Progress Notes (Signed)
  Chronic Care Management   Outreach Note  11/14/2019 Name: RODDIE RIEGLER MRN: [id number] DOB: 07/19/1945  Referred by: Biagio Borg, MD Reason for referral : No chief complaint on file.   An unsuccessful telephone outreach was attempted today. The patient was referred to the pharmacist for assistance with care management and care coordination.   Follow Up Plan:   Earney Hamburg Upstream Scheduler

## 2019-11-19 ENCOUNTER — Telehealth: Payer: Self-pay | Admitting: Internal Medicine

## 2019-11-19 NOTE — Progress Notes (Signed)
  Chronic Care Management   Outreach Note  11/19/2019 Name: Steven Charles MRN: [id number] DOB: 06/19/1945  Referred by: Biagio Borg, MD Reason for referral : No chief complaint on file.   An unsuccessful telephone outreach was attempted today. The patient was referred to the pharmacist for assistance with care management and care coordination.   Follow Up Plan:   Earney Hamburg Upstream Scheduler

## 2019-11-21 ENCOUNTER — Telehealth: Payer: Self-pay | Admitting: Internal Medicine

## 2019-11-21 DIAGNOSIS — N261 Atrophy of kidney (terminal): Secondary | ICD-10-CM | POA: Diagnosis not present

## 2019-11-21 DIAGNOSIS — N200 Calculus of kidney: Secondary | ICD-10-CM | POA: Diagnosis not present

## 2019-11-21 DIAGNOSIS — K8020 Calculus of gallbladder without cholecystitis without obstruction: Secondary | ICD-10-CM | POA: Diagnosis not present

## 2019-11-21 DIAGNOSIS — I700 Atherosclerosis of aorta: Secondary | ICD-10-CM | POA: Diagnosis not present

## 2019-11-21 DIAGNOSIS — R3121 Asymptomatic microscopic hematuria: Secondary | ICD-10-CM | POA: Diagnosis not present

## 2019-11-21 NOTE — Progress Notes (Signed)
  Chronic Care Management   Note  11/21/2019 Name: Steven Charles MRN: [id number] DOB: 1945-11-30  Steven Charles is a 74 y.o. year old male who is a primary care patient of Biagio Borg, MD. I reached out to [name] by phone today in response to a referral sent by [name]'s PCP, Biagio Borg, MD.   [name] was given information about Chronic Care Management services today including:  1. CCM service includes personalized support from designated clinical staff supervised by his physician, including individualized plan of care and coordination with other care providers 2. 24/7 contact phone numbers for assistance for urgent and routine care needs. 3. Service will only be billed when office clinical staff spend 20 minutes or more in a month to coordinate care. 4. Only one practitioner may furnish and bill the service in a calendar month. 5. The patient may stop CCM services at any time (effective at the end of the month) by phone call to the office staff.   Patient agreed to services and verbal consent obtained.   Follow up plan:   SIGNATURE

## 2019-12-04 DIAGNOSIS — R3121 Asymptomatic microscopic hematuria: Secondary | ICD-10-CM | POA: Diagnosis not present

## 2019-12-15 DIAGNOSIS — M545 Low back pain: Secondary | ICD-10-CM | POA: Diagnosis not present

## 2019-12-15 DIAGNOSIS — E119 Type 2 diabetes mellitus without complications: Secondary | ICD-10-CM | POA: Diagnosis not present

## 2019-12-15 DIAGNOSIS — S78112S Complete traumatic amputation at level between left hip and knee, sequela: Secondary | ICD-10-CM | POA: Diagnosis not present

## 2019-12-18 ENCOUNTER — Other Ambulatory Visit: Payer: Self-pay | Admitting: Internal Medicine

## 2019-12-24 ENCOUNTER — Other Ambulatory Visit: Payer: Self-pay | Admitting: Orthopaedic Surgery

## 2019-12-24 NOTE — Telephone Encounter (Signed)
Please advise thanks.

## 2020-01-03 ENCOUNTER — Telehealth: Payer: Medicare HMO

## 2020-01-14 DIAGNOSIS — S78112S Complete traumatic amputation at level between left hip and knee, sequela: Secondary | ICD-10-CM | POA: Diagnosis not present

## 2020-01-14 DIAGNOSIS — N401 Enlarged prostate with lower urinary tract symptoms: Secondary | ICD-10-CM | POA: Diagnosis not present

## 2020-01-14 DIAGNOSIS — M545 Low back pain, unspecified: Secondary | ICD-10-CM | POA: Diagnosis not present

## 2020-01-14 DIAGNOSIS — E119 Type 2 diabetes mellitus without complications: Secondary | ICD-10-CM | POA: Diagnosis not present

## 2020-01-14 DIAGNOSIS — N3281 Overactive bladder: Secondary | ICD-10-CM | POA: Diagnosis not present

## 2020-01-28 ENCOUNTER — Other Ambulatory Visit: Payer: Self-pay

## 2020-01-28 ENCOUNTER — Telehealth: Payer: Self-pay | Admitting: Internal Medicine

## 2020-01-28 MED ORDER — LEVOTHYROXINE SODIUM 75 MCG PO TABS: 75.0000 ug | ORAL_TABLET | Freq: Every day | ORAL | 3 refills | Status: DC

## 2020-01-28 NOTE — Telephone Encounter (Signed)
levothyroxine (SYNTHROID) 75 MCG tablet [id number] - [place], [place] - [address] Phone:  [phone]  Fax:  [phone]     Requesting a refill

## 2020-01-29 ENCOUNTER — Ambulatory Visit: Payer: Medicare HMO | Admitting: Internal Medicine

## 2020-02-04 ENCOUNTER — Telehealth: Payer: Self-pay | Admitting: Internal Medicine

## 2020-02-04 MED ORDER — LORAZEPAM 1 MG PO TABS
ORAL_TABLET | ORAL | 0 refills | Status: DC
Start: 2020-02-04 — End: 2020-02-28

## 2020-02-04 NOTE — Telephone Encounter (Signed)
   Refill request for LORazepam (ATIVAN) 1 MG tablet Patient has no medication remaining Pharmacy:[id number] - [place], [place] - [address] Last seen 07/29/19

## 2020-02-13 ENCOUNTER — Other Ambulatory Visit: Payer: Self-pay

## 2020-02-13 ENCOUNTER — Ambulatory Visit (INDEPENDENT_AMBULATORY_CARE_PROVIDER_SITE_OTHER): Payer: Medicare HMO | Admitting: Internal Medicine

## 2020-02-13 ENCOUNTER — Encounter: Payer: Self-pay | Admitting: Internal Medicine

## 2020-02-13 VITALS — BP 150/70 | HR 48 | Temp 99.2°F | Ht 70.0 in | Wt 240.0 lb

## 2020-02-13 DIAGNOSIS — I10 Essential (primary) hypertension: Secondary | ICD-10-CM | POA: Diagnosis not present

## 2020-02-13 DIAGNOSIS — F3289 Other specified depressive episodes: Secondary | ICD-10-CM

## 2020-02-13 DIAGNOSIS — Z23 Encounter for immunization: Secondary | ICD-10-CM | POA: Diagnosis not present

## 2020-02-13 DIAGNOSIS — E11649 Type 2 diabetes mellitus with hypoglycemia without coma: Secondary | ICD-10-CM | POA: Diagnosis not present

## 2020-02-13 DIAGNOSIS — J3089 Other allergic rhinitis: Secondary | ICD-10-CM

## 2020-02-13 DIAGNOSIS — M109 Gout, unspecified: Secondary | ICD-10-CM | POA: Diagnosis not present

## 2020-02-13 LAB — POCT GLYCOSYLATED HEMOGLOBIN (HGB A1C): Hemoglobin A1C: 5.5 % (ref 4.0–5.6)

## 2020-02-13 MED ORDER — PREDNISONE 10 MG PO TABS: ORAL_TABLET | ORAL | 0 refills | Status: DC

## 2020-02-13 MED ORDER — METHYLPREDNISOLONE ACETATE 80 MG/ML IJ SUSP
80.0000 mg | Freq: Once | INTRAMUSCULAR | Status: AC
  Administered 2020-02-13: 80 mg via INTRAMUSCULAR

## 2020-02-13 NOTE — Patient Instructions (Addendum)
Please sign up for the booster shot at [facility] asap  You had the flu shot today  Your A1c was OK today  You had the steroid shot today  Please take all new medication as prescribed - the prednisone for the gout in the right hand  Please call if you change your mind about medication treatment for the situational depression  Please continue all other medications as before, and refills have been done if requested.  Please have the pharmacy call with any other refills you may need.  Please continue your efforts at being more active, low cholesterol diet, and weight control.  Please keep your appointments with your specialists as you may have planned  Please make an Appointment to return in 6 months, or sooner if needed
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
## 2020-02-14 DIAGNOSIS — S78112S Complete traumatic amputation at level between left hip and knee, sequela: Secondary | ICD-10-CM | POA: Diagnosis not present

## 2020-02-14 DIAGNOSIS — E119 Type 2 diabetes mellitus without complications: Secondary | ICD-10-CM | POA: Diagnosis not present

## 2020-02-14 DIAGNOSIS — M545 Low back pain, unspecified: Secondary | ICD-10-CM | POA: Diagnosis not present

## 2020-02-15 ENCOUNTER — Encounter: Payer: Self-pay | Admitting: Internal Medicine

## 2020-02-15 DIAGNOSIS — M109 Gout, unspecified: Secondary | ICD-10-CM | POA: Insufficient documentation

## 2020-02-15 NOTE — Assessment & Plan Note (Signed)
stable overall by history and exam, recent data reviewed with pt, and pt to continue medical treatment as before,  to f/u any worsening symptoms or concerns  

## 2020-02-15 NOTE — Assessment & Plan Note (Signed)
Also likely to improve with steroid tx

## 2020-02-15 NOTE — Assessment & Plan Note (Signed)
Mild worsening, stable overall by history and exam, recent data reviewed with pt, and pt to continue medical treatment as before,  to f/u any worsening symptoms or concerns

## 2020-02-15 NOTE — Assessment & Plan Note (Addendum)
For depomedrol im80, predpac asd,  to f/u any worsening symptoms or concerns  I spent 31 minutes in preparing to see the patient by review of recent labs, imaging and procedures, obtaining and reviewing separately obtained history, communicating with the patient and family or caregiver, ordering medications, tests or procedures, and documenting clinical information in the EHR including the differential Dx, treatment, and any further evaluation and other management of acute gout, dm, htn depression, allergies

## 2020-02-23 ENCOUNTER — Other Ambulatory Visit: Payer: Self-pay | Admitting: Orthopaedic Surgery

## 2020-02-24 NOTE — Telephone Encounter (Signed)
Ok to refill 

## 2020-02-27 ENCOUNTER — Other Ambulatory Visit: Payer: Self-pay | Admitting: Internal Medicine

## 2020-02-28 NOTE — Telephone Encounter (Signed)
Done erx 

## 2020-03-15 DIAGNOSIS — S78112S Complete traumatic amputation at level between left hip and knee, sequela: Secondary | ICD-10-CM | POA: Diagnosis not present

## 2020-03-15 DIAGNOSIS — E119 Type 2 diabetes mellitus without complications: Secondary | ICD-10-CM | POA: Diagnosis not present

## 2020-03-15 DIAGNOSIS — M545 Low back pain, unspecified: Secondary | ICD-10-CM | POA: Diagnosis not present

## 2020-03-27 ENCOUNTER — Other Ambulatory Visit: Payer: Self-pay | Admitting: Orthopaedic Surgery

## 2020-05-04 DIAGNOSIS — M545 Low back pain, unspecified: Secondary | ICD-10-CM | POA: Diagnosis not present

## 2020-05-04 DIAGNOSIS — E119 Type 2 diabetes mellitus without complications: Secondary | ICD-10-CM | POA: Diagnosis not present

## 2020-05-04 DIAGNOSIS — S78112S Complete traumatic amputation at level between left hip and knee, sequela: Secondary | ICD-10-CM | POA: Diagnosis not present

## 2020-05-22 ENCOUNTER — Telehealth: Payer: Self-pay | Admitting: Internal Medicine

## 2020-05-22 MED ORDER — GABAPENTIN 300 MG PO CAPS: ORAL_CAPSULE | ORAL | 1 refills | Status: DC

## 2020-05-22 NOTE — Telephone Encounter (Signed)
Done erx 

## 2020-05-22 NOTE — Telephone Encounter (Signed)
1.Medication Requested: gabapentin (NEURONTIN) 300 MG capsule    2. Pharmacy (Name, Street, [place]): [place], [place] - [address]  3. On Med List: yes   4. Last Visit with PCP: 11.18.21  5. Next visit date with PCP: n/a    Agent: Please be advised that RX refills may take up to 3 business days. We ask that you follow-up with your pharmacy.

## 2020-06-01 DIAGNOSIS — S78112S Complete traumatic amputation at level between left hip and knee, sequela: Secondary | ICD-10-CM | POA: Diagnosis not present

## 2020-06-01 DIAGNOSIS — E119 Type 2 diabetes mellitus without complications: Secondary | ICD-10-CM | POA: Diagnosis not present

## 2020-06-01 DIAGNOSIS — M545 Low back pain, unspecified: Secondary | ICD-10-CM | POA: Diagnosis not present

## 2020-06-11 ENCOUNTER — Other Ambulatory Visit: Payer: Self-pay | Admitting: Internal Medicine

## 2020-06-18 ENCOUNTER — Other Ambulatory Visit: Payer: Self-pay | Admitting: Internal Medicine

## 2020-07-02 DIAGNOSIS — M545 Low back pain, unspecified: Secondary | ICD-10-CM | POA: Diagnosis not present

## 2020-07-02 DIAGNOSIS — E119 Type 2 diabetes mellitus without complications: Secondary | ICD-10-CM | POA: Diagnosis not present

## 2020-07-02 DIAGNOSIS — S78112S Complete traumatic amputation at level between left hip and knee, sequela: Secondary | ICD-10-CM | POA: Diagnosis not present

## 2020-07-18 DIAGNOSIS — R5381 Other malaise: Secondary | ICD-10-CM | POA: Diagnosis not present

## 2020-07-18 DIAGNOSIS — W19XXXA Unspecified fall, initial encounter: Secondary | ICD-10-CM | POA: Diagnosis not present

## 2020-07-18 DIAGNOSIS — R69 Illness, unspecified: Secondary | ICD-10-CM | POA: Diagnosis not present

## 2020-08-01 DIAGNOSIS — S78112S Complete traumatic amputation at level between left hip and knee, sequela: Secondary | ICD-10-CM | POA: Diagnosis not present

## 2020-08-01 DIAGNOSIS — E119 Type 2 diabetes mellitus without complications: Secondary | ICD-10-CM | POA: Diagnosis not present

## 2020-08-01 DIAGNOSIS — M545 Low back pain, unspecified: Secondary | ICD-10-CM | POA: Diagnosis not present

## 2020-08-02 ENCOUNTER — Other Ambulatory Visit: Payer: Self-pay | Admitting: Internal Medicine

## 2020-08-02 NOTE — Telephone Encounter (Signed)
Please refill as per office routine med refill policy (all routine meds refilled for 3 mo or monthly per pt preference up to one year from last visit, then month to month grace period for 3 mo, then further med refills will have to be denied)  

## 2020-08-03 NOTE — Telephone Encounter (Signed)
Contacted pt to schedule 73m follow up. Pt will call back

## 2020-08-06 ENCOUNTER — Ambulatory Visit (INDEPENDENT_AMBULATORY_CARE_PROVIDER_SITE_OTHER): Payer: Medicare Other | Admitting: Internal Medicine

## 2020-08-06 ENCOUNTER — Other Ambulatory Visit: Payer: Self-pay

## 2020-08-06 ENCOUNTER — Encounter: Payer: Self-pay | Admitting: Internal Medicine

## 2020-08-06 ENCOUNTER — Other Ambulatory Visit: Payer: Self-pay | Admitting: Internal Medicine

## 2020-08-06 VITALS — BP 148/70 | HR 72 | Temp 98.6°F | Ht 70.0 in

## 2020-08-06 DIAGNOSIS — S78112A Complete traumatic amputation at level between left hip and knee, initial encounter: Secondary | ICD-10-CM

## 2020-08-06 DIAGNOSIS — E11649 Type 2 diabetes mellitus with hypoglycemia without coma: Secondary | ICD-10-CM

## 2020-08-06 DIAGNOSIS — G609 Hereditary and idiopathic neuropathy, unspecified: Secondary | ICD-10-CM

## 2020-08-06 DIAGNOSIS — G4710 Hypersomnia, unspecified: Secondary | ICD-10-CM | POA: Diagnosis not present

## 2020-08-06 DIAGNOSIS — I5032 Chronic diastolic (congestive) heart failure: Secondary | ICD-10-CM

## 2020-08-06 DIAGNOSIS — R296 Repeated falls: Secondary | ICD-10-CM | POA: Insufficient documentation

## 2020-08-06 DIAGNOSIS — E559 Vitamin D deficiency, unspecified: Secondary | ICD-10-CM

## 2020-08-06 DIAGNOSIS — E538 Deficiency of other specified B group vitamins: Secondary | ICD-10-CM | POA: Diagnosis not present

## 2020-08-06 DIAGNOSIS — Z0001 Encounter for general adult medical examination with abnormal findings: Secondary | ICD-10-CM | POA: Diagnosis not present

## 2020-08-06 DIAGNOSIS — R2689 Other abnormalities of gait and mobility: Secondary | ICD-10-CM

## 2020-08-06 LAB — CBC WITH DIFFERENTIAL/PLATELET
Basophils Absolute: 0 10*3/uL (ref 0.0–0.1)
Basophils Relative: 0.2 % (ref 0.0–3.0)
Eosinophils Absolute: 0.2 10*3/uL (ref 0.0–0.7)
Eosinophils Relative: 2.9 % (ref 0.0–5.0)
HCT: 41.3 % (ref 39.0–52.0)
Hemoglobin: 14.6 g/dL (ref 13.0–17.0)
Lymphocytes Relative: 22.1 % (ref 12.0–46.0)
Lymphs Abs: 1.7 10*3/uL (ref 0.7–4.0)
MCHC: 35.3 g/dL (ref 30.0–36.0)
MCV: 98.4 fl (ref 78.0–100.0)
Monocytes Absolute: 0.6 10*3/uL (ref 0.1–1.0)
Monocytes Relative: 7.8 % (ref 3.0–12.0)
Neutro Abs: 5.3 10*3/uL (ref 1.4–7.7)
Neutrophils Relative %: 67 % (ref 43.0–77.0)
Platelets: 138 10*3/uL — ABNORMAL LOW (ref 150.0–400.0)
RBC: 4.2 Mil/uL — ABNORMAL LOW (ref 4.22–5.81)
RDW: 12.9 % (ref 11.5–15.5)
WBC: 7.9 10*3/uL (ref 4.0–10.5)

## 2020-08-06 LAB — LIPID PANEL
Cholesterol: 122 mg/dL (ref 0–200)
HDL: 38.3 mg/dL — ABNORMAL LOW (ref >39.00)
LDL Cholesterol: 55 mg/dL (ref 0–99)
NonHDL: 83.79
Total CHOL/HDL Ratio: 3
Triglycerides: 144 mg/dL (ref 0.0–149.0)
VLDL: 28.8 mg/dL (ref 0.0–40.0)

## 2020-08-06 LAB — BASIC METABOLIC PANEL
BUN: 23 mg/dL (ref 6–23)
CO2: 28 mEq/L (ref 19–32)
Calcium: 10.3 mg/dL (ref 8.4–10.5)
Chloride: 102 mEq/L (ref 96–112)
Creatinine, Ser: 1.35 mg/dL (ref 0.40–1.50)
GFR: 50.83 mL/min — ABNORMAL LOW (ref >60.00)
Glucose, Bld: 86 mg/dL (ref 70–99)
Potassium: 4.3 mEq/L (ref 3.5–5.1)
Sodium: 137 mEq/L (ref 135–145)

## 2020-08-06 LAB — URINALYSIS, ROUTINE W REFLEX MICROSCOPIC
Bilirubin Urine: NEGATIVE
Hgb urine dipstick: NEGATIVE
Ketones, ur: NEGATIVE
Nitrite: NEGATIVE
Specific Gravity, Urine: 1.015 (ref 1.000–1.030)
Total Protein, Urine: NEGATIVE
Urine Glucose: NEGATIVE
Urobilinogen, UA: 1 (ref 0.0–1.0)
pH: 7 (ref 5.0–8.0)

## 2020-08-06 LAB — HEPATIC FUNCTION PANEL
ALT: 36 U/L (ref 0–53)
AST: 30 U/L (ref 0–37)
Albumin: 4 g/dL (ref 3.5–5.2)
Alkaline Phosphatase: 85 U/L (ref 39–117)
Bilirubin, Direct: 0.2 mg/dL (ref 0.0–0.3)
Total Bilirubin: 0.8 mg/dL (ref 0.2–1.2)
Total Protein: 6.6 g/dL (ref 6.0–8.3)

## 2020-08-06 LAB — TSH: TSH: 4.1 u[IU]/mL (ref 0.35–4.50)

## 2020-08-06 LAB — MICROALBUMIN / CREATININE URINE RATIO
Creatinine,U: 144.4 mg/dL
Microalb Creat Ratio: 0.5 mg/g (ref 0.0–30.0)
Microalb, Ur: <0.7 mg/dL (ref 0.0–1.9)

## 2020-08-06 LAB — PSA: PSA: 0.22 ng/mL (ref 0.10–4.00)

## 2020-08-06 LAB — HEMOGLOBIN A1C: Hgb A1c MFr Bld: 5.7 % (ref 4.6–6.5)

## 2020-08-06 LAB — VITAMIN B12: Vitamin B-12: >1550 pg/mL — ABNORMAL HIGH (ref 211–911)

## 2020-08-06 LAB — VITAMIN D 25 HYDROXY (VIT D DEFICIENCY, FRACTURES): VITD: 59.72 ng/mL (ref 30.00–100.00)

## 2020-08-06 MED ORDER — GABAPENTIN 300 MG PO CAPS: ORAL_CAPSULE | ORAL | 1 refills | Status: DC

## 2020-08-06 MED ORDER — CIPROFLOXACIN HCL 500 MG PO TABS: 500.0000 mg | ORAL_TABLET | Freq: Two times a day (BID) | ORAL | 0 refills | Status: AC

## 2020-08-06 NOTE — Assessment & Plan Note (Signed)
Also for PT with [facility] with RN

## 2020-08-06 NOTE — Assessment & Plan Note (Signed)
For HH with PT 
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
## 2020-08-06 NOTE — Patient Instructions (Signed)
Ok to incresae the gabapentin to 300 mg four times per day  You will be contacted regarding the referral for: Home Health with RN, PT and overnight oximetry x 1 time  Please continue all other medications as before, and refills have been done if requested.  Please have the pharmacy call with any other refills you may need.  Please continue your efforts at being more active, low cholesterol diet, and weight control.  You are otherwise up to date with prevention measures today.  Please keep your appointments with your specialists as you may have planned  Please go to the LAB at the blood drawing area for the tests to be done  You will be contacted by phone if any changes need to be made immediately.  Otherwise, you will receive a letter about your results with an explanation, but please check with MyChart first.  Please remember to sign up for MyChart if you have not done so, as this will be important to you in the future with finding out test results, communicating by private email, and scheduling acute appointments online when needed.  Please make an Appointment to return in 6 months, or sooner if needed

## 2020-08-06 NOTE — Assessment & Plan Note (Signed)
Needs to f/u with prosthetic fitting but declines

## 2020-08-06 NOTE — Assessment & Plan Note (Signed)
[place] for increased gabapentin 300 qid

## 2020-08-06 NOTE — Assessment & Plan Note (Signed)

## 2020-08-06 NOTE — Assessment & Plan Note (Signed)
For ONO with HH order,  to f/u any worsening symptoms or concerns

## 2020-08-06 NOTE — Assessment & Plan Note (Signed)
[place], cont current med tx

## 2020-08-06 NOTE — Assessment & Plan Note (Signed)
Lab Results  Component Value Date   HGBA1C 5.7 08/06/2020   Stable, pt to continue current medical treatment  - diet

## 2020-08-11 ENCOUNTER — Other Ambulatory Visit: Payer: Self-pay

## 2020-08-12 DIAGNOSIS — R404 Transient alteration of awareness: Secondary | ICD-10-CM | POA: Diagnosis not present

## 2020-08-23 ENCOUNTER — Other Ambulatory Visit: Payer: Self-pay | Admitting: Internal Medicine

## 2020-08-23 NOTE — Telephone Encounter (Signed)
Please refill as per office routine med refill policy (all routine meds refilled for 3 mo or monthly per pt preference up to one year from last visit, then month to month grace period for 3 mo, then further med refills will have to be denied)  

## 2020-09-01 DIAGNOSIS — S78112S Complete traumatic amputation at level between left hip and knee, sequela: Secondary | ICD-10-CM | POA: Diagnosis not present

## 2020-09-01 DIAGNOSIS — M545 Low back pain, unspecified: Secondary | ICD-10-CM | POA: Diagnosis not present

## 2020-09-01 DIAGNOSIS — E119 Type 2 diabetes mellitus without complications: Secondary | ICD-10-CM | POA: Diagnosis not present

## 2020-09-02 ENCOUNTER — Other Ambulatory Visit: Payer: Self-pay

## 2020-09-02 MED ORDER — EZETIMIBE 10 MG PO TABS: 10.0000 mg | ORAL_TABLET | Freq: Every day | ORAL | 3 refills | Status: DC

## 2020-09-17 ENCOUNTER — Other Ambulatory Visit: Payer: Self-pay | Admitting: Internal Medicine

## 2020-09-17 MED ORDER — LISINOPRIL 20 MG PO TABS: 20.0000 mg | ORAL_TABLET | Freq: Every day | ORAL | 3 refills | Status: DC

## 2020-09-17 MED ORDER — FLUOXETINE HCL 40 MG PO CAPS: 40.0000 mg | ORAL_CAPSULE | Freq: Every day | ORAL | 3 refills | Status: DC

## 2020-09-17 MED ORDER — CARVEDILOL 12.5 MG PO TABS: ORAL_TABLET | ORAL | 3 refills | Status: DC

## 2020-09-22 ENCOUNTER — Ambulatory Visit: Payer: Medicare HMO | Admitting: Cardiology

## 2020-09-22 ENCOUNTER — Telehealth: Payer: Self-pay | Admitting: Internal Medicine

## 2020-09-22 MED ORDER — PANTOPRAZOLE SODIUM 40 MG PO TBEC: 40.0000 mg | DELAYED_RELEASE_TABLET | Freq: Two times a day (BID) | ORAL | 3 refills | Status: DC

## 2020-09-22 MED ORDER — LORAZEPAM 1 MG PO TABS: 1.0000 mg | ORAL_TABLET | Freq: Two times a day (BID) | ORAL | 2 refills | Status: DC | PRN

## 2020-09-22 NOTE — Telephone Encounter (Signed)
Ok to let pt know  I sort of accidentally sent the protonix to [facility] order   And the xanax went to local [facility] - hope that is ok

## 2020-09-22 NOTE — Telephone Encounter (Signed)
1.Medication Requested: LORazepam (ATIVAN) 1 MG tablet  pantoprazole (PROTONIX) 40 MG tablet  2. Pharmacy (Name, Street, [place]): [place], [place] - [address]  3. On Med List: yes   4. Last Visit with PCP: 08-06-20  5. Next visit date with PCP: 02-08-21   Agent: Please be advised that RX refills may take up to 3 business days. We ask that you follow-up with your pharmacy.

## 2020-09-22 NOTE — Telephone Encounter (Signed)
PMP done; Please advise  Last filled 08/18/20

## 2020-09-23 ENCOUNTER — Other Ambulatory Visit: Payer: Self-pay

## 2020-09-23 MED ORDER — PANTOPRAZOLE SODIUM 40 MG PO TBEC: 40.0000 mg | DELAYED_RELEASE_TABLET | Freq: Two times a day (BID) | ORAL | 3 refills | Status: DC

## 2020-09-23 NOTE — Telephone Encounter (Signed)
Contacted patient and patient request Protonix be sent to [facility].

## 2020-10-01 DIAGNOSIS — M545 Low back pain, unspecified: Secondary | ICD-10-CM | POA: Diagnosis not present

## 2020-10-01 DIAGNOSIS — S78112S Complete traumatic amputation at level between left hip and knee, sequela: Secondary | ICD-10-CM | POA: Diagnosis not present

## 2020-10-01 DIAGNOSIS — E119 Type 2 diabetes mellitus without complications: Secondary | ICD-10-CM | POA: Diagnosis not present

## 2020-10-05 ENCOUNTER — Ambulatory Visit: Payer: Medicare Other | Admitting: Orthopaedic Surgery

## 2020-10-05 ENCOUNTER — Encounter: Payer: Self-pay | Admitting: Orthopaedic Surgery

## 2020-10-05 DIAGNOSIS — M25561 Pain in right knee: Secondary | ICD-10-CM

## 2020-10-05 DIAGNOSIS — Z89612 Acquired absence of left leg above knee: Secondary | ICD-10-CM

## 2020-10-05 DIAGNOSIS — S78112A Complete traumatic amputation at level between left hip and knee, initial encounter: Secondary | ICD-10-CM

## 2020-10-05 DIAGNOSIS — G8929 Other chronic pain: Secondary | ICD-10-CM

## 2020-10-05 MED ORDER — LIDOCAINE HCL 1 % IJ SOLN
3.0000 mL | INTRAMUSCULAR | Status: AC | PRN
Start: 2020-10-05 — End: 2020-10-05
  Administered 2020-10-05: 3 mL

## 2020-10-05 MED ORDER — METHYLPREDNISOLONE ACETATE 40 MG/ML IJ SUSP
40.0000 mg | INTRAMUSCULAR | Status: AC | PRN
  Administered 2020-10-05: 40 mg via INTRA_ARTICULAR
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
## 2020-10-14 ENCOUNTER — Telehealth: Payer: Self-pay

## 2020-10-14 DIAGNOSIS — K2270 Barrett's esophagus without dysplasia: Secondary | ICD-10-CM

## 2020-10-14 MED ORDER — TAMSULOSIN HCL 0.4 MG PO CAPS: 0.4000 mg | ORAL_CAPSULE | Freq: Two times a day (BID) | ORAL | 0 refills | Status: DC

## 2020-10-14 NOTE — Telephone Encounter (Signed)
-----   Message from Biagio Borg, MD sent at 10/13/2020  4:50 PM EDT ----- Please refill as per office routine med refill policy (all routine meds refilled for 3 mo or monthly per pt preference up to one year from last visit, then month to month grace period for 3 mo, then further med refills will have to be denied)   ----- Message ----- From: Archie Balboa, CMA Sent: 10/13/2020   4:46 PM EDT To: Biagio Borg, MD  Dr. Jenny Reichmann,  Is this okay to refill? ----- Message ----- From: [place] D Sent: 10/13/2020   3:29 PM EDT To: Shirlyn Goltz, CMA  Refill request

## 2020-11-01 DIAGNOSIS — E119 Type 2 diabetes mellitus without complications: Secondary | ICD-10-CM | POA: Diagnosis not present

## 2020-11-01 DIAGNOSIS — S78112S Complete traumatic amputation at level between left hip and knee, sequela: Secondary | ICD-10-CM | POA: Diagnosis not present

## 2020-11-01 DIAGNOSIS — M545 Low back pain, unspecified: Secondary | ICD-10-CM | POA: Diagnosis not present

## 2020-11-03 ENCOUNTER — Ambulatory Visit: Payer: Medicare HMO | Admitting: Cardiology

## 2020-11-03 ENCOUNTER — Other Ambulatory Visit: Payer: Self-pay

## 2020-11-03 ENCOUNTER — Ambulatory Visit: Payer: Medicare Other | Admitting: Physician Assistant

## 2020-11-03 VITALS — BP 131/66 | HR 60 | Ht 71.0 in | Wt 286.0 lb

## 2020-11-03 DIAGNOSIS — I5030 Unspecified diastolic (congestive) heart failure: Secondary | ICD-10-CM

## 2020-11-03 DIAGNOSIS — I25810 Atherosclerosis of coronary artery bypass graft(s) without angina pectoris: Secondary | ICD-10-CM

## 2020-11-03 DIAGNOSIS — E785 Hyperlipidemia, unspecified: Secondary | ICD-10-CM | POA: Diagnosis not present

## 2020-11-03 DIAGNOSIS — I10 Essential (primary) hypertension: Secondary | ICD-10-CM

## 2020-11-03 DIAGNOSIS — R0683 Snoring: Secondary | ICD-10-CM

## 2020-11-03 MED ORDER — CARVEDILOL 6.25 MG PO TABS: 6.2500 mg | ORAL_TABLET | Freq: Two times a day (BID) | ORAL | 3 refills | Status: DC
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
## 2020-11-03 NOTE — Patient Instructions (Signed)
Medication Instructions:  Decrease Coreg to 6.25 mg (1 Tablet Twice Daily). *If you need a refill on your cardiac medications before your next appointment, please call your pharmacy*   Lab Work: No Labs If you have labs (blood work) drawn today and your tests are completely normal, you will receive your results only by: [place] (if you have MyChart) OR A paper copy in the mail If you have any lab test that is abnormal or we need to change your treatment, we will call you to review the results.   Testing/Procedures: [address], [address]. Your physician has requested that you have an echocardiogram. Echocardiography is a painless test that uses sound waves to create images of your heart. It provides your doctor with information about the size and shape of your heart and how well your heart's chambers and valves are working. This procedure takes approximately one hour. There are no restrictions for this procedure.   [place]: [place] [address]. Your physician has recommended that you have a sleep study. This test records several body functions during sleep, including: brain activity, eye movement, oxygen and carbon dioxide blood levels, heart rate and rhythm, breathing rate and rhythm, the flow of air through your mouth and nose, snoring, body muscle movements, and chest and belly movement.   Follow-Up: At [facility], you and your health needs are our priority.  As part of our continuing mission to provide you with exceptional heart care, we have created designated Provider Care Teams.  These Care Teams include your primary Cardiologist (physician) and Advanced Practice Providers (APPs -  Physician Assistants and Nurse Practitioners) who all work together to provide you with the care you need, when you need it.     Your next appointment:   6 month(s)  The format for your next appointment:   In Person  Provider:   [name], MD

## 2020-11-24 ENCOUNTER — Other Ambulatory Visit: Payer: Self-pay

## 2020-11-24 ENCOUNTER — Ambulatory Visit (HOSPITAL_COMMUNITY): Payer: Medicare Other | Attending: Cardiovascular Disease

## 2020-11-24 DIAGNOSIS — I25810 Atherosclerosis of coronary artery bypass graft(s) without angina pectoris: Secondary | ICD-10-CM

## 2020-11-24 DIAGNOSIS — I5030 Unspecified diastolic (congestive) heart failure: Secondary | ICD-10-CM

## 2020-11-24 DIAGNOSIS — R0683 Snoring: Secondary | ICD-10-CM | POA: Diagnosis not present

## 2020-11-24 LAB — ECHOCARDIOGRAM COMPLETE
Area-P 1/2: 2.69 cm2
S' Lateral: 3.7 cm

## 2020-11-24 MED ORDER — PERFLUTREN LIPID MICROSPHERE
1.0000 mL | INTRAVENOUS | Status: AC | PRN
  Administered 2020-11-24: 2 mL via INTRAVENOUS

## 2020-11-25 ENCOUNTER — Telehealth: Payer: Self-pay

## 2020-11-25 NOTE — Telephone Encounter (Addendum)
Spoke with patient advised echocardiogram looked good. Advised patient to monitor Blood Pressure if Systolic Pressure over [id number] to cal office.----- Message from Warren Lacy, PA-C sent at 11/25/2020  3:50 PM EDT ----- Your heart function/squeeze is normal.  You have no significant valve disease.  There is very mild dilation of the aorta, which is the big vessel that comes out of your heart.  We recommend good blood pressure control with systolic blood pressure goal of 105 to 120 mmHg.  If your blood pressure is persistently higher than 120, let us know.    Overall a good looking echocardiogram.  No pathology here to explain your fatigue.

## 2020-12-22 ENCOUNTER — Other Ambulatory Visit: Payer: Self-pay | Admitting: Internal Medicine

## 2021-01-01 ENCOUNTER — Telehealth: Payer: Self-pay | Admitting: *Deleted

## 2021-01-01 NOTE — Chronic Care Management (AMB) (Signed)
  Chronic Care Management   Outreach Note  01/01/2021 Name: BROLIN DAMBROSIA MRN: [id number] DOB: Nov 18, 1945  AQUARIUS LATOUCHE is a 75 y.o. year old male who is a primary care patient of Biagio Borg, MD. I reached out to [name] by phone today in response to a referral sent by Mr. BREYTON VANSCYOC primary care provider.  An unsuccessful telephone outreach was attempted today. The patient was referred to the case management team for assistance with care management and care coordination.   Follow Up Plan: A HIPAA compliant phone message was left for the patient providing contact information and requesting a return call.  The care management team will reach out to the patient again over the next 7 days.  If patient returns call to provider office, please advise to call [place]* at [phone].*  [place] Management  Direct Dial: [phone]

## 2021-01-08 NOTE — Chronic Care Management (AMB) (Signed)
  Chronic Care Management   Outreach Note  01/08/2021 Name: Steven Charles MRN: [id number] DOB: [birth year]  Steven Charles is a 75 y.o. year old male who is a primary care patient of Biagio Borg, MD. I reached out to Steven Charles by phone today in response to a referral sent by Steven Charles primary care provider.  A second unsuccessful telephone outreach was attempted today. The patient was referred to the case management team for assistance with care management and care coordination.   Follow Up Plan: A HIPAA compliant phone message was left for the patient providing contact information and requesting a return call.  The care management team will reach out to the patient again over the next 7 days.  If patient returns call to provider office, please advise to call [place]* at [phone].*  [place] Management  Direct Dial: [phone]

## 2021-01-15 NOTE — Chronic Care Management (AMB) (Signed)
  Chronic Care Management   Outreach Note  01/15/2021 Name: Steven Charles MRN: [id number] DOB: 1946-02-04  Steven Charles is a 75 y.o. year old male who is a primary care patient of Biagio Borg, MD. I reached out to Steven Charles by phone today in response to a referral sent by Steven Charles primary care provider.  Third unsuccessful telephone outreach was attempted today. The patient was referred to the case management team for assistance with care management and care coordination. The patient's primary care provider has been notified of our unsuccessful attempts to make or maintain contact with the patient. The care management team is pleased to engage with this patient at any time in the future should he/she be interested in assistance from the care management team.   Follow Up Plan: We have been unable to make contact with the patient. The care management team is available to follow up with the patient after provider conversation with the patient regarding recommendation for care management engagement and subsequent re-referral to the care management team.  A HIPAA compliant phone message was left for the patient providing contact information and requesting a return call.   [place] Management  Direct Dial: [phone]

## 2021-01-18 ENCOUNTER — Telehealth: Payer: Self-pay | Admitting: *Deleted

## 2021-01-18 NOTE — Chronic Care Management (AMB) (Signed)
  Chronic Care Management   Note  01/18/2021 Name: Steven Charles MRN: [id number] DOB: 1945-10-15  CAEDIN MOGAN is a 75 y.o. year old male who is a primary care patient of Biagio Borg, MD. I reached out to [name] by phone today in response to a referral sent by [name] PCP.  Mr. Delillo was given information about Chronic Care Management services today including:  CCM service includes personalized support from designated clinical staff supervised by his physician, including individualized plan of care and coordination with other care providers 24/7 contact phone numbers for assistance for urgent and routine care needs. Service will only be billed when office clinical staff spend 20 minutes or more in a month to coordinate care. Only one practitioner may furnish and bill the service in a calendar month. The patient may stop CCM services at any time (effective at the end of the month) by phone call to the office staff. The patient is responsible for co-pay (up to 20% after annual deductible is met) if co-pay is required by the individual health plan.   Patient agreed to services and verbal consent obtained.   Follow up plan: Telephone appointment with care management team member scheduled for: 01/28/21  [place] Management  Direct Dial: [phone]

## 2021-01-27 ENCOUNTER — Other Ambulatory Visit: Payer: Self-pay | Admitting: Internal Medicine

## 2021-01-27 NOTE — Telephone Encounter (Signed)
Please refill as per office routine med refill policy (all routine meds to be refilled for 3 mo or monthly (per pt preference) up to one year from last visit, then month to month grace period for 3 mo, then further med refills will have to be denied) ? ?

## 2021-01-28 ENCOUNTER — Ambulatory Visit (INDEPENDENT_AMBULATORY_CARE_PROVIDER_SITE_OTHER): Payer: Medicare Other | Admitting: *Deleted

## 2021-01-28 DIAGNOSIS — I10 Essential (primary) hypertension: Secondary | ICD-10-CM

## 2021-01-28 DIAGNOSIS — R296 Repeated falls: Secondary | ICD-10-CM

## 2021-01-28 DIAGNOSIS — E11649 Type 2 diabetes mellitus with hypoglycemia without coma: Secondary | ICD-10-CM

## 2021-01-28 NOTE — Patient Instructions (Addendum)
Visit [place], it was nice talking with you today.    I look forward to talking to you again for a telephone update on Wednesday, February 10, 2021 at 2:00 pm- please be listening out for my call that day.  I will call as close to 2:00 pm as possible.   If you need to cancel or re-schedule our telephone visit, please call [phone] and one of our care guides will be happy to assist you.   I look forward to hearing about your progress.   Please don't hesitate to contact me if I can be of assistance to you before our next scheduled telephone appointment.   Oneta Rack, RN, BSN, [facility] RN Care Coordination- [place] [phone]: direct office [phone]: mobile   [name] will: Self administer medications as prescribed Attend all scheduled provider appointments Call pharmacy for medication refills Call provider office for new concerns or questions Continue to monitor and write down on paper fasting (first thing in the morning, before eating) blood sugars several times each week Continue to check and write down on paper blood pressures at home several times each week  Diabetes Mellitus and Standards of [place] with and managing diabetes (diabetes mellitus) can be complicated. Your diabetes treatment may be managed by a team of health care providers, including: A physician who specializes in diabetes (endocrinologist). You might also have visits with a nurse practitioner or physician assistant. Nurses. A registered dietitian. A certified diabetes care and education specialist. An exercise specialist. A pharmacist. An eye doctor. A foot specialist (podiatrist). A dental care provider. A primary care provider. A mental health care provider. How to manage your diabetes You can do many things to successfully manage your diabetes. Your health care providers will follow guidelines to  help you get the best quality of care. Here are general guidelines for your diabetes management plan. Your health care providers may give you more specific instructions. Physical exams When you are diagnosed with diabetes, and each year after that, your health care provider will ask about your medical and family history. You will have a physical exam, which may include: Measuring your height, weight, and body mass index (BMI). Checking your blood pressure. This will be done at every routine medical visit. Your target blood pressure may vary depending on your medical conditions, your age, and other factors. A thyroid exam. A skin exam. Screening for nerve damage (peripheral neuropathy). This may include checking the pulse in your legs and feet and the level of sensation in your hands and feet. A foot exam to inspect the structure and skin of your feet, including checking for cuts, bruises, redness, blisters, sores, or other problems. Screening for blood vessel (vascular) problems. This may include checking the pulse in your legs and feet and checking your temperature. Blood tests Depending on your treatment plan and your personal needs, you may have the following tests: Hemoglobin A1C (HbA1C). This test provides information about blood sugar (glucose) control over the previous 2-3 months. It is used to adjust your treatment plan, if needed. This test will be done: At least 2 times a year, if you are meeting your treatment goals. 4 times a year, if you are not meeting your treatment goals or if your goals have changed. Lipid testing, including total cholesterol, LDL and HDL cholesterol, and triglyceride levels. The goal for LDL is less than 100 mg/dL (5.5 mmol/L). If you are  at high risk for complications, the goal is less than 70 mg/dL (3.9 mmol/L). The goal for HDL is 40 mg/dL (2.2 mmol/L) or higher for men, and 50 mg/dL (2.8 mmol/L) or higher for women. An HDL cholesterol of 60 mg/dL (3.3 mmol/L)  or higher gives some protection against heart disease. The goal for triglycerides is less than 150 mg/dL (8.3 mmol/L). Liver function tests. Kidney function tests. Thyroid function tests.  Dental and eye exams  Visit your dentist two times a year. If you have type 1 diabetes, your health care provider may recommend an eye exam within 5 years after you are diagnosed, and then once a year after your first exam. For children with type 1 diabetes, the health care provider may recommend an eye exam when your child is age 12 or older and has had diabetes for 3-5 years. After the first exam, your child should get an eye exam once a year. If you have type 2 diabetes, your health care provider may recommend an eye exam as soon as you are diagnosed, and then every 1-2 years after your first exam. Immunizations A yearly flu (influenza) vaccine is recommended annually for everyone 6 months or older. This is especially important if you have diabetes. The pneumonia (pneumococcal) vaccine is recommended for everyone 2 years or older who has diabetes. If you are age 13 or older, you may get the pneumonia vaccine as a series of two separate shots. The hepatitis B vaccine is recommended for adults shortly after being diagnosed with diabetes. Adults and children with diabetes should receive all other vaccines according to age-specific recommendations from the Centers for Disease Control and Prevention (CDC). Mental and emotional health Screening for symptoms of eating disorders, anxiety, and depression is recommended at the time of diagnosis and after as needed. If your screening shows that you have symptoms, you may need more evaluation. You may work with a mental health care provider. Follow these instructions at home: Treatment plan You will monitor your blood glucose levels and may give yourself insulin. Your treatment plan will be reviewed at every medical visit. You and your health care provider will  discuss: How you are taking your medicines, including insulin. Any side effects you have. Your blood glucose level target goals. How often you monitor your blood glucose level. Lifestyle habits, such as activity level and tobacco, alcohol, and substance use. Education Your health care provider will assess how well you are monitoring your blood glucose levels and whether you are taking your insulin and medicines correctly. He or she may refer you to: A certified diabetes care and education specialist to manage your diabetes throughout your life, starting at diagnosis. A registered dietitian who can create and review your personal nutrition plan. An exercise specialist who can discuss your activity level and exercise plan. General instructions Take over-the-counter and prescription medicines only as told by your health care provider. Keep all follow-up visits. This is important. Where to find support There are many diabetes support networks, including: American Diabetes Association (ADA): diabetes.org Defeat Diabetes Foundation: defeatdiabetes.org Where to find more information American Diabetes Association (ADA): www.diabetes.org Association of Diabetes Care & Education Specialists (ADCES): diabeteseducator.org International Diabetes Federation (IDF): https://www.munoz-bell.org/ Summary Managing diabetes (diabetes mellitus) can be complicated. Your diabetes treatment may be managed by a team of health care providers. Your health care providers follow guidelines to help you get the best quality care. You should have physical exams, blood tests, blood pressure monitoring, immunizations, and screening tests regularly. Stay  updated on how to manage your diabetes. Your health care providers may also give you more specific instructions based on your individual health. This information is not intended to replace advice given to you by your health care provider. Make sure you discuss any questions you have with your  health care provider. Document Revised: 09/19/2019 Document Reviewed: 09/19/2019 Elsevier Patient Education  [place].   PATIENT GOALS/PLAN OF CARE:  Care Plan : RN Care Manager Plan of Care  Updates made by Steven Royalty, RN since 01/28/2021 12:00 AM     Problem: Chronic Disease Management Needs   Priority: High     Long-Range Goal: Development of Plan of Care for long term chronic disease management   Start Date: 01/28/2021  Expected End Date: 01/28/2022  This Visit's Progress: On track  Priority: High  Note:   Current Barriers:  Chronic Disease Management support and education needs related to HTN and DMII (L) BKA- patient wheelchair bound; has prosthesis x 2 but does not use: history of falls SDOH Barriers: food acquisition, home accessibility for handicapped needs; financial resources: 01/28/21- patient reports he has applied for assistance and been denies; declines [employer] referral today- asks that I speak to his wife about this option, and she is not present in home today- appointment made to follow up by telephone on 02/10/21 Adult step son lives in home with patient/ spouse- son requires spouse's care giving due to previous brain injury Memory issues: patient reports memory issues, spouse handles all of patient's and family's care giving and daily survival needs  RNCM Clinical Goal(s):  Patient will demonstrate Improved health management independence DMII; HTN  through collaboration with RN Care manager, provider, and care team.   Interventions: 1:1 collaboration with primary care provider regarding development and update of comprehensive plan of care as evidenced by provider attestation and co-signature Inter-disciplinary care team collaboration (see longitudinal plan of care) Evaluation of current treatment plan related to  self management and patient's adherence to plan as established by provider  Hypertension Interventions: Last practice  recorded BP readings:  BP Readings from Last 3 Encounters:  11/24/20 139/84  11/03/20 131/66  08/06/20 (!) 148/70  Most recent eGFR/CrCl: No results found for: EGFR  No components found for: CRCL  Evaluation of current treatment plan related to hypertension self management and patient's adherence to plan as established by provider; Discussed plans with patient for ongoing care management follow up and provided patient with direct contact information for care management team; Discussed complications of poorly controlled blood pressure such as heart disease, stroke, circulatory complications, vision complications, kidney impairment, sexual dysfunction;  Assessed social determinant of health barriers;  Reviewed recent cardiology provider office visit on 11/03/20 Discussed medications: declines medication review today, not near medications; states he independently manages medications, uses weekly pill planner: may be interested in blister packaging in future- wishes for me to discuss this option with his spouse once she is available Confirmed patient monitors blood pressures at home weekly; denies issues/ concerns around blood pressures; is not near his recorded values during our conversation: encouraged patient to continnue monitoring and writing down on paper his blood pressures for our future review together  Diabetes Interventions: Assessed patient's understanding of A1c goal: <7% Provided education to patient about basic DM disease process; Counseled on importance of regular laboratory monitoring as prescribed; Reviewed scheduled/upcoming provider appointments including: 02/08/21- PCP; Review of patient status, including review of consultants reports, relevant laboratory and other test results, and  medications completed; Confirmed patient monitors fasting blood sugars at home several times weekly; reports last remembered blood sugar "last night it was 142" Initiated A1-C education; review of  personal historical A1-C values  Lab Results  Component Value Date   HGBA1C 5.7 08/06/2020  Patient Goals/Self-Care Activities: As evidenced by review of EHR and patient reporting during CCM RN CM outreach, Patient Steven Charles will: Self administer medications as prescribed Attend all scheduled provider appointments Call pharmacy for medication refills Call provider office for new concerns or questions Continue to monitor and write down on paper fasting (first thing in the morning, before eating) blood sugars several times each week Continue to check and write down on paper blood pressures at home several times each week  Follow Up Plan:   Telephone follow up appointment with care management team member scheduled for:  Wednesday, February 10, 2021 at 2:00 pm The patient has been provided with contact information for the care management team and has been advised to call with any health related questions or concerns     Consent to CCM Services: Steven Charles was given information about Chronic Care Management services including:  CCM service includes personalized support from designated clinical staff supervised by his physician, including individualized plan of care and coordination with other care providers 24/7 contact phone numbers for assistance for urgent and routine care needs. Service will only be billed when office clinical staff spend 20 minutes or more in a month to coordinate care. Only one practitioner may furnish and bill the service in a calendar month. The patient may stop CCM services at any time (effective at the end of the month) by phone call to the office staff. The patient will be responsible for cost sharing (co-pay) of up to 20% of the service fee (after annual deductible is met).  Patient agreed to services and verbal consent obtained.   The patient verbalized understanding of instructions, educational materials, and care plan provided today and agreed to receive a mailed  copy of patient instructions, educational materials, and care plan Telephone follow up appointment with care management team member scheduled for:  Wednesday, February 10, 2021 at 2:00 pm The patient has been provided with contact information for the care management team and has been advised to call with any health related questions or concerns  Oneta Rack, RN, BSN, [place] [phone]: direct office [phone]: mobile
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
## 2021-01-29 ENCOUNTER — Other Ambulatory Visit: Payer: Self-pay | Admitting: Internal Medicine

## 2021-02-08 ENCOUNTER — Ambulatory Visit (INDEPENDENT_AMBULATORY_CARE_PROVIDER_SITE_OTHER): Payer: Medicare Other | Admitting: Internal Medicine

## 2021-02-08 ENCOUNTER — Encounter: Payer: Self-pay | Admitting: Internal Medicine

## 2021-02-08 ENCOUNTER — Other Ambulatory Visit: Payer: Self-pay

## 2021-02-08 VITALS — BP 126/78 | HR 63 | Temp 98.6°F | Ht 71.0 in | Wt 277.0 lb

## 2021-02-08 DIAGNOSIS — I10 Essential (primary) hypertension: Secondary | ICD-10-CM | POA: Diagnosis not present

## 2021-02-08 DIAGNOSIS — E7800 Pure hypercholesterolemia, unspecified: Secondary | ICD-10-CM | POA: Diagnosis not present

## 2021-02-08 DIAGNOSIS — E1165 Type 2 diabetes mellitus with hyperglycemia: Secondary | ICD-10-CM

## 2021-02-08 DIAGNOSIS — L989 Disorder of the skin and subcutaneous tissue, unspecified: Secondary | ICD-10-CM | POA: Diagnosis not present

## 2021-02-08 DIAGNOSIS — Z23 Encounter for immunization: Secondary | ICD-10-CM

## 2021-02-08 NOTE — Patient Instructions (Signed)
Your A1c was done today  Please be sure to see Dr Delman Cheadle for the 2 black spots on the back; or call for a referral if needed  Please continue all other medications as before, and refills have been done if requested.  Please have the pharmacy call with any other refills you may need.  Please continue your efforts at being more active, low cholesterol diet, and weight control.  Please keep your appointments with your specialists as you may have planned  Please make an Appointment to return in 6 months, or sooner if needed
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
## 2021-02-10 ENCOUNTER — Telehealth: Payer: Self-pay | Admitting: *Deleted

## 2021-02-10 ENCOUNTER — Telehealth: Payer: Medicare Other

## 2021-02-10 ENCOUNTER — Encounter: Payer: Self-pay | Admitting: *Deleted

## 2021-02-10 NOTE — Telephone Encounter (Signed)
  Chronic Care Management   Follow Up Note   02/10/2021 Name: Steven Charles MRN: [id number] DOB: Dec 09, 1945  Referred by: Biagio Borg, MD Reason for referral : Chronic Care Management (CCM RN CM Follow UP Telephone Visit; HTN; DMII- Unsuccessful outreach)  An unsuccessful telephone outreach was attempted today. The patient was referred to the case management team for assistance with care management and care coordination.   Follow Up Plan:  A HIPPA compliant phone message was left for the patient providing contact information and requesting a return call Will place request with scheduling care guide to contact patient to re-schedule today's missed CCM RN follow up telephone appointment if I do not hear back from patient by end of day  Oneta Rack, RN, BSN, [place] [phone]: direct office [phone]: mobile

## 2021-02-11 ENCOUNTER — Telehealth: Payer: Self-pay | Admitting: *Deleted

## 2021-02-11 NOTE — Chronic Care Management (AMB) (Signed)
  Care Management   Note  02/11/2021 Name: FARMER MCCAHILL MRN: [id number] DOB: 09/10/1945  LINKEN MCGLOTHEN is a 75 y.o. year old male who is a primary care patient of Biagio Borg, MD and is actively engaged with the care management team. I reached out to [name] by phone today to assist with re-scheduling a follow up visit with the RN Case Manager  Follow up plan: Unsuccessful telephone outreach attempt made. A HIPAA compliant phone message was left for the patient providing contact information and requesting a return call.  The care management team will reach out to the patient again over the next 7 days.  If patient returns call to provider office, please advise to call [facility] at [phone].  [facility]  Direct Dial: [phone]

## 2021-02-14 ENCOUNTER — Encounter: Payer: Self-pay | Admitting: Internal Medicine

## 2021-02-14 DIAGNOSIS — L989 Disorder of the skin and subcutaneous tissue, unspecified: Secondary | ICD-10-CM | POA: Insufficient documentation

## 2021-02-14 NOTE — Assessment & Plan Note (Signed)
Lab Results  Component Value Date   HGBA1C 5.7 08/06/2020   Stable, pt to continue current medical treatment  - diet, wt control

## 2021-02-14 NOTE — Assessment & Plan Note (Signed)
?   Clinical significance, but cant r/o early melanoma, wife states will make appt with dermatology

## 2021-02-14 NOTE — Assessment & Plan Note (Signed)
BP Readings from Last 3 Encounters:  02/08/21 126/78  11/24/20 139/84  11/03/20 131/66   Stable, pt to continue medical treatment coreg

## 2021-02-14 NOTE — Assessment & Plan Note (Signed)
Lab Results  Component Value Date   LDLCALC 55 08/06/2020   Stable, pt to continue current statin lipitor 80

## 2021-02-15 ENCOUNTER — Telehealth: Payer: Self-pay | Admitting: Internal Medicine

## 2021-02-15 NOTE — Telephone Encounter (Signed)
Left message for patient to call me back at [phone] to schedule Medicare Annual Wellness Visit   Last AWV  11/13/19   Please schedule at anytime with [facility] Fajardo if patient calls the office back.    40 Minutes appointment   Any questions, please call me at [phone]

## 2021-02-17 NOTE — Chronic Care Management (AMB) (Signed)
  Care Management   Note  02/17/2021 Name: Steven Charles MRN: [id number] DOB: January 16, 1946  Steven Charles is a 75 y.o. year old male who is a primary care patient of Biagio Borg, MD and is actively engaged with the care management team. I reached out to [name] by phone today to assist with re-scheduling a follow up visit with the RN Case Manager  Follow up plan: Telephone appointment with care management team member scheduled for:02/25/21  [facility]  Direct Dial: [phone]

## 2021-02-24 DIAGNOSIS — E11649 Type 2 diabetes mellitus with hypoglycemia without coma: Secondary | ICD-10-CM | POA: Diagnosis not present

## 2021-02-24 DIAGNOSIS — I10 Essential (primary) hypertension: Secondary | ICD-10-CM | POA: Diagnosis not present

## 2021-02-25 ENCOUNTER — Ambulatory Visit (INDEPENDENT_AMBULATORY_CARE_PROVIDER_SITE_OTHER): Payer: Medicare Other | Admitting: *Deleted

## 2021-02-25 DIAGNOSIS — E1165 Type 2 diabetes mellitus with hyperglycemia: Secondary | ICD-10-CM

## 2021-02-25 DIAGNOSIS — I10 Essential (primary) hypertension: Secondary | ICD-10-CM

## 2021-02-25 NOTE — Patient Instructions (Signed)
Visit Information  Marquiz and Iva Boop you for taking time to talk with me today. Please don't hesitate to contact me if I can be of assistance to you before our next scheduled telephone appointment.  Below are the goals we discussed today:   [name] will: Take medications as prescribed Attend all scheduled provider appointments Call pharmacy for medication refills Call provider office for new concerns or questions Check and write down on paper fasting (first thing in the morning, before eating) blood sugars several times each week; alternate and occasionally check your sugar 2 hours after a meal You only need to check your blood sugar once a day Continue to check and write down on paper blood pressures at home several times each week Talk to the Pharmacy team at Dr. Gwynn Burly office: I have asked them to call you about your concerns with taking so many medications- please listen out for their phone call Talk to the [place] about resources that may be available- please listen out for their phone call Affording food Paying utility bills Making your home better equipped for handicap needs: you may want to proactively look into the "Aging Gracefully Program" through [employer] resources that may be available for you at reduced cost  Our next appointment is by telephone on Tuesday, March 30, 2021 at 2:30 pm  Please call the care guide team at [phone] if you need to cancel or reschedule your appointment.   The patient verbalized understanding of instructions, educational materials, and care plan provided today and agreed to receive a mailed copy of patient instructions, educational materials, and care plan.   Oneta Rack, RN, BSN, [facility] RN Care Coordination- [place] [phone]: direct office  Blood Glucose Monitoring, Adult Monitoring your blood sugar (glucose)  is an important part of managing your diabetes. Blood glucose monitoring involves checking your blood glucose as often as directed and keeping a log or record of your results over time. Checking your blood glucose regularly and keeping a blood glucose log can: Help you and your health care provider adjust your diabetes management plan as needed, including your medicines or insulin. Help you understand how food, exercise, illnesses, and medicines affect your blood glucose. Let you know what your blood glucose is at any time. You can quickly find out if you have low blood glucose (hypoglycemia) or high blood glucose (hyperglycemia). Your health care provider will set individualized treatment goals for you. Your goals will be based on your age, other medical conditions you have, and how you respond to diabetes treatment. Generally, the goal of treatment is to maintain the following blood glucose levels: Before meals (preprandial): 80-130 mg/dL (4.4-7.2 mmol/L). 2 hours After meals (postprandial): below 180 mg/dL (10 mmol/L). A1C level: less than 7%. Supplies needed: Blood glucose meter. Test strips for your meter. Each meter has its own strips. You must use the strips that came with your meter. A needle to prick your finger (lancet). Do not use a lancet more than one time. A device that holds the lancet (lancing device). A journal or log book to write down your results. How to check your blood glucose Checking your blood glucose  Wash your hands for at least 20 seconds with soap and water. Prick the side of your finger (not the tip) with the lancet. Do not use the same finger consecutively. Gently rub the finger until a small drop of blood appears. Follow instructions  that come with your meter for inserting the test strip, applying blood to the strip, and using your blood glucose meter. Write down your result and any notes in your log. Using alternative sites Some meters allow you to use areas  of your body other than your finger (alternative sites) to test your blood. The most common alternative sites are the forearm, the thigh, and the palm of your hand. Alternative sites may not be as accurate as the fingers because blood flow is slower in those areas. This means that the result you get may be delayed, and it may be different from the result that you would get from your finger. Use the finger only, and do not use alternative sites, if: You think you have hypoglycemia. You sometimes do not know that your blood glucose is getting low (hypoglycemia unawareness). General tips and recommendations Blood glucose log  Every time you check your blood glucose, write down your result. Also write down any notes about things that may be affecting your blood glucose, such as your diet and exercise for the day. This information can help you and your health care provider: Look for patterns in your blood glucose over time. Adjust your diabetes management plan as needed. Check if your meter allows you to download your records to a computer or if there is an app for the meter. Most glucose meters store a record of glucose readings in the meter. If you have type 1 diabetes: Check your blood glucose 4 or more times a day if you are on intensive insulin therapy with multiple daily injections (MDI) or if you are using an insulin pump. Check your blood glucose: Before every meal and snack. Before bedtime. Also check your blood glucose: If you have symptoms of hypoglycemia. After treating low blood glucose. Before doing activities that create a risk for injury, like driving or using machinery. Before and after exercise. Two hours after a meal. Occasionally between 2:00 a.m. and 3:00 a.m., as directed. You may need to check your blood glucose more often, 6-10 times per day, if: You have diabetes that is not well controlled. You are ill. You have a history of severe hypoglycemia. You have hypoglycemia  unawareness. If you have type 2 diabetes: Check your blood glucose 2 or more times a day if you take insulin or other diabetes medicines. Check your blood glucose 4 or more times a day if you are on intensive insulin therapy. Occasionally, you may also need to check your glucose between 2:00 a.m. and 3:00 a.m., as directed. Also check your blood glucose: Before and after exercise. Before doing activities that create a risk for injury, like driving or using machinery. You may need to check your blood glucose more often if: Your medicine is being adjusted. Your diabetes is not well controlled. You are ill. General tips Make sure you always have your supplies with you. After you use a few boxes of test strips, adjust (calibrate) your blood glucose meter by following instructions that came with your meter. If you have questions or need help, all blood glucose meters have a 24-hour hotline phone number available that you can call. Also contact your health care provider with questions or concerns you may have. Where to find more information The American Diabetes Association: www.diabetes.org The Association of Diabetes Care & Education Specialists: www.diabeteseducator.org Contact a health care provider if: Your blood glucose is at or above 240 mg/dL (13.3 mmol/L) for 2 days in a row. You have been sick  or have had a fever for 2 days or longer, and you are not getting better. You have any of the following problems for more than 6 hours: You cannot eat or drink. You have nausea or vomiting. You have diarrhea. Get help right away if: Your blood glucose is lower than 54 mg/dL (3 mmol/L). You become confused, or you have trouble thinking clearly. You have difficulty breathing. You have moderate or large ketone levels in your urine. These symptoms may represent a serious problem that is an emergency. Do not wait to see if the symptoms will go away. Get medical help right away. Call your local  emergency services (911 in the [place] Do not drive yourself to the hospital. Summary Monitoring your blood glucose is an important part of managing your diabetes. Blood glucose monitoring involves checking your blood glucose as often as directed and keeping a log or record of your results over time. Your health care provider will set individualized treatment goals for you. Your goals will be based on your age, other medical conditions you have, and how you respond to diabetes treatment. Every time you check your blood glucose, write down your result. Also, write down any notes about things that may be affecting your blood glucose, such as your diet and exercise for the day. This information is not intended to replace advice given to you by your health care provider. Make sure you discuss any questions you have with your health care provider. Document Revised: 12/11/2019 Document Reviewed: 12/11/2019 Elsevier Patient Education  2022 [employer].
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
## 2021-03-03 ENCOUNTER — Telehealth: Payer: Self-pay

## 2021-03-03 NOTE — Telephone Encounter (Signed)
Letter has been sent to patient instructing them to call us if they are still interested in completing their sleep study. If we have not received a response from the patient within 30 days of this notice, the order will be cancelled and they will need to discuss the need for a sleep study at their next office visit.  ° °

## 2021-03-10 ENCOUNTER — Other Ambulatory Visit: Payer: Self-pay | Admitting: Internal Medicine

## 2021-03-10 NOTE — Telephone Encounter (Signed)
Ok refill for levothyroxine only   Please refill as per office routine med refill policy (all routine meds to be refilled for 3 mo or monthly (per pt preference) up to one year from last visit, then month to month grace period for 3 mo, then further med refills will have to be denied)

## 2021-03-16 ENCOUNTER — Telehealth: Payer: Self-pay

## 2021-03-16 NOTE — Telephone Encounter (Signed)
Called and left message for patient to return call to office to schedule for initial CCM pharmacy appointment

## 2021-03-16 NOTE — Telephone Encounter (Signed)
-----   Message from Knox Royalty, RN sent at 02/25/2021  5:08 PM EST ----- Teryl Lucy and Linna Hoff: Please have your scheduler contact patient to schedule a telephone visit with one of you; BOTH spouse and patient should be present for visit; spouse handles patient's medication management and she has concerns around polypharmacy; as an FYI-- she is not interested in blister packaging.  Additionally, she is the caregiver for not only patient but her adult son who has had TBI in the past-- she sounds somewhat overwhelmed. Thanks,  Oneta Rack, RN, BSN, [facility] RN Care Coordination- [place] [phone]: direct office [phone]: mobile

## 2021-03-17 ENCOUNTER — Telehealth: Payer: Self-pay | Admitting: *Deleted

## 2021-03-17 NOTE — Telephone Encounter (Signed)
° °  Telephone encounter was:  Unsuccessful.  03/17/2021 Name: JASAN DOUGHTIE MRN: [id number] DOB: 1946-03-14  Unsuccessful outbound call made today to assist with:  Food Insecurity  Outreach Attempt:  1st Attempt   Voicemail full [place], Care Management  [phone] [address] , [place] [address] Email : [email or web address]

## 2021-03-23 ENCOUNTER — Telehealth: Payer: Self-pay | Admitting: *Deleted

## 2021-03-25 ENCOUNTER — Telehealth: Payer: Self-pay | Admitting: *Deleted

## 2021-03-25 NOTE — Telephone Encounter (Signed)
° °  Telephone encounter was:  Unsuccessful.  03/25/2021 Name: Steven Charles MRN: [id number] DOB: 16-Jun-1945  Unsuccessful outbound call made today to assist with:  Food Insecurity  Outreach Attempt:  3rd Attempt.  Referral closed unable to contact patient.  A HIPAA compliant voice message was left requesting a return call.  Instructed patient to call back at   Instructed patient to call back at [phone]  at their earliest convenience. .  [name], Care Management  [phone] [address] , [place] [address] Email : [email or web address]

## 2021-03-27 DIAGNOSIS — I10 Essential (primary) hypertension: Secondary | ICD-10-CM

## 2021-03-27 DIAGNOSIS — E1165 Type 2 diabetes mellitus with hyperglycemia: Secondary | ICD-10-CM

## 2021-03-30 ENCOUNTER — Ambulatory Visit (INDEPENDENT_AMBULATORY_CARE_PROVIDER_SITE_OTHER): Payer: Medicare Other | Admitting: *Deleted

## 2021-03-30 DIAGNOSIS — R296 Repeated falls: Secondary | ICD-10-CM

## 2021-03-30 DIAGNOSIS — I10 Essential (primary) hypertension: Secondary | ICD-10-CM

## 2021-03-30 DIAGNOSIS — E1165 Type 2 diabetes mellitus with hyperglycemia: Secondary | ICD-10-CM

## 2021-03-30 NOTE — Patient Instructions (Signed)
Visit [place], thank you for taking time to talk with me today. Please don't hesitate to contact me if I can be of assistance to you before our next scheduled telephone appointment.  Below are the goals we discussed today:  [name] will: Take medications as prescribed Attend all scheduled provider appointments Call pharmacy for medication refills Call provider office for new concerns or questions Check and write down on paper fasting (first thing in the morning, before eating) blood sugars several times each week; alternate and occasionally check your sugar 2 hours after a meal You only need to check your blood sugar once a day Continue to check and write down on paper blood pressures at home several times each week Talk to the Pharmacy team at Dr. Gwynn Burly office: I have asked them to call you about your concerns with taking so many medications- they have tried to contact you and have left voice messages for you; I have asked the pharmacy team to re-attempt call to you to discuss your medications, as you requested on January 28, 2021-- please listen out for their phone call Talk to the [place] about resources that may be available- [place] has tried to call you several times and left messages for you to return her call: her number is: [phone]; resources you requested were for: Affording food Paying utility bills Making your home better equipped for handicap needs: you may want to proactively look into the "Aging Gracefully Program" through [employer] resources that may be available for you at reduced cost  Our next scheduled telephone follow up visit/ appointment with care management team member is scheduled on:  Monday, Aug 16, 2021 at 2:15 pm  If you need to cancel or re-schedule our visit, please call [phone] and our care guide team will be happy to assist you.   I look forward  to hearing about your progress.   Oneta Rack, RN, BSN, [place] [phone]: direct office  If you are experiencing a Mental Health or [place] or need someone to talk to, please  call the Suicide and Crisis Lifeline: 988 call the [place] National Suicide Prevention Lifeline: 803-688-0368 or TTY: 651-508-4573 TTY 5484299027) to talk to a trained counselor call 1-800-273-TALK (toll free, 24 hour hotline) go to [facility] [address] [place] [id number]) call 911   The patient verbalized understanding of instructions, educational materials, and care plan provided today and agreed to receive a mailed copy of patient instructions, educational materials, and care plan  Understanding Your Risk for Falls Each year, millions of people have serious injuries from falls. It is important to understand your risk for falling. Talk with your health care provider about your risk and what you can do to lower it. There are actions you can take at home to lower your risk and prevent falls. If you do have a serious fall, make sure to tell your health care provider. Falling once raises your risk of falling again. How can falls affect me? Serious injuries from falls are common. These include: Broken bones, such as hip fractures. Head injuries, such as traumatic brain injuries (TBI) or concussion. A fear of falling can cause you to avoid activities and stay at home. This can make your muscles weaker and actually raise your risk for a fall. What can increase my risk? There are a number  of risk factors that increase your risk for falling. The more risk factors you have, the higher your risk of falling. Serious injuries from a fall happen most often to people older than age 59. Children and young adults ages 4-29 are also at higher risk. Common risk factors include: Weakness in the  lower body. Lack (deficiency) of vitamin D. Being generally weak or confused due to long-term (chronic) illness. Dizziness or balance problems. Poor vision. Medicines that cause dizziness or drowsiness. These can include medicines for your blood pressure, heart, anxiety, insomnia, or edema, as well as pain medicines and muscle relaxants. Other risk factors include: Drinking alcohol. Having had a fall in the past. Having depression. Having foot pain or wearing improper footwear. Working at a dangerous job. Having any of the following in your home: Tripping hazards, such as floor clutter or loose rugs. Poor lighting. Pets. Having dementia or memory loss. What actions can I take to lower my risk of falling?   Physical activity Maintain physical fitness. Do strength and balance exercises. Consider taking a regular class to build strength and balance. Yoga and tai chi are good options. Vision Have your eyes checked every year and your vision prescription updated as needed. Walking aids and footwear Wear nonskid shoes. Do not wear high heels. Do not walk around the house in socks or slippers. Use a cane or walker as told by your health care provider. Home safety Attach secure railings on both sides of your stairs. Install grab bars for your tub, shower, and toilet. Use a bath mat in your tub or shower. Use good lighting in all rooms. Keep a flashlight near your bed. Make sure there is a clear path from your bed to the bathroom. Use night-lights. Do not use throw rugs. Make sure all carpeting is taped or tacked down securely. Remove all clutter from walkways and stairways, including extension cords. Repair uneven or broken steps. Avoid walking on icy or slippery surfaces. Walk on the grass instead of on icy or slick sidewalks. Use ice melt to get rid of ice on walkways. Use a cordless phone. Questions to ask your health care provider Can you help me check my risk for a fall? Do any of  my medicines make me more likely to fall? Should I take a vitamin D supplement? What exercises can I do to improve my strength and balance? Should I make an appointment to have my vision checked? Do I need a bone density test to check for weak bones or osteoporosis? Would it help to use a cane or a walker? Where to find more information Centers for Disease Control and Prevention, [employer]: http://www.wolf.info/ Community-Based Fall Prevention Programs: http://www.wolf.info/ National Institute on Aging: http://kim-miller.com/ Contact a health care provider if: You fall at home. You are afraid of falling at home. You feel weak, drowsy, or dizzy. Summary Serious injuries from a fall happen most often to people older than age 45. Children and young adults ages 35-29 are also at higher risk. Talk with your health care provider about your risks for falling and how to lower those risks. Taking certain precautions at home can lower your risk for falling. If you fall, always tell your health care provider. This information is not intended to replace advice given to you by your health care provider. Make sure you discuss any questions you have with your health care provider. Document Revised: 10/16/2019 Document Reviewed: 10/16/2019 Elsevier Patient Education  [place].
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
## 2021-04-06 ENCOUNTER — Telehealth: Payer: Self-pay

## 2021-04-06 NOTE — Progress Notes (Signed)
Chronic Care Management Pharmacy Assistant   Name: Steven Charles  MRN: [id number] DOB: 1946-02-17  Steven Charles is an 76 y.o. year old male who presents for his initial CCM visit with the clinical pharmacist.   Recent office visits:  02/08/21 Biagio Borg, MD-PCP (Skin lesions) No orders or med changes  Recent consult visits:  11/03/20 Warren Lacy, PA-C-Cardiology (Coronary artery disease) Echo ordered, Med changes: decreased coreg to 6.25 bid  10/05/20 Mcarthur Rossetti, MD-Orthopedic Surgery (Knee amputation) No orders or med changes  Hospital visits:  None in previous 6 months  Medications: Outpatient Encounter Medications as of 04/06/2021  Medication Sig   Ascorbic Acid (VITAMIN C) 500 MG tablet Take 500 mg by mouth daily.   aspirin 81 MG tablet Take 1 tablet (81 mg total) by mouth daily.   atorvastatin (LIPITOR) 80 MG tablet TAKE 1 TABLET(80 MG) BY MOUTH DAILY   blood glucose meter kit and supplies KIT Dispense based on patient and insurance preference. Use up to four times daily as directed. (E11.9)   Blood Glucose Monitoring Suppl (ONE TOUCH ULTRA 2) w/Device KIT Use the blood sugar meter to monitor your blood sugar 1-4 times per day as instructed.   buPROPion (WELLBUTRIN XL) 300 MG 24 hr tablet TAKE 1 TABLET(300 MG) BY MOUTH DAILY   Calcium Carb-Cholecalciferol 500-400 MG-UNIT TABS Take 1 tablet by mouth at bedtime.    carvedilol (COREG) 6.25 MG tablet Take 1 tablet (6.25 mg total) by mouth 2 (two) times daily.   clotrimazole-betamethasone (LOTRISONE) cream Apply 1 application topically daily as needed (for rash).   docusate sodium (COLACE) 100 MG capsule Take 100 mg by mouth at bedtime.   ezetimibe (ZETIA) 10 MG tablet Take 1 tablet (10 mg total) by mouth daily.   fexofenadine (ALLEGRA) 180 MG tablet Take 180 mg by mouth at bedtime.   FLUoxetine (PROZAC) 40 MG capsule Take 1 capsule (40 mg total) by mouth daily.   furosemide (LASIX) 20 MG tablet TAKE 1  TABLET(20 MG) BY MOUTH TWICE DAILY AS NEEDED FOR FLUID RETENTION   gabapentin (NEURONTIN) 300 MG capsule TAKE 1 CAPSULE BY MOUTH FOUR TIMES DAILY   glucose blood (ONE TOUCH ULTRA TEST) test strip Use to chec blood sugars four times a day Dx E11.65   ibuprofen (ADVIL,MOTRIN) 400 MG tablet Take 400 mg by mouth every 6 (six) hours as needed for headache or moderate pain.   Lancets (ONETOUCH ULTRASOFT) lancets Use to help check blood sugar four times a day Dx E11.65   levothyroxine (SYNTHROID) 75 MCG tablet TAKE 1 TABLET(75 MCG) BY MOUTH DAILY   lidocaine (LIDODERM) 5 % Place 1 patch onto the skin daily. Remove & Discard patch within 12 hours or as directed by MD   lisinopril (ZESTRIL) 20 MG tablet Take 1 tablet (20 mg total) by mouth daily.   LORazepam (ATIVAN) 1 MG tablet TAKE 1 TABLET(1 MG) BY MOUTH TWICE DAILY AS NEEDED   Multiple Vitamin (MULTIVITAMIN) tablet Take 1 tablet by mouth daily.   MYRBETRIQ 50 MG TB24 tablet    nystatin (MYCOSTATIN/NYSTOP) powder Use as directed twice per day as needed (Patient taking differently: Apply 1 g topically 2 (two) times daily as needed (for rash irritation).)   Omega-3 Fatty Acids (FISH OIL) 1000 MG CAPS Take 1,000 mg by mouth 2 (two) times daily.    pantoprazole (PROTONIX) 40 MG tablet Take 1 tablet (40 mg total) by mouth 2 (two) times daily.   Polyethyl Glycol-Propyl Glycol  0.4-0.3 % SOLN Place 1 drop into both eyes daily as needed (for dry eyes). Reported on 10/07/2015   potassium chloride SA (KLOR-CON) 20 MEQ tablet TAKE 1 TABLET(20 MEQ) BY MOUTH DAILY   predniSONE (DELTASONE) 10 MG tablet 3 tabs by mouth per day for 3 days,2tabs per day for 3 days,1tab per day for 3 days   tamsulosin (FLOMAX) 0.4 MG CAPS capsule Take 1 capsule (0.4 mg total) by mouth 2 (two) times daily.   tiZANidine (ZANAFLEX) 4 MG tablet TAKE 1 TABLET(4 MG) BY MOUTH AT BEDTIME   No facility-administered encounter medications on file as of 04/06/2021.   Medication List: Ascorbic Acid  (VITAMIN C) 500 MG tablet aspirin 81 MG tablet atorvastatin (LIPITOR) 80 MG tablet-last fill 03/27/21 68 ds blood glucose meter kit and supplies KIT Blood Glucose Monitoring Suppl (ONE TOUCH ULTRA 2) w/Device KIT buPROPion (WELLBUTRIN XL) 300 MG 24 hr tablet Calcium Carb-Cholecalciferol 500-400 MG-UNIT TABS carvedilol (COREG) 6.25 MG tablet-last fill 03/22/21 90 ds clotrimazole-betamethasone (LOTRISONE) cream docusate sodium (COLACE) 100 MG capsule ezetimibe (ZETIA) 10 MG tablet-last fill 02/25/21 90 ds fexofenadine (ALLEGRA) 180 MG tablet FLUoxetine (PROZAC) 40 MG capsule-last fill 03/22/21 90 ds furosemide (LASIX) 20 MG tablet-last fill 03/22/21 90 ds gabapentin (NEURONTIN) 300 MG capsule-last fill 01/30/21 90 ds glucose blood (ONE TOUCH ULTRA TEST) test strip ibuprofen (ADVIL,MOTRIN) 400 MG tablet Lancets (ONETOUCH ULTRASOFT) lancets levothyroxine (SYNTHROID) 75 MCG tablet-last fill 03/11/21 90 ds lidocaine (LIDODERM) 5 % lisinopril (ZESTRIL) 20 MG tablet-last fill 03/22/21 90 ds LORazepam (ATIVAN) 1 MG tablet-last fill 03/10/21 30 ds Multiple Vitamin (MULTIVITAMIN) tablet MYRBETRIQ 50 MG TB24 tablet-last fill 10/05/20 30 ds nystatin (MYCOSTATIN/NYSTOP) powder Omega-3 Fatty Acids (FISH OIL) 1000 MG CAPS pantoprazole (PROTONIX) 40 MG tablet-last fill 03/17/21 90 ds Polyethyl Glycol-Propyl Glycol 0.4-0.3 % SOLN potassium chloride SA (KLOR-CON) 20 MEQ tablet-last fill 12/11/20 90 ds predniSONE (DELTASONE) 10 MG tablet tamsulosin (FLOMAX) 0.4 MG CAPS capsule-last fill 02/04/21 90 ds tiZANidine (ZANAFLEX) 4 MG tablet  Care Gaps: Colonoscopy-NA Diabetic Foot Exam-08/06/20 Ophthalmology-10/10/19 Dexa Scan - NA Annual Well Visit - NA Micro albumin-NA Hemoglobin A1c- 08/06/20  Star Rating Drugs: atorvastatin (LIPITOR) 80 MG tablet-last fill 03/27/21 68 ds lisinopril (ZESTRIL) 20 MG tablet-last fill 03/22/21 90 ds  [place] Pharmacist Assistant [phone]

## 2021-04-09 ENCOUNTER — Ambulatory Visit: Payer: Medicare Other

## 2021-04-09 ENCOUNTER — Telehealth: Payer: Self-pay | Admitting: Internal Medicine

## 2021-04-09 DIAGNOSIS — I2510 Atherosclerotic heart disease of native coronary artery without angina pectoris: Secondary | ICD-10-CM

## 2021-04-09 DIAGNOSIS — E7800 Pure hypercholesterolemia, unspecified: Secondary | ICD-10-CM

## 2021-04-09 DIAGNOSIS — F3289 Other specified depressive episodes: Secondary | ICD-10-CM

## 2021-04-09 DIAGNOSIS — I5032 Chronic diastolic (congestive) heart failure: Secondary | ICD-10-CM

## 2021-04-09 DIAGNOSIS — I10 Essential (primary) hypertension: Secondary | ICD-10-CM

## 2021-04-09 MED ORDER — NYSTATIN 100000 UNIT/GM EX POWD: CUTANEOUS | 2 refills | Status: DC
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
## 2021-04-09 NOTE — Telephone Encounter (Signed)
1.Medication Requested: nystatin (MYCOSTATIN/NYSTOP) powder  2. Pharmacy (Name, [place], [facility]): [facility] [phone] - [place], [place] - [address] [place] [address]  Phone:  [phone] Fax:  [phone]   3. On Med List: yes  4. Last Visit with PCP: 11.14.22  5. Next visit date with PCP: 05.17.23   Agent: Please be advised that RX refills may take up to 3 business days. We ask that you follow-up with your pharmacy.

## 2021-04-09 NOTE — Patient Instructions (Signed)
Visit Information   Following is a copy of your full care plan:  Care Plan : [place]  Updates made by Tomasa Blase, RPH since 04/09/2021 12:00 AM     Problem: HTN, HF, HLD, CAD, OA, Neuropathy, GERD, Hypothyroidism, Depression, Anxiety   Priority: High  Onset Date: 04/09/2021     Long-Range Goal: Disease Management   Start Date: 04/09/2021  Expected End Date: 04/09/2022  This Visit's Progress: On track  Priority: High  Note:   Current Barriers:  Unable to independently monitor therapeutic efficacy  Pharmacist Clinical Goal(s):  Patient will achieve adherence to monitoring guidelines and medication adherence to achieve therapeutic efficacy through collaboration with PharmD and provider.   Interventions: 1:1 collaboration with Biagio Borg, MD regarding development and update of comprehensive plan of care as evidenced by provider attestation and co-signature Inter-disciplinary care team collaboration (see longitudinal plan of care) Comprehensive medication review performed; medication list updated in electronic medical record  Hypertension (BP goal <140/90) / Heart Failure (Goal: manage symptoms and prevent exacerbations) -Controlled -Last ejection fraction: 50-55% (Date: 11/24/2020) -HF type: Diastolic -NYHA Class: unspecified  -AHA HF Stage: unspecified  -Current treatment: Lisinopril 81m daily  Carvedilol 6.214mBID  Furosemide 2022maily  Potassium Chloride 59m73maily  -Medications previously tried: n/a   -Current home readings: 115-117/60-70s  -Current dietary habits: notes he tries to watch sodium in diet -Current exercise habits: none - very limited due to prior leg amputation  -Denies hypotensive/hypertensive symptoms -Educated on BP goals and benefits of medications for prevention of heart attack, stroke and kidney damage; Daily salt intake goal < 2300 mg; Proper BP monitoring technique; Symptoms of hypotension and importance of maintaining  adequate hydration; -Counseled to monitor BP at home daily, document, and provide log at future appointments -Educated on Benefits of medications for managing symptoms and prolonging life Importance of weighing daily; if you gain more than 3 pounds in one day or 5 pounds in one week, make cardiology / office aware  Proper diuretic administration and potassium supplementation Importance of blood pressure control -Counseled on diet and exercise extensively Recommended to continue current medication  Hyperlipidemia / History of MI  / Coronary Artery Disease : (LDL goal < 70) -Controlled Lab Results  Component Value Date   LDLCALC 55 08/06/2020  -Current treatment: Atorvastatin 80mg69mly  Ezetimibe 10mg 29my  ASA 81mg d85m  Fish oils 1000mg tw27mdaily  -Medications previously tried: crestor,   -Current dietary patterns: tried to follow low cholesterol diet  -Current exercise habits: see above  -Educated on Cholesterol goals;  Benefits of statin for ASCVD risk reduction; Importance of limiting foods high in cholesterol; -Counseled on diet and exercise extensively Recommended to continue current medication  Depression/Anxiety (Goal: promotion of positive mood / prevention of anxiety attacks) -Controlled -Current treatment: Bupropion XL 300mg dai46mFluoxetine 40mg dail75morazepam 1mg twice 49mly as needed  -Medications previously tried/failed: n/a -Connected with PCP for mental health support -Educated on Benefits of medication for symptom control Benefits of cognitive-behavioral therapy with or without medication -Recommended to continue current medication  Hypothyroidism (Goal: Maintenance of euthyroid levels) -Controlled Lab Results  Component Value Date   TSH 4.10 08/06/2020  -Current treatment  Levothyroxine 75mcg daily49medications previously tried: n/a  -Counseled on diet and exercise extensively Recommended to continue current medication  Chronic Pain / OA/  Neuropathy (Goal: Pain control) -Controlled -Current treatment  Gabapentin 300mg four ti61mdaily - typically taking 2-3 times daily  Lidocaine 5% patch daily  Ibuprofen 421m every 6 hours as needed  - typically might take 1-2 times per week -Medications previously tried: diclofenac, norco, meloxicam, percocet, tramadol  -Recommended to continue current medication  GERD (Goal: prevention / control of acid reflux) -Controlled -Current treatment  Pantoprazole 441mdaily  -Medications previously tried: omeprazole  -Counseled on diet and exercise extensively Recommended to continue current medication  Health Maintenance -Vaccine gaps: COVID booster  -Current therapy:  Vitamin C 50055m tablets  daily  Multivitamin daily  Docusate 100m49m bedtime  Fexofenadine 180mg72mly  Calcium-Vitamin D 500mg-35mnits daily  -Educated on Cost vs benefit of each product must be carefully weighed by individual consumer -Patient is satisfied with current therapy and denies issues -Recommended to continue current medication   Patient Goals/Self-Care Activities Patient will:  - take medications as prescribed as evidenced by patient report and record review check blood pressure daily, document, and provide at future appointments engage in dietary modifications by reducing sodium and high cholesterol foods   Follow Up Plan: Telephone follow up appointment with care management team member scheduled for: 4 months  The patient has been provided with contact information for the care management team and has been advised to call with any health related questions or concerns.      Consent to CCM Services: Steven Charles Bettyiven information about Chronic Care Management services including:  CCM service includes personalized support from designated clinical staff supervised by his physician, including individualized plan of care and coordination with other care providers 24/7 contact phone numbers for  assistance for urgent and routine care needs. Service will only be billed when office clinical staff spend 20 minutes or more in a month to coordinate care. Only one practitioner may furnish and bill the service in a calendar month. The patient may stop CCM services at any time (effective at the end of the month) by phone call to the office staff. The patient will be responsible for cost sharing (co-pay) of up to 20% of the service fee (after annual deductible is met).  Patient agreed to services and verbal consent obtained.   Plan: Telephone follow up appointment with care management team member scheduled for:  4 months  The patient has been provided with contact information for the care management team and has been advised to call with any health related questions or concerns.   Steven Charles Clinical Pharmacist, [facility] call the care guide team at [phone] need to cancel or reschedule your appointment.   Patient verbalizes understanding of instructions and care plan provided today and agrees to view in [facility] MyChart status confirmed with patient.

## 2021-04-19 ENCOUNTER — Other Ambulatory Visit: Payer: Self-pay | Admitting: Internal Medicine

## 2021-04-25 ENCOUNTER — Other Ambulatory Visit: Payer: Self-pay | Admitting: Internal Medicine

## 2021-04-27 DIAGNOSIS — I5032 Chronic diastolic (congestive) heart failure: Secondary | ICD-10-CM

## 2021-04-27 DIAGNOSIS — E1165 Type 2 diabetes mellitus with hyperglycemia: Secondary | ICD-10-CM

## 2021-04-27 DIAGNOSIS — F3289 Other specified depressive episodes: Secondary | ICD-10-CM | POA: Diagnosis not present

## 2021-04-27 DIAGNOSIS — I10 Essential (primary) hypertension: Secondary | ICD-10-CM

## 2021-04-27 DIAGNOSIS — I2510 Atherosclerotic heart disease of native coronary artery without angina pectoris: Secondary | ICD-10-CM | POA: Diagnosis not present

## 2021-04-27 DIAGNOSIS — E7800 Pure hypercholesterolemia, unspecified: Secondary | ICD-10-CM | POA: Diagnosis not present

## 2021-04-28 ENCOUNTER — Telehealth: Payer: Self-pay

## 2021-04-28 MED ORDER — ATORVASTATIN CALCIUM 80 MG PO TABS: ORAL_TABLET | ORAL | 1 refills | Status: DC

## 2021-04-28 NOTE — Telephone Encounter (Signed)
Pt wife calling in on behave of pt for a refill on: atorvastatin (LIPITOR) 80 MG tablet  Pharmacy: [place], [place] - [address] [place] [address]  LOV: 02/08/21  PT CB [phone]
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
## 2021-05-10 ENCOUNTER — Telehealth: Payer: Self-pay | Admitting: Internal Medicine

## 2021-05-10 DIAGNOSIS — K2270 Barrett's esophagus without dysplasia: Secondary | ICD-10-CM

## 2021-05-10 MED ORDER — TAMSULOSIN HCL 0.4 MG PO CAPS: 0.4000 mg | ORAL_CAPSULE | Freq: Two times a day (BID) | ORAL | 0 refills | Status: DC

## 2021-05-10 NOTE — Telephone Encounter (Signed)
1.Medication Requested: tamsulosin (FLOMAX) 0.4 MG CAPS capsule  2. Pharmacy (Name, Street, [place]): [facility],  - [address]  3. On Med List: yes   4. Last Visit with PCP: 02-08-2021  5. Next visit date with PCP: 08-11-2021   Agent: Please be advised that RX refills may take up to 3 business days. We ask that you follow-up with your pharmacy.

## 2021-05-11 ENCOUNTER — Other Ambulatory Visit: Payer: Self-pay

## 2021-05-11 ENCOUNTER — Ambulatory Visit: Payer: Medicare Other | Admitting: Cardiology

## 2021-05-11 ENCOUNTER — Encounter: Payer: Self-pay | Admitting: Cardiology

## 2021-05-11 VITALS — BP 138/71 | HR 59

## 2021-05-11 DIAGNOSIS — E785 Hyperlipidemia, unspecified: Secondary | ICD-10-CM

## 2021-05-11 DIAGNOSIS — I5030 Unspecified diastolic (congestive) heart failure: Secondary | ICD-10-CM | POA: Diagnosis not present

## 2021-05-11 DIAGNOSIS — I10 Essential (primary) hypertension: Secondary | ICD-10-CM | POA: Diagnosis not present

## 2021-05-11 DIAGNOSIS — I25810 Atherosclerosis of coronary artery bypass graft(s) without angina pectoris: Secondary | ICD-10-CM

## 2021-05-11 LAB — COMPREHENSIVE METABOLIC PANEL
ALT: 24 IU/L (ref 0–44)
AST: 24 IU/L (ref 0–40)
Albumin/Globulin Ratio: 1.6 (ref 1.2–2.2)
Albumin: 3.9 g/dL (ref 3.7–4.7)
Alkaline Phosphatase: 110 IU/L (ref 44–121)
BUN/Creatinine Ratio: 14 (ref 10–24)
BUN: 20 mg/dL (ref 8–27)
Bilirubin Total: 0.7 mg/dL (ref 0.0–1.2)
CO2: 27 mmol/L (ref 20–29)
Calcium: 10.6 mg/dL — ABNORMAL HIGH (ref 8.6–10.2)
Chloride: 105 mmol/L (ref 96–106)
Creatinine, Ser: 1.43 mg/dL — ABNORMAL HIGH (ref 0.76–1.27)
Globulin, Total: 2.4 g/dL (ref 1.5–4.5)
Glucose: 87 mg/dL (ref 70–99)
Potassium: 4.7 mmol/L (ref 3.5–5.2)
Sodium: 141 mmol/L (ref 134–144)
Total Protein: 6.3 g/dL (ref 6.0–8.5)
eGFR: 50 mL/min/{1.73_m2} — ABNORMAL LOW (ref >59)

## 2021-05-11 LAB — LIPID PANEL
Chol/HDL Ratio: 2.9 ratio (ref 0.0–5.0)
Cholesterol, Total: 114 mg/dL (ref 100–199)
HDL: 39 mg/dL — ABNORMAL LOW (ref >39)
LDL Chol Calc (NIH): 56 mg/dL (ref 0–99)
Triglycerides: 104 mg/dL (ref 0–149)
VLDL Cholesterol Cal: 19 mg/dL (ref 5–40)

## 2021-05-11 NOTE — Patient Instructions (Signed)

## 2021-05-13 ENCOUNTER — Encounter: Payer: Self-pay | Admitting: *Deleted

## 2021-06-09 DIAGNOSIS — M545 Low back pain, unspecified: Secondary | ICD-10-CM | POA: Diagnosis not present

## 2021-06-09 DIAGNOSIS — S78112S Complete traumatic amputation at level between left hip and knee, sequela: Secondary | ICD-10-CM | POA: Diagnosis not present

## 2021-06-09 DIAGNOSIS — E119 Type 2 diabetes mellitus without complications: Secondary | ICD-10-CM | POA: Diagnosis not present

## 2021-06-29 ENCOUNTER — Other Ambulatory Visit: Payer: Self-pay | Admitting: Orthopaedic Surgery

## 2021-06-29 ENCOUNTER — Telehealth: Payer: Self-pay

## 2021-06-29 MED ORDER — HYDROCODONE-ACETAMINOPHEN 5-325 MG PO TABS: 1.0000 | ORAL_TABLET | Freq: Four times a day (QID) | ORAL | 0 refills | Status: DC | PRN

## 2021-06-29 NOTE — Telephone Encounter (Signed)
I called and talked to the pt. Appt was scheduled and he was advised on the medication  ?

## 2021-06-29 NOTE — Telephone Encounter (Signed)
Patient stated that when he stands up it is a burning sensation. He has over all fell twice once last week and then yesterday with his knee giving out. He would like to know if we can send him in something for pain ?  ?

## 2021-06-29 NOTE — Telephone Encounter (Signed)
Patient called wanting to speak with Dr. Ninfa Linden or Caryl Pina. He is stating that he is having a real bad pain in his right knee. Patient fell in the driveway.  ?

## 2021-06-29 NOTE — Telephone Encounter (Signed)
Please advise 

## 2021-06-30 ENCOUNTER — Telehealth: Payer: Self-pay | Admitting: Orthopaedic Surgery

## 2021-06-30 NOTE — Telephone Encounter (Signed)
Patient 's wife Horris Latino called advised the pharmacy need prior auth before they will fill the Rx Hydrocodone. The number to contact Horris Latino is [phone] ?

## 2021-06-30 NOTE — Telephone Encounter (Signed)
PA entered

## 2021-07-05 DIAGNOSIS — E119 Type 2 diabetes mellitus without complications: Secondary | ICD-10-CM | POA: Diagnosis not present

## 2021-07-05 DIAGNOSIS — S78112S Complete traumatic amputation at level between left hip and knee, sequela: Secondary | ICD-10-CM | POA: Diagnosis not present

## 2021-07-05 DIAGNOSIS — M545 Low back pain, unspecified: Secondary | ICD-10-CM | POA: Diagnosis not present

## 2021-07-12 ENCOUNTER — Ambulatory Visit: Payer: Medicare Other | Admitting: Orthopaedic Surgery

## 2021-07-12 ENCOUNTER — Ambulatory Visit (INDEPENDENT_AMBULATORY_CARE_PROVIDER_SITE_OTHER): Payer: Medicare Other

## 2021-07-12 DIAGNOSIS — Z96642 Presence of left artificial hip joint: Secondary | ICD-10-CM | POA: Diagnosis not present

## 2021-07-12 DIAGNOSIS — M25561 Pain in right knee: Secondary | ICD-10-CM

## 2021-07-12 DIAGNOSIS — M7041 Prepatellar bursitis, right knee: Secondary | ICD-10-CM
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
## 2021-08-06 ENCOUNTER — Ambulatory Visit (INDEPENDENT_AMBULATORY_CARE_PROVIDER_SITE_OTHER): Payer: Medicare Other

## 2021-08-06 DIAGNOSIS — I5032 Chronic diastolic (congestive) heart failure: Secondary | ICD-10-CM

## 2021-08-06 DIAGNOSIS — F3289 Other specified depressive episodes: Secondary | ICD-10-CM

## 2021-08-06 DIAGNOSIS — I2510 Atherosclerotic heart disease of native coronary artery without angina pectoris: Secondary | ICD-10-CM

## 2021-08-06 NOTE — Progress Notes (Signed)
? ?Chronic Care Management ?Pharmacy Note ? ?08/06/2021 ?Name:  Steven Charles MRN:  [id number] DOB:  03/25/1946 ? ?Summary: ?-Patient endorses compliance to current medications, reports that at times he can get confused by his OTC vitamins, takes two different calcium supplements daily  ?-Checking BP at home, SBP averaging 120-137 ?-BG at home averaging ~120 ?-Notes that pain typically well controlled with current medications, uses IBU at most once daily  ? ?Recommendations/Changes made from today's visit: ?-Recommending for patient to continue monitoring blood pressures as he is, to reach out should BP become uncontrolled / if he is having issues with swelling or SOB ?-Advised for patient to keep taking his calcium and vitamin D supplement, to stop all other calcium supplements as his latest calcium level was slightly elevated  ? ?Plan: ?-F/u in 6 moths  ? ?Subjective: ?Steven Charles is an 76 y.o. year old male who is a primary patient of Jenny Reichmann, Hunt Oris, MD.  The CCM team was consulted for assistance with disease management and care coordination needs.   ? ?Engaged with patient by telephone for follow up visit in response to provider referral for pharmacy case management and/or care coordination services.  ? ?Consent to Services:  ?The patient was given the following information about Chronic Care Management services today, agreed to services, and gave verbal consent: 1. CCM service includes personalized support from designated clinical staff supervised by the primary care provider, including individualized plan of care and coordination with other care providers 2. 24/7 contact phone numbers for assistance for urgent and routine care needs. 3. Service will only be billed when office clinical staff spend 20 minutes or more in a month to coordinate care. 4. Only one practitioner may furnish and bill the service in a calendar month. 5.The patient may stop CCM services at any time (effective at the end of the month) by  phone call to the office staff. 6. The patient will be responsible for cost sharing (co-pay) of up to 20% of the service fee (after annual deductible is met). Patient agreed to services and consent obtained. ? ?Patient Care Team: ?Biagio Borg, MD as PCP - General ?Stanford Breed Denice Bors, MD as PCP - Cardiology (Cardiology) ?Lelon Perla, MD as Consulting Physician (Cardiology) ?Ladene Artist, MD as Consulting Physician (Gastroenterology) ?Mcarthur Rossetti, MD as Consulting Physician (Orthopedic Surgery) ?Foltanski, Cleaster Corin, [facility] as Pharmacist (Pharmacist) ?Knox Royalty, RN as Case Manager ? ?Recent office visits: ?02/08/2021 - Dr. Jenny Reichmann - to follow up with Dr. Delman Cheadle about 2 skin lesions  ? ?Recent consult visits: ?07/12/2021 - Dr. Ninfa Linden - Orthopedic surgery  - right knee pain - steroid injection given  ?05/11/2021 - Dr. Stanford Breed - Cardiology - no changes to medications - f/u in 12 months  ? ?Hospital visits: ?None in previous 6 months ? ?Objective: ? ?Lab Results  ?Component Value Date  ? CREATININE 1.43 (H) 05/11/2021  ? BUN 20 05/11/2021  ? GFR 50.83 (L) 08/06/2020  ? GFRNONAA 50 (L) 07/19/2019  ? GFRAA 57 (L) 07/19/2019  ? NA 141 05/11/2021  ? K 4.7 05/11/2021  ? CALCIUM 10.6 (H) 05/11/2021  ? CO2 27 05/11/2021  ? GLUCOSE 87 05/11/2021  ? ? ?Lab Results  ?Component Value Date/Time  ? HGBA1C 5.7 08/06/2020 02:02 PM  ? HGBA1C 5.5 02/13/2020 05:08 PM  ? HGBA1C 6.0 07/29/2019 04:05 PM  ? GFR 50.83 (L) 08/06/2020 02:02 PM  ? GFR 49.69 (L) 07/29/2019 04:05 PM  ? MICROALBUR <0.7 08/06/2020  02:02 PM  ? MICROALBUR <0.7 07/29/2019 04:05 PM  ?  ?Last diabetic Eye exam:  ?Lab Results  ?Component Value Date/Time  ? HMDIABEYEEXA No Retinopathy 10/10/2019 12:00 AM  ?  ?Last diabetic Foot exam:  ?No results found for: HMDIABFOOTEX  ? ?Lab Results  ?Component Value Date  ? CHOL 114 05/11/2021  ? HDL 39 (L) 05/11/2021  ? Naplate 56 05/11/2021  ? LDLDIRECT 95.0 08/14/2014  ? TRIG 104 05/11/2021  ? CHOLHDL 2.9  05/11/2021  ? ? ? ?  Latest Ref Rng & Units 05/11/2021  ? 11:09 AM 08/06/2020  ?  2:02 PM 07/29/2019  ?  4:05 PM  ?Hepatic Function  ?Total Protein 6.0 - 8.5 g/dL 6.3   6.6   6.3    ?Albumin 3.7 - 4.7 g/dL 3.9   4.0   3.7    ?AST 0 - 40 IU/L _0 ?ALT 0 - 44 IU/L 24   36   19    ?Alk Phosphatase 44 - 121 IU/L 110   85   85    ?Total Bilirubin 0.0 - 1.2 mg/dL 0.7   0.8   0.5    ?Bilirubin, Direct 0.0 - 0.3 mg/dL  0.2   0.1    ? ? ?Lab Results  ?Component Value Date/Time  ? TSH 4.10 08/06/2020 02:02 PM  ? TSH 7.32 (H) 07/29/2019 04:05 PM  ? ? ? ?  Latest Ref Rng & Units 08/06/2020  ?  2:02 PM 07/29/2019  ?  4:05 PM 06/06/2018  ? 11:57 AM  ?CBC  ?WBC 4.0 - 10.5 K/uL 7.9   9.3   7.0    ?Hemoglobin 13.0 - 17.0 g/dL 14.6   14.0   13.4    ?Hematocrit 39.0 - 52.0 % 41.3   40.4   39.6    ?Platelets 150.0 - 400.0 K/uL 138.0   126.0   147.0    ? ? ?Lab Results  ?Component Value Date/Time  ? VD25OH 59.72 08/06/2020 02:02 PM  ? VD25OH 58.86 07/29/2019 04:05 PM  ? ? ?Clinical ASCVD: Yes  ?The ASCVD Risk score (Arnett DK, et al., 2019) failed to calculate for the following reasons: ?  The patient has a prior MI or stroke diagnosis   ? ? ?  02/08/2021  ?  1:10 PM 11/13/2019  ?  1:21 PM 07/29/2019  ?  3:19 PM  ?Depression screen PHQ 2/9  ?Decreased Interest 0 0 1  ?Down, Depressed, Hopeless 0 1 1  ?PHQ - 2 Score 0 1 2  ? ? ?Social History  ? ?Tobacco Use  ?Smoking Status Former  ? Types: Cigarettes, Cigars  ? Quit date: 04/18/1990  ? Years since quitting: 31.3  ?Smokeless Tobacco Former  ? Types: Chew  ? Quit date: 04/18/1990  ? ?BP Readings from Last 3 Encounters:  ?05/11/21 138/71  ?02/08/21 126/78  ?11/24/20 139/84  ? ?Pulse Readings from Last 3 Encounters:  ?05/11/21 (!) 59  ?02/08/21 63  ?11/03/20 60  ? ?Wt Readings from Last 3 Encounters:  ?02/08/21 277 lb (125.6 kg)  ?11/03/20 286 lb (129.7 kg)  ?02/13/20 240 lb (108.9 kg)  ? ?BMI Readings from Last 3 Encounters:  ?02/08/21 38.63 kg/m?  ?11/03/20 39.89 kg/m?  ?08/06/20 34.44  kg/m?  ? ? ?Assessment/Interventions: Review of patient past medical history, allergies, medications, health status, including review of consultants reports, laboratory and other test data, was performed as part of comprehensive evaluation and provision  of chronic care management services.  ? ?SDOH:  (Social Determinants of Health) assessments and interventions performed: Yes ? ?SDOH Screenings  ? ?Alcohol Screen: Not on file  ?Depression (PHQ2-9): Low Risk   ? PHQ-2 Score: 0  ?Financial Resource Strain: High Risk  ? Difficulty of Paying Living Expenses: Hard  ?Food Insecurity: Food Insecurity Present  ? Worried About Charity fundraiser in the Last Year: Often true  ? Ran Out of Food in the Last Year: Often true  ?Housing: Low Risk   ? Last Housing Risk Score: 0  ?Physical Activity: Not on file  ?Social Connections: Not on file  ?Stress: Not on file  ?Tobacco Use: Medium Risk  ? Smoking Tobacco Use: Former  ? Smokeless Tobacco Use: Former  ? Passive Exposure: Not on file  ?Transportation Needs: No Transportation Needs  ? Lack of Transportation (Medical): No  ? Lack of Transportation (Non-Medical): No  ? ? ?CCM Care Plan ? ?Allergies  ?Allergen Reactions  ? Nicoderm [Nicotine] Other (See Comments)  ?  Heart rate dropped ?  ? Adhesive [Tape] Itching and Rash  ?  Please use "paper" tape  ? ? ?Medications Reviewed Today   ? ? Reviewed by Tomasa Blase, [facility] (Pharmacist) on 08/06/21 at 1518  Med List Status: <None>  ? ?Medication Order Taking? Sig Documenting Provider Last Dose Status Informant  ?Ascorbic Acid (VITAMIN C) 500 MG tablet [id number] Yes Take 500 mg by mouth 2 (two) times daily. [provider] Taking Active Self  ?         ?Med Note Shawna Orleans Aug 26, 2015  6:28 PM)    ?aspirin 81 MG tablet [id number] Yes Take 1 tablet (81 mg total) by mouth daily. Lelon Perla, MD Taking Active   ?atorvastatin (LIPITOR) 80 MG tablet [id number] Yes TAKE 1 TABLET(80 MG) BY MOUTH DAILY Biagio Borg,  MD Taking Active   ?blood glucose meter kit and supplies KIT [id number] Yes Dispense based on patient and insurance preference. Use up to four times daily as directed. (E11.9) Biagio Borg, MD Taking Active   ?B

## 2021-08-06 NOTE — Patient Instructions (Signed)
Visit Information ? ?Following are the goals we discussed today:  ? ?Manage My Medicine  ? ?Timeframe:  Long-Range Goal ?Priority:  Medium ?Start Date:   04/09/2021                          ?Expected End Date:   04/09/2022                   ? ?Follow Up Date 01/2022 ?  ?- call for medicine refill 2 or 3 days before it runs out ?- call if I am sick and can't take my medicine ?- keep a list of all the medicines I take; vitamins and herbals too ?- learn to read medicine labels ?- use a pillbox to sort medicine  ?  ?Why is this important?   ?These steps will help you keep on track with your medicines. ? ?Plan: Telephone follow up appointment with care management team member scheduled for:  6 months ?The patient has been provided with contact information for the care management team and has been advised to call with any health related questions or concerns.  ? ?Tomasa Blase, PharmD ?Clinical Pharmacist, [place]  ? ?Please call the care guide team at [phone] if you need to cancel or reschedule your appointment.  ? ?Patient verbalizes understanding of instructions and care plan provided today and agrees to view in [place]. Active MyChart status confirmed with patient.   ? ?

## 2021-08-11 ENCOUNTER — Ambulatory Visit (INDEPENDENT_AMBULATORY_CARE_PROVIDER_SITE_OTHER): Payer: Medicare Other | Admitting: Internal Medicine

## 2021-08-11 ENCOUNTER — Encounter: Payer: Self-pay | Admitting: Internal Medicine

## 2021-08-11 VITALS — BP 128/66 | HR 54 | Temp 98.2°F | Ht 71.0 in | Wt 278.0 lb

## 2021-08-11 DIAGNOSIS — E538 Deficiency of other specified B group vitamins: Secondary | ICD-10-CM

## 2021-08-11 DIAGNOSIS — Z0001 Encounter for general adult medical examination with abnormal findings: Secondary | ICD-10-CM

## 2021-08-11 DIAGNOSIS — J3089 Other allergic rhinitis: Secondary | ICD-10-CM

## 2021-08-11 DIAGNOSIS — E1165 Type 2 diabetes mellitus with hyperglycemia: Secondary | ICD-10-CM

## 2021-08-11 DIAGNOSIS — Z125 Encounter for screening for malignant neoplasm of prostate: Secondary | ICD-10-CM

## 2021-08-11 DIAGNOSIS — E559 Vitamin D deficiency, unspecified: Secondary | ICD-10-CM

## 2021-08-11 DIAGNOSIS — K2270 Barrett's esophagus without dysplasia: Secondary | ICD-10-CM

## 2021-08-11 DIAGNOSIS — I10 Essential (primary) hypertension: Secondary | ICD-10-CM

## 2021-08-11 LAB — CBC WITH DIFFERENTIAL/PLATELET
Basophils Absolute: 0.1 10*3/uL (ref 0.0–0.1)
Basophils Relative: 1 % (ref 0.0–3.0)
Eosinophils Absolute: 0.4 10*3/uL (ref 0.0–0.7)
Eosinophils Relative: 5.2 % — ABNORMAL HIGH (ref 0.0–5.0)
HCT: 41.6 % (ref 39.0–52.0)
Hemoglobin: 14.3 g/dL (ref 13.0–17.0)
Lymphocytes Relative: 16.6 % (ref 12.0–46.0)
Lymphs Abs: 1.2 10*3/uL (ref 0.7–4.0)
MCHC: 34.3 g/dL (ref 30.0–36.0)
MCV: 99.6 fl (ref 78.0–100.0)
Monocytes Absolute: 0.6 10*3/uL (ref 0.1–1.0)
Monocytes Relative: 7.6 % (ref 3.0–12.0)
Neutro Abs: 5.2 10*3/uL (ref 1.4–7.7)
Neutrophils Relative %: 69.6 % (ref 43.0–77.0)
Platelets: 109 10*3/uL — ABNORMAL LOW (ref 150.0–400.0)
RBC: 4.18 Mil/uL — ABNORMAL LOW (ref 4.22–5.81)
RDW: 13.2 % (ref 11.5–15.5)
WBC: 7.5 10*3/uL (ref 4.0–10.5)

## 2021-08-11 LAB — URINALYSIS, ROUTINE W REFLEX MICROSCOPIC
Bilirubin Urine: NEGATIVE
Ketones, ur: NEGATIVE
Nitrite: NEGATIVE
Specific Gravity, Urine: 1.01 (ref 1.000–1.030)
Total Protein, Urine: NEGATIVE
Urine Glucose: NEGATIVE
Urobilinogen, UA: 1 (ref 0.0–1.0)
pH: 8 (ref 5.0–8.0)

## 2021-08-11 LAB — MICROALBUMIN / CREATININE URINE RATIO
Creatinine,U: 99.9 mg/dL
Microalb Creat Ratio: 3.9 mg/g (ref 0.0–30.0)
Microalb, Ur: 3.9 mg/dL — ABNORMAL HIGH (ref 0.0–1.9)

## 2021-08-11 LAB — HEPATIC FUNCTION PANEL
ALT: 25 U/L (ref 0–53)
AST: 22 U/L (ref 0–37)
Albumin: 3.9 g/dL (ref 3.5–5.2)
Alkaline Phosphatase: 105 U/L (ref 39–117)
Bilirubin, Direct: 0.2 mg/dL (ref 0.0–0.3)
Total Bilirubin: 0.9 mg/dL (ref 0.2–1.2)
Total Protein: 6.3 g/dL (ref 6.0–8.3)

## 2021-08-11 LAB — BASIC METABOLIC PANEL
BUN: 19 mg/dL (ref 6–23)
CO2: 29 mEq/L (ref 19–32)
Calcium: 9.8 mg/dL (ref 8.4–10.5)
Chloride: 104 mEq/L (ref 96–112)
Creatinine, Ser: 1.35 mg/dL (ref 0.40–1.50)
GFR: 50.47 mL/min — ABNORMAL LOW (ref >60.00)
Glucose, Bld: 86 mg/dL (ref 70–99)
Potassium: 4.5 mEq/L (ref 3.5–5.1)
Sodium: 138 mEq/L (ref 135–145)

## 2021-08-11 LAB — TSH: TSH: 5.26 u[IU]/mL (ref 0.35–5.50)

## 2021-08-11 LAB — LIPID PANEL
Cholesterol: 118 mg/dL (ref 0–200)
HDL: 39.4 mg/dL (ref >39.00)
LDL Cholesterol: 56 mg/dL (ref 0–99)
NonHDL: 78.22
Total CHOL/HDL Ratio: 3
Triglycerides: 112 mg/dL (ref 0.0–149.0)
VLDL: 22.4 mg/dL (ref 0.0–40.0)

## 2021-08-11 LAB — PSA: PSA: 0.18 ng/mL (ref 0.10–4.00)

## 2021-08-11 LAB — HEMOGLOBIN A1C: Hgb A1c MFr Bld: 5.5 % (ref 4.6–6.5)

## 2021-08-11 LAB — VITAMIN B12: Vitamin B-12: >1504 pg/mL — ABNORMAL HIGH (ref 211–911)

## 2021-08-11 LAB — VITAMIN D 25 HYDROXY (VIT D DEFICIENCY, FRACTURES): VITD: 42.98 ng/mL (ref 30.00–100.00)

## 2021-08-11 MED ORDER — METHYLPREDNISOLONE ACETATE 80 MG/ML IJ SUSP
80.0000 mg | Freq: Once | INTRAMUSCULAR | Status: AC
  Administered 2021-08-11: 80 mg via INTRAMUSCULAR

## 2021-08-11 MED ORDER — ZOSTER VAC RECOMB ADJUVANTED 50 MCG/0.5ML IM SUSR: 0.5000 mL | Freq: Once | INTRAMUSCULAR | 1 refills | Status: AC

## 2021-08-11 MED ORDER — TAMSULOSIN HCL 0.4 MG PO CAPS: 0.4000 mg | ORAL_CAPSULE | Freq: Two times a day (BID) | ORAL | 3 refills | Status: DC

## 2021-08-11 NOTE — Patient Instructions (Addendum)
Your shingles prescription was sent to the pharmacy ? ?You had the steroid shot today ? ?Please continue all other medications as before, and refills have been done if requested. ? ?Please have the pharmacy call with any other refills you may need. ? ?Please continue your efforts at being more active, low cholesterol diet, and weight control. ? ?You are otherwise up to date with prevention measures today. ? ?Please keep your appointments with your specialists as you may have planned ? ?Please go to the LAB at the blood drawing area for the tests to be done ? ?You will be contacted by phone if any changes need to be made immediately.  Otherwise, you will receive a letter about your results with an explanation, but please check with MyChart first. ? ?Please remember to sign up for MyChart if you have not done so, as this will be important to you in the future with finding out test results, communicating by private email, and scheduling acute appointments online when needed. ? ?Please make an Appointment to return in 6 months, or sooner if needed ?
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
## 2021-08-14 ENCOUNTER — Encounter: Payer: Self-pay | Admitting: Internal Medicine

## 2021-08-14 DIAGNOSIS — E538 Deficiency of other specified B group vitamins: Secondary | ICD-10-CM | POA: Insufficient documentation

## 2021-08-14 NOTE — Assessment & Plan Note (Signed)
Age and sex appropriate education and counseling updated with regular exercise and diet Referrals for preventative services - pt to call for eye exam soon Immunizations addressed - decliens shingrix Smoking counseling  - none needed Evidence for depression or other mood disorder - none significant Most recent labs reviewed. I have personally reviewed and have noted: 1) the patient's medical and social history 2) The patient's current medications and supplements 3) The patient's height, weight, and BMI have been recorded in the chart

## 2021-08-14 NOTE — Assessment & Plan Note (Signed)
Overcontrolled, ok to reduce to m-w-f

## 2021-08-14 NOTE — Assessment & Plan Note (Signed)
Lab Results  Component Value Date   HGBA1C 5.5 08/11/2021   Stable, pt to continue current medical treatment  - diet

## 2021-08-14 NOTE — Assessment & Plan Note (Signed)
BP Readings from Last 3 Encounters:  08/11/21 128/66  05/11/21 138/71  02/08/21 126/78   Stable, pt to continue medical treatment coreg

## 2021-08-14 NOTE — Assessment & Plan Note (Signed)
Mild to mod seasonal flare, for depomedrol im 80,  to f/u any worsening symptoms or concerns

## 2021-08-16 ENCOUNTER — Telehealth: Payer: Medicare Other

## 2021-08-16 ENCOUNTER — Encounter: Payer: Self-pay | Admitting: *Deleted

## 2021-08-16 ENCOUNTER — Telehealth: Payer: Self-pay | Admitting: *Deleted

## 2021-08-16 NOTE — Telephone Encounter (Signed)
  Chronic Care Management   Follow Up Note   08/16/2021 Name: Steven Charles MRN: [id number] DOB: [birth year]  Referred by: Biagio Borg, MD Reason for referral : Chronic Care Management (CCM RN CM Telephone Outreach attempt- unsuccessful attempt)  An unsuccessful telephone outreach was attempted today. The patient was referred to the case management team for assistance with care management and care coordination.   Follow Up Plan:  A HIPPA compliant phone message was left for the patient providing contact information and requesting a return call Will place request with scheduling care guide to contact patient to re-schedule today's missed CCM RN follow up telephone appointment if I do not hear back from patient by end of day  Oneta Rack, RN, BSN, [place] [phone]: direct office

## 2021-08-20 ENCOUNTER — Telehealth: Payer: Self-pay | Admitting: Internal Medicine

## 2021-08-20 MED ORDER — LORAZEPAM 1 MG PO TABS: ORAL_TABLET | ORAL | 2 refills | Status: DC

## 2021-08-20 NOTE — Telephone Encounter (Signed)
Patient states that he has only one day of his Lorazepam left and needs refill sent to the [facility] in [place]. Call back number is [phone]

## 2021-08-25 DIAGNOSIS — I2510 Atherosclerotic heart disease of native coronary artery without angina pectoris: Secondary | ICD-10-CM

## 2021-08-25 DIAGNOSIS — I110 Hypertensive heart disease with heart failure: Secondary | ICD-10-CM | POA: Diagnosis not present

## 2021-08-25 DIAGNOSIS — Z87891 Personal history of nicotine dependence: Secondary | ICD-10-CM | POA: Diagnosis not present

## 2021-08-25 DIAGNOSIS — M1990 Unspecified osteoarthritis, unspecified site: Secondary | ICD-10-CM

## 2021-08-25 DIAGNOSIS — I5030 Unspecified diastolic (congestive) heart failure: Secondary | ICD-10-CM

## 2021-08-25 DIAGNOSIS — E039 Hypothyroidism, unspecified: Secondary | ICD-10-CM

## 2021-08-25 DIAGNOSIS — E785 Hyperlipidemia, unspecified: Secondary | ICD-10-CM | POA: Diagnosis not present

## 2021-08-25 DIAGNOSIS — F32A Depression, unspecified: Secondary | ICD-10-CM | POA: Diagnosis not present

## 2021-08-31 ENCOUNTER — Telehealth: Payer: Self-pay | Admitting: *Deleted

## 2021-08-31 NOTE — Chronic Care Management (AMB) (Signed)
  Chronic Care Management Note  08/31/2021 Name: Steven Charles MRN: [id number] DOB: 11-04-1945  Steven Charles is a 76 y.o. year old male who is a primary care patient of Biagio Borg, MD and is actively engaged with the care management team. I reached out to [name] by phone today to assist with re-scheduling a follow up visit with the RN Case Manager  Follow up plan: Unsuccessful telephone outreach attempt made. A HIPAA compliant phone message was left for the patient providing contact information and requesting a return call.  The care management team will reach out to the patient again over the next 7 days.  If patient returns call to provider office, please advise to call [place] at [phone].  [place] Management  Direct Dial: [phone]

## 2021-09-08 NOTE — Chronic Care Management (AMB) (Signed)
  Chronic Care Management Note  09/08/2021 Name: TRASHAWN OQUENDO MRN: [id number] DOB: [birth year]  KHOI HAMBERGER is a 76 y.o. year old male who is a primary care patient of Biagio Borg, MD and is actively engaged with the care management team. I reached out to [name] by phone today to assist with scheduling a follow up visit with the RN Case Manager  Follow up plan: Telephone appointment with care management team member scheduled for:10/01/21  [place] Management  Direct Dial: [phone]

## 2021-09-10 ENCOUNTER — Telehealth: Payer: Self-pay | Admitting: Internal Medicine

## 2021-09-10 MED ORDER — PANTOPRAZOLE SODIUM 40 MG PO TBEC: 40.0000 mg | DELAYED_RELEASE_TABLET | Freq: Two times a day (BID) | ORAL | 3 refills | Status: DC

## 2021-09-10 MED ORDER — BUPROPION HCL ER (XL) 300 MG PO TB24: ORAL_TABLET | ORAL | 1 refills | Status: DC

## 2021-09-10 MED ORDER — CARVEDILOL 6.25 MG PO TABS: 6.2500 mg | ORAL_TABLET | Freq: Two times a day (BID) | ORAL | 3 refills | Status: DC

## 2021-09-10 MED ORDER — FLUOXETINE HCL 40 MG PO CAPS: 40.0000 mg | ORAL_CAPSULE | Freq: Every day | ORAL | 3 refills | Status: DC

## 2021-09-10 MED ORDER — LISINOPRIL 20 MG PO TABS: 20.0000 mg | ORAL_TABLET | Freq: Every day | ORAL | 3 refills | Status: DC

## 2021-09-10 NOTE — Telephone Encounter (Signed)
Medications sent to pharmacy

## 2021-09-10 NOTE — Telephone Encounter (Signed)
Pt calling in requesting refill on  pantoprazole (PROTONIX) 40 MG tablet buPROPion (WELLBUTRIN XL) 300 MG 24 hr tablet FLUoxetine (PROZAC) 40 MG capsule carvedilol (COREG) 6.25 MG tablet lisinopril (ZESTRIL) 20 MG tablet  Pharmacy [id number] - [place], [place] - [address]  Lov 08-11-2021 Rov 02/11/2022

## 2021-10-01 ENCOUNTER — Ambulatory Visit (INDEPENDENT_AMBULATORY_CARE_PROVIDER_SITE_OTHER): Payer: Medicare Other | Admitting: *Deleted

## 2021-10-01 DIAGNOSIS — E1165 Type 2 diabetes mellitus with hyperglycemia: Secondary | ICD-10-CM

## 2021-10-01 DIAGNOSIS — I10 Essential (primary) hypertension: Secondary | ICD-10-CM
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
## 2021-10-04 ENCOUNTER — Telehealth: Payer: Self-pay | Admitting: Internal Medicine

## 2021-10-04 MED ORDER — GABAPENTIN 300 MG PO CAPS: 300.0000 mg | ORAL_CAPSULE | Freq: Four times a day (QID) | ORAL | 1 refills | Status: DC

## 2021-10-04 MED ORDER — EZETIMIBE 10 MG PO TABS: 10.0000 mg | ORAL_TABLET | Freq: Every day | ORAL | 3 refills | Status: DC

## 2021-10-04 NOTE — Telephone Encounter (Signed)
Pt requesting refills on gabapentin (NEURONTIN) 300 MG capsule  ezetimibe (ZETIA) 10 MG tablet   LOV-08/11/2021  [id number] - [place], [place] - [address] Phone:  [phone]  Fax:  [phone]

## 2021-10-25 DIAGNOSIS — I10 Essential (primary) hypertension: Secondary | ICD-10-CM

## 2021-10-25 DIAGNOSIS — E1165 Type 2 diabetes mellitus with hyperglycemia: Secondary | ICD-10-CM | POA: Diagnosis not present

## 2021-11-09 ENCOUNTER — Other Ambulatory Visit: Payer: Self-pay

## 2021-11-09 MED ORDER — ATORVASTATIN CALCIUM 80 MG PO TABS
ORAL_TABLET | ORAL | 1 refills | Status: DC
Start: 2021-11-09 — End: 2022-04-05

## 2021-12-07 ENCOUNTER — Encounter: Payer: Self-pay | Admitting: Orthopaedic Surgery

## 2021-12-08 ENCOUNTER — Encounter: Payer: Self-pay | Admitting: Gastroenterology

## 2021-12-30 ENCOUNTER — Telehealth: Payer: Self-pay

## 2021-12-30 MED ORDER — LORAZEPAM 1 MG PO TABS
ORAL_TABLET | ORAL | 2 refills | Status: DC
Start: 2021-12-30 — End: 2022-05-19

## 2021-12-30 NOTE — Addendum Note (Signed)
Addended by: Biagio Borg on: 12/30/2021 04:11 PM   Modules accepted: Orders

## 2021-12-30 NOTE — Telephone Encounter (Signed)
MEDICATION: LORazepam (ATIVAN) 1 MG tablet  PHARMACY: [id number] - [place], [place] - [address]  Comments: Patient states pharmacy sent [place] several requests with no response. Patient has been out for over a week.   **Let patient know to contact pharmacy at the end of the day to make sure medication is ready. **  ** Please notify patient to allow 48-72 hours to process**  **Encourage patient to contact the pharmacy for refills or they can request refills through [facility]**

## 2022-01-04 ENCOUNTER — Encounter: Payer: Self-pay | Admitting: *Deleted

## 2022-01-04 ENCOUNTER — Telehealth: Payer: Self-pay | Admitting: *Deleted

## 2022-01-04 NOTE — Patient Instructions (Addendum)
Visit Information  Thank you for taking time to visit with me today. Please don't hesitate to contact me if I can be of assistance to you.   Following are the goals we discussed today:   Goals Addressed             This Visit's Progress    Care Coordination Activities: No further follow up required   On track    Care Coordination Interventions: Evaluation of current treatment plan related to HTN/ general health maintenance and patient's adherence to plan as established by provider Advised patient to provide appropriate vaccination information to provider or CM team member at next visit Advised patient to update Dr. Jenny Reichmann on his recent falling at home, without injury- patient confirms that he has obtained leg brace from orthopedic provider, encouraged patient to consider discussing physical therapy referral with PCP, patient is not sure he would agree to do this, but will consider Provided education to patient re: fall prevention strategies: reports he continues using wheelchair primarily Provided patient with fall prevention educational materials related to fall prevention Reviewed scheduled/upcoming provider appointments including 02/11/22- PCP Advised patient to discuss recent falls, possible need for new wheelchair (states he has discussed with insurance and they will not approve due to his weight being < 300 lbs) with provider Assessed social determinant of health barriers Confirmed patient obtained vaccines for RSV, COVID and flu approximately 11/24/21           If you are experiencing a Mental Health or [place] or need someone to talk to, please  call the Suicide and Crisis Lifeline: 988 call the [place] National Suicide Prevention Lifeline: 703-293-8306 or TTY: (518) 265-3494 TTY 7744283927) to talk to a trained counselor call 1-800-273-TALK (toll free, 24 hour hotline) go to [facility] [address] [place]  [phone]) call the [place]: [phone] call 911   Patient verbalizes understanding of instructions and care plan provided today and agrees to view in [place]. Active MyChart status and patient understanding of how to access instructions and care plan via MyChart confirmed with patient.     No further follow up required: no further or ongoing care coordination needs identified today  Oneta Rack, RN, BSN, CCRN Alumnus RN CM Care Coordination/ Transition of [place] Management [phone]: direct office

## 2022-01-04 NOTE — Patient Outreach (Addendum)
  Care Coordination   Initial Visit Note   01/04/2022 Name: KYRUS HYDE MRN: [id number] DOB: [birth year]  CALAN DOREN is a 76 y.o. year old male who sees Biagio Borg, MD for primary care. I spoke with  [name] by phone today.  What matters to the patients health and wellness today?  "Overall I am doing okay; I am not falling as much as I was a couple of months ago; I think I need a new wheelchair, but the insurance people have told me that I don't qualify because my weight is under 300 lbs- I am going to ask Dr. Jenny Reichmann about this when I see him in November.  I have gotten the leg brace that the orthopedic doctor gave me but I am not wearing it much.... we got the vaccines for flu, pneumonia, and COVID recently and they both made [place] sick as a dog but we are better now"  Interventions provided; No further or ongoing care coordination needs identified today- provided with educational material via mychart around fall prevention   Goals Addressed             This Visit's Progress    Care Coordination Activities: No further follow up required   On track    Care Coordination Interventions: Evaluation of current treatment plan related to HTN/ general health maintenance and patient's adherence to plan as established by provider Advised patient to provide appropriate vaccination information to provider or CM team member at next visit Advised patient to update Dr. Jenny Reichmann on his recent falling at home, without injury- patient confirms that he has obtained leg brace from orthopedic provider, encouraged patient to consider discussing physical therapy referral with PCP, patient is not sure he would agree to do this, but will consider Provided education to patient re: fall prevention strategies: reports he continues using wheelchair primarily Provided patient with fall prevention educational materials related to fall prevention Reviewed scheduled/upcoming provider appointments including 02/11/22-  PCP Advised patient to discuss recent falls, possible need for new wheelchair (states he has discussed with insurance and they will not approve due to his weight being < 300 lbs) with provider Assessed social determinant of health barriers Confirmed patient obtained vaccines for RSV, COVID and flu approximately 11/24/21           SDOH assessments and interventions completed:  Yes  SDOH Interventions Today    Flowsheet Row Most Recent Value  SDOH Interventions   Food Insecurity Interventions Intervention Not Indicated  Transportation Interventions Intervention Not Indicated  [spouse provides transportation]       Care Coordination Interventions Activated:  Yes  Care Coordination Interventions:  Yes, provided   Follow up plan: No further intervention required.   Encounter Outcome:  Pt. Visit Completed   Oneta Rack, RN, BSN, CCRN Alumnus RN CM Care Coordination/ Transition of [place] Management [phone]: direct office

## 2022-02-01 ENCOUNTER — Telehealth: Payer: Self-pay | Admitting: Internal Medicine

## 2022-02-01 NOTE — Telephone Encounter (Signed)
LVM of rp to rtn my call to schedule AWV with [facility] call back # [phone]

## 2022-02-04 ENCOUNTER — Telehealth: Payer: Medicare Other

## 2022-02-08 ENCOUNTER — Telehealth: Payer: Self-pay | Admitting: Internal Medicine

## 2022-02-08 ENCOUNTER — Telehealth: Payer: Self-pay | Admitting: Orthopaedic Surgery

## 2022-02-08 NOTE — Telephone Encounter (Signed)
Pt called asking for a call from [facility]. or Dr Ninfa Linden. Pt would not reason for call back. Pt last visit was 4/21023 and pt did not make an appt. Pt is asking for a call back at [phone].

## 2022-02-08 NOTE — Telephone Encounter (Signed)
Pt returned call to schedule with the health advisor for the AWV. Gave pt the direct phone number [phone]  to call and schedule.

## 2022-02-08 NOTE — Telephone Encounter (Signed)
Patient scheduled tomorrow with Dr. Ninfa Linden

## 2022-02-08 NOTE — Telephone Encounter (Signed)
LVM for pt to rtn my call to schedule AWV with [facility] call back # [phone] 

## 2022-02-09 ENCOUNTER — Ambulatory Visit (INDEPENDENT_AMBULATORY_CARE_PROVIDER_SITE_OTHER): Payer: Medicare Other

## 2022-02-09 ENCOUNTER — Ambulatory Visit: Payer: Medicare Other | Admitting: Orthopaedic Surgery

## 2022-02-09 ENCOUNTER — Encounter: Payer: Self-pay | Admitting: Orthopaedic Surgery

## 2022-02-09 DIAGNOSIS — M25561 Pain in right knee: Secondary | ICD-10-CM | POA: Diagnosis not present

## 2022-02-09 DIAGNOSIS — Z89612 Acquired absence of left leg above knee: Secondary | ICD-10-CM | POA: Diagnosis not present

## 2022-02-09 DIAGNOSIS — S78112A Complete traumatic amputation at level between left hip and knee, initial encounter: Secondary | ICD-10-CM

## 2022-02-09 MED ORDER — LIDOCAINE HCL 1 % IJ SOLN
3.0000 mL | INTRAMUSCULAR | Status: AC | PRN
  Administered 2022-02-09: 3 mL

## 2022-02-09 MED ORDER — HYDROCODONE-ACETAMINOPHEN 5-325 MG PO TABS: 1.0000 | ORAL_TABLET | Freq: Three times a day (TID) | ORAL | 0 refills | Status: DC | PRN

## 2022-02-09 MED ORDER — METHYLPREDNISOLONE ACETATE 40 MG/ML IJ SUSP
40.0000 mg | INTRAMUSCULAR | Status: AC | PRN
  Administered 2022-02-09: 40 mg via INTRA_ARTICULAR

## 2022-02-09 NOTE — Progress Notes (Signed)
[name] comes in today with right knee pain and multiple falls as it relates to a previous left above-knee amputation.  He is in need of a better fitting and new prosthesis for his left AKA.  He is also requesting steroid injection in his right knee.  He has had multiple falls over the last few weeks.  He is 76 years old and tries to be active.  Is not diabetic.  His right knee does have pain globally but no decrease in motion and no effusion.  He feels like the knee gives out on him.  X-rays of the right knee show no acute findings.  There is mild arthritic changes.  I do feel that he would benefit from a new AKA prosthesis given his falls.  When he is at the prosthetic company, I am fine with them fitting him for any type of knee brace to help support his knee on the right knee.  I did place a steroid injection in his knee today without difficulty.  All questions and concerns were answered and addressed.  Follow-up is as needed.       Procedure Note  Patient: Steven Charles             Date of Birth: 08/18/1945           MRN: [id number]             Visit Date: 02/09/2022  Procedures: Visit Diagnoses:  1. Acute pain of right knee   2. Above knee amputation of left lower extremity (HCC)     Large Joint Inj: R knee on 02/09/2022 2:29 PM Indications: diagnostic evaluation and pain Details: 22 G 1.5 in needle, superolateral approach  Arthrogram: No  Medications: 3 mL lidocaine 1 %; 40 mg methylPREDNISolone acetate 40 MG/ML Outcome: tolerated well, no immediate complications Procedure, treatment alternatives, risks and benefits explained, specific risks discussed. Consent was given by the patient. Immediately prior to procedure a time out was called to verify the correct patient, procedure, equipment, support staff and site/side marked as required. Patient was prepped and draped in the usual sterile fashion.

## 2022-02-11 ENCOUNTER — Ambulatory Visit: Payer: Medicare Other | Admitting: Internal Medicine

## 2022-02-20 ENCOUNTER — Other Ambulatory Visit: Payer: Self-pay | Admitting: Internal Medicine

## 2022-02-20 NOTE — Telephone Encounter (Signed)
Please refill as per office routine med refill policy (all routine meds to be refilled for 3 mo or monthly (per pt preference) up to one year from last visit, then month to month grace period for 3 mo, then further med refills will have to be denied) ? ?

## 2022-04-04 ENCOUNTER — Telehealth: Payer: Self-pay | Admitting: Internal Medicine

## 2022-04-04 NOTE — Telephone Encounter (Signed)
Patient called requesting a refill on his medication atorvastatin (LIPITOR) 80 MG tablet . Patient stated that he only has 3 pills left. Patient would like the refill to be sent to the pharmacy on file  [place], [place] - [address]  . Best callback number is [phone].

## 2022-04-05 ENCOUNTER — Other Ambulatory Visit: Payer: Self-pay

## 2022-04-05 MED ORDER — ATORVASTATIN CALCIUM 80 MG PO TABS: ORAL_TABLET | ORAL | 1 refills | Status: DC

## 2022-04-05 NOTE — Telephone Encounter (Signed)
Refill request sent to pharmacy, unable to contact patient as number is not accepting calls

## 2022-04-21 DIAGNOSIS — H0520 Unspecified exophthalmos: Secondary | ICD-10-CM | POA: Diagnosis not present

## 2022-04-27 ENCOUNTER — Other Ambulatory Visit: Payer: Self-pay | Admitting: Internal Medicine

## 2022-05-13 ENCOUNTER — Telehealth: Payer: Self-pay

## 2022-05-13 NOTE — Patient Outreach (Signed)
  Care Coordination   05/13/2022 Name: IKENNA SAMRA MRN: [id number] DOB: Jul 11, 1945   Care Coordination Outreach Attempts:  An unsuccessful telephone outreach was attempted today to offer the patient information about available care coordination services as a benefit of their health plan.   Follow Up Plan:  Additional outreach attempts will be made to offer the patient care coordination information and services.   Encounter Outcome:  No Answer   Care Coordination Interventions:  No, not indicated    Thea Silversmith, RN, MSN, BSN, [place] Coordinator [phone]

## 2022-05-19 ENCOUNTER — Telehealth: Payer: Self-pay

## 2022-05-19 MED ORDER — LORAZEPAM 1 MG PO TABS: ORAL_TABLET | ORAL | 2 refills | Status: DC

## 2022-05-19 NOTE — Telephone Encounter (Signed)
Done erx 

## 2022-05-19 NOTE — Telephone Encounter (Signed)
Pt states he is needing a rx refill for his  LORazepam (ATIVAN) 1 MG tablet.  LOV: 10/25/2021

## 2022-05-19 NOTE — Addendum Note (Signed)
Addended by: Biagio Borg on: 05/19/2022 04:30 PM   Modules accepted: Orders

## 2022-06-15 ENCOUNTER — Telehealth: Payer: Self-pay

## 2022-06-15 NOTE — Telephone Encounter (Signed)
Called patient to schedule Medicare Annual Wellness Visit (AWV). Unable to reach patient.  Last date of AWV: 11/13/19  Please schedule an appointment at any time with [facility].    Norton Blizzard, [place] ([facility])  [place] [facility] [phone]

## 2022-06-16 ENCOUNTER — Telehealth: Payer: Self-pay

## 2022-06-16 NOTE — Patient Outreach (Signed)
  Care Coordination   06/16/2022 Name: Steven Charles MRN: [id number] DOB: 06-Jun-1945   Care Coordination Outreach Attempts:  A second unsuccessful outreach was attempted today to offer the patient with information about available care coordination services as a benefit of their health plan.     Follow Up Plan:  Additional outreach attempts will be made to offer the patient care coordination information and services.   Encounter Outcome:  No Answer   Care Coordination Interventions:  No, not indicated    Thea Silversmith, RN, MSN, BSN, [place] Coordinator [phone]

## 2022-06-23 ENCOUNTER — Other Ambulatory Visit: Payer: Self-pay | Admitting: Orthopaedic Surgery

## 2022-06-23 ENCOUNTER — Telehealth: Payer: Self-pay | Admitting: Orthopaedic Surgery

## 2022-06-23 MED ORDER — HYDROCODONE-ACETAMINOPHEN 5-325 MG PO TABS: 1.0000 | ORAL_TABLET | Freq: Two times a day (BID) | ORAL | 0 refills | Status: DC | PRN

## 2022-06-23 NOTE — Telephone Encounter (Signed)
Patient called in requesting Hydrocodone refill please advise

## 2022-07-20 ENCOUNTER — Other Ambulatory Visit: Payer: Self-pay

## 2022-07-20 MED ORDER — GABAPENTIN 300 MG PO CAPS: 300.0000 mg | ORAL_CAPSULE | Freq: Four times a day (QID) | ORAL | 1 refills | Status: DC

## 2022-08-15 ENCOUNTER — Encounter: Payer: Self-pay | Admitting: Internal Medicine

## 2022-08-15 ENCOUNTER — Ambulatory Visit (INDEPENDENT_AMBULATORY_CARE_PROVIDER_SITE_OTHER): Payer: Medicare Other | Admitting: Internal Medicine

## 2022-08-15 VITALS — BP 146/92 | HR 64 | Temp 98.1°F | Ht 71.0 in | Wt 257.0 lb

## 2022-08-15 DIAGNOSIS — E538 Deficiency of other specified B group vitamins: Secondary | ICD-10-CM | POA: Diagnosis not present

## 2022-08-15 DIAGNOSIS — E7800 Pure hypercholesterolemia, unspecified: Secondary | ICD-10-CM | POA: Diagnosis not present

## 2022-08-15 DIAGNOSIS — E1165 Type 2 diabetes mellitus with hyperglycemia: Secondary | ICD-10-CM | POA: Diagnosis not present

## 2022-08-15 DIAGNOSIS — R300 Dysuria: Secondary | ICD-10-CM

## 2022-08-15 DIAGNOSIS — E559 Vitamin D deficiency, unspecified: Secondary | ICD-10-CM | POA: Diagnosis not present

## 2022-08-15 DIAGNOSIS — Z125 Encounter for screening for malignant neoplasm of prostate: Secondary | ICD-10-CM | POA: Diagnosis not present

## 2022-08-15 DIAGNOSIS — I10 Essential (primary) hypertension: Secondary | ICD-10-CM | POA: Diagnosis not present

## 2022-08-15 DIAGNOSIS — Z0001 Encounter for general adult medical examination with abnormal findings: Secondary | ICD-10-CM

## 2022-08-15 LAB — HEPATIC FUNCTION PANEL
ALT: 23 U/L (ref 0–53)
AST: 21 U/L (ref 0–37)
Albumin: 3.8 g/dL (ref 3.5–5.2)
Alkaline Phosphatase: 101 U/L (ref 39–117)
Bilirubin, Direct: 0.2 mg/dL (ref 0.0–0.3)
Total Bilirubin: 0.8 mg/dL (ref 0.2–1.2)
Total Protein: 6.4 g/dL (ref 6.0–8.3)

## 2022-08-15 LAB — CBC WITH DIFFERENTIAL/PLATELET
Basophils Absolute: 0.1 10*3/uL (ref 0.0–0.1)
Basophils Relative: 0.8 % (ref 0.0–3.0)
Eosinophils Absolute: 0.2 10*3/uL (ref 0.0–0.7)
Eosinophils Relative: 1.9 % (ref 0.0–5.0)
HCT: 41.9 % (ref 39.0–52.0)
Hemoglobin: 14.6 g/dL (ref 13.0–17.0)
Lymphocytes Relative: 16.7 % (ref 12.0–46.0)
Lymphs Abs: 1.4 10*3/uL (ref 0.7–4.0)
MCHC: 34.9 g/dL (ref 30.0–36.0)
MCV: 98.3 fl (ref 78.0–100.0)
Monocytes Absolute: 0.6 10*3/uL (ref 0.1–1.0)
Monocytes Relative: 6.7 % (ref 3.0–12.0)
Neutro Abs: 6.1 10*3/uL (ref 1.4–7.7)
Neutrophils Relative %: 73.9 % (ref 43.0–77.0)
Platelets: 123 10*3/uL — ABNORMAL LOW (ref 150.0–400.0)
RBC: 4.26 Mil/uL (ref 4.22–5.81)
RDW: 13.1 % (ref 11.5–15.5)
WBC: 8.3 10*3/uL (ref 4.0–10.5)

## 2022-08-15 LAB — HEMOGLOBIN A1C: Hgb A1c MFr Bld: 5.6 % (ref 4.6–6.5)

## 2022-08-15 LAB — URINALYSIS, ROUTINE W REFLEX MICROSCOPIC
Bilirubin Urine: NEGATIVE
Ketones, ur: NEGATIVE
Nitrite: NEGATIVE
Specific Gravity, Urine: 1.015 (ref 1.000–1.030)
Urine Glucose: NEGATIVE
Urobilinogen, UA: 1 (ref 0.0–1.0)
pH: 7 (ref 5.0–8.0)

## 2022-08-15 LAB — BASIC METABOLIC PANEL
BUN: 22 mg/dL (ref 6–23)
CO2: 28 mEq/L (ref 19–32)
Calcium: 10.3 mg/dL (ref 8.4–10.5)
Chloride: 105 mEq/L (ref 96–112)
Creatinine, Ser: 1.25 mg/dL (ref 0.40–1.50)
GFR: 54.97 mL/min — ABNORMAL LOW (ref >60.00)
Glucose, Bld: 99 mg/dL (ref 70–99)
Potassium: 4.4 mEq/L (ref 3.5–5.1)
Sodium: 140 mEq/L (ref 135–145)

## 2022-08-15 LAB — LIPID PANEL
Cholesterol: 122 mg/dL (ref 0–200)
HDL: 36.9 mg/dL — ABNORMAL LOW (ref >39.00)
LDL Cholesterol: 56 mg/dL (ref 0–99)
NonHDL: 85.15
Total CHOL/HDL Ratio: 3
Triglycerides: 144 mg/dL (ref 0.0–149.0)
VLDL: 28.8 mg/dL (ref 0.0–40.0)

## 2022-08-15 LAB — MICROALBUMIN / CREATININE URINE RATIO
Creatinine,U: 123.3 mg/dL
Microalb Creat Ratio: 5.7 mg/g (ref 0.0–30.0)
Microalb, Ur: 7.1 mg/dL — ABNORMAL HIGH (ref 0.0–1.9)

## 2022-08-15 NOTE — Assessment & Plan Note (Signed)
Age and sex appropriate education and counseling updated with regular exercise and diet Referrals for preventative services - none needed Immunizations addressed - for shingrix at pharmacy Smoking counseling  - none needed Evidence for depression or other mood disorder - none significant Most recent labs reviewed. I have personally reviewed and have noted: 1) the patient's medical and social history 2) The patient's current medications and supplements 3) The patient's height, weight, and BMI have been recorded in the chart  

## 2022-08-15 NOTE — Patient Instructions (Addendum)
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
## 2022-08-15 NOTE — Assessment & Plan Note (Signed)
BP Readings from Last 3 Encounters:  08/15/22 (!) 146/92  08/11/21 128/66  05/11/21 138/71   Uncontrolled today but pt is adamant controlled at home, pt to continue medical treatment coreg 6.25 bid, lisinopril 20 qd

## 2022-08-15 NOTE — Assessment & Plan Note (Signed)
Lab Results  Component Value Date   VITAMINB12 >1504 (H) 08/11/2021   Stable, cont oral replacement - b12 1000 mcg qd

## 2022-08-15 NOTE — Assessment & Plan Note (Signed)
Lab Results  Component Value Date   LDLCALC 56 08/11/2021   Stable, pt to continue current statin lipitor 80 mg qd, zetia 10 qd

## 2022-08-15 NOTE — Assessment & Plan Note (Signed)
Mild to mod, for Ua and culture,  to f/u any worsening symptoms or concerns

## 2022-08-15 NOTE — Assessment & Plan Note (Signed)
Lab Results  Component Value Date   HGBA1C 5.5 08/11/2021   Stable, pt to continue current medical treatment  - diet, wt control

## 2022-08-16 ENCOUNTER — Other Ambulatory Visit: Payer: Self-pay | Admitting: Internal Medicine

## 2022-08-16 LAB — TSH: TSH: 5.63 u[IU]/mL — ABNORMAL HIGH (ref 0.35–5.50)

## 2022-08-16 LAB — PSA: PSA: 0.2 ng/mL (ref 0.10–4.00)

## 2022-08-16 LAB — VITAMIN B12: Vitamin B-12: 673 pg/mL (ref 211–911)

## 2022-08-16 LAB — VITAMIN D 25 HYDROXY (VIT D DEFICIENCY, FRACTURES): VITD: 45.1 ng/mL (ref 30.00–100.00)

## 2022-08-16 MED ORDER — CIPROFLOXACIN HCL 500 MG PO TABS: 500.0000 mg | ORAL_TABLET | Freq: Two times a day (BID) | ORAL | 0 refills | Status: AC

## 2022-08-18 LAB — URINE CULTURE

## 2022-09-02 ENCOUNTER — Other Ambulatory Visit: Payer: Self-pay | Admitting: Internal Medicine

## 2022-10-04 ENCOUNTER — Other Ambulatory Visit: Payer: Self-pay | Admitting: Internal Medicine

## 2022-10-17 ENCOUNTER — Telehealth: Payer: Self-pay | Admitting: Internal Medicine

## 2022-10-17 MED ORDER — FUROSEMIDE 20 MG PO TABS: 20.0000 mg | ORAL_TABLET | Freq: Two times a day (BID) | ORAL | 0 refills | Status: DC | PRN

## 2022-10-17 MED ORDER — BUPROPION HCL ER (XL) 300 MG PO TB24: ORAL_TABLET | ORAL | 1 refills | Status: DC

## 2022-10-17 MED ORDER — GABAPENTIN 300 MG PO CAPS: 300.0000 mg | ORAL_CAPSULE | Freq: Four times a day (QID) | ORAL | 1 refills | Status: DC

## 2022-10-17 NOTE — Telephone Encounter (Signed)
Sent refills to [facility]...Johny Chess

## 2022-10-17 NOTE — Telephone Encounter (Signed)
Prescription Request  10/17/2022  LOV: 08/15/2022  What is the name of the medication or equipment? buPROPion (WELLBUTRIN XL) 300 MG 24 hr tablet  gabapentin (NEURONTIN) 300 MG capsule  furosemide (LASIX) 20 MG tablet   Have you contacted your pharmacy to request a refill? No   Which pharmacy would you like this sent to?  [id number] - [place], [place] - [address] [place] [address] Phone: [phone] Fax: [phone]    Patient notified that their request is being sent to the clinical staff for review and that they should receive a response within 2 business days.   Please advise at [facility] [phone]

## 2022-10-18 ENCOUNTER — Emergency Department (HOSPITAL_COMMUNITY): Payer: Medicare Other

## 2022-10-18 ENCOUNTER — Other Ambulatory Visit: Payer: Self-pay

## 2022-10-18 ENCOUNTER — Encounter (HOSPITAL_COMMUNITY): Payer: Self-pay | Admitting: Emergency Medicine

## 2022-10-18 ENCOUNTER — Emergency Department (HOSPITAL_COMMUNITY)
Admission: EM | Admit: 2022-10-18 | Discharge: 2022-10-18 | Disposition: A | Discharge status: Home / Self Care | Payer: Medicare Other | Attending: Emergency Medicine | Admitting: Emergency Medicine

## 2022-10-18 DIAGNOSIS — W19XXXA Unspecified fall, initial encounter: Secondary | ICD-10-CM | POA: Diagnosis not present

## 2022-10-18 DIAGNOSIS — I2510 Atherosclerotic heart disease of native coronary artery without angina pectoris: Secondary | ICD-10-CM | POA: Insufficient documentation

## 2022-10-18 DIAGNOSIS — M25552 Pain in left hip: Secondary | ICD-10-CM | POA: Diagnosis not present

## 2022-10-18 DIAGNOSIS — I509 Heart failure, unspecified: Secondary | ICD-10-CM | POA: Diagnosis not present

## 2022-10-18 DIAGNOSIS — Z9889 Other specified postprocedural states: Secondary | ICD-10-CM | POA: Diagnosis not present

## 2022-10-18 DIAGNOSIS — Z79899 Other long term (current) drug therapy: Secondary | ICD-10-CM | POA: Diagnosis not present

## 2022-10-18 DIAGNOSIS — I10 Essential (primary) hypertension: Secondary | ICD-10-CM | POA: Diagnosis not present

## 2022-10-18 DIAGNOSIS — Z7982 Long term (current) use of aspirin: Secondary | ICD-10-CM | POA: Insufficient documentation

## 2022-10-18 DIAGNOSIS — Z743 Need for continuous supervision: Secondary | ICD-10-CM | POA: Diagnosis not present

## 2022-10-18 DIAGNOSIS — S79919A Unspecified injury of unspecified hip, initial encounter: Secondary | ICD-10-CM | POA: Diagnosis not present

## 2022-10-18 DIAGNOSIS — R6889 Other general symptoms and signs: Secondary | ICD-10-CM | POA: Diagnosis not present

## 2022-10-18 MED ORDER — HYDROCODONE-ACETAMINOPHEN 5-325 MG PO TABS
1.0000 | ORAL_TABLET | Freq: Once | ORAL | Status: AC
  Administered 2022-10-18: 1 via ORAL
  Filled 2022-10-18: qty 1

## 2022-10-18 NOTE — ED Provider Notes (Signed)
[place] EMERGENCY DEPARTMENT AT [facility] Provider Note   CSN: [id number] Arrival date & time: 10/18/22  1641     History  Chief Complaint  Patient presents with   Fall   HPI Steven Charles is a 77 y.o. male with CAD, CHF, AAA left leg amputee presenting for fall.  States this occurred around 2:30 PM this afternoon.  Patient was leaning over in his wheelchair to pick something up when he fell over and landed on his left side at the distal end of the stump and left hip.  Head injury or loss of consciousness.  Denies abdominal pain chest pain.   Fall       Home Medications Prior to Admission medications   Medication Sig Start Date End Date Taking? Authorizing Provider  Ascorbic Acid (VITAMIN C) 500 MG tablet Take 500 mg by mouth 2 (two) times daily.    [provider]  aspirin 81 MG tablet Take 1 tablet (81 mg total) by mouth daily. 04/19/19   Lewayne Bunting, MD  atorvastatin (LIPITOR) 80 MG tablet TAKE 1 TABLET(80 MG) BY MOUTH DAILY 10/04/22   Corwin Levins, MD  blood glucose meter kit and supplies KIT Dispense based on patient and insurance preference. Use up to four times daily as directed. (E11.9) 06/06/17   Corwin Levins, MD  Blood Glucose Monitoring Suppl (ONE TOUCH ULTRA 2) w/Device KIT Use the blood sugar meter to monitor your blood sugar 1-4 times per day as instructed. 06/08/17   Corwin Levins, MD  buPROPion (WELLBUTRIN XL) 300 MG 24 hr tablet TAKE 1 TABLET(300 MG) BY MOUTH DAILY 10/17/22   Corwin Levins, MD  carvedilol (COREG) 6.25 MG tablet Take 1 tablet (6.25 mg total) by mouth 2 (two) times daily. 09/10/21   Corwin Levins, MD  Cyanocobalamin (VITAMIN B12 PO) Take by mouth.    [provider]  docusate sodium (COLACE) 100 MG capsule Take 100 mg by mouth at bedtime.    [provider]  ezetimibe (ZETIA) 10 MG tablet Take 1 tablet (10 mg total) by mouth daily. 10/04/21   Corwin Levins, MD  fexofenadine (ALLEGRA) 180 MG tablet Take 180  mg by mouth at bedtime.    [provider]  FLUoxetine (PROZAC) 40 MG capsule Take 1 capsule (40 mg total) by mouth daily. 09/10/21   Corwin Levins, MD  furosemide (LASIX) 20 MG tablet Take 1 tablet (20 mg total) by mouth 2 (two) times daily as needed for fluid (retensin). 10/17/22   Corwin Levins, MD  gabapentin (NEURONTIN) 300 MG capsule Take 1 capsule (300 mg total) by mouth 4 (four) times daily. 10/17/22   Corwin Levins, MD  glucose blood (ONE TOUCH ULTRA TEST) test strip Use to chec blood sugars four times a day Dx E11.65 06/08/17   Corwin Levins, MD  HYDROcodone-acetaminophen (NORCO/VICODIN) 5-325 MG tablet Take 1-2 tablets by mouth 2 (two) times daily as needed for moderate pain. 06/23/22   Kathryne Hitch, MD  ibuprofen (ADVIL,MOTRIN) 400 MG tablet Take 400 mg by mouth daily as needed for headache or moderate pain.    [provider]  Lancets Letta Pate ULTRASOFT) lancets Use to help check blood sugar four times a day Dx E11.65 06/08/17   Corwin Levins, MD  lidocaine (LIDODERM) 5 % Place 1 patch onto the skin daily. Remove & Discard patch within 12 hours or as directed by MD 09/11/17   Corwin Levins,  MD  lisinopril (ZESTRIL) 20 MG tablet Take 1 tablet (20 mg total) by mouth daily. 09/10/21   Corwin Levins, MD  LORazepam (ATIVAN) 1 MG tablet TAKE 1 TABLET(1 MG) BY MOUTH TWICE DAILY AS NEEDED 05/19/22   Corwin Levins, MD  Multiple Vitamin (MULTIVITAMIN) tablet Take 1 tablet by mouth daily.    [provider]  nystatin (MYCOSTATIN/NYSTOP) powder Use as directed twice per day as needed 04/09/21   Corwin Levins, MD  Omega-3 Fatty Acids (FISH OIL) 1000 MG CAPS Take 1,000 mg by mouth 2 (two) times daily.     [provider]  pantoprazole (PROTONIX) 40 MG tablet Take 1 tablet (40 mg total) by mouth 2 (two) times daily. 09/10/21   Corwin Levins, MD  Polyethyl Glycol-Propyl Glycol 0.4-0.3 % SOLN Place 1 drop into both eyes daily as needed (for dry eyes). Reported on  10/07/2015    [provider]  potassium chloride SA (KLOR-CON) 20 MEQ tablet TAKE 1 TABLET(20 MEQ) BY MOUTH DAILY 06/18/20   Corwin Levins, MD  Pyridoxine HCl (VITAMIN B6 PO) Take by mouth.    [provider]  tamsulosin (FLOMAX) 0.4 MG CAPS capsule Take 1 capsule (0.4 mg total) by mouth 2 (two) times daily. 08/11/21   Corwin Levins, MD  vitamin E 1000 UNIT capsule Take 1,000 Units by mouth daily.    [provider]      Allergies    Nicoderm [nicotine] and Adhesive [tape]    Review of Systems   See HPI for pertinent positives  Physical Exam Updated Vital Signs BP (!) 155/60 (BP Location: Right Arm)   Pulse 68   Temp 98.1 F (36.7 C) (Oral)   Resp 15   Ht 5\' 11"  (1.803 m)   Wt 117 kg   SpO2 98%   BMI 35.98 kg/m  Physical Exam Constitutional:      Appearance: Normal appearance.  HENT:     Head: Normocephalic.     Nose: Nose normal.  Eyes:     Conjunctiva/sclera: Conjunctivae normal.  Pulmonary:     Effort: Pulmonary effort is normal.  Musculoskeletal:     Right hip: Normal.     Left hip: Tenderness present. No deformity. Normal range of motion.     Comments: Lower left amputation noted.  Tenderness noted about the lateral aspect of the hip.  No abrasion, ecchymosis noted.  Neurological:     Mental Status: He is alert.  Psychiatric:        Mood and Affect: Mood normal.     ED Results / Procedures / Treatments   Labs (all labs ordered are listed, but only abnormal results are displayed) Labs Reviewed - No data to display  EKG None  Radiology DG Hip Unilat With Pelvis 2-3 Views Left  Result Date: 10/18/2022 CLINICAL DATA:  Recent fall with left hip pain, initial encounter EXAM: DG HIP (WITH OR WITHOUT PELVIS) 2-3V LEFT COMPARISON:  07/12/2021 FINDINGS: Pelvic ring is intact. Postsurgical changes in the proximal left femur are noted. No loosening or acute fracture is seen. No soft tissue changes are noted. IMPRESSION: Postsurgical changes  in the proximal left femur. No acute abnormality noted. Electronically Signed   By: Alcide Clever M.D.   On: 10/18/2022 18:48    Procedures Procedures    Medications Ordered in ED Medications  HYDROcodone-acetaminophen (NORCO/VICODIN) 5-325 MG per tablet 1 tablet (has no administration in time range)    ED Course/ Medical Decision Making/ A&P  Medical Decision Making Amount and/or Complexity of Data Reviewed Radiology: ordered.  Risk Prescription drug management.   77 year old well-appearing male presenting for mechanical fall.  Exam notable for tenderness about the left hip joint.  X-ray was negative.  No suspicion for infection at this time. Treated his pain with Norco.  On reassessment, patient stated it was better.  Advised him to follow-up with his PCP.  Recommended conservative treatment at home.  Discussed pertinent return precautions.  Vital stable.  Discharged home in good condition.        Final Clinical Impression(s) / ED Diagnoses Final diagnoses:  Pain of left hip    Rx / DC Orders ED Discharge Orders     None         Gareth Eagle, PA-C 10/18/22 1904    Benjiman Core, MD 10/18/22 2325

## 2022-10-18 NOTE — Discharge Instructions (Signed)
Evaluation was reassuring.  X-ray was negative for acute changes related to your recent fall.  Recommend conservative treatment at home and follow-up with your PCP.

## 2022-10-18 NOTE — ED Triage Notes (Signed)
Pt fell out of wheelchair bending down to pick up cell phone. Pt c/o left hip pain after hitting stump. Pt is left AKA.

## 2022-10-21 ENCOUNTER — Encounter: Payer: Self-pay | Admitting: *Deleted

## 2022-10-21 ENCOUNTER — Ambulatory Visit: Payer: Self-pay

## 2022-10-21 ENCOUNTER — Telehealth: Payer: Self-pay | Admitting: *Deleted

## 2022-10-21 NOTE — Transitions of Care (Post Inpatient/ED Visit) (Signed)
   10/21/2022  Name: Steven Charles MRN: [id number] DOB: 1945-11-01  Today's TOC FU Call Status: Today's TOC FU Call Status:: Unsuccessul Call (1st Attempt) Unsuccessful Call (1st Attempt) Date: 10/21/22  ED EMMI Red Alert notification on 10/21/22 from ED visit 10/18/22- automated EMMI call placed 10/20/22: "No scheduled follow up"  Attempted to reach the patient regarding the most recent ED visit; Received automated outgoing voice message stating that "the number you are calling is being blocked by call-blocker; this call is not being accepted, please hang up;" unable to leave voice message requesting call back   Follow Up Plan: Additional outreach attempts will be made to reach the patient to complete the Transitions of Care (Post ED visit) call.   Caryl Pina, RN, BSN, CCRN Alumnus RN CM Care Coordination/ Transition of Care- [facility] Care Management [phone]: direct office

## 2022-10-21 NOTE — Chronic Care Management (AMB) (Signed)
   10/21/2022  ZINEDINE MAJID [birth year] [id number]   Reason for Encounter: Patient is not currently enrolled in the CCM program. CCM status changed to previously enrolled  Alto Denver RN, MSN, CCM RN Care Manager  Chronic Care Management Direct Number: [phone]

## 2022-10-24 ENCOUNTER — Telehealth: Payer: Self-pay | Admitting: *Deleted

## 2022-10-24 ENCOUNTER — Encounter: Payer: Self-pay | Admitting: *Deleted

## 2022-10-24 NOTE — Transitions of Care (Post Inpatient/ED Visit) (Signed)
   10/24/2022  Name: Steven Charles MRN: [id number] DOB: 03-11-1946  Today's TOC FU Call Status: Today's TOC FU Call Status:: Unsuccessful Call (2nd Attempt) Unsuccessful Call (2nd Attempt) Date: 10/24/22  ED EMMI Red Alert notification on 10/21/22 from ED visit 10/18/22- automated EMMI call placed 10/20/22: "No scheduled follow up"  Attempted to reach the patient regarding the most recent ED visit; Received automated outgoing voice message stating, "calls to this number are being blocked by callscreener, your call is not being accepted. Please hang up now;" no option to leave voice message requesting call back   Follow Up Plan: Additional outreach attempts will be made to reach the patient to complete the Transitions of Care (Post ED visit) call.   Caryl Pina, RN, BSN, CCRN Alumnus RN CM Care Coordination/ Transition of Care- [facility] Care Management [phone]: direct office

## 2022-10-25 ENCOUNTER — Encounter: Payer: Self-pay | Admitting: *Deleted

## 2022-10-25 ENCOUNTER — Telehealth: Payer: Self-pay | Admitting: *Deleted

## 2022-10-25 NOTE — Transitions of Care (Post Inpatient/ED Visit) (Signed)
   10/25/2022  Name: Steven Charles MRN: [id number] DOB: Jul 20, 1945  Today's TOC FU Call Status: Today's TOC FU Call Status:: Unsuccessful Call (3rd Attempt)  ED EMMI Red Alert notification on 10/21/22 from ED visit 10/18/22- automated EMMI call placed 10/20/22: "No scheduled follow up"   Attempted to reach the patient regarding the most recent ED visit; Received automated outgoing voice message stating, "the number you have called is being screened by smart call blocker; the number is not accepting your call;" unable to leave voice message requesting call back   Follow Up Plan: No further outreach attempts will be made at this time. We have been unable to contact the patient.  Caryl Pina, RN, BSN, CCRN Alumnus RN CM Care Coordination/ Transition of Care- [facility] Care Management [phone]: direct office

## 2022-10-27 ENCOUNTER — Other Ambulatory Visit: Payer: Self-pay | Admitting: Radiology

## 2022-10-27 ENCOUNTER — Other Ambulatory Visit: Payer: Self-pay

## 2022-10-27 MED ORDER — EZETIMIBE 10 MG PO TABS: 10.0000 mg | ORAL_TABLET | Freq: Every day | ORAL | 3 refills | Status: DC

## 2022-10-27 NOTE — Telephone Encounter (Signed)
Done

## 2022-10-27 NOTE — Telephone Encounter (Signed)
Prescription Request  10/27/2022  LOV: 08/15/2022  What is the name of the medication or equipment? ezetimide  Have you contacted your pharmacy to request a refill? Yes   Which pharmacy would you like this sent to?  [id number] - [place], [place] - [address] [place] [address] Phone: [phone] Fax: [phone]    Patient notified that their request is being sent to the clinical staff for review and that they should receive a response within 2 business days.   Please advise at Mobile [phone] (mobile)

## 2022-11-14 NOTE — Progress Notes (Deleted)
Cardiology Office Note   Date:  11/14/2022  ID:  Steven Charles, DOB 1946-01-09, MRN [id number] PCP:  Corwin Levins, MD [facility] Cardiologist: Olga Millers, MD  Reason for visit: 1 year follow-up  History of Present Illness    Steven Charles is a 77 y.o. male with a hx of CAD status post CABG December 2007. Patient is limited by his mobility status post right above-the-knee amputation.    I last saw him in August 2022, with witnessed apneic episodes and snoring, sleep study ordered.  He followed up with Dr. Jens Som in February 2023.  Sleep study had not been completed.  Weight loss recommended.  Today, ***  Coronary artery disease -Post CABG 02/2006 -***  Chronic diastolic heart failure Lower extremity edema -***  Hypertension -*** -Goal BP is <130/80.  Recommend DASH diet (high in vegetables, fruits, low-fat dairy products, whole grains, poultry, fish, and nuts and low in sweets, sugar-sweetened beverages, and red meats), salt restriction and increase physical activity.  Hyperlipidemia -*** -Discussed cholesterol lowering diets - Mediterranean diet, DASH diet, vegetarian diet, low-carbohydrate diet and avoidance of trans fats.  Discussed healthier choice substitutes.  Nuts, high-fiber foods, and fiber supplements may also improve lipids.    Obesity -Discussed how even a 5-10% weight loss can have cardiovascular benefits.   -Recommend moderate intensity activity for 30 minutes 5 days/week and the DASH diet.  Tobacco use  -Recommend tobacco cessation.  Reviewed physiologic effects of nicotine and the immediate-eventual benefits of quitting including improvement in cough/breathing and reduction in cardiovascular events.  Discussed quitting tips such as removing triggers and getting support from family/friends and Quitline [place]. -USPSTF recommends one-time screening for abdominal aortic aneurysm (AAA) by ultrasound in men 50 -51 years old who have ever smoked.       Disposition - Follow-up in ***     Objective / Physical Exam   EKG today: ***  Vital signs:  There were no vitals taken for this visit.    GEN: No acute distress NECK: No carotid bruits CARDIAC: ***RRR, no murmurs RESPIRATORY:  Clear to auscultation without rales, wheezing or rhonchi  EXTREMITIES: No edema  Assessment and Plan   ***   {Are you ordering a CV Procedure (e.g. stress test, cath, DCCV, TEE, etc)?   Press F2        :[id number]}    Signed, Bernette Mayers  11/14/2022 [facility]

## 2022-11-16 ENCOUNTER — Ambulatory Visit: Payer: Medicare Other | Admitting: Physician Assistant

## 2022-11-16 DIAGNOSIS — I25810 Atherosclerosis of coronary artery bypass graft(s) without angina pectoris: Secondary | ICD-10-CM

## 2022-11-16 DIAGNOSIS — E785 Hyperlipidemia, unspecified: Secondary | ICD-10-CM

## 2022-11-16 DIAGNOSIS — I10 Essential (primary) hypertension: Secondary | ICD-10-CM

## 2022-11-16 DIAGNOSIS — I5030 Unspecified diastolic (congestive) heart failure: Secondary | ICD-10-CM

## 2022-12-02 ENCOUNTER — Telehealth: Payer: Self-pay

## 2022-12-02 MED ORDER — LORAZEPAM 1 MG PO TABS: ORAL_TABLET | ORAL | 2 refills | Status: DC

## 2022-12-02 NOTE — Telephone Encounter (Signed)
Done erx 

## 2022-12-19 ENCOUNTER — Other Ambulatory Visit: Payer: Self-pay

## 2022-12-19 ENCOUNTER — Telehealth: Payer: Self-pay | Admitting: Internal Medicine

## 2022-12-19 MED ORDER — LISINOPRIL 20 MG PO TABS: 20.0000 mg | ORAL_TABLET | Freq: Every day | ORAL | 3 refills | Status: DC

## 2022-12-19 MED ORDER — FLUOXETINE HCL 40 MG PO CAPS: 40.0000 mg | ORAL_CAPSULE | Freq: Every day | ORAL | 3 refills | Status: DC

## 2022-12-19 MED ORDER — CARVEDILOL 6.25 MG PO TABS: 6.2500 mg | ORAL_TABLET | Freq: Two times a day (BID) | ORAL | 3 refills | Status: DC

## 2022-12-19 MED ORDER — PANTOPRAZOLE SODIUM 40 MG PO TBEC: 40.0000 mg | DELAYED_RELEASE_TABLET | Freq: Two times a day (BID) | ORAL | 3 refills | Status: DC

## 2022-12-19 NOTE — Telephone Encounter (Signed)
Prescription Request  12/19/2022  LOV: 08/15/2022  What is the name of the medication or equipment? buPROPion (WELLBUTRIN XL) 300 MG 24 hr tablet  gabapentin (NEURONTIN) 300 MG capsule  FLUoxetine (PROZAC) 40 MG capsule lisinopril (ZESTRIL) 20 MG tablet  pantoprazole (PROTONIX) 40 MG tablet  carvedilol (COREG) 6.25 MG tablet   Have you contacted your pharmacy to request a refill? No   Which pharmacy would you like this sent to?  [id number] - [place], [place] - [address] [place] [address] Phone: [phone] Fax: [phone]    Patient notified that their request is being sent to the clinical staff for review and that they should receive a response within 2 business days.   Please advise at [facility] [phone]

## 2022-12-19 NOTE — Telephone Encounter (Signed)
Refill Sent.

## 2022-12-23 ENCOUNTER — Ambulatory Visit: Payer: Medicare Other | Admitting: Nurse Practitioner
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
## 2023-01-12 ENCOUNTER — Ambulatory Visit: Payer: Medicare Other | Attending: Physician Assistant | Admitting: Nurse Practitioner

## 2023-01-12 ENCOUNTER — Encounter: Payer: Self-pay | Admitting: Nurse Practitioner

## 2023-01-12 VITALS — BP 130/78 | HR 63 | Ht 71.0 in | Wt 279.0 lb

## 2023-01-12 DIAGNOSIS — I6523 Occlusion and stenosis of bilateral carotid arteries: Secondary | ICD-10-CM

## 2023-01-12 DIAGNOSIS — I10 Essential (primary) hypertension: Secondary | ICD-10-CM

## 2023-01-12 DIAGNOSIS — E785 Hyperlipidemia, unspecified: Secondary | ICD-10-CM

## 2023-01-12 DIAGNOSIS — I2510 Atherosclerotic heart disease of native coronary artery without angina pectoris: Secondary | ICD-10-CM

## 2023-01-12 DIAGNOSIS — R600 Localized edema: Secondary | ICD-10-CM

## 2023-01-12 NOTE — Patient Instructions (Signed)
Medication Instructions:  Your physician recommends that you continue on your current medications as directed. Please refer to the Current Medication list given to you today.  *If you need a refill on your cardiac medications before your next appointment, please call your pharmacy*   Lab Work: NONE ordered at this time of appointment    Testing/Procedures: NONE ordered at this time of appointment     Follow-Up: At [facility], you and your health needs are our priority.  As part of our continuing mission to provide you with exceptional heart care, we have created designated Provider Care Teams.  These Care Teams include your primary Cardiologist (physician) and Advanced Practice Providers (APPs -  Physician Assistants and Nurse Practitioners) who all work together to provide you with the care you need, when you need it.  We recommend signing up for the patient portal called "MyChart".  Sign up information is provided on this After Visit Summary.  MyChart is used to connect with patients for Virtual Visits (Telemedicine).  Patients are able to view lab/test results, encounter notes, upcoming appointments, etc.  Non-urgent messages can be sent to your provider as well.   To learn more about what you can do with MyChart, go to [email or web address].    Your next appointment:   6 month(s)  Provider:   Olga Millers, MD

## 2023-01-14 ENCOUNTER — Other Ambulatory Visit: Payer: Self-pay | Admitting: Internal Medicine

## 2023-02-03 ENCOUNTER — Other Ambulatory Visit: Payer: Self-pay | Admitting: Internal Medicine

## 2023-02-13 ENCOUNTER — Telehealth: Payer: Self-pay | Admitting: Internal Medicine

## 2023-02-13 NOTE — Telephone Encounter (Signed)
Prescription Request  02/13/2023  LOV: 08/15/2022  What is the name of the medication or equipment? buPROPion (WELLBUTRIN XL) 300 MG 24 hr tablet   Have you contacted your pharmacy to request a refill? No   Which pharmacy would you like this sent to?  [id number] - [place], [place] - [address] [place] [address] Phone: [phone] Fax: [phone]    Patient notified that their request is being sent to the clinical staff for review and that they should receive a response within 2 business days.   Please advise at Mobile [phone] (mobile)

## 2023-02-13 NOTE — Telephone Encounter (Signed)
Refill too soon , Pt needs to contact pharmacy.

## 2023-02-17 ENCOUNTER — Other Ambulatory Visit: Payer: Self-pay | Admitting: Internal Medicine

## 2023-02-17 NOTE — Telephone Encounter (Signed)
Please send in for patient

## 2023-02-17 NOTE — Telephone Encounter (Signed)
Pharmacy should have another refill.

## 2023-03-02 ENCOUNTER — Emergency Department (HOSPITAL_COMMUNITY): Payer: Medicare Other

## 2023-03-02 ENCOUNTER — Ambulatory Visit: Payer: Medicare Other | Admitting: Physician Assistant

## 2023-03-02 ENCOUNTER — Emergency Department (HOSPITAL_COMMUNITY)
Admission: EM | Admit: 2023-03-02 | Discharge: 2023-03-02 | Disposition: A | Discharge status: Home / Self Care | Payer: Medicare Other | Attending: Emergency Medicine | Admitting: Emergency Medicine

## 2023-03-02 ENCOUNTER — Other Ambulatory Visit: Payer: Self-pay

## 2023-03-02 ENCOUNTER — Encounter (HOSPITAL_COMMUNITY): Payer: Self-pay

## 2023-03-02 DIAGNOSIS — W19XXXA Unspecified fall, initial encounter: Secondary | ICD-10-CM | POA: Diagnosis not present

## 2023-03-02 DIAGNOSIS — K118 Other diseases of salivary glands: Secondary | ICD-10-CM | POA: Insufficient documentation

## 2023-03-02 DIAGNOSIS — Z7982 Long term (current) use of aspirin: Secondary | ICD-10-CM | POA: Insufficient documentation

## 2023-03-02 DIAGNOSIS — L03115 Cellulitis of right lower limb: Secondary | ICD-10-CM | POA: Diagnosis not present

## 2023-03-02 DIAGNOSIS — I509 Heart failure, unspecified: Secondary | ICD-10-CM | POA: Insufficient documentation

## 2023-03-02 DIAGNOSIS — S5002XA Contusion of left elbow, initial encounter: Secondary | ICD-10-CM | POA: Diagnosis not present

## 2023-03-02 DIAGNOSIS — R9082 White matter disease, unspecified: Secondary | ICD-10-CM | POA: Insufficient documentation

## 2023-03-02 DIAGNOSIS — M79604 Pain in right leg: Secondary | ICD-10-CM | POA: Diagnosis not present

## 2023-03-02 DIAGNOSIS — S3994XA Unspecified injury of external genitals, initial encounter: Secondary | ICD-10-CM | POA: Diagnosis not present

## 2023-03-02 DIAGNOSIS — Z743 Need for continuous supervision: Secondary | ICD-10-CM | POA: Diagnosis not present

## 2023-03-02 DIAGNOSIS — S0990XA Unspecified injury of head, initial encounter: Secondary | ICD-10-CM | POA: Diagnosis not present

## 2023-03-02 DIAGNOSIS — E119 Type 2 diabetes mellitus without complications: Secondary | ICD-10-CM | POA: Diagnosis not present

## 2023-03-02 DIAGNOSIS — M25522 Pain in left elbow: Secondary | ICD-10-CM | POA: Diagnosis not present

## 2023-03-02 DIAGNOSIS — M19042 Primary osteoarthritis, left hand: Secondary | ICD-10-CM | POA: Diagnosis not present

## 2023-03-02 DIAGNOSIS — I110 Hypertensive heart disease with heart failure: Secondary | ICD-10-CM | POA: Diagnosis not present

## 2023-03-02 DIAGNOSIS — M19022 Primary osteoarthritis, left elbow: Secondary | ICD-10-CM | POA: Diagnosis not present

## 2023-03-02 DIAGNOSIS — Z79899 Other long term (current) drug therapy: Secondary | ICD-10-CM | POA: Insufficient documentation

## 2023-03-02 DIAGNOSIS — S6992XA Unspecified injury of left wrist, hand and finger(s), initial encounter: Secondary | ICD-10-CM | POA: Insufficient documentation

## 2023-03-02 DIAGNOSIS — I10 Essential (primary) hypertension: Secondary | ICD-10-CM | POA: Diagnosis not present

## 2023-03-02 DIAGNOSIS — R58 Hemorrhage, not elsewhere classified: Secondary | ICD-10-CM | POA: Diagnosis not present

## 2023-03-02 DIAGNOSIS — R609 Edema, unspecified: Secondary | ICD-10-CM | POA: Diagnosis not present

## 2023-03-02 DIAGNOSIS — R6889 Other general symptoms and signs: Secondary | ICD-10-CM | POA: Diagnosis not present

## 2023-03-02 LAB — CBC WITH DIFFERENTIAL/PLATELET
Abs Immature Granulocytes: 0.03 10*3/uL (ref 0.00–0.07)
Basophils Absolute: 0 10*3/uL (ref 0.0–0.1)
Basophils Relative: 0 %
Eosinophils Absolute: 0.3 10*3/uL (ref 0.0–0.5)
Eosinophils Relative: 4 %
HCT: 40.2 % (ref 39.0–52.0)
Hemoglobin: 13.4 g/dL (ref 13.0–17.0)
Immature Granulocytes: 0 %
Lymphocytes Relative: 18 %
Lymphs Abs: 1.3 10*3/uL (ref 0.7–4.0)
MCH: 33.5 pg (ref 26.0–34.0)
MCHC: 33.3 g/dL (ref 30.0–36.0)
MCV: 100.5 fL — ABNORMAL HIGH (ref 80.0–100.0)
Monocytes Absolute: 0.7 10*3/uL (ref 0.1–1.0)
Monocytes Relative: 10 %
Neutro Abs: 4.7 10*3/uL (ref 1.7–7.7)
Neutrophils Relative %: 68 %
Platelets: 149 10*3/uL — ABNORMAL LOW (ref 150–400)
RBC: 4 MIL/uL — ABNORMAL LOW (ref 4.22–5.81)
RDW: 12.7 % (ref 11.5–15.5)
WBC: 7 10*3/uL (ref 4.0–10.5)
nRBC: 0 % (ref 0.0–0.2)

## 2023-03-02 LAB — BASIC METABOLIC PANEL
Anion gap: 7 (ref 5–15)
BUN: 19 mg/dL (ref 8–23)
CO2: 27 mmol/L (ref 22–32)
Calcium: 9.4 mg/dL (ref 8.9–10.3)
Chloride: 106 mmol/L (ref 98–111)
Creatinine, Ser: 1.24 mg/dL (ref 0.61–1.24)
GFR, Estimated: 59 mL/min — ABNORMAL LOW (ref >60)
Glucose, Bld: 107 mg/dL — ABNORMAL HIGH (ref 70–99)
Potassium: 4 mmol/L (ref 3.5–5.1)
Sodium: 140 mmol/L (ref 135–145)

## 2023-03-02 LAB — LACTIC ACID, PLASMA
Lactic Acid, Venous: 1 mmol/L (ref 0.5–1.9)
Lactic Acid, Venous: 1.3 mmol/L (ref 0.5–1.9)

## 2023-03-02 MED ORDER — DOXYCYCLINE HYCLATE 100 MG PO TABS
100.0000 mg | ORAL_TABLET | Freq: Once | ORAL | Status: AC
  Administered 2023-03-02: 100 mg via ORAL
  Filled 2023-03-02: qty 1

## 2023-03-02 MED ORDER — DOXYCYCLINE HYCLATE 100 MG PO CAPS: 100.0000 mg | ORAL_CAPSULE | Freq: Two times a day (BID) | ORAL | 0 refills | Status: DC

## 2023-03-02 NOTE — ED Notes (Signed)
Finger splint to left middle finger placed per MD instruction  Care instruction given to pt at bedside, pt verbalizes understanding of care

## 2023-03-02 NOTE — ED Triage Notes (Signed)
Pt brought by [facility] from home for complaints of right leg swelling. Pt has had partial left leg amputation. Pt also fell today and has an injury to his left arm, left middle finger, and scrotum.

## 2023-03-02 NOTE — ED Notes (Signed)
Patient verbalizes understanding of discharge instructions. Opportunity for questioning and answers were provided. Armband removed by staff, pt discharged from ED. Wheeled outside and assisted into car

## 2023-03-02 NOTE — ED Provider Notes (Addendum)
[place] EMERGENCY DEPARTMENT AT [facility] Provider Note   CSN: [id number] Arrival date & time: 03/02/23  1431     History  Chief Complaint  Patient presents with   Leg Swelling   Fall    Steven Charles is a 77 y.o. male.  He has a history of diabetes hypertension CHF left BKA.  He said he was on the commode today when he fell off, question had a syncopal event.  Injuries to scrotum with bleeding along with left elbow left middle finger.  right leg is also been red and swollen for a while getting worse.  He denies any chest pain or shortness of breath.  He does not think he has had a fever.  The history is provided by the patient.  Fall This is a new problem. The current episode started 1 to 2 hours ago. The problem has not changed since onset.Pertinent negatives include no chest pain, no abdominal pain, no headaches and no shortness of breath. Nothing relieves the symptoms. He has tried nothing for the symptoms. The treatment provided no relief.       Home Medications Prior to Admission medications   Medication Sig Start Date End Date Taking? Authorizing Provider  Ascorbic Acid (VITAMIN C) 500 MG tablet Take 500 mg by mouth 2 (two) times daily.    [provider]  aspirin 81 MG tablet Take 1 tablet (81 mg total) by mouth daily. 04/19/19   Lewayne Bunting, MD  atorvastatin (LIPITOR) 80 MG tablet TAKE 1 TABLET(80 MG) BY MOUTH DAILY 10/04/22   Corwin Levins, MD  blood glucose meter kit and supplies KIT Dispense based on patient and insurance preference. Use up to four times daily as directed. (E11.9) 06/06/17   Corwin Levins, MD  Blood Glucose Monitoring Suppl (ONE TOUCH ULTRA 2) w/Device KIT Use the blood sugar meter to monitor your blood sugar 1-4 times per day as instructed. 06/08/17   Corwin Levins, MD  buPROPion (WELLBUTRIN XL) 300 MG 24 hr tablet TAKE 1 TABLET(300 MG) BY MOUTH DAILY 02/17/23   Corwin Levins, MD  carvedilol (COREG) 6.25 MG tablet Take 1 tablet  (6.25 mg total) by mouth 2 (two) times daily. 12/19/22   Corwin Levins, MD  Cyanocobalamin (VITAMIN B12 PO) Take by mouth.    [provider]  docusate sodium (COLACE) 100 MG capsule Take 100 mg by mouth at bedtime.    [provider]  ezetimibe (ZETIA) 10 MG tablet Take 1 tablet (10 mg total) by mouth daily. 10/27/22   Corwin Levins, MD  fexofenadine (ALLEGRA) 180 MG tablet Take 180 mg by mouth at bedtime.    [provider]  FLUoxetine (PROZAC) 40 MG capsule Take 1 capsule (40 mg total) by mouth daily. 12/19/22   Corwin Levins, MD  furosemide (LASIX) 20 MG tablet Take 1 tablet (20 mg total) by mouth 2 (two) times daily as needed for fluid (retensin). 10/17/22   Corwin Levins, MD  gabapentin (NEURONTIN) 300 MG capsule Take 1 capsule (300 mg total) by mouth 4 (four) times daily. 10/17/22   Corwin Levins, MD  glucose blood (ONE TOUCH ULTRA TEST) test strip Use to chec blood sugars four times a day Dx E11.65 06/08/17   Corwin Levins, MD  HYDROcodone-acetaminophen (NORCO/VICODIN) 5-325 MG tablet Take 1-2 tablets by mouth 2 (two) times daily as needed for moderate pain. 06/23/22   Kathryne Hitch, MD  ibuprofen (ADVIL,MOTRIN) 400  MG tablet Take 400 mg by mouth daily as needed for headache or moderate pain.    [provider]  Lancets Letta Pate ULTRASOFT) lancets Use to help check blood sugar four times a day Dx E11.65 06/08/17   Corwin Levins, MD  lidocaine (LIDODERM) 5 % Place 1 patch onto the skin daily. Remove & Discard patch within 12 hours or as directed by MD 09/11/17   Corwin Levins, MD  lisinopril (ZESTRIL) 20 MG tablet Take 1 tablet (20 mg total) by mouth daily. 12/19/22   Corwin Levins, MD  LORazepam (ATIVAN) 1 MG tablet TAKE 1 TABLET(1 MG) BY MOUTH TWICE DAILY AS NEEDED 12/02/22   Corwin Levins, MD  Multiple Vitamin (MULTIVITAMIN) tablet Take 1 tablet by mouth daily.    [provider]  nystatin (MYCOSTATIN/NYSTOP) powder Use as directed twice per day as  needed 04/09/21   Corwin Levins, MD  Omega-3 Fatty Acids (FISH OIL) 1000 MG CAPS Take 1,000 mg by mouth 2 (two) times daily.     [provider]  pantoprazole (PROTONIX) 40 MG tablet Take 1 tablet (40 mg total) by mouth 2 (two) times daily. 12/19/22   Corwin Levins, MD  Polyethyl Glycol-Propyl Glycol 0.4-0.3 % SOLN Place 1 drop into both eyes daily as needed (for dry eyes). Reported on 10/07/2015    [provider]  potassium chloride SA (KLOR-CON) 20 MEQ tablet TAKE 1 TABLET(20 MEQ) BY MOUTH DAILY 06/18/20   Corwin Levins, MD  Pyridoxine HCl (VITAMIN B6 PO) Take by mouth.    [provider]  tamsulosin (FLOMAX) 0.4 MG CAPS capsule Take 1 capsule (0.4 mg total) by mouth 2 (two) times daily. 08/11/21   Corwin Levins, MD  vitamin E 1000 UNIT capsule Take 1,000 Units by mouth daily.    [provider]      Allergies    Nicoderm [nicotine] and Adhesive [tape]    Review of Systems   Review of Systems  Constitutional:  Negative for fever.  Respiratory:  Negative for shortness of breath.   Cardiovascular:  Positive for leg swelling. Negative for chest pain.  Gastrointestinal:  Negative for abdominal pain.  Genitourinary:  Positive for scrotal swelling.  Skin:  Positive for rash.  Neurological:  Negative for headaches.    Physical Exam Updated Vital Signs BP (!) 128/55   Pulse 66   Temp 97.8 F (36.6 C) (Oral)   Resp 17   SpO2 96%  Physical Exam Vitals and nursing note reviewed.  Constitutional:      General: He is not in acute distress.    Appearance: Normal appearance. He is well-developed.  HENT:     Head: Normocephalic and atraumatic.  Eyes:     Conjunctiva/sclera: Conjunctivae normal.  Cardiovascular:     Rate and Rhythm: Normal rate and regular rhythm.     Heart sounds: No murmur heard. Pulmonary:     Effort: Pulmonary effort is normal. No respiratory distress.     Breath sounds: Normal breath sounds.  Abdominal:     Palpations: Abdomen is  soft.     Tenderness: There is no abdominal tenderness. There is no guarding or rebound.  Genitourinary:    Comments: Scrotum has a few abrasions but no active bleeding.  No significant erythema or masses appreciated. Musculoskeletal:        General: Swelling present.     Cervical back: Neck supple.     Comments: He has some tenderness of his left  middle finger at his DIP.  The finger is dropped question mallet finger.  He is also tender around his left elbow.  Right leg is markedly swollen and there is erythema on the lower leg with some thickened skin.  Left AKA  Skin:    General: Skin is warm and dry.     Capillary Refill: Capillary refill takes less than 2 seconds.     Findings: Rash present.  Neurological:     General: No focal deficit present.     Mental Status: He is alert.     Sensory: No sensory deficit.     Motor: No weakness.     ED Results / Procedures / Treatments   Labs (all labs ordered are listed, but only abnormal results are displayed) Labs Reviewed  BASIC METABOLIC PANEL - Abnormal; Notable for the following components:      Result Value   Glucose, Bld 107 (*)    GFR, Estimated 59 (*)    All other components within normal limits  CBC WITH DIFFERENTIAL/PLATELET - Abnormal; Notable for the following components:   RBC 4.00 (*)    MCV 100.5 (*)    Platelets 149 (*)    All other components within normal limits  CULTURE, BLOOD (ROUTINE X 2)  CULTURE, BLOOD (ROUTINE X 2)  LACTIC ACID, PLASMA  LACTIC ACID, PLASMA    EKG EKG Interpretation Date/Time:  Thursday March 02 2023 16:23:14 EST Ventricular Rate:  65 PR Interval:  200 QRS Duration:  111 QT Interval:  455 QTC Calculation: 474 R Axis:   47  Text Interpretation: Sinus rhythm Inferior infarct, old No significant change since prior 10/24 Confirmed by Meridee Score [id number]) on 03/02/2023 4:26:03 PM  Radiology CT Head Wo Contrast  Result Date: 03/02/2023 CLINICAL DATA:  Fall, head trauma EXAM: CT  HEAD WITHOUT CONTRAST TECHNIQUE: Contiguous axial images were obtained from the base of the skull through the vertex without intravenous contrast. RADIATION DOSE REDUCTION: This exam was performed according to the departmental dose-optimization program which includes automated exposure control, adjustment of the mA and/or kV according to patient size and/or use of iterative reconstruction technique. COMPARISON:  01/01/2014 CT head FINDINGS: Brain: No evidence of acute infarction, hemorrhage, mass, mass effect, or midline shift. No hydrocephalus or extra-axial fluid collection. Periventricular white matter changes, likely the sequela of chronic small vessel ischemic disease. Age related cerebral atrophy, with somewhat disproportionate atrophy in the bilateral frontal lobes. Vascular: No hyperdense vessel. Skull: Negative for fracture or focal lesion. Sinuses/Orbits: Mucosal thickening in the ethmoid air cells. No acute finding in the orbits. Other: Hyperdense masses in the left parotid, which are new compared to 2015. One of these may be vascular. IMPRESSION: 1. No acute intracranial process. 2. 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 Recommend further evaluation with a nonemergent parotid ultrasound. Electronically Signed   By: Wiliam Ke M.D.   On: 03/02/2023 18:34   DG Finger Middle Left  Result Date: 03/02/2023 CLINICAL DATA:  Status post fall.  Finger and elbow pain. EXAM: LEFT ELBOW - COMPLETE 3+ VIEW; LEFT MIDDLE FINGER 2+V COMPARISON:  None Available. FINDINGS: Left middle finger: The bones appear mildly demineralized. No evidence of acute fracture or dislocation. There are mild degenerative changes throughout the visualized interphalangeal and metacarpal phalangeal joints. There are postsurgical changes within the 3rd metatarsal head and the neck of the 5th metatarsal. No unexpected foreign body or soft tissue emphysema identified. Left elbow: No evidence of acute  fracture, dislocation or significant elbow joint effusion. Mild spurring of the coronoid and olecranon processes. No evidence of foreign body or soft tissue emphysema. IMPRESSION: No evidence of acute fracture or dislocation in the left middle finger or elbow. Mild degenerative changes as described. Electronically Signed   By: Carey Bullocks M.D.   On: 03/02/2023 16:55   DG ELBOW COMPLETE LEFT (3+VIEW)  Result Date: 03/02/2023 CLINICAL DATA:  Status post fall.  Finger and elbow pain. EXAM: LEFT ELBOW - COMPLETE 3+ VIEW; LEFT MIDDLE FINGER 2+V COMPARISON:  None Available. FINDINGS: Left middle finger: The bones appear mildly demineralized. No evidence of acute fracture or dislocation. There are mild degenerative changes throughout the visualized interphalangeal and metacarpal phalangeal joints. There are postsurgical changes within the 3rd metatarsal head and the neck of the 5th metatarsal. No unexpected foreign body or soft tissue emphysema identified. Left elbow: No evidence of acute fracture, dislocation or significant elbow joint effusion. Mild spurring of the coronoid and olecranon processes. No evidence of foreign body or soft tissue emphysema. IMPRESSION: No evidence of acute fracture or dislocation in the left middle finger or elbow. Mild degenerative changes as described. Electronically Signed   By: Carey Bullocks M.D.   On: 03/02/2023 16:55   US Venous Img Lower Right (DVT Study)  Result Date: 03/02/2023 CLINICAL DATA:  Right lower extremity pain after falling EXAM: RIGHT LOWER EXTREMITY VENOUS DOPPLER ULTRASOUND TECHNIQUE: Gray-scale sonography with compression, as well as color and duplex ultrasound, were performed to evaluate the deep venous system(s) from the level of the common femoral vein through the popliteal and proximal calf veins. COMPARISON:  None Available. FINDINGS: VENOUS Normal compressibility of the common femoral, superficial femoral, and popliteal veins, as well as the  visualized calf veins. Visualized portions of profunda femoral vein and great saphenous vein unremarkable. No filling defects to suggest DVT on grayscale or color Doppler imaging. Doppler waveforms show normal direction of venous flow, normal respiratory plasticity and response to augmentation. Limited views of the contralateral common femoral vein are unremarkable. OTHER None. Limitations: none IMPRESSION: Negative. Electronically Signed   By: Malachy Moan M.D.   On: 03/02/2023 16:27    Procedures Procedures    Medications Ordered in ED Medications  doxycycline (VIBRA-TABS) tablet 100 mg (100 mg Oral Given 03/02/23 1925)    ED Course/ Medical Decision Making/ A&P Clinical Course as of 03/03/23 0932  Thu Mar 02, 2023  1637 Ultrasound right lower extremity showing no evidence of DVT. [MB]  1644 X-ray of left elbow and left middle finger did not show any obvious fractures.  Awaiting radiology reading. [MB]  1849 CT head does not show any acute findings.  They do comment upon some hyperdense masses in the parotid that we will need nonemergent ultrasound [MB]  1913 Will place his middle finger in a splint and have follow-up with hand.  Will start on antibiotics for possible cellulitis of his lower extremity.  Labs otherwise reassuring as far as infection with normal lactate normal white count.  Blood culture sent.  Patient comfortable plan for outpatient follow-up with his treatment team. [MB]    Clinical Course User Index [MB] Terrilee Files, MD                                 Medical Decision Making Amount and/or Complexity of Data Reviewed Labs: ordered. Radiology: ordered.  Risk Prescription drug management.   This patient complains  of fall injury and laceration to scrotum, elbow injury finger injury right leg swelling; this involves an extensive number of treatment Options and is a complaint that carries with it a high risk of complications and morbidity. The differential  includes cellulitis, DVT, fracture, dislocation, ligamentous injury, scrotal hematoma, scrotal abscess  I ordered, reviewed and interpreted labs, which included CBC with normal white count normal hemoglobin, chemistries unremarkable, blood culture sent, lactate normal I ordered medication oral antibiotics and reviewed PMP when indicated. I ordered imaging studies which included x-rays of finger and elbow, duplex of leg, CT head and I independently    visualized and interpreted imaging which showed no acute findings.  Does have parotid mass that will need follow-up Previous records obtained and reviewed in epic including recent PCP notes Cardiac monitoring reviewed, sinus rhythm Social determinants considered, patient with financial insecurity stress Food insecurity Critical Interventions: None  After the interventions stated above, I reevaluated the patient and found scrotum to have no active bleeding.  Finger will be placed in straight splint.   Admission and further testing considered, no indications for admission at this time.  Will need close follow-up with PCP and orthopedics.  Also let him know about this mass that we will need further imaging.  Patient calling his wife for a ride home. Epic message sent to his primary care doctor regarding the need for imaging.        Final Clinical Impression(s) / ED Diagnoses Final diagnoses:  Contusion of left elbow, initial encounter  Injury of left middle finger, initial encounter  Scrotal injury, initial encounter  Cellulitis of right lower extremity  Parotid mass    Rx / DC Orders ED Discharge Orders     None         Terrilee Files, MD 03/03/23 3244    Terrilee Files, MD 03/03/23 [id number]

## 2023-03-02 NOTE — Discharge Instructions (Signed)
You were seen in the emergency department for evaluation of injuries from a fall.  Your scrotal bleeding had stopped.  You can use cool compress to the area.  You had x-rays of your left middle finger and left elbow that did not show any fracture.  You can use the splint on the finger to keep straight until you follow-up with Dr. Romeo Apple.  Your right lower extremity was reddened.  Your labs did not show any obvious sign of infection and an ultrasound did not show any evidence of a blood clot.  We are starting you on antibiotics for this.  Please try to elevate your leg.  Follow-up with your primary care doctor.  Return if any worsening or concerning symptoms

## 2023-03-03 ENCOUNTER — Telehealth: Payer: Self-pay | Admitting: Internal Medicine

## 2023-03-03 ENCOUNTER — Telehealth: Payer: Self-pay

## 2023-03-03 DIAGNOSIS — K118 Other diseases of salivary glands: Secondary | ICD-10-CM

## 2023-03-03 NOTE — Telephone Encounter (Signed)
Ok to let pt know - I received a note by a provider from his recent ED visit who recommended an ultrasound of the parotid gland (left cheek area) due to a possible mass or sport in that area  I have ordered this, hopefully he will hear soon

## 2023-03-03 NOTE — Transitions of Care (Post Inpatient/ED Visit) (Signed)
03/03/2023  Name: Steven Charles MRN: [id number] DOB: 01-05-1946  Today's TOC FU Call Status: Today's TOC FU Call Status:: Successful TOC FU Call Completed TOC FU Call Complete Date: 03/03/23 Patient's Name and Date of Birth confirmed.  Transition Care Management Follow-up Telephone Call Date of Discharge: 03/02/23 [facility] Type of Discharge: Emergency Department Reason for ED Visit: Other: (contusion) How have you been since you were released from the hospital?: Same Any questions or concerns?: No  Items Reviewed: Did you receive and understand the discharge instructions provided?: Yes Medications obtained,verified, and reconciled?: Yes (Medications Reviewed) Any new allergies since your discharge?: Yes Dietary orders reviewed?: NA Do you have support at home?: Yes People in Home: spouse  Medications Reviewed Today: Medications Reviewed Today     Reviewed by Karena Addison, LPN (Licensed Practical Nurse) on 03/03/23 at [phone]  Med List Status: <None>   Medication Order Taking? Sig Documenting Provider Last Dose Status Informant  Ascorbic Acid (VITAMIN C) 500 MG tablet [id number] No Take 500 mg by mouth 2 (two) times daily. [provider] Taking Active Self           Med Note Mardene Sayer   Wed Aug 26, 2015  6:28 PM)    aspirin 81 MG tablet [id number] No Take 1 tablet (81 mg total) by mouth daily. Lewayne Bunting, MD Taking Active   atorvastatin (LIPITOR) 80 MG tablet [id number] No TAKE 1 TABLET(80 MG) BY MOUTH DAILY Corwin Levins, MD Taking Active   blood glucose meter kit and supplies KIT [id number] No Dispense based on patient and insurance preference. Use up to four times daily as directed. (E11.9) Corwin Levins, MD Taking Active   Blood Glucose Monitoring Suppl (ONE TOUCH ULTRA 2) w/Device KIT [id number] No Use the blood sugar meter to monitor your blood sugar 1-4 times per day as instructed. Corwin Levins, MD Taking Active   buPROPion  (WELLBUTRIN XL) 300 MG 24 hr tablet [id number]  TAKE 1 TABLET(300 MG) BY MOUTH DAILY Corwin Levins, MD  Active   carvedilol (COREG) 6.25 MG tablet [id number] No Take 1 tablet (6.25 mg total) by mouth 2 (two) times daily. Corwin Levins, MD Taking Active   Cyanocobalamin (VITAMIN B12 PO) [id number] No Take by mouth. [provider] Taking Active   docusate sodium (COLACE) 100 MG capsule [id number] No Take 100 mg by mouth at bedtime. [provider] Taking Active Self  doxycycline (VIBRAMYCIN) 100 MG capsule [id number]  Take 1 capsule (100 mg total) by mouth 2 (two) times daily. Terrilee Files, MD  Active   ezetimibe (ZETIA) 10 MG tablet [id number] No Take 1 tablet (10 mg total) by mouth daily. Corwin Levins, MD Taking Active   fexofenadine [facility]) 180 MG tablet [id number] No Take 180 mg by mouth at bedtime. [provider] Taking Active Self  FLUoxetine (PROZAC) 40 MG capsule [id number] No Take 1 capsule (40 mg total) by mouth daily. Corwin Levins, MD Taking Active   furosemide (LASIX) 20 MG tablet [id number] No Take 1 tablet (20 mg total) by mouth 2 (two) times daily as needed for fluid (retensin). Corwin Levins, MD Taking Active   gabapentin (NEURONTIN) 300 MG capsule [id number] No Take 1 capsule (300 mg total) by mouth 4 (four) times daily. Corwin Levins, MD Taking Active   glucose blood (ONE TOUCH ULTRA TEST) test strip [id number] No Use to chec blood sugars four times a day Dx E11.65 Jonny Ruiz,  Len Blalock, MD Taking Active   HYDROcodone-acetaminophen (NORCO/VICODIN) 5-325 MG tablet [id number] No Take 1-2 tablets by mouth 2 (two) times daily as needed for moderate pain. Kathryne Hitch, MD Taking Active   ibuprofen (ADVIL,MOTRIN) 400 MG tablet [id number] No Take 400 mg by mouth daily as needed for headache or moderate pain. [provider] Taking Active Self  Lancets Letta Pate ULTRASOFT) lancets [id number] No Use to help check blood sugar four times a day Dx E11.65 Corwin Levins, MD Taking Active   lidocaine (LIDODERM) 5 % [id number] No Place 1 patch onto the skin daily. Remove & Discard patch within 12 hours or as directed by MD Corwin Levins, MD Taking Active   lisinopril (ZESTRIL) 20 MG tablet [id number] No Take 1 tablet (20 mg total) by mouth daily. Corwin Levins, MD Taking Active   LORazepam (ATIVAN) 1 MG tablet [id number] No TAKE 1 TABLET(1 MG) BY MOUTH TWICE DAILY AS NEEDED Corwin Levins, MD Taking Active   Multiple Vitamin (MULTIVITAMIN) tablet [id number] No Take 1 tablet by mouth daily. [provider] Taking Active Self  nystatin (MYCOSTATIN/NYSTOP) powder [id number] No Use as directed twice per day as needed Corwin Levins, MD Taking Active   Omega-3 Fatty Acids (FISH OIL) 1000 MG CAPS [id number] No Take 1,000 mg by mouth 2 (two) times daily.  [provider] Taking Active Self  pantoprazole (PROTONIX) 40 MG tablet [id number] No Take 1 tablet (40 mg total) by mouth 2 (two) times daily. Corwin Levins, MD Taking Active   Polyethyl Glycol-Propyl Glycol 0.4-0.3 % SOLN [id number] No Place 1 drop into both eyes daily as needed (for dry eyes). Reported on 10/07/2015 [provider] Taking Active Self  potassium chloride SA (KLOR-CON) 20 MEQ tablet [id number] No TAKE 1 TABLET(20 MEQ) BY MOUTH DAILY Corwin Levins, MD Taking Active   Pyridoxine HCl (VITAMIN B6 PO) [id number] No Take by mouth. [provider] Taking Active   tamsulosin (FLOMAX) 0.4 MG CAPS capsule [id number] No Take 1 capsule (0.4 mg total) by mouth 2 (two) times daily. Corwin Levins, MD Taking Active   vitamin E 1000 UNIT capsule [id number] No Take 1,000 Units by mouth daily. [provider] Taking Active             Home Care and Equipment/Supplies: Were Home Health Services Ordered?: NA Any new equipment or medical supplies ordered?: NA  Functional Questionnaire: Do you need assistance with bathing/showering or dressing?: No Do you need assistance with meal  preparation?: No Do you need assistance with eating?: No Do you have difficulty maintaining continence: No Do you need assistance with getting out of bed/getting out of a chair/moving?: No Do you have difficulty managing or taking your medications?: No  Follow up appointments reviewed: PCP Follow-up appointment confirmed?: Yes Date of PCP follow-up appointment?: 03/10/23 Follow-up Provider: [facility] Follow-up appointment confirmed?: NA Do you need transportation to your follow-up appointment?: No Do you understand care options if your condition(s) worsen?: Yes-patient verbalized understanding    SIGNATURE Karena Addison, LPN [facility] Nurse Health Advisor Direct Dial [phone]

## 2023-03-06 NOTE — Telephone Encounter (Signed)
Called and unable to leave voicemail. If Pt calls back please relay Dr.John message.

## 2023-03-07 LAB — CULTURE, BLOOD (ROUTINE X 2)
Culture: NO GROWTH
Culture: NO GROWTH
Special Requests: ADEQUATE

## 2023-03-10 ENCOUNTER — Encounter: Payer: Self-pay | Admitting: Internal Medicine

## 2023-03-10 ENCOUNTER — Ambulatory Visit (INDEPENDENT_AMBULATORY_CARE_PROVIDER_SITE_OTHER): Payer: Medicare Other | Admitting: Internal Medicine

## 2023-03-10 VITALS — BP 128/70 | HR 79 | Temp 98.7°F | Ht 71.0 in

## 2023-03-10 DIAGNOSIS — E1165 Type 2 diabetes mellitus with hyperglycemia: Secondary | ICD-10-CM

## 2023-03-10 DIAGNOSIS — Z23 Encounter for immunization: Secondary | ICD-10-CM

## 2023-03-10 DIAGNOSIS — I10 Essential (primary) hypertension: Secondary | ICD-10-CM | POA: Diagnosis not present

## 2023-03-10 DIAGNOSIS — L03116 Cellulitis of left lower limb: Secondary | ICD-10-CM | POA: Diagnosis not present

## 2023-03-10 DIAGNOSIS — J3089 Other allergic rhinitis: Secondary | ICD-10-CM | POA: Diagnosis not present

## 2023-03-10 DIAGNOSIS — K118 Other diseases of salivary glands: Secondary | ICD-10-CM | POA: Diagnosis not present

## 2023-03-10 MED ORDER — METHYLPREDNISOLONE ACETATE 80 MG/ML IJ SUSP
80.0000 mg | Freq: Once | INTRAMUSCULAR | Status: AC
  Administered 2023-03-10: 80 mg via INTRAMUSCULAR
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
## 2023-03-10 NOTE — Patient Instructions (Addendum)
You had the steroid shot today  Ok to finish the antibiotic   Please continue all other medications as before, and refills have been done if requested.  Please have the pharmacy call with any other refills you may need.  Please continue your efforts at being more active, low cholesterol diet, and weight control.  Please keep your appointments with your specialists as you may have planned - orthopedic dec 16  Please make an Appointment to return in 3 months, or sooner if needed

## 2023-03-12 ENCOUNTER — Encounter: Payer: Self-pay | Admitting: Internal Medicine

## 2023-03-12 NOTE — Assessment & Plan Note (Signed)
BP Readings from Last 3 Encounters:  03/10/23 128/70  03/02/23 (!) 150/77  01/12/23 130/78   Stable, pt to continue medical treatment coreg 6 25 bid, lsinopril 20 qd

## 2023-03-12 NOTE — Assessment & Plan Note (Signed)
Improving, pt to continue doxycyline course,  to f/u any worsening symptoms or concerns

## 2023-03-12 NOTE — Assessment & Plan Note (Signed)
Mild to mod, for depomedrol 80 mg IM, to f/u any worsening symptoms or concerns 

## 2023-03-12 NOTE — Assessment & Plan Note (Signed)
Lab Results  Component Value Date   HGBA1C 5.6 08/15/2022   Stable, pt to continue current medical treatment  - diet,wt control

## 2023-03-13 ENCOUNTER — Ambulatory Visit: Payer: Medicare Other | Admitting: Orthopedic Surgery

## 2023-03-15 ENCOUNTER — Other Ambulatory Visit: Payer: Self-pay | Admitting: Internal Medicine

## 2023-03-15 ENCOUNTER — Other Ambulatory Visit: Payer: Self-pay

## 2023-03-30 ENCOUNTER — Ambulatory Visit: Payer: Medicare Other | Admitting: Orthopedic Surgery

## 2023-04-05 ENCOUNTER — Telehealth: Payer: Self-pay | Admitting: Internal Medicine

## 2023-04-05 MED ORDER — DOXYCYCLINE HYCLATE 100 MG PO CAPS: 100.0000 mg | ORAL_CAPSULE | Freq: Two times a day (BID) | ORAL | 0 refills | Status: DC

## 2023-04-05 NOTE — Telephone Encounter (Signed)
 Done erx

## 2023-04-05 NOTE — Telephone Encounter (Signed)
 Copied from CRM [id number]. Topic: Clinical - Medication Refill >> Apr 05, 2023  1:38 PM Burnard DEL wrote: Most Recent Primary Care Visit:  Provider: [facility]  Department: [facility]  Visit Type: HOSPITAL FU  Date: 03/10/2023  Medication: doxycycline  (VIBRAMYCIN ) 100 MG capsule  Has the patient contacted their pharmacy? Yes (Agent: If no, request that the patient contact the pharmacy for the refill. If patient does not wish to contact the pharmacy document the reason why and proceed with request.) (Agent: If yes, when and what did the pharmacy advise?)  Is this the correct pharmacy for this prescription? Yes If no, delete pharmacy and type the correct one.  This is the patient's preferred pharmacy:  [id number] - [place], [place] - [address] [place] [address] Phone: [phone] Fax: [phone]   Has the prescription been filled recently? Yes  Is the patient out of the medication? Yes  Has the patient been seen for an appointment in the last year OR does the patient have an upcoming appointment? Yes  Can we respond through MyChart? Yes  Agent: Please be advised that Rx refills may take up to 3 business days. We ask that you follow-up with your pharmacy.

## 2023-04-06 NOTE — Progress Notes (Deleted)
  Intake history:  There were no vitals taken for this visit. There is no height or weight on file to calculate BMI.    WHAT ARE WE SEEING YOU FOR TODAY?   Steven Charles has left middle finger injury and left elbow pain   How long has this bothered you? (DOI?DOS?WS?)  on 03/02/2023  Anticoag.  No  Diabetes {yes/no:20286}  Heart disease Yes  Hypertension Yes  SMOKING HX / former   Kidney disease No  Any ALLERGIES ______________________________________________   Treatment:  Have you taken:  Tylenol  {yes/no:20286}  Advil  {yes/no:20286}  Had PT {yes/no:20286}  Had injection {yes/no:20286}  Other  _________________________

## 2023-04-10 ENCOUNTER — Ambulatory Visit: Payer: Medicare Other | Admitting: Orthopedic Surgery

## 2023-04-12 NOTE — Progress Notes (Signed)
  Intake history:  BP 121/62   Pulse 73   Ht 5\' 11"  (1.803 m)   Wt 279 lb (126.6 kg)   BMI 38.91 kg/m  Body mass index is 38.91 kg/m.    WHAT ARE WE SEEING YOU FOR TODAY?   left hand(s)  How long has this bothered you? ER 03/02/23  03/02/23  Anticoag.  No  Diabetes Yes  Heart disease Yes  Hypertension Yes  SMOKING HX Not currently / former   Kidney disease No  Any ALLERGIES ______________________________________________   Treatment:  Have you taken:  Tylenol No  Advil Yes  Had PT No  Had injection No  Other  _________________________

## 2023-04-13 ENCOUNTER — Other Ambulatory Visit: Payer: Self-pay | Admitting: Internal Medicine

## 2023-04-13 DIAGNOSIS — K118 Other diseases of salivary glands: Secondary | ICD-10-CM

## 2023-04-14 ENCOUNTER — Encounter: Payer: Self-pay | Admitting: Orthopedic Surgery

## 2023-04-14 ENCOUNTER — Ambulatory Visit (INDEPENDENT_AMBULATORY_CARE_PROVIDER_SITE_OTHER): Payer: Medicare Other | Admitting: Orthopedic Surgery

## 2023-04-14 VITALS — BP 121/62 | HR 73 | Ht 71.0 in | Wt 279.0 lb

## 2023-04-14 DIAGNOSIS — M20012 Mallet finger of left finger(s): Secondary | ICD-10-CM | POA: Diagnosis not present

## 2023-04-14 DIAGNOSIS — I872 Venous insufficiency (chronic) (peripheral): Secondary | ICD-10-CM

## 2023-04-14 NOTE — Patient Instructions (Signed)
Expect a call from vascular surgery in [place] or in [place] for your right leg

## 2023-04-14 NOTE — Progress Notes (Signed)
  Intake history:  BP 121/62   Pulse 73   Ht 5\' 11"  (1.803 m)   Wt 279 lb (126.6 kg)   BMI 38.91 kg/m  Body mass index is 38.91 kg/m.    WHAT ARE WE SEEING YOU FOR TODAY?   left hand(s)  How long has this bothered you? ER 03/02/23  03/02/23  Anticoag.  No  Diabetes Yes  Heart disease Yes  Hypertension Yes  SMOKING HX Not currently / former   Kidney disease No  Any ALLERGIES ______________________________________________   Treatment:  Have you taken:  Tylenol No  Advil Yes  Had PT No  Had injection No  Other  _________________________   Chief Complaint  Patient presents with   Hand Injury    Left middle finger 03/02/23 / painful swollen and red    Leg Problem    Has wound right leg, will make appointment with Dr Magnus Ivan he previously took care of him.    78 year old male had a left BKA does not really have a wound on his right leg just has chronic venous stasis changes in his skin and swelling a little more than usual  He has a mallet finger on the left long or middle finger which is flexible has an old mallet on the small finger which is chronically malpositioned  X-rays reviewed no fracture in the left long finger  Right leg venous stasis changes with swelling some erythema no infection Sandy rough gritty skin  Mallet finger left long finger Chronic venous stasis  Recheck mallet finger 3 weeks splinted  Vein and vascular consult

## 2023-05-03 NOTE — Progress Notes (Deleted)
   There were no vitals taken for this visit.  There is no height or weight on file to calculate BMI.  No chief complaint on file.   No diagnosis found.  DOI/DOS/ {Date:23773::"***"}  {CHL AMB ORT SYMPTOMS POST TREATMENT:21798}

## 2023-05-05 ENCOUNTER — Ambulatory Visit: Payer: Medicare HMO | Admitting: Orthopedic Surgery

## 2023-06-05 ENCOUNTER — Ambulatory Visit (INDEPENDENT_AMBULATORY_CARE_PROVIDER_SITE_OTHER): Payer: Medicare HMO

## 2023-06-05 VITALS — Ht 71.0 in | Wt 279.0 lb

## 2023-06-05 DIAGNOSIS — Z0000 Encounter for general adult medical examination without abnormal findings: Secondary | ICD-10-CM
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
## 2023-06-05 NOTE — Patient Instructions (Signed)
 Mr. Poplaski , Thank you for taking time to come for your Medicare Wellness Visit. I appreciate your ongoing commitment to your health goals. Please review the following plan we discussed and let me know if I can assist you in the future.   Referrals/Orders/Follow-Ups/Clinician Recommendations: It was nice talking with you today.  You are due for a eye exam and a Shingles vaccine.  Each day, aim for 6 glasses of water, plenty of protein in your diet / stretch every hour for 5-10 minutes at a time.    This is a list of the screening recommended for you and due dates:  Health Maintenance  Topic Date Due   Zoster (Shingles) Vaccine (1 of 2) 08/16/1993   Medicare Annual Wellness Visit  11/12/2020   Hemoglobin A1C  02/15/2023   Eye exam for diabetics  05/12/2023   Yearly kidney health urinalysis for diabetes  08/15/2023   Complete foot exam   08/15/2023   Yearly kidney function blood test for diabetes  03/01/2024   DTaP/Tdap/Td vaccine (3 - Td or Tdap) 06/05/2028   Pneumonia Vaccine  Completed   Flu Shot  Completed   Hepatitis C Screening  Completed   HPV Vaccine  Aged Out   COVID-19 Vaccine  Discontinued    Advanced directives: (Declined) Advance directive discussed with you today. Even though you declined this today, please call our office should you change your mind, and we can give you the proper paperwork for you to fill out.  Next Medicare Annual Wellness Visit scheduled for next year: Yes

## 2023-06-07 ENCOUNTER — Other Ambulatory Visit: Payer: Self-pay

## 2023-06-07 ENCOUNTER — Encounter: Payer: Self-pay | Admitting: Internal Medicine

## 2023-06-07 ENCOUNTER — Ambulatory Visit (INDEPENDENT_AMBULATORY_CARE_PROVIDER_SITE_OTHER): Payer: Medicare Other | Admitting: Internal Medicine

## 2023-06-07 VITALS — BP 124/76 | HR 57 | Temp 98.7°F | Ht 71.0 in

## 2023-06-07 DIAGNOSIS — E1165 Type 2 diabetes mellitus with hyperglycemia: Secondary | ICD-10-CM

## 2023-06-07 DIAGNOSIS — Z125 Encounter for screening for malignant neoplasm of prostate: Secondary | ICD-10-CM

## 2023-06-07 DIAGNOSIS — E538 Deficiency of other specified B group vitamins: Secondary | ICD-10-CM

## 2023-06-07 DIAGNOSIS — I10 Essential (primary) hypertension: Secondary | ICD-10-CM

## 2023-06-07 DIAGNOSIS — E559 Vitamin D deficiency, unspecified: Secondary | ICD-10-CM

## 2023-06-07 DIAGNOSIS — I872 Venous insufficiency (chronic) (peripheral): Secondary | ICD-10-CM

## 2023-06-07 DIAGNOSIS — J3089 Other allergic rhinitis: Secondary | ICD-10-CM

## 2023-06-07 DIAGNOSIS — E7800 Pure hypercholesterolemia, unspecified: Secondary | ICD-10-CM

## 2023-06-07 DIAGNOSIS — Z0001 Encounter for general adult medical examination with abnormal findings: Secondary | ICD-10-CM | POA: Diagnosis not present

## 2023-06-07 DIAGNOSIS — T7840XA Allergy, unspecified, initial encounter: Secondary | ICD-10-CM

## 2023-06-07 LAB — MICROALBUMIN / CREATININE URINE RATIO
Creatinine,U: 77.7 mg/dL
Microalb Creat Ratio: 48.7 mg/g — ABNORMAL HIGH (ref 0.0–30.0)
Microalb, Ur: 3.8 mg/dL — ABNORMAL HIGH (ref 0.0–1.9)

## 2023-06-07 LAB — BASIC METABOLIC PANEL
BUN: 20 mg/dL (ref 6–23)
CO2: 30 meq/L (ref 19–32)
Calcium: 10.1 mg/dL (ref 8.4–10.5)
Chloride: 104 meq/L (ref 96–112)
Creatinine, Ser: 1.25 mg/dL (ref 0.40–1.50)
GFR: 54.65 mL/min — ABNORMAL LOW (ref >60.00)
Glucose, Bld: 93 mg/dL (ref 70–99)
Potassium: 4.2 meq/L (ref 3.5–5.1)
Sodium: 139 meq/L (ref 135–145)

## 2023-06-07 LAB — URINALYSIS, ROUTINE W REFLEX MICROSCOPIC
Bilirubin Urine: NEGATIVE
Ketones, ur: NEGATIVE
Nitrite: NEGATIVE
Specific Gravity, Urine: 1.015 (ref 1.000–1.030)
Total Protein, Urine: NEGATIVE
Urine Glucose: NEGATIVE
Urobilinogen, UA: 1 (ref 0.0–1.0)
pH: 7 (ref 5.0–8.0)

## 2023-06-07 LAB — CBC WITH DIFFERENTIAL/PLATELET
Basophils Absolute: 0.1 10*3/uL (ref 0.0–0.1)
Basophils Relative: 0.7 % (ref 0.0–3.0)
Eosinophils Absolute: 0.2 10*3/uL (ref 0.0–0.7)
Eosinophils Relative: 2.9 % (ref 0.0–5.0)
HCT: 41.2 % (ref 39.0–52.0)
Hemoglobin: 14.3 g/dL (ref 13.0–17.0)
Lymphocytes Relative: 21.5 % (ref 12.0–46.0)
Lymphs Abs: 1.5 10*3/uL (ref 0.7–4.0)
MCHC: 34.8 g/dL (ref 30.0–36.0)
MCV: 99.3 fl (ref 78.0–100.0)
Monocytes Absolute: 0.6 10*3/uL (ref 0.1–1.0)
Monocytes Relative: 8.9 % (ref 3.0–12.0)
Neutro Abs: 4.6 10*3/uL (ref 1.4–7.7)
Neutrophils Relative %: 66 % (ref 43.0–77.0)
Platelets: 126 10*3/uL — ABNORMAL LOW (ref 150.0–400.0)
RBC: 4.15 Mil/uL — ABNORMAL LOW (ref 4.22–5.81)
RDW: 13.2 % (ref 11.5–15.5)
WBC: 6.9 10*3/uL (ref 4.0–10.5)

## 2023-06-07 LAB — HEPATIC FUNCTION PANEL
ALT: 21 U/L (ref 0–53)
AST: 20 U/L (ref 0–37)
Albumin: 3.9 g/dL (ref 3.5–5.2)
Alkaline Phosphatase: 106 U/L (ref 39–117)
Bilirubin, Direct: 0.2 mg/dL (ref 0.0–0.3)
Total Bilirubin: 0.9 mg/dL (ref 0.2–1.2)
Total Protein: 6.6 g/dL (ref 6.0–8.3)

## 2023-06-07 LAB — LIPID PANEL
Cholesterol: 108 mg/dL (ref 0–200)
HDL: 36.2 mg/dL — ABNORMAL LOW (ref >39.00)
LDL Cholesterol: 54 mg/dL (ref 0–99)
NonHDL: 72.05
Total CHOL/HDL Ratio: 3
Triglycerides: 89 mg/dL (ref 0.0–149.0)
VLDL: 17.8 mg/dL (ref 0.0–40.0)

## 2023-06-07 LAB — VITAMIN D 25 HYDROXY (VIT D DEFICIENCY, FRACTURES): VITD: 37.35 ng/mL (ref 30.00–100.00)

## 2023-06-07 LAB — TSH: TSH: 5.4 u[IU]/mL (ref 0.35–5.50)

## 2023-06-07 LAB — VITAMIN B12: Vitamin B-12: 622 pg/mL (ref 211–911)

## 2023-06-07 LAB — HEMOGLOBIN A1C: Hgb A1c MFr Bld: 5.6 % (ref 4.6–6.5)

## 2023-06-07 MED ORDER — METHYLPREDNISOLONE ACETATE 80 MG/ML IJ SUSP
80.0000 mg | Freq: Once | INTRAMUSCULAR | Status: AC
  Administered 2023-06-07: 80 mg via INTRAMUSCULAR

## 2023-06-07 MED ORDER — METAXALONE 800 MG PO TABS: 800.0000 mg | ORAL_TABLET | Freq: Three times a day (TID) | ORAL | 1 refills | Status: DC | PRN

## 2023-06-07 NOTE — Patient Instructions (Addendum)
 You had the steroid shot today  Please take all new medication as prescribed - the skelaxin for the left leg  Please have your Shingrix (shingles) shots done at your local pharmacy.  Please continue all other medications as before, and refills have been done if requested.  Please have the pharmacy call with any other refills you may need.  Please continue your efforts at being more active, low cholesterol diet, and weight control.  You are otherwise up to date with prevention measures today.  Please keep your appointments with your specialists as you may have planned  Please go to the LAB at the blood drawing area for the tests to be done  You will be contacted by phone if any changes need to be made immediately.  Otherwise, you will receive a letter about your results with an explanation, but please check with MyChart first.  Please make an Appointment to return in 6 months, or sooner if needed
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
## 2023-06-07 NOTE — Progress Notes (Signed)
 The test results show that your current treatment is OK, as the tests are stable.  Please continue the same plan.  There is no other need for change of treatment or further evaluation based on these results, at this time.  thanks

## 2023-06-08 ENCOUNTER — Telehealth: Payer: Self-pay | Admitting: Internal Medicine

## 2023-06-08 NOTE — Telephone Encounter (Signed)
 Copied from CRM [id number]. Topic: Clinical - Prescription Issue >> Jun 08, 2023  3:46 PM Turkey A wrote: Reason for CRM: Patient's wife called and said that they can not afford metaxalone (SKELAXIN) 800 MG tablet; wanted to know if there is a different medication

## 2023-06-10 ENCOUNTER — Encounter: Payer: Self-pay | Admitting: Internal Medicine

## 2023-06-10 NOTE — Assessment & Plan Note (Signed)
 Lab Results  Component Value Date   LDLCALC 54 06/07/2023   Stable, pt to continue current statin lipitor 80 every day, zetia 10 qd

## 2023-06-10 NOTE — Assessment & Plan Note (Signed)
Mild to mod, for depomedrol 80 mg IM,,  to f/u any worsening symptoms or concerns

## 2023-06-10 NOTE — Assessment & Plan Note (Signed)
 Age and sex appropriate education and counseling updated with regular exercise and diet Referrals for preventative services - pt will call for eye exam Immunizations addressed - for shingrix at pharmacy Smoking counseling  - none needed Evidence for depression or other mood disorder - none significant Most recent labs reviewed. I have personally reviewed and have noted: 1) the patient's medical and social history 2) The patient's current medications and supplements 3) The patient's height, weight, and BMI have been recorded in the chart

## 2023-06-10 NOTE — Assessment & Plan Note (Signed)
 Lab Results  Component Value Date   VITAMINB12 622 06/07/2023   Stable, cont oral replacement - b12 1000 mcg qd

## 2023-06-10 NOTE — Assessment & Plan Note (Signed)
 Lab Results  Component Value Date   HGBA1C 5.6 06/07/2023   Stable, pt to continue current medical treatment - diet,wt control

## 2023-06-10 NOTE — Assessment & Plan Note (Signed)
 BP Readings from Last 3 Encounters:  06/07/23 124/76  04/14/23 121/62  03/10/23 128/70   Stable, pt to continue medical treatment lisionpril 20 every day, coreg 6.25 bid

## 2023-06-12 ENCOUNTER — Other Ambulatory Visit: Payer: Self-pay | Admitting: Internal Medicine

## 2023-06-12 NOTE — Telephone Encounter (Signed)
 Copied from CRM [id number]. Topic: Clinical - Medication Refill >> Jun 12, 2023  4:09 PM Drema Balzarine wrote: Most Recent Primary Care Visit:  Provider: Corwin Levins  Department: [facility]  Visit Type: OFFICE VISIT  Date: 06/07/2023  Medication: LORazepam   Has the patient contacted their pharmacy? Yes (Agent: If no, request that the patient contact the pharmacy for the refill. If patient does not wish to contact the pharmacy document the reason why and proceed with request.) (Agent: If yes, when and what did the pharmacy advise?)  Is this the correct pharmacy for this prescription? Yes If no, delete pharmacy and type the correct one.  This is the patient's preferred pharmacy:    [id number] - [place], [place] - [address] [place] [address] Phone: [phone] Fax: [phone]   Has the prescription been filled recently? Yes  Is the patient out of the medication? No, two left   Has the patient been seen for an appointment in the last year OR does the patient have an upcoming appointment? Yes  Can we respond through MyChart? Yes  Agent: Please be advised that Rx refills may take up to 3 business days. We ask that you follow-up with your pharmacy.

## 2023-06-12 NOTE — Telephone Encounter (Signed)
 Last Fill: 12/02/22  Last OV: 06/07/23 Next OV: 12/08/23  Routing to provider for review/authorization.

## 2023-06-15 ENCOUNTER — Ambulatory Visit (HOSPITAL_COMMUNITY): Payer: Medicare Other

## 2023-07-20 ENCOUNTER — Encounter: Payer: Self-pay | Admitting: Radiology

## 2023-07-24 ENCOUNTER — Other Ambulatory Visit: Payer: Self-pay

## 2023-07-24 ENCOUNTER — Emergency Department (HOSPITAL_COMMUNITY)

## 2023-07-24 ENCOUNTER — Emergency Department (HOSPITAL_COMMUNITY)
Admission: EM | Admit: 2023-07-24 | Discharge: 2023-07-25 | Disposition: A | Discharge status: Home / Self Care | Attending: Emergency Medicine | Admitting: Emergency Medicine

## 2023-07-24 ENCOUNTER — Encounter (HOSPITAL_COMMUNITY): Payer: Self-pay

## 2023-07-24 DIAGNOSIS — R55 Syncope and collapse: Secondary | ICD-10-CM | POA: Diagnosis not present

## 2023-07-24 DIAGNOSIS — R0600 Dyspnea, unspecified: Secondary | ICD-10-CM | POA: Diagnosis not present

## 2023-07-24 DIAGNOSIS — I2510 Atherosclerotic heart disease of native coronary artery without angina pectoris: Secondary | ICD-10-CM | POA: Diagnosis not present

## 2023-07-24 DIAGNOSIS — M79602 Pain in left arm: Secondary | ICD-10-CM | POA: Diagnosis not present

## 2023-07-24 DIAGNOSIS — R600 Localized edema: Secondary | ICD-10-CM | POA: Diagnosis not present

## 2023-07-24 DIAGNOSIS — I10 Essential (primary) hypertension: Secondary | ICD-10-CM | POA: Diagnosis not present

## 2023-07-24 DIAGNOSIS — R0689 Other abnormalities of breathing: Secondary | ICD-10-CM | POA: Diagnosis not present

## 2023-07-24 DIAGNOSIS — R42 Dizziness and giddiness: Secondary | ICD-10-CM | POA: Diagnosis not present

## 2023-07-24 DIAGNOSIS — Z955 Presence of coronary angioplasty implant and graft: Secondary | ICD-10-CM | POA: Diagnosis not present

## 2023-07-24 DIAGNOSIS — R0789 Other chest pain: Secondary | ICD-10-CM | POA: Diagnosis not present

## 2023-07-24 DIAGNOSIS — R110 Nausea: Secondary | ICD-10-CM | POA: Diagnosis not present

## 2023-07-24 DIAGNOSIS — E041 Nontoxic single thyroid nodule: Secondary | ICD-10-CM

## 2023-07-24 DIAGNOSIS — R079 Chest pain, unspecified: Secondary | ICD-10-CM | POA: Insufficient documentation

## 2023-07-24 DIAGNOSIS — R0602 Shortness of breath: Secondary | ICD-10-CM | POA: Insufficient documentation

## 2023-07-24 DIAGNOSIS — R609 Edema, unspecified: Secondary | ICD-10-CM | POA: Diagnosis not present

## 2023-07-24 DIAGNOSIS — R918 Other nonspecific abnormal finding of lung field: Secondary | ICD-10-CM | POA: Diagnosis not present

## 2023-07-24 DIAGNOSIS — G4489 Other headache syndrome: Secondary | ICD-10-CM | POA: Diagnosis not present

## 2023-07-24 DIAGNOSIS — Z7982 Long term (current) use of aspirin: Secondary | ICD-10-CM | POA: Diagnosis not present

## 2023-07-24 DIAGNOSIS — Z79899 Other long term (current) drug therapy: Secondary | ICD-10-CM | POA: Insufficient documentation

## 2023-07-24 DIAGNOSIS — M7989 Other specified soft tissue disorders: Secondary | ICD-10-CM | POA: Diagnosis not present

## 2023-07-24 LAB — CBC
HCT: 38.5 % — ABNORMAL LOW (ref 39.0–52.0)
Hemoglobin: 13.2 g/dL (ref 13.0–17.0)
MCH: 34 pg (ref 26.0–34.0)
MCHC: 34.3 g/dL (ref 30.0–36.0)
MCV: 99.2 fL (ref 80.0–100.0)
Platelets: 153 10*3/uL (ref 150–400)
RBC: 3.88 MIL/uL — ABNORMAL LOW (ref 4.22–5.81)
RDW: 13.1 % (ref 11.5–15.5)
WBC: 8.6 10*3/uL (ref 4.0–10.5)
nRBC: 0 % (ref 0.0–0.2)

## 2023-07-24 LAB — BASIC METABOLIC PANEL WITH GFR
Anion gap: 8 (ref 5–15)
BUN: 16 mg/dL (ref 8–23)
CO2: 27 mmol/L (ref 22–32)
Calcium: 9.6 mg/dL (ref 8.9–10.3)
Chloride: 103 mmol/L (ref 98–111)
Creatinine, Ser: 1.06 mg/dL (ref 0.61–1.24)
GFR, Estimated: >60 mL/min (ref >60)
Glucose, Bld: 90 mg/dL (ref 70–99)
Potassium: 3.7 mmol/L (ref 3.5–5.1)
Sodium: 138 mmol/L (ref 135–145)

## 2023-07-24 LAB — BRAIN NATRIURETIC PEPTIDE: B Natriuretic Peptide: 251 pg/mL — ABNORMAL HIGH (ref 0.0–100.0)

## 2023-07-24 LAB — TROPONIN I (HIGH SENSITIVITY)
Troponin I (High Sensitivity): 12 ng/L (ref <18)
Troponin I (High Sensitivity): 12 ng/L (ref <18)

## 2023-07-24 LAB — D-DIMER, QUANTITATIVE: D-Dimer, Quant: 0.94 ug{FEU}/mL — ABNORMAL HIGH (ref 0.00–0.50)

## 2023-07-24 MED ORDER — FUROSEMIDE 10 MG/ML IJ SOLN
40.0000 mg | Freq: Once | INTRAMUSCULAR | Status: AC
  Administered 2023-07-24: 40 mg via INTRAVENOUS
  Filled 2023-07-24: qty 4

## 2023-07-24 MED ORDER — POTASSIUM CHLORIDE CRYS ER 20 MEQ PO TBCR
40.0000 meq | EXTENDED_RELEASE_TABLET | Freq: Once | ORAL | Status: AC
  Administered 2023-07-24: 40 meq via ORAL
  Filled 2023-07-24: qty 2

## 2023-07-24 NOTE — ED Triage Notes (Addendum)
 Pt from home. Stated, "Chest pain started couple of weeks ago. Comes and goes. Burning sensation. Left arm is painful and goes to left side of the heart." Nausea denies emesis episode. Intermittent headaches.

## 2023-07-24 NOTE — ED Provider Notes (Signed)
 [place] EMERGENCY DEPARTMENT AT [facility] Provider Note   CSN: [id number] Arrival date & time: 07/24/23  1605     History  Chief Complaint  Patient presents with   Chest Pain    Steven Charles is a 78 y.o. male with past medical history of CAD, CABG x 3, MI (2007), CAD, HTN, HLD presents to Emergency Department for evaluation of intermittent chest pain and shortness of breath over the past 2 weeks.  Reports that he has mild shortness of breath at rest and chest pain worsens with exertion.  He endorses that chest pain feels like a "burning sensation" on the left side of his chest that radiates into his left upper arm at 3/10 in intensity.  Also complains of right lower extremity swelling and weeping over the past few days. Denies purulent drainage  Today, he sought ED evaluation as he had his "worst episode" this morning of chest burning with associated lightheadedness and presyncope feeling.  Is compliant with meds.  Takes Lasix  20 mg in the morning every day   Chest Pain      Home Medications Prior to Admission medications   Medication Sig Start Date End Date Taking? Authorizing Provider  cephALEXin  (KEFLEX ) 500 MG capsule Take 1 capsule (500 mg total) by mouth 2 (two) times daily. 07/25/23  Yes Royann Cords, PA  Ascorbic Acid  (VITAMIN C ) 500 MG tablet Take 500 mg by mouth 2 (two) times daily.    [provider]  aspirin  81 MG tablet Take 1 tablet (81 mg total) by mouth daily. 04/19/19   Lenise Quince, MD  atorvastatin  (LIPITOR) 80 MG tablet TAKE 1 TABLET(80 MG) BY MOUTH DAILY 03/15/23   Roslyn Coombe, MD  blood glucose meter kit and supplies KIT Dispense based on patient and insurance preference. Use up to four times daily as directed. (E11.9) 06/06/17   Roslyn Coombe, MD  Blood Glucose Monitoring Suppl (ONE TOUCH ULTRA 2) w/Device KIT Use the blood sugar meter to monitor your blood sugar 1-4 times per day as instructed. 06/08/17   Roslyn Coombe, MD   buPROPion  (WELLBUTRIN  XL) 300 MG 24 hr tablet TAKE 1 TABLET(300 MG) BY MOUTH DAILY 02/17/23   Roslyn Coombe, MD  carvedilol  (COREG ) 6.25 MG tablet Take 1 tablet (6.25 mg total) by mouth 2 (two) times daily. 12/19/22   Roslyn Coombe, MD  Cyanocobalamin  (VITAMIN B12 PO) Take by mouth.    [provider]  docusate sodium  (COLACE) 100 MG capsule Take 100 mg by mouth at bedtime.    [provider]  ezetimibe  (ZETIA ) 10 MG tablet Take 1 tablet (10 mg total) by mouth daily. 10/27/22   Roslyn Coombe, MD  fexofenadine  (ALLEGRA ) 180 MG tablet Take 180 mg by mouth at bedtime.    [provider]  FLUoxetine  (PROZAC ) 40 MG capsule Take 1 capsule (40 mg total) by mouth daily. 12/19/22   Roslyn Coombe, MD  furosemide  (LASIX ) 20 MG tablet Take 1 tablet (20 mg total) by mouth 2 (two) times daily as needed for fluid (retensin). 10/17/22   Roslyn Coombe, MD  gabapentin  (NEURONTIN ) 300 MG capsule Take 1 capsule (300 mg total) by mouth 4 (four) times daily. 10/17/22   Roslyn Coombe, MD  glucose blood (ONE TOUCH ULTRA TEST) test strip Use to chec blood sugars four times a day Dx E11.65 06/08/17   Roslyn Coombe, MD  ibuprofen  (ADVIL ,MOTRIN ) 400 MG tablet Take 400 mg  by mouth daily as needed for headache or moderate pain.    [provider]  Lancets [name] Harman ULTRASOFT) lancets Use to help check blood sugar four times a day Dx E11.65 06/08/17   Roslyn Coombe, MD  lidocaine  (LIDODERM ) 5 % Place 1 patch onto the skin daily. Remove & Discard patch within 12 hours or as directed by MD 09/11/17   Roslyn Coombe, MD  lisinopril  (ZESTRIL ) 20 MG tablet Take 1 tablet (20 mg total) by mouth daily. 12/19/22   Roslyn Coombe, MD  LORazepam  (ATIVAN ) 1 MG tablet TAKE 1 TABLET(1 MG) BY MOUTH TWICE DAILY AS NEEDED 06/12/23   Roslyn Coombe, MD  metaxalone  (SKELAXIN ) 800 MG tablet Take 1 tablet (800 mg total) by mouth 3 (three) times daily as needed for muscle spasms. 06/07/23   Roslyn Coombe, MD  Multiple Vitamin  (MULTIVITAMIN) tablet Take 1 tablet by mouth daily.    [provider]  nystatin  (MYCOSTATIN /NYSTOP ) powder Use as directed twice per day as needed 04/09/21   Roslyn Coombe, MD  Omega-3 Fatty Acids (FISH OIL) 1000 MG CAPS Take 1,000 mg by mouth 2 (two) times daily.     [provider]  pantoprazole  (PROTONIX ) 40 MG tablet Take 1 tablet (40 mg total) by mouth 2 (two) times daily. 12/19/22   Roslyn Coombe, MD  Polyethyl Glycol-Propyl Glycol 0.4-0.3 % SOLN Place 1 drop into both eyes daily as needed (for dry eyes). Reported on 10/07/2015    [provider]  potassium chloride  SA (KLOR-CON ) 20 MEQ tablet TAKE 1 TABLET(20 MEQ) BY MOUTH DAILY 06/18/20   Roslyn Coombe, MD  Pyridoxine HCl (VITAMIN B6 PO) Take by mouth.    [provider]  tamsulosin  (FLOMAX ) 0.4 MG CAPS capsule Take 1 capsule (0.4 mg total) by mouth 2 (two) times daily. 08/11/21   Roslyn Coombe, MD  vitamin E 1000 UNIT capsule Take 1,000 Units by mouth daily.    [provider]      Allergies    Nicoderm [nicotine] and Adhesive [tape]    Review of Systems   Review of Systems  Cardiovascular:  Positive for chest pain.    Physical Exam Updated Vital Signs BP (!) 162/71   Pulse 65   Temp 98.1 F (36.7 C)   Resp 14   Ht 5\' 11"  (1.803 m)   SpO2 94%   BMI 38.91 kg/m  Physical Exam Vitals and nursing note reviewed.  Constitutional:      General: He is not in acute distress.    Appearance: Normal appearance. He is not ill-appearing.  HENT:     Head: Normocephalic and atraumatic.  Eyes:     Conjunctiva/sclera: Conjunctivae normal.  Cardiovascular:     Rate and Rhythm: Normal rate.     Pulses:          Dorsalis pedis pulses are 1+ on the right side. Left dorsalis pedis pulse not accessible.  Pulmonary:     Effort: Pulmonary effort is normal. No tachypnea, accessory muscle usage or respiratory distress.     Breath sounds: Examination of the left-middle field reveals decreased breath  sounds. Examination of the left-lower field reveals decreased breath sounds. Decreased breath sounds present.     Comments: Speaking in full and complete sentences without difficulty.  No signs of respiratory distress Musculoskeletal:     Right lower leg: 2+ Pitting Edema present.     Left Lower Extremity: Left leg is amputated above knee.  Skin:    Capillary Refill: Capillary refill takes less than 2 seconds.     Coloration: Skin is not jaundiced or pale.  Neurological:     Mental Status: He is alert and oriented to person, place, and time. Mental status is at baseline.     ED Results / Procedures / Treatments   Labs (all labs ordered are listed, but only abnormal results are displayed) Labs Reviewed  CBC - Abnormal; Notable for the following components:      Result Value   RBC 3.88 (*)    HCT 38.5 (*)    All other components within normal limits  BRAIN NATRIURETIC PEPTIDE - Abnormal; Notable for the following components:   B Natriuretic Peptide 251.0 (*)    All other components within normal limits  D-DIMER, QUANTITATIVE - Abnormal; Notable for the following components:   D-Dimer, Quant 0.94 (*)    All other components within normal limits  BASIC METABOLIC PANEL WITH GFR  TROPONIN I (HIGH SENSITIVITY)  TROPONIN I (HIGH SENSITIVITY)    EKG EKG Interpretation Date/Time:  Monday July 24 2023 16:28:41 EDT Ventricular Rate:  59 PR Interval:  197 QRS Duration:  108 QT Interval:  431 QTC Calculation: 427 R Axis:   53  Text Interpretation: Sinus rhythm Probable inferior infarct, old Confirmed by Annita Kindle [phone]) on 07/24/2023 7:29:24 PM  Radiology DG Chest Port 1 View Result Date: 07/24/2023 CLINICAL DATA:  Chest pain starting a couple of weeks ago. Burning sensation. Left arm pain. Nausea. EXAM: PORTABLE CHEST 1 VIEW COMPARISON:  01/08/2016 FINDINGS: Postoperative changes in the mediastinum. Shallow inspiration. Cardiac enlargement. No vascular congestion, edema, or  consolidation. Blunting of the left costophrenic angle may represent small effusion or pleural thickening. No pneumothorax. Mediastinal contours appear intact. Degenerative changes in the spine and shoulder IMPRESSION: Shallow inspiration. Cardiac enlargement. Fluid or thickened pleura in the left costophrenic angle. Electronically Signed   By: Boyce Byes M.D.   On: 07/24/2023 17:32    Procedures Procedures    Medications Ordered in ED Medications  furosemide  (LASIX ) injection 40 mg (40 mg Intravenous Given 07/24/23 1846)  potassium chloride  SA (KLOR-CON  M) CR tablet 40 mEq (40 mEq Oral Given 07/24/23 2301)  iohexol  (OMNIPAQUE ) 350 MG/ML injection 75 mL (75 mLs Intravenous Contrast Given 07/25/23 0009)    ED Course/ Medical Decision Making/ A&P Clinical Course as of 07/25/23 0046  Mon Jul 24, 2023  1929 DG Chest Medulla 1 View Shallow inspiration. Cardiac enlargement. Fluid or thickened pleura in the left costophrenic angle.   [HN]    Clinical Course User Index [HN] Merdis Stalling, MD                                 Medical Decision Making Amount and/or Complexity of Data Reviewed Labs: ordered. Radiology: ordered. Decision-making details documented in ED Course.  Risk Prescription drug management.    Patient presents to the ED for concern of shob, CP, this involves an extensive number of treatment options, and is a complaint that carries with it a high risk of complications and morbidity.  The differential diagnosis includes infection, PNA, ACS, PE, fluid overload, hypoxia, cellulitis, DVT   Co morbidities that complicate the patient evaluation  See HPI   Additional history obtained:  Additional history obtained from  Nursing   External records from outside source obtained and reviewed including triage RN note   Lab Tests:  I  Ordered, and personally interpreted labs.  The pertinent results include:   Trop neg x2 Dimer 0.92 BNP 251    Imaging Studies  ordered:  I ordered imaging studies including CXR, CTPE  I independently visualized and interpreted imaging which showed unilateral fluid or thickened pleura in left costophrenic angle I agree with the radiologist interpretation CTPE pending   Cardiac Monitoring:  The patient was maintained on a cardiac monitor.  I personally viewed and interpreted the cardiac monitored which showed an underlying rhythm of: NSR with no STE nor ischemia   Medicines ordered and prescription drug management:  I ordered medication including lasix   for elevated BNP  Reevaluation of the patient after these medicines showed that the patient stayed the same I have reviewed the patients home medicines and have made adjustments as needed     Problem List / ED Course:  Chest pain Trop neg x2 EKG NSR wo STE nor ischemia Dyspnea Dimer 0.92 - will obtain CTPE. 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  Right extremity is pitting 2+.  No obvious signs of drainage.  It is red and warm. Denies fevers at home.  No fevers while in the emergency department.  No leukocytosis Fluid vs cellulitis - will provide keflex  for home for infection prophylaxis Provided lasix  here in ED   Reevaluation:  After the interventions noted above, I reevaluated the patient and found that they have :stayed the same     Dispostion:  Dispo pending CTPE study. If normal, patient can be discharged home with cardiology follow up. I also sent keflex  to his pharmacy for possible cellulitis vs edema of leg. Discussed ED workup thus far and expected plan  Sign out to Dr. Carol Chroman pending CT scan  Discussed patient with Dr. Drury Geralds who individually assessed patient and agrees to plan Final Clinical Impression(s) / ED Diagnoses Final diagnoses:  Chest pain, unspecified type   Dyspnea, unspecified type  Pedal edema    Rx / DC Orders ED Discharge Orders          Ordered    cephALEXin  (KEFLEX ) 500 MG capsule  2 times daily        07/25/23 0046              Royann Cords, PA 07/26/23 1324    Merdis Stalling, MD 07/27/23 2033

## 2023-07-25 ENCOUNTER — Emergency Department (HOSPITAL_COMMUNITY)

## 2023-07-25 DIAGNOSIS — R079 Chest pain, unspecified: Secondary | ICD-10-CM | POA: Diagnosis not present

## 2023-07-25 MED ORDER — IOHEXOL 350 MG/ML SOLN
75.0000 mL | Freq: Once | INTRAVENOUS | Status: AC | PRN
  Administered 2023-07-25: 75 mL via INTRAVENOUS

## 2023-07-25 MED ORDER — CEPHALEXIN 500 MG PO CAPS: 500.0000 mg | ORAL_CAPSULE | Freq: Two times a day (BID) | ORAL | 0 refills | Status: DC

## 2023-07-25 NOTE — Discharge Instructions (Addendum)
 Thank you for let us  evaluate you today.  Your 2 troponins/cardiac enzymes were negative for cardiac injury.  I have sent Keflex  to your pharmacy to cover for a possible infection in right leg.  I provided you with a dose of Lasix  here.  Please follow-up with cardiology this week regarding further management  Return to Emergency Department if you experience chest pain, shortness of breath, significant worsening symptoms

## 2023-08-03 ENCOUNTER — Ambulatory Visit (INDEPENDENT_AMBULATORY_CARE_PROVIDER_SITE_OTHER): Admitting: Internal Medicine

## 2023-08-03 ENCOUNTER — Encounter: Payer: Self-pay | Admitting: Internal Medicine

## 2023-08-03 VITALS — BP 126/74 | HR 63 | Temp 98.6°F | Ht 71.0 in | Wt 290.0 lb

## 2023-08-03 DIAGNOSIS — L03115 Cellulitis of right lower limb: Secondary | ICD-10-CM | POA: Diagnosis not present

## 2023-08-03 DIAGNOSIS — E041 Nontoxic single thyroid nodule: Secondary | ICD-10-CM | POA: Diagnosis not present

## 2023-08-03 DIAGNOSIS — E1165 Type 2 diabetes mellitus with hyperglycemia: Secondary | ICD-10-CM

## 2023-08-03 DIAGNOSIS — R079 Chest pain, unspecified: Secondary | ICD-10-CM | POA: Diagnosis not present

## 2023-08-03 MED ORDER — DOXYCYCLINE HYCLATE 100 MG PO TABS: 100.0000 mg | ORAL_TABLET | Freq: Two times a day (BID) | ORAL | 0 refills | Status: DC
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
## 2023-08-03 NOTE — Assessment & Plan Note (Signed)
 Noted incidentally on recent CTA chest - for thyroid  u/s, may need FNA biopsy

## 2023-08-03 NOTE — Assessment & Plan Note (Signed)
 Resolved post Ed, atypical, etiology unclear, for cardiology f/u as planned

## 2023-08-03 NOTE — Assessment & Plan Note (Signed)
 Worsening again today incidentally - for doxycycline  100 bid course

## 2023-08-03 NOTE — Assessment & Plan Note (Signed)
 Lab Results  Component Value Date   HGBA1C 5.6 06/07/2023   Stable, pt to continue current medical treatment - diet,wt control

## 2023-08-03 NOTE — Patient Instructions (Signed)
 Please take all new medication as prescribed  the doxycycline  100 mg  Please continue all other medications as before, and refills have been done if requested.  Please have the pharmacy call with any other refills you may need.  Please continue your efforts at being more active, low cholesterol diet, and weight control.  Please keep your appointments with your specialists as you may have planned- Dr Audery [facility] may 22  You will be contacted regarding the referral for: thyroid  ultrasound
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
## 2023-08-07 ENCOUNTER — Ambulatory Visit (HOSPITAL_COMMUNITY): Admission: RE | Admit: 2023-08-07 | Source: Ambulatory Visit

## 2023-08-07 ENCOUNTER — Ambulatory Visit: Attending: Vascular Surgery

## 2023-08-10 ENCOUNTER — Other Ambulatory Visit: Payer: Self-pay

## 2023-08-10 ENCOUNTER — Other Ambulatory Visit: Payer: Self-pay | Admitting: Internal Medicine

## 2023-08-10 MED ORDER — NYSTATIN 100000 UNIT/GM EX POWD
CUTANEOUS | 2 refills | Status: AC
Start: 2023-08-10 — End: ?

## 2023-08-10 NOTE — Telephone Encounter (Unsigned)
 Copied from CRM [id number]. Topic: Clinical - Medication Refill >> Aug 10, 2023  2:46 PM Taleah C wrote: Medication: nystatin    Has the patient contacted their pharmacy? Yes They told him to call the clinic.   This is the patient's preferred pharmacy:   [id number] - [place], [place] - [address] [place] [address] Phone: [phone] Fax: [phone]  Is this the correct pharmacy for this prescription? Yes If no, delete pharmacy and type the correct one.   Has the prescription been filled recently? No  Is the patient out of the medication? Yes  Has the patient been seen for an appointment in the last year OR does the patient have an upcoming appointment? No  Can we respond through MyChart? No  Agent: Please be advised that Rx refills may take up to 3 business days. We ask that you follow-up with your pharmacy.

## 2023-08-17 ENCOUNTER — Encounter: Payer: Self-pay | Admitting: Cardiology

## 2023-08-17 ENCOUNTER — Ambulatory Visit: Payer: Medicare Other | Attending: Cardiology | Admitting: Cardiology

## 2023-08-17 VITALS — BP 180/72 | HR 61 | Ht 71.0 in

## 2023-08-17 DIAGNOSIS — I10 Essential (primary) hypertension: Secondary | ICD-10-CM | POA: Diagnosis not present

## 2023-08-17 DIAGNOSIS — R600 Localized edema: Secondary | ICD-10-CM | POA: Diagnosis not present

## 2023-08-17 DIAGNOSIS — E785 Hyperlipidemia, unspecified: Secondary | ICD-10-CM

## 2023-08-17 DIAGNOSIS — I2510 Atherosclerotic heart disease of native coronary artery without angina pectoris: Secondary | ICD-10-CM | POA: Diagnosis not present

## 2023-08-17 DIAGNOSIS — I6523 Occlusion and stenosis of bilateral carotid arteries: Secondary | ICD-10-CM | POA: Diagnosis not present

## 2023-08-17 MED ORDER — FUROSEMIDE 20 MG PO TABS: 40.0000 mg | ORAL_TABLET | Freq: Every day | ORAL | 3 refills | Status: AC

## 2023-08-17 MED ORDER — LISINOPRIL 40 MG PO TABS: 40.0000 mg | ORAL_TABLET | Freq: Every day | ORAL | 3 refills | Status: AC

## 2023-08-17 NOTE — Patient Instructions (Signed)
 Medication Instructions:   INCREASE LISINOPRIL  TO 40 MG ONCE DAILY= 2 OF THE 20 MG TABLETS ONCE DAILY  START FUROSEMIDE  40 MG ONCE DAILY=2 OF THE 20 MG TABLETS ONCE DAILY  CONTINUE POTASSIUM 20 MEQ ONCE DAILY  *If you need a refill on your cardiac medications before your next appointment, please call your pharmacy*  Lab Work:  Your physician recommends that you return for lab work in: ONE WEEK-DO NOT NEED TO FAST  If you have labs (blood work) drawn today and your tests are completely normal, you will receive your results only by: MyChart Message (if you have MyChart) OR A paper copy in the mail If you have any lab test that is abnormal or we need to change your treatment, we will call you to review the results.  Testing/Procedures:  Your physician has requested that you have an echocardiogram. Echocardiography is a painless test that uses sound waves to create images of your heart. It provides your doctor with information about the size and shape of your heart and how well your heart's chambers and valves are working. This procedure takes approximately one hour. There are no restrictions for this procedure. Please do NOT wear cologne, perfume, aftershave, or lotions (deodorant is allowed). Please arrive 15 minutes prior to your appointment time.  Please note: We ask at that you not bring children with you during ultrasound (echo/ vascular) testing. Due to room size and safety concerns, children are not allowed in the ultrasound rooms during exams. Our front office staff cannot provide observation of children in our lobby area while testing is being conducted. An adult accompanying a patient to their appointment will only be allowed in the ultrasound room at the discretion of the ultrasound technician under special circumstances. We apologize for any inconvenience. [address] FLO[address]  Follow-Up: At [facility], you and your health needs are our priority.  As part of our  continuing mission to provide you with exceptional heart care, our providers are all part of one team.  This team includes your primary Cardiologist (physician) and Advanced Practice Providers or APPs (Physician Assistants and Nurse Practitioners) who all work together to provide you with the care you need, when you need it.  Your next appointment:   6 week(s)  Provider:   One of our Advanced Practice Providers (APPs): Melita Springer, PA-C  Friddie Jetty, NP Evaline Hill, NP  Theotis Flake, PA-C Lawana Pray, NP  Willis Harter, PA-C Lovette Rud, PA-C  Custer, PA-C Ernest Dick, NP  Marlana Silvan, NP Marcie Sever, PA-C  Laquita Plant, PA-C    Dayna Dunn, PA-C  Scott Weaver, PA-C Palmer Bobo, NP Katlyn West, NP Callie Goodrich, PA-C  Evan Williams, PA-C Sheng Haley, PA-C  Xika Zhao, NP Kathleen Johnson, PA-C   Then, Alexandria Angel, MD will plan to see you again in 6 month(s).

## 2023-08-31 ENCOUNTER — Ambulatory Visit: Payer: Self-pay | Admitting: Cardiology

## 2023-08-31 LAB — BASIC METABOLIC PANEL WITH GFR
BUN/Creatinine Ratio: 11 (ref 10–24)
BUN: 15 mg/dL (ref 8–27)
CO2: 23 mmol/L (ref 20–29)
Calcium: 9.1 mg/dL (ref 8.6–10.2)
Chloride: 106 mmol/L (ref 96–106)
Creatinine, Ser: 1.35 mg/dL — ABNORMAL HIGH (ref 0.76–1.27)
Glucose: 132 mg/dL — ABNORMAL HIGH (ref 70–99)
Potassium: 3.6 mmol/L (ref 3.5–5.2)
Sodium: 145 mmol/L — ABNORMAL HIGH (ref 134–144)
eGFR: 53 mL/min/{1.73_m2} — ABNORMAL LOW (ref >59)

## 2023-09-04 ENCOUNTER — Ambulatory Visit (INDEPENDENT_AMBULATORY_CARE_PROVIDER_SITE_OTHER): Admitting: Family Medicine

## 2023-09-04 ENCOUNTER — Encounter: Payer: Self-pay | Admitting: Family Medicine

## 2023-09-04 ENCOUNTER — Ambulatory Visit: Payer: Self-pay

## 2023-09-04 ENCOUNTER — Other Ambulatory Visit: Payer: Self-pay

## 2023-09-04 VITALS — BP 134/64 | HR 60 | Temp 98.1°F | Resp 18 | Ht 71.0 in | Wt 275.0 lb

## 2023-09-04 DIAGNOSIS — E1165 Type 2 diabetes mellitus with hyperglycemia: Secondary | ICD-10-CM

## 2023-09-04 DIAGNOSIS — I872 Venous insufficiency (chronic) (peripheral): Secondary | ICD-10-CM

## 2023-09-04 DIAGNOSIS — R600 Localized edema: Secondary | ICD-10-CM

## 2023-09-04 MED ORDER — BLOOD GLUCOSE MONITORING SUPPL DEVI: 1.0000 | Freq: Three times a day (TID) | 0 refills | Status: DC | PRN

## 2023-09-04 MED ORDER — LANCETS MISC. MISC: 1.0000 | Freq: Three times a day (TID) | 0 refills | Status: AC | PRN

## 2023-09-04 MED ORDER — BLOOD GLUCOSE TEST VI STRP: 1.0000 | ORAL_STRIP | Freq: Three times a day (TID) | 0 refills | Status: AC | PRN

## 2023-09-04 MED ORDER — LANCET DEVICE MISC: 1.0000 | Freq: Three times a day (TID) | 0 refills | Status: AC | PRN

## 2023-09-04 NOTE — Patient Instructions (Addendum)
 Compression hose or ACE wrap every morning. Remove at bedtime. Try to sleep in the bed with foot elevated above level of the heart. Keep foot up when sitting during the day.  Keep appointments for echo and vascular u/s.

## 2023-09-04 NOTE — Telephone Encounter (Signed)
  FYI Only or Action Required?: FYI only for provider  Patient was last seen in primary care on 08/03/2023 by Roslyn Coombe, MD. Called Nurse Triage reporting Leg Swelling. Symptoms began several years ago. Interventions attempted: Rest, hydration, or home remedies. Symptoms are: gradually worsening.  Triage Disposition: See Physician Within 24 Hours  Patient/caregiver understands and will follow disposition?: Yes    Copied from CRM [id number]. Topic: Clinical - Medication Question >> Sep 04, 2023  9:12 AM Alpha Arts wrote: Reason for CRM: Patient is requesting a refill of Doxycycline  HYC 100 MG Tablet and he stated this helps with the swelling in his legs  Callback #: [id number]  Preferred Pharmacy:  [id number] - [place], [place] - [address] [place] [address] Phone: [phone] Fax: [phone] Hours: Not open 24 hours Reason for Disposition  [1] MODERATE leg swelling (e.g., swelling extends up to knees) AND [2] new-onset or worsening  Answer Assessment - Initial Assessment Questions 1. ONSET: "When did the swelling start?" (e.g., minutes, hours, days)     Months ago 2. LOCATION: "What part of the leg is swollen?"  "Are both legs swollen or just one leg?"     Right leg 3. SEVERITY: "How bad is the swelling?" (e.g., localized; mild, moderate, severe)   - Localized: Small area of swelling localized to one leg.   - MILD pedal edema: Swelling limited to foot and ankle, pitting edema < 1/4 inch (6 mm) deep, rest and elevation eliminate most or all swelling.   - MODERATE edema: Swelling of lower leg to knee, pitting edema > 1/4 inch (6 mm) deep, rest and elevation only partially reduce swelling.   - SEVERE edema: Swelling extends above knee, facial or hand swelling present.      Tight swelling from foot to lower leg. Blistering below knee 4. REDNESS: "Does the swelling look red or infected?"     Red 5.  PAIN: "Is the swelling painful to touch?" If Yes, ask: "How painful is it?"   (Scale 1-10; mild, moderate or severe)     Burning-moderate 6. FEVER: "Do you have a fever?" If Yes, ask: "What is it, how was it measured, and when did it start?"      denies 7. CAUSE: "What do you think is causing the leg swelling?"     Unsure 8. MEDICAL HISTORY: "Do you have a history of blood clots (e.g., DVT), cancer, heart failure, kidney disease, or liver failure?"     yes 9. RECURRENT SYMPTOM: "Have you had leg swelling before?" If Yes, ask: "When was the last time?" "What happened that time?"     Yes 10. OTHER SYMPTOMS: "Do you have any other symptoms?" (e.g., chest pain, difficulty breathing)       Weeping   Wound care center unable to see him until July. Left leg amputation  Protocols used: Leg Swelling and Edema-A-AH

## 2023-09-04 NOTE — Progress Notes (Signed)
 Assessment & Plan:  1. Edema of right lower leg (Primary) Encouraged to keep upcoming appointments for echocardiogram, vascular u/s, and at the wound center. Discussed the wait at the wound center is typical. Encouraged patient to get in the bed nightly and elevate his foot above the level of the heart. In the morning he should apply compression hose, which he should remove at bedtime. If he is sitting during the day, he should elevate his leg as much as possible. Education provided on edema.    Follow up plan: Return if symptoms worsen or fail to improve.  Hershel Los, MSN, APRN, FNP-C  Subjective:  HPI: Steven Charles is a 78 y.o. male presenting on 09/04/2023 for Edema (Right leg edema - start over a month ago, weeping. Had a recommendation to go to wound care, next appt there JULY 17th. /)  Patient is accompanied by his wife and stepson, who he is okay with being present.  Patient reports right leg swelling with occasional weeping that started a couple of months ago. When it was weeping his wife wrapped his leg in gauze wrap covered with an ACE-wrap with reduced swelling and stopped weeping. He previously saw cardiology who ordered a referral to wound care and a repeat echocardiogram. His wound care appointment is scheduled for July 17th. He is on a cancellation list. He is concerned that the appointment is so far out. Denies chest pain and shortness of breath.    ROS: Negative unless specifically indicated above in HPI.   Relevant past medical history reviewed and updated as indicated.   Allergies and medications reviewed and updated.   Current Outpatient Medications:    Ascorbic Acid  (VITAMIN C ) 500 MG tablet, Take 500 mg by mouth 2 (two) times daily., Disp: , Rfl:    aspirin  81 MG tablet, Take 1 tablet (81 mg total) by mouth daily., Disp: , Rfl:    atorvastatin  (LIPITOR) 80 MG tablet, TAKE 1 TABLET(80 MG) BY MOUTH DAILY, Disp: 90 tablet, Rfl: 1   blood glucose meter kit and  supplies KIT, Dispense based on patient and insurance preference. Use up to four times daily as directed. (E11.9), Disp: 1 each, Rfl: 0   Blood Glucose Monitoring Suppl (ONE TOUCH ULTRA 2) w/Device KIT, Use the blood sugar meter to monitor your blood sugar 1-4 times per day as instructed., Disp: 1 each, Rfl: 0   carvedilol  (COREG ) 6.25 MG tablet, Take 1 tablet (6.25 mg total) by mouth 2 (two) times daily., Disp: 180 tablet, Rfl: 3   Cyanocobalamin  (VITAMIN B12 PO), Take 500 mg by mouth in the morning and at bedtime., Disp: , Rfl:    docusate sodium  (COLACE) 100 MG capsule, Take 100 mg by mouth at bedtime., Disp: , Rfl:    ezetimibe  (ZETIA ) 10 MG tablet, Take 1 tablet (10 mg total) by mouth daily., Disp: 90 tablet, Rfl: 3   fexofenadine  (ALLEGRA ) 180 MG tablet, Take 180 mg by mouth at bedtime., Disp: , Rfl:    FLUoxetine  (PROZAC ) 40 MG capsule, Take 1 capsule (40 mg total) by mouth daily., Disp: 90 capsule, Rfl: 3   furosemide  (LASIX ) 20 MG tablet, Take 2 tablets (40 mg total) by mouth daily., Disp: 180 tablet, Rfl: 3   gabapentin  (NEURONTIN ) 300 MG capsule, Take 1 capsule (300 mg total) by mouth 4 (four) times daily., Disp: 360 capsule, Rfl: 1   ibuprofen  (ADVIL ,MOTRIN ) 400 MG tablet, Take 400 mg by mouth daily as needed for headache or moderate pain., Disp: ,  Rfl:    lidocaine  (LIDODERM ) 5 %, Place 1 patch onto the skin daily. Remove & Discard patch within 12 hours or as directed by MD, Disp: 60 patch, Rfl: 1   lisinopril  (ZESTRIL ) 40 MG tablet, Take 1 tablet (40 mg total) by mouth daily., Disp: 90 tablet, Rfl: 3   LORazepam  (ATIVAN ) 1 MG tablet, TAKE 1 TABLET(1 MG) BY MOUTH TWICE DAILY AS NEEDED, Disp: 60 tablet, Rfl: 2   Multiple Vitamin (MULTIVITAMIN) tablet, Take 1 tablet by mouth daily., Disp: , Rfl:    nystatin  (MYCOSTATIN /NYSTOP ) powder, Use as directed twice per day as needed, Disp: 45 g, Rfl: 2   Omega-3 Fatty Acids (FISH OIL) 1000 MG CAPS, Take 1,000 mg by mouth 2 (two) times daily. ,  Disp: , Rfl:    pantoprazole  (PROTONIX ) 40 MG tablet, Take 1 tablet (40 mg total) by mouth 2 (two) times daily., Disp: 180 tablet, Rfl: 3   potassium chloride  SA (KLOR-CON ) 20 MEQ tablet, TAKE 1 TABLET(20 MEQ) BY MOUTH DAILY, Disp: 90 tablet, Rfl: 2   Pyridoxine HCl (VITAMIN B6 PO), Take by mouth., Disp: , Rfl:    tamsulosin  (FLOMAX ) 0.4 MG CAPS capsule, Take 1 capsule (0.4 mg total) by mouth 2 (two) times daily., Disp: 180 capsule, Rfl: 3   vitamin E 1000 UNIT capsule, Take 1,000 Units by mouth daily., Disp: , Rfl:    glucose blood (ONE TOUCH ULTRA TEST) test strip, Use to chec blood sugars four times a day Dx E11.65 (Patient not taking: Reported on 09/04/2023), Disp: 400 each, Rfl: 1   Lancets (ONETOUCH ULTRASOFT) lancets, Use to help check blood sugar four times a day Dx E11.65 (Patient not taking: Reported on 09/04/2023), Disp: 100 each, Rfl: 12  Allergies  Allergen Reactions   Nicoderm [Nicotine] Other (See Comments)    Heart rate dropped    Adhesive [Tape] Itching and Rash    Please use "paper" tape    Objective:   BP 134/64   Pulse 60   Temp 98.1 F (36.7 C)   Resp 18   Ht 5\' 11"  (1.803 m)   Wt 275 lb (124.7 kg)   SpO2 98%   BMI 38.35 kg/m    Physical Exam Vitals reviewed.  Constitutional:      General: He is not in acute distress.    Appearance: Normal appearance. He is not ill-appearing, toxic-appearing or diaphoretic.  HENT:     Head: Normocephalic and atraumatic.  Eyes:     General: No scleral icterus.       Right eye: No discharge.        Left eye: No discharge.     Conjunctiva/sclera: Conjunctivae normal.  Cardiovascular:     Rate and Rhythm: Normal rate and regular rhythm.     Heart sounds: Normal heart sounds. No murmur heard.    No friction rub. No gallop.  Pulmonary:     Effort: Pulmonary effort is normal. No respiratory distress.     Breath sounds: Normal breath sounds. No stridor. No wheezing, rhonchi or rales.  Musculoskeletal:        General: Normal  range of motion.     Cervical back: Normal range of motion.     Right lower leg: 2+ Pitting Edema present.     Comments: Right leg skin has woody/pebbly appearance consistent with lymphedema.  Skin:    General: Skin is warm and dry.  Neurological:     Mental Status: He is alert and oriented to person, place, and time.  Mental status is at baseline.  Psychiatric:        Mood and Affect: Mood normal.        Behavior: Behavior normal.        Thought Content: Thought content normal.        Judgment: Judgment normal.
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
## 2023-09-20 ENCOUNTER — Ambulatory Visit (HOSPITAL_COMMUNITY)
Admission: RE | Admit: 2023-09-20 | Discharge: 2023-09-20 | Disposition: A | Discharge status: Home / Self Care | Source: Ambulatory Visit | Attending: Vascular Surgery | Admitting: Vascular Surgery

## 2023-09-20 DIAGNOSIS — I872 Venous insufficiency (chronic) (peripheral): Secondary | ICD-10-CM | POA: Diagnosis not present

## 2023-09-21 ENCOUNTER — Ambulatory Visit: Attending: Internal Medicine

## 2023-09-26 ENCOUNTER — Ambulatory Visit (HOSPITAL_COMMUNITY)
Admission: RE | Admit: 2023-09-26 | Discharge: 2023-09-26 | Disposition: A | Discharge status: Home / Self Care | Source: Ambulatory Visit | Attending: Cardiology | Admitting: Cardiology

## 2023-09-26 DIAGNOSIS — R600 Localized edema: Secondary | ICD-10-CM | POA: Diagnosis not present

## 2023-09-26 MED ORDER — PERFLUTREN LIPID MICROSPHERE
3.0000 mL | INTRAVENOUS | Status: AC | PRN
  Administered 2023-09-26: 3 mL via INTRAVENOUS
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
## 2023-09-28 ENCOUNTER — Ambulatory Visit: Attending: General Practice | Admitting: General Practice

## 2023-09-28 LAB — ECHOCARDIOGRAM COMPLETE
Area-P 1/2: 3.6 cm2
S' Lateral: 3.7 cm

## 2023-10-02 ENCOUNTER — Other Ambulatory Visit: Payer: Self-pay | Admitting: Internal Medicine

## 2023-10-02 MED ORDER — LORAZEPAM 1 MG PO TABS: ORAL_TABLET | ORAL | 2 refills | Status: DC

## 2023-10-02 NOTE — Telephone Encounter (Signed)
 Copied from CRM [id number]. Topic: Clinical - Medication Refill >> Oct 02, 2023  3:52 PM Suzen RAMAN wrote: Medication: LORazepam  (ATIVAN ) 1 MG tablet  Has the patient contacted their pharmacy? Yes  This is the patient's preferred pharmacy:  [facility] - [place], [place] - [address] [place] [address] Phone: [phone] Fax: [phone]  Is this the correct pharmacy for this prescription? Yes If no, delete pharmacy and type the correct one.   Has the prescription been filled recently? No  Is the patient out of the medication? Yes  Has the patient been seen for an appointment in the last year OR does the patient have an upcoming appointment? Yes  Can we respond through MyChart? Yes  Agent: Please be advised that Rx refills may take up to 3 business days. We ask that you follow-up with your pharmacy.

## 2023-10-09 ENCOUNTER — Other Ambulatory Visit: Payer: Self-pay | Admitting: Internal Medicine

## 2023-10-12 ENCOUNTER — Encounter (HOSPITAL_BASED_OUTPATIENT_CLINIC_OR_DEPARTMENT_OTHER): Attending: General Surgery | Admitting: General Surgery

## 2023-10-12 DIAGNOSIS — E11622 Type 2 diabetes mellitus with other skin ulcer: Secondary | ICD-10-CM | POA: Insufficient documentation

## 2023-10-12 DIAGNOSIS — L97811 Non-pressure chronic ulcer of other part of right lower leg limited to breakdown of skin: Secondary | ICD-10-CM | POA: Insufficient documentation

## 2023-10-12 DIAGNOSIS — I890 Lymphedema, not elsewhere classified: Secondary | ICD-10-CM | POA: Insufficient documentation

## 2023-10-12 DIAGNOSIS — Z89612 Acquired absence of left leg above knee: Secondary | ICD-10-CM | POA: Diagnosis not present

## 2023-10-12 DIAGNOSIS — I872 Venous insufficiency (chronic) (peripheral): Secondary | ICD-10-CM | POA: Diagnosis not present

## 2023-10-23 ENCOUNTER — Encounter (HOSPITAL_BASED_OUTPATIENT_CLINIC_OR_DEPARTMENT_OTHER): Admitting: General Surgery

## 2023-10-25 ENCOUNTER — Other Ambulatory Visit: Payer: Self-pay | Admitting: Internal Medicine

## 2023-10-25 ENCOUNTER — Encounter (HOSPITAL_BASED_OUTPATIENT_CLINIC_OR_DEPARTMENT_OTHER): Admitting: General Surgery

## 2023-10-30 ENCOUNTER — Encounter (HOSPITAL_BASED_OUTPATIENT_CLINIC_OR_DEPARTMENT_OTHER): Attending: General Surgery | Admitting: General Surgery

## 2023-10-30 DIAGNOSIS — L97811 Non-pressure chronic ulcer of other part of right lower leg limited to breakdown of skin: Secondary | ICD-10-CM | POA: Diagnosis not present

## 2023-10-30 DIAGNOSIS — E11622 Type 2 diabetes mellitus with other skin ulcer: Secondary | ICD-10-CM | POA: Diagnosis not present

## 2023-10-30 DIAGNOSIS — I890 Lymphedema, not elsewhere classified: Secondary | ICD-10-CM | POA: Diagnosis not present

## 2023-10-30 DIAGNOSIS — I872 Venous insufficiency (chronic) (peripheral): Secondary | ICD-10-CM | POA: Diagnosis not present

## 2023-10-30 DIAGNOSIS — L97812 Non-pressure chronic ulcer of other part of right lower leg with fat layer exposed: Secondary | ICD-10-CM | POA: Diagnosis not present

## 2023-10-30 DIAGNOSIS — E1151 Type 2 diabetes mellitus with diabetic peripheral angiopathy without gangrene: Secondary | ICD-10-CM | POA: Diagnosis not present

## 2023-10-30 DIAGNOSIS — Z89612 Acquired absence of left leg above knee: Secondary | ICD-10-CM | POA: Insufficient documentation

## 2023-10-31 ENCOUNTER — Other Ambulatory Visit: Payer: Self-pay | Admitting: Internal Medicine

## 2023-11-07 ENCOUNTER — Ambulatory Visit (HOSPITAL_BASED_OUTPATIENT_CLINIC_OR_DEPARTMENT_OTHER): Admitting: General Surgery

## 2023-11-13 ENCOUNTER — Other Ambulatory Visit: Payer: Self-pay | Admitting: Internal Medicine

## 2023-11-13 ENCOUNTER — Encounter (HOSPITAL_BASED_OUTPATIENT_CLINIC_OR_DEPARTMENT_OTHER): Admitting: General Surgery

## 2023-11-14 ENCOUNTER — Encounter (HOSPITAL_BASED_OUTPATIENT_CLINIC_OR_DEPARTMENT_OTHER): Admitting: General Surgery

## 2023-11-14 DIAGNOSIS — L97811 Non-pressure chronic ulcer of other part of right lower leg limited to breakdown of skin: Secondary | ICD-10-CM | POA: Diagnosis not present

## 2023-11-14 DIAGNOSIS — Z89612 Acquired absence of left leg above knee: Secondary | ICD-10-CM | POA: Diagnosis not present

## 2023-11-14 DIAGNOSIS — E1151 Type 2 diabetes mellitus with diabetic peripheral angiopathy without gangrene: Secondary | ICD-10-CM | POA: Diagnosis not present

## 2023-11-14 DIAGNOSIS — L97812 Non-pressure chronic ulcer of other part of right lower leg with fat layer exposed: Secondary | ICD-10-CM | POA: Diagnosis not present

## 2023-11-14 DIAGNOSIS — E11622 Type 2 diabetes mellitus with other skin ulcer: Secondary | ICD-10-CM | POA: Diagnosis not present

## 2023-11-14 DIAGNOSIS — I890 Lymphedema, not elsewhere classified: Secondary | ICD-10-CM | POA: Diagnosis not present

## 2023-11-21 ENCOUNTER — Encounter (HOSPITAL_BASED_OUTPATIENT_CLINIC_OR_DEPARTMENT_OTHER): Admitting: General Surgery

## 2023-11-21 DIAGNOSIS — L97812 Non-pressure chronic ulcer of other part of right lower leg with fat layer exposed: Secondary | ICD-10-CM | POA: Diagnosis not present

## 2023-11-21 DIAGNOSIS — I890 Lymphedema, not elsewhere classified: Secondary | ICD-10-CM | POA: Diagnosis not present

## 2023-11-21 DIAGNOSIS — E11622 Type 2 diabetes mellitus with other skin ulcer: Secondary | ICD-10-CM | POA: Diagnosis not present

## 2023-11-21 DIAGNOSIS — E1151 Type 2 diabetes mellitus with diabetic peripheral angiopathy without gangrene: Secondary | ICD-10-CM | POA: Diagnosis not present

## 2023-11-21 DIAGNOSIS — Z89612 Acquired absence of left leg above knee: Secondary | ICD-10-CM | POA: Diagnosis not present

## 2023-11-21 DIAGNOSIS — L97811 Non-pressure chronic ulcer of other part of right lower leg limited to breakdown of skin: Secondary | ICD-10-CM | POA: Diagnosis not present

## 2023-11-21 DIAGNOSIS — I872 Venous insufficiency (chronic) (peripheral): Secondary | ICD-10-CM | POA: Diagnosis not present

## 2023-11-23 ENCOUNTER — Other Ambulatory Visit: Payer: Self-pay

## 2023-11-23 DIAGNOSIS — K2270 Barrett's esophagus without dysplasia: Secondary | ICD-10-CM

## 2023-11-23 MED ORDER — TAMSULOSIN HCL 0.4 MG PO CAPS: 0.4000 mg | ORAL_CAPSULE | Freq: Two times a day (BID) | ORAL | 3 refills | Status: AC

## 2023-11-23 MED ORDER — POTASSIUM CHLORIDE CRYS ER 20 MEQ PO TBCR: 20.0000 meq | EXTENDED_RELEASE_TABLET | Freq: Once | ORAL | 2 refills | Status: AC

## 2023-12-04 ENCOUNTER — Ambulatory Visit (HOSPITAL_BASED_OUTPATIENT_CLINIC_OR_DEPARTMENT_OTHER): Admitting: General Surgery

## 2023-12-07 ENCOUNTER — Encounter (HOSPITAL_BASED_OUTPATIENT_CLINIC_OR_DEPARTMENT_OTHER): Admitting: General Surgery

## 2023-12-08 ENCOUNTER — Ambulatory Visit: Admitting: Internal Medicine

## 2023-12-11 ENCOUNTER — Encounter (HOSPITAL_BASED_OUTPATIENT_CLINIC_OR_DEPARTMENT_OTHER): Attending: General Surgery | Admitting: General Surgery

## 2023-12-11 DIAGNOSIS — I890 Lymphedema, not elsewhere classified: Secondary | ICD-10-CM | POA: Diagnosis not present

## 2023-12-11 DIAGNOSIS — L97812 Non-pressure chronic ulcer of other part of right lower leg with fat layer exposed: Secondary | ICD-10-CM | POA: Diagnosis not present

## 2023-12-11 DIAGNOSIS — E11622 Type 2 diabetes mellitus with other skin ulcer: Secondary | ICD-10-CM | POA: Insufficient documentation

## 2023-12-11 DIAGNOSIS — L97811 Non-pressure chronic ulcer of other part of right lower leg limited to breakdown of skin: Secondary | ICD-10-CM | POA: Diagnosis not present

## 2023-12-11 DIAGNOSIS — I872 Venous insufficiency (chronic) (peripheral): Secondary | ICD-10-CM | POA: Insufficient documentation

## 2023-12-11 DIAGNOSIS — Z89612 Acquired absence of left leg above knee: Secondary | ICD-10-CM | POA: Insufficient documentation

## 2023-12-18 ENCOUNTER — Encounter (HOSPITAL_BASED_OUTPATIENT_CLINIC_OR_DEPARTMENT_OTHER): Admitting: General Surgery

## 2023-12-18 DIAGNOSIS — I890 Lymphedema, not elsewhere classified: Secondary | ICD-10-CM | POA: Diagnosis not present

## 2023-12-18 DIAGNOSIS — Z89612 Acquired absence of left leg above knee: Secondary | ICD-10-CM | POA: Diagnosis not present

## 2023-12-18 DIAGNOSIS — I872 Venous insufficiency (chronic) (peripheral): Secondary | ICD-10-CM | POA: Diagnosis not present

## 2023-12-18 DIAGNOSIS — E11622 Type 2 diabetes mellitus with other skin ulcer: Secondary | ICD-10-CM | POA: Diagnosis not present

## 2023-12-18 DIAGNOSIS — L97812 Non-pressure chronic ulcer of other part of right lower leg with fat layer exposed: Secondary | ICD-10-CM | POA: Diagnosis not present

## 2023-12-18 DIAGNOSIS — L97811 Non-pressure chronic ulcer of other part of right lower leg limited to breakdown of skin: Secondary | ICD-10-CM | POA: Diagnosis not present

## 2023-12-26 ENCOUNTER — Ambulatory Visit (HOSPITAL_BASED_OUTPATIENT_CLINIC_OR_DEPARTMENT_OTHER): Admitting: Internal Medicine

## 2023-12-27 ENCOUNTER — Encounter (HOSPITAL_BASED_OUTPATIENT_CLINIC_OR_DEPARTMENT_OTHER): Attending: Internal Medicine | Admitting: Internal Medicine

## 2023-12-27 DIAGNOSIS — E11622 Type 2 diabetes mellitus with other skin ulcer: Secondary | ICD-10-CM | POA: Diagnosis present

## 2023-12-27 DIAGNOSIS — L97812 Non-pressure chronic ulcer of other part of right lower leg with fat layer exposed: Secondary | ICD-10-CM | POA: Diagnosis not present

## 2023-12-27 DIAGNOSIS — L97811 Non-pressure chronic ulcer of other part of right lower leg limited to breakdown of skin: Secondary | ICD-10-CM | POA: Insufficient documentation

## 2023-12-27 DIAGNOSIS — Z89612 Acquired absence of left leg above knee: Secondary | ICD-10-CM | POA: Insufficient documentation

## 2023-12-27 DIAGNOSIS — I890 Lymphedema, not elsewhere classified: Secondary | ICD-10-CM | POA: Insufficient documentation

## 2023-12-27 DIAGNOSIS — I872 Venous insufficiency (chronic) (peripheral): Secondary | ICD-10-CM | POA: Insufficient documentation

## 2024-01-01 ENCOUNTER — Other Ambulatory Visit: Payer: Self-pay | Admitting: Internal Medicine

## 2024-01-01 NOTE — Telephone Encounter (Unsigned)
 Copied from CRM [id number]. Topic: Clinical - Medication Refill >> Jan 01, 2024  3:24 PM Mesmerise C wrote: Medication: ezetimibe  (ZETIA ) 10 MG tablet   Has the patient contacted their pharmacy? Yes (Agent: If no, request that the patient contact the pharmacy for the refill. If patient does not wish to contact the pharmacy document the reason why and proceed with request.) (Agent: If yes, when and what did the pharmacy advise?)  This is the patient's preferred pharmacy:   [id number] - [place], [place] - [id number] [place] [address] Phone: [phone] Fax: [phone]  Is this the correct pharmacy for this prescription? Yes If no, delete pharmacy and type the correct one.   Has the prescription been filled recently? No  Is the patient out of the medication? No  Has the patient been seen for an appointment in the last year OR does the patient have an upcoming appointment? Yes  Can we respond through MyChart? Yes  Agent: Please be advised that Rx refills may take up to 3 business days. We ask that you follow-up with your pharmacy.

## 2024-01-02 ENCOUNTER — Other Ambulatory Visit: Payer: Self-pay

## 2024-01-02 MED ORDER — EZETIMIBE 10 MG PO TABS: 10.0000 mg | ORAL_TABLET | Freq: Every day | ORAL | 3 refills | Status: DC

## 2024-01-03 ENCOUNTER — Encounter (HOSPITAL_BASED_OUTPATIENT_CLINIC_OR_DEPARTMENT_OTHER): Admitting: General Surgery

## 2024-01-03 DIAGNOSIS — E11622 Type 2 diabetes mellitus with other skin ulcer: Secondary | ICD-10-CM | POA: Diagnosis not present

## 2024-01-03 DIAGNOSIS — L97812 Non-pressure chronic ulcer of other part of right lower leg with fat layer exposed: Secondary | ICD-10-CM | POA: Diagnosis not present

## 2024-01-03 DIAGNOSIS — I890 Lymphedema, not elsewhere classified: Secondary | ICD-10-CM | POA: Diagnosis not present

## 2024-01-03 DIAGNOSIS — I872 Venous insufficiency (chronic) (peripheral): Secondary | ICD-10-CM | POA: Diagnosis not present

## 2024-01-05 ENCOUNTER — Telehealth: Payer: Self-pay

## 2024-01-05 NOTE — Telephone Encounter (Signed)
 Pt has appt scheduled.

## 2024-01-05 NOTE — Telephone Encounter (Signed)
 Copied from CRM [id number]. Topic: Appointments - Appointment Scheduling >> Jan 05, 2024 12:00 PM Shereese L wrote: Patient/patient representative is calling to schedule an appointment. Continuous falling due to having 1 leg. Patient stated that he only wants to see Dr. Norleen.

## 2024-01-11 ENCOUNTER — Encounter (HOSPITAL_BASED_OUTPATIENT_CLINIC_OR_DEPARTMENT_OTHER): Admitting: Internal Medicine

## 2024-01-11 DIAGNOSIS — E11622 Type 2 diabetes mellitus with other skin ulcer: Secondary | ICD-10-CM | POA: Diagnosis not present

## 2024-01-17 ENCOUNTER — Ambulatory Visit (HOSPITAL_BASED_OUTPATIENT_CLINIC_OR_DEPARTMENT_OTHER): Admitting: General Surgery

## 2024-01-18 ENCOUNTER — Encounter: Payer: Self-pay | Admitting: Internal Medicine

## 2024-01-18 ENCOUNTER — Ambulatory Visit: Admitting: Internal Medicine

## 2024-01-18 ENCOUNTER — Ambulatory Visit: Payer: Self-pay | Admitting: Internal Medicine

## 2024-01-18 VITALS — BP 142/74 | HR 58 | Temp 98.1°F | Ht 71.0 in

## 2024-01-18 DIAGNOSIS — N1831 Chronic kidney disease, stage 3a: Secondary | ICD-10-CM

## 2024-01-18 DIAGNOSIS — R413 Other amnesia: Secondary | ICD-10-CM | POA: Diagnosis not present

## 2024-01-18 DIAGNOSIS — E1122 Type 2 diabetes mellitus with diabetic chronic kidney disease: Secondary | ICD-10-CM | POA: Diagnosis not present

## 2024-01-18 DIAGNOSIS — Z23 Encounter for immunization: Secondary | ICD-10-CM

## 2024-01-18 DIAGNOSIS — R296 Repeated falls: Secondary | ICD-10-CM | POA: Diagnosis not present

## 2024-01-18 DIAGNOSIS — I10 Essential (primary) hypertension: Secondary | ICD-10-CM | POA: Diagnosis not present

## 2024-01-18 DIAGNOSIS — R531 Weakness: Secondary | ICD-10-CM

## 2024-01-18 DIAGNOSIS — E039 Hypothyroidism, unspecified: Secondary | ICD-10-CM

## 2024-01-18 DIAGNOSIS — E7800 Pure hypercholesterolemia, unspecified: Secondary | ICD-10-CM

## 2024-01-18 LAB — HEPATIC FUNCTION PANEL
ALT: 14 U/L (ref 0–53)
AST: 16 U/L (ref 0–37)
Albumin: 3.7 g/dL (ref 3.5–5.2)
Alkaline Phosphatase: 99 U/L (ref 39–117)
Bilirubin, Direct: 0.2 mg/dL (ref 0.0–0.3)
Total Bilirubin: 0.7 mg/dL (ref 0.2–1.2)
Total Protein: 6.6 g/dL (ref 6.0–8.3)

## 2024-01-18 LAB — LIPID PANEL
Cholesterol: 106 mg/dL (ref 0–200)
HDL: 33.5 mg/dL — ABNORMAL LOW (ref >39.00)
LDL Cholesterol: 50 mg/dL (ref 0–99)
NonHDL: 72.68
Total CHOL/HDL Ratio: 3
Triglycerides: 115 mg/dL (ref 0.0–149.0)
VLDL: 23 mg/dL (ref 0.0–40.0)

## 2024-01-18 LAB — BASIC METABOLIC PANEL WITH GFR
BUN: 19 mg/dL (ref 6–23)
CO2: 30 meq/L (ref 19–32)
Calcium: 9.6 mg/dL (ref 8.4–10.5)
Chloride: 104 meq/L (ref 96–112)
Creatinine, Ser: 1.1 mg/dL (ref 0.40–1.50)
GFR: 63.44 mL/min (ref >60.00)
Glucose, Bld: 132 mg/dL — ABNORMAL HIGH (ref 70–99)
Potassium: 3.8 meq/L (ref 3.5–5.1)
Sodium: 138 meq/L (ref 135–145)

## 2024-01-18 LAB — HEMOGLOBIN A1C: Hgb A1c MFr Bld: 5.9 % (ref 4.6–6.5)

## 2024-01-18 MED ORDER — AMLODIPINE BESYLATE 5 MG PO TABS: 5.0000 mg | ORAL_TABLET | Freq: Every day | ORAL | 3 refills | Status: AC
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
## 2024-01-18 NOTE — Patient Instructions (Addendum)
 You had the flu shot today  Please have your Shingrix (shingles) shots done at your local pharmacy.  Please take all new medication as prescribed - the amlodipine 5 mg per day ([facility] to [facility])  Please continue all other medications as before, and refills have been done if requested.  Please have the pharmacy call with any other refills you may need.  Please continue your efforts at being more active, low cholesterol diet, and weight control.  Please keep your appointments with your specialists as you may have planned - cardiology jan 13, and remember to call for your eye exam as you mentioned  You will be contacted regarding the referral for: Home Health with RN, PT, aide  Please go to the LAB at the blood drawing area for the tests to be done  You will be contacted by phone if any changes need to be made immediately.  Otherwise, you will receive a letter about your results with an explanation, but please check with MyChart first.  Please make an Appointment to return in 6 months, or sooner if needed

## 2024-01-18 NOTE — Progress Notes (Signed)
 The test results show that your current treatment is OK, as the tests are stable.  Please continue the same plan.  There is no other need for change of treatment or further evaluation based on these results, at this time.  thanks

## 2024-01-18 NOTE — Assessment & Plan Note (Addendum)
 With ckd3a  Lab Results  Component Value Date   HGBA1C 5.9 01/18/2024   Stable, pt to continue current medical treatment  - diet, wt control  Lab Results  Component Value Date   CREATININE 1.10 01/18/2024   Stable overall, cont to avoid nephrotoxins

## 2024-01-21 ENCOUNTER — Encounter: Payer: Self-pay | Admitting: Internal Medicine

## 2024-01-21 DIAGNOSIS — R413 Other amnesia: Secondary | ICD-10-CM | POA: Insufficient documentation

## 2024-01-21 NOTE — Assessment & Plan Note (Signed)
 Lab Results  Component Value Date   LDLCALC 50 01/18/2024   Stable, pt to continue current statin lipitor 80 mg every day, zetia  10 mg qd

## 2024-01-21 NOTE — Assessment & Plan Note (Signed)
 With generalized weakness and right knee giveaways  - declines orthopedic, but will have HH with RN, PT and aide, cont walker use

## 2024-01-21 NOTE — Assessment & Plan Note (Signed)
 Recent onset ? [facility] - for  neurology referral

## 2024-01-21 NOTE — Assessment & Plan Note (Signed)
 BP Readings from Last 3 Encounters:  01/18/24 (!) 142/74  09/04/23 134/64  08/17/23 (!) 180/72   uncontrolled, pt to continue medical treatment coreg  6.25 bid, lisinopril  40 every day, but add amlodipine 5 mg qd

## 2024-01-22 ENCOUNTER — Encounter (HOSPITAL_BASED_OUTPATIENT_CLINIC_OR_DEPARTMENT_OTHER): Admitting: General Surgery

## 2024-01-22 DIAGNOSIS — E11622 Type 2 diabetes mellitus with other skin ulcer: Secondary | ICD-10-CM | POA: Diagnosis not present

## 2024-01-22 DIAGNOSIS — I890 Lymphedema, not elsewhere classified: Secondary | ICD-10-CM | POA: Diagnosis not present

## 2024-01-22 DIAGNOSIS — I872 Venous insufficiency (chronic) (peripheral): Secondary | ICD-10-CM | POA: Diagnosis not present

## 2024-01-22 DIAGNOSIS — L97812 Non-pressure chronic ulcer of other part of right lower leg with fat layer exposed: Secondary | ICD-10-CM | POA: Diagnosis not present

## 2024-01-26 ENCOUNTER — Encounter (HOSPITAL_BASED_OUTPATIENT_CLINIC_OR_DEPARTMENT_OTHER): Admitting: General Surgery

## 2024-01-26 DIAGNOSIS — E11622 Type 2 diabetes mellitus with other skin ulcer: Secondary | ICD-10-CM | POA: Diagnosis not present

## 2024-01-29 ENCOUNTER — Encounter: Payer: Self-pay | Admitting: Radiology

## 2024-01-30 ENCOUNTER — Encounter: Payer: Self-pay | Admitting: Physician Assistant

## 2024-01-30 ENCOUNTER — Encounter (HOSPITAL_BASED_OUTPATIENT_CLINIC_OR_DEPARTMENT_OTHER): Attending: General Surgery | Admitting: General Surgery

## 2024-01-30 DIAGNOSIS — E11621 Type 2 diabetes mellitus with foot ulcer: Secondary | ICD-10-CM | POA: Diagnosis not present

## 2024-01-30 DIAGNOSIS — Z89612 Acquired absence of left leg above knee: Secondary | ICD-10-CM | POA: Insufficient documentation

## 2024-01-30 DIAGNOSIS — I872 Venous insufficiency (chronic) (peripheral): Secondary | ICD-10-CM | POA: Diagnosis not present

## 2024-01-30 DIAGNOSIS — L97512 Non-pressure chronic ulcer of other part of right foot with fat layer exposed: Secondary | ICD-10-CM | POA: Diagnosis not present

## 2024-01-30 DIAGNOSIS — L97811 Non-pressure chronic ulcer of other part of right lower leg limited to breakdown of skin: Secondary | ICD-10-CM | POA: Diagnosis not present

## 2024-01-30 DIAGNOSIS — I890 Lymphedema, not elsewhere classified: Secondary | ICD-10-CM | POA: Insufficient documentation

## 2024-01-30 DIAGNOSIS — E11622 Type 2 diabetes mellitus with other skin ulcer: Secondary | ICD-10-CM | POA: Diagnosis not present

## 2024-02-06 ENCOUNTER — Telehealth: Payer: Self-pay

## 2024-02-06 DIAGNOSIS — N1831 Chronic kidney disease, stage 3a: Secondary | ICD-10-CM | POA: Diagnosis not present

## 2024-02-06 DIAGNOSIS — L03115 Cellulitis of right lower limb: Secondary | ICD-10-CM | POA: Diagnosis not present

## 2024-02-06 DIAGNOSIS — I130 Hypertensive heart and chronic kidney disease with heart failure and stage 1 through stage 4 chronic kidney disease, or unspecified chronic kidney disease: Secondary | ICD-10-CM | POA: Diagnosis not present

## 2024-02-06 DIAGNOSIS — E1122 Type 2 diabetes mellitus with diabetic chronic kidney disease: Secondary | ICD-10-CM | POA: Diagnosis not present

## 2024-02-06 DIAGNOSIS — I5032 Chronic diastolic (congestive) heart failure: Secondary | ICD-10-CM | POA: Diagnosis not present

## 2024-02-06 DIAGNOSIS — D631 Anemia in chronic kidney disease: Secondary | ICD-10-CM | POA: Diagnosis not present

## 2024-02-06 NOTE — Telephone Encounter (Signed)
 Copied from CRM [id number]. Topic: Clinical - Home Health Verbal Orders >> Feb 06, 2024  3:48 PM Franky GRADE wrote: Caller/Agency: [facility] Callback Number: [phone] Service Requested: Skilled Nursing Frequency: skilled nursing once a week for 9 weeks unless he dosen't have a wound care appointment then he will be seen twice in that week    Any new concerns about the patient? No

## 2024-02-06 NOTE — Telephone Encounter (Signed)
 Ok for verbal

## 2024-02-07 ENCOUNTER — Encounter (HOSPITAL_BASED_OUTPATIENT_CLINIC_OR_DEPARTMENT_OTHER): Admitting: General Surgery

## 2024-02-07 NOTE — Telephone Encounter (Signed)
 Called and gave verbals.

## 2024-02-08 ENCOUNTER — Telehealth: Payer: Self-pay

## 2024-02-08 NOTE — Telephone Encounter (Signed)
 Ok for [employer]

## 2024-02-08 NOTE — Telephone Encounter (Signed)
 For verbals

## 2024-02-08 NOTE — Telephone Encounter (Signed)
 Copied from CRM #[id number]. Topic: Clinical - Home Health Verbal Orders >> Feb 08, 2024  2:00 PM Macario HERO wrote: Caller/Agency: Rosaline from [facility] Callback Number: [phone] Service Requested: Occupational Therapy Frequency: 1 week 3 - 2 week 2 Any new concerns about the patient? No

## 2024-02-09 ENCOUNTER — Telehealth: Payer: Self-pay

## 2024-02-09 NOTE — Telephone Encounter (Signed)
 Copied from CRM [id number]. Topic: Clinical - Home Health Verbal Orders >> Feb 09, 2024  8:48 AM Rosina BIRCH wrote: Caller/Agency: Amy from [facility] Callback Number: [phone] (may leave message) Service Requested: Physical Therapy Frequency: move the evaluation to next week per patient request Any new concerns about the patient? No

## 2024-02-09 NOTE — Telephone Encounter (Signed)
 Ok for [employer]

## 2024-02-09 NOTE — Telephone Encounter (Signed)
 Called and gave verbals.

## 2024-02-12 NOTE — Telephone Encounter (Signed)
 Called and left voicemail giving verbals.

## 2024-02-13 ENCOUNTER — Other Ambulatory Visit (HOSPITAL_COMMUNITY): Payer: Self-pay

## 2024-02-15 ENCOUNTER — Encounter (HOSPITAL_BASED_OUTPATIENT_CLINIC_OR_DEPARTMENT_OTHER): Admitting: General Surgery

## 2024-02-15 ENCOUNTER — Other Ambulatory Visit: Payer: Self-pay | Admitting: Internal Medicine

## 2024-02-15 ENCOUNTER — Telehealth: Payer: Self-pay

## 2024-02-15 MED ORDER — LORAZEPAM 1 MG PO TABS: ORAL_TABLET | ORAL | 2 refills | Status: DC

## 2024-02-15 NOTE — Telephone Encounter (Signed)
 Please advise on refill.

## 2024-02-15 NOTE — Telephone Encounter (Signed)
 Copied from CRM #[id number]. Topic: Clinical - Medication Refill >> Feb 15, 2024 12:46 PM Suzen RAMAN wrote: Medication: LORazepam  (ATIVAN ) 1 MG tablet   Has the patient contacted their pharmacy? Yes   This is the patient's preferred pharmacy:   [id number] - [place], [place] - [address] [place] [address] Phone: [phone] Fax: [phone]  Is this the correct pharmacy for this prescription? Yes If no, delete pharmacy and type the correct one.   Has the prescription been filled recently? No  Is the patient out of the medication? Yes  Has the patient been seen for an appointment in the last year OR does the patient have an upcoming appointment? Yes  Can we respond through MyChart? Yes  Agent: Please be advised that Rx refills may take up to 3 business days. We ask that you follow-up with your pharmacy.

## 2024-02-15 NOTE — Telephone Encounter (Signed)
 Copied from CRM [id number]. Topic: Clinical - Medical Advice >> Feb 15, 2024 10:45 AM Steven Charles wrote: Reason for CRM: [facility]  They will have to discontinue care for the patient due to a bed bug infestation at his home.  [phone]

## 2024-02-16 NOTE — Telephone Encounter (Signed)
 Ok this is noted, thanks

## 2024-02-19 ENCOUNTER — Other Ambulatory Visit (HOSPITAL_COMMUNITY): Payer: Self-pay

## 2024-02-20 ENCOUNTER — Other Ambulatory Visit (HOSPITAL_COMMUNITY): Payer: Self-pay

## 2024-02-20 MED ORDER — EZETIMIBE 10 MG PO TABS
10.0000 mg | ORAL_TABLET | Freq: Every day | ORAL | 3 refills | Status: AC
  Filled 2024-02-20: qty 90, 90d supply, fill #0

## 2024-02-21 ENCOUNTER — Other Ambulatory Visit (HOSPITAL_COMMUNITY): Payer: Self-pay

## 2024-02-26 ENCOUNTER — Encounter (HOSPITAL_BASED_OUTPATIENT_CLINIC_OR_DEPARTMENT_OTHER): Attending: General Surgery | Admitting: General Surgery

## 2024-02-26 DIAGNOSIS — I872 Venous insufficiency (chronic) (peripheral): Secondary | ICD-10-CM | POA: Insufficient documentation

## 2024-02-26 DIAGNOSIS — Z89612 Acquired absence of left leg above knee: Secondary | ICD-10-CM | POA: Insufficient documentation

## 2024-02-26 DIAGNOSIS — L97811 Non-pressure chronic ulcer of other part of right lower leg limited to breakdown of skin: Secondary | ICD-10-CM | POA: Insufficient documentation

## 2024-02-26 DIAGNOSIS — E11621 Type 2 diabetes mellitus with foot ulcer: Secondary | ICD-10-CM | POA: Diagnosis not present

## 2024-02-26 DIAGNOSIS — I890 Lymphedema, not elsewhere classified: Secondary | ICD-10-CM | POA: Diagnosis not present

## 2024-02-26 DIAGNOSIS — L97512 Non-pressure chronic ulcer of other part of right foot with fat layer exposed: Secondary | ICD-10-CM | POA: Diagnosis not present

## 2024-02-26 DIAGNOSIS — E11622 Type 2 diabetes mellitus with other skin ulcer: Secondary | ICD-10-CM | POA: Diagnosis present

## 2024-03-01 ENCOUNTER — Other Ambulatory Visit (HOSPITAL_COMMUNITY): Payer: Self-pay

## 2024-03-04 ENCOUNTER — Encounter (HOSPITAL_BASED_OUTPATIENT_CLINIC_OR_DEPARTMENT_OTHER): Admitting: Internal Medicine

## 2024-03-05 ENCOUNTER — Encounter (HOSPITAL_BASED_OUTPATIENT_CLINIC_OR_DEPARTMENT_OTHER): Admitting: Internal Medicine

## 2024-03-05 DIAGNOSIS — E11622 Type 2 diabetes mellitus with other skin ulcer: Secondary | ICD-10-CM | POA: Diagnosis not present

## 2024-03-11 ENCOUNTER — Ambulatory Visit: Payer: Self-pay

## 2024-03-11 NOTE — Telephone Encounter (Signed)
 FYI Only or Action Required?: Action required by provider: request for appointment.  Patient was last seen in primary care on 01/18/2024 by Norleen Lynwood ORN, MD.  Called Nurse Triage reporting Sinusitis.  Symptoms began several months ago.  Interventions attempted: OTC medications: Zycam.  Symptoms are: unchanged.  Triage Disposition: No disposition on file.  Patient/caregiver understands and will follow disposition?:   Copied from CRM #[id number]. Topic: Clinical - Red Word Triage >> Mar 11, 2024  3:19 PM Donna BRAVO wrote: Red Word that prompted transfer to Nurse Triage:  asking for antibiotic possible sinus infection has symptoms for a month or more -congestion -head pressure, ear,  -having a hard time breathing sort of breath Reason for Disposition  [1] Sinus pain (not just congestion) AND [2] fever  Answer Assessment - Initial Assessment Questions Sometimes coughing up grey mucous, has been taking zycam. No appointments available in  next 24hrs. Patient told to go to UC but he doesn't want to. Says he will if he has to.  Wants to get scheduled with another Doctor at the same office whenever Dr Norleen leaves.   Chronic Back pain since fall two months ago. Gabapentin  not really working for pain. Ibuprofen  or advil  for pain. He says they have told him had boderline diabetic and also diabetic, he's not sure why if he does have diabetes, why he's not on medication. Having problems with right leg. Open wounds that he is seeing wound care for. Wound care won't come to his house because of bed bugs. Is going once a week for continued care. Looked for resources for patient because he states he has no money or transportation but unable to find any for him.   1. LOCATION: Where does it hurt?      Between eyes  2. ONSET: When did the sinus pain start?  (e.g., hours, days)      2 months ago. Was taking meds at home but can't get better.   4. RECURRENT SYMPTOM: Have you ever had sinus  problems before? If Yes, ask: When was the last time? and What happened that time?      Gets this once or twice a year 5. NASAL CONGESTION: Is the nose blocked? If Yes, ask: Can you open it or must you breathe through your mouth?     Yes 6. NASAL DISCHARGE: Do you have discharge from your nose? If so ask, What color?     grey 7. FEVER: Do you have a fever? If Yes, ask: What is it, how was it measured, and when did it start?      Chills but has not check his temperature.  8. OTHER SYMPTOMS: Do you have any other symptoms? (e.g., sore throat, cough, earache, difficulty breathing)     Cough  Protocols used: Sinus Pain or Congestion-A-AH

## 2024-03-12 ENCOUNTER — Encounter (HOSPITAL_BASED_OUTPATIENT_CLINIC_OR_DEPARTMENT_OTHER): Admitting: General Surgery

## 2024-03-12 ENCOUNTER — Telehealth: Admitting: Adult Health

## 2024-03-12 DIAGNOSIS — J069 Acute upper respiratory infection, unspecified: Secondary | ICD-10-CM

## 2024-03-12 MED ORDER — FLUTICASONE PROPIONATE 50 MCG/ACT NA SUSP: 2.0000 | Freq: Every day | NASAL | 6 refills | Status: AC

## 2024-03-12 NOTE — Telephone Encounter (Signed)
 Ok for [facility] next availalble, but pt can also go to UC if needed sooner.   Thanks!
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
## 2024-03-12 NOTE — Telephone Encounter (Signed)
 Pt has a video visit scheduled today with another provider.

## 2024-03-19 ENCOUNTER — Encounter (HOSPITAL_BASED_OUTPATIENT_CLINIC_OR_DEPARTMENT_OTHER): Admitting: General Surgery

## 2024-03-19 DIAGNOSIS — E11622 Type 2 diabetes mellitus with other skin ulcer: Secondary | ICD-10-CM | POA: Diagnosis not present

## 2024-03-26 ENCOUNTER — Encounter (HOSPITAL_BASED_OUTPATIENT_CLINIC_OR_DEPARTMENT_OTHER): Admitting: General Surgery

## 2024-03-26 DIAGNOSIS — E11622 Type 2 diabetes mellitus with other skin ulcer: Secondary | ICD-10-CM | POA: Diagnosis not present
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
## 2024-04-04 ENCOUNTER — Encounter (HOSPITAL_BASED_OUTPATIENT_CLINIC_OR_DEPARTMENT_OTHER): Attending: General Surgery | Admitting: General Surgery

## 2024-04-04 DIAGNOSIS — E11621 Type 2 diabetes mellitus with foot ulcer: Secondary | ICD-10-CM | POA: Insufficient documentation

## 2024-04-04 DIAGNOSIS — L97512 Non-pressure chronic ulcer of other part of right foot with fat layer exposed: Secondary | ICD-10-CM | POA: Insufficient documentation

## 2024-04-04 DIAGNOSIS — E11622 Type 2 diabetes mellitus with other skin ulcer: Secondary | ICD-10-CM | POA: Diagnosis not present

## 2024-04-04 DIAGNOSIS — Z89612 Acquired absence of left leg above knee: Secondary | ICD-10-CM | POA: Insufficient documentation

## 2024-04-04 DIAGNOSIS — I872 Venous insufficiency (chronic) (peripheral): Secondary | ICD-10-CM | POA: Insufficient documentation

## 2024-04-04 DIAGNOSIS — I890 Lymphedema, not elsewhere classified: Secondary | ICD-10-CM | POA: Insufficient documentation

## 2024-04-04 DIAGNOSIS — L97812 Non-pressure chronic ulcer of other part of right lower leg with fat layer exposed: Secondary | ICD-10-CM | POA: Diagnosis not present

## 2024-04-09 ENCOUNTER — Encounter: Payer: Self-pay | Admitting: Cardiology

## 2024-04-09 ENCOUNTER — Ambulatory Visit: Attending: Cardiology | Admitting: Cardiology

## 2024-04-09 VITALS — BP 142/68 | HR 59 | Ht 71.0 in | Wt 268.0 lb

## 2024-04-09 DIAGNOSIS — E785 Hyperlipidemia, unspecified: Secondary | ICD-10-CM

## 2024-04-09 DIAGNOSIS — I2510 Atherosclerotic heart disease of native coronary artery without angina pectoris: Secondary | ICD-10-CM

## 2024-04-09 DIAGNOSIS — R600 Localized edema: Secondary | ICD-10-CM

## 2024-04-09 DIAGNOSIS — I10 Essential (primary) hypertension: Secondary | ICD-10-CM | POA: Diagnosis not present

## 2024-04-09 NOTE — Patient Instructions (Addendum)
 Medication Instructions:  Continue same medications *If you need a refill on your cardiac medications before your next appointment, please call your pharmacy*  Lab Work: None ordered  Testing/Procedures: None ordered  Follow-Up: At [facility], you and your health needs are our priority.  As part of our continuing mission to provide you with exceptional heart care, our providers are all part of one team.  This team includes your primary Cardiologist (physician) and Advanced Practice Providers or APPs (Physician Assistants and Nurse Practitioners) who all work together to provide you with the care you need, when you need it.  Your next appointment:  1 year    Call in Oct to schedule Jan appointment     Provider:  Unice   We recommend signing up for the patient portal called MyChart.  Sign up information is provided on this After Visit Summary.  MyChart is used to connect with patients for Virtual Visits (Telemedicine).  Patients are able to view lab/test results, encounter notes, upcoming appointments, etc.  Non-urgent messages can be sent to your provider as well.   To learn more about what you can do with MyChart, go to [email or web address].

## 2024-04-11 ENCOUNTER — Encounter (HOSPITAL_BASED_OUTPATIENT_CLINIC_OR_DEPARTMENT_OTHER): Admitting: General Surgery

## 2024-04-11 DIAGNOSIS — E11621 Type 2 diabetes mellitus with foot ulcer: Secondary | ICD-10-CM | POA: Diagnosis not present

## 2024-04-12 ENCOUNTER — Ambulatory Visit: Payer: Self-pay

## 2024-04-12 NOTE — Telephone Encounter (Signed)
 FYI Only or Action Required?: FYI only for provider: ED advised.  Patient was last seen in primary care on 03/12/2024 by Merna Huxley, NP.  Called Nurse Triage reporting Wound Infection.  Symptoms began several months ago.  Interventions attempted: Other: Cream from wound care clinic.  Symptoms are: gradually worsening.  Triage Disposition: Go to ED Now (Notify PCP)  Patient/caregiver understands and will follow disposition?: Yes         Message from [facility] sent at 04/12/2024 11:08 AM EST  Reason for Triage: Pt wife called in to ask for referral for infectious disease. Pt has leg infection in right leg that is turning colors and is black in places and raw in other places. Hot to the touch and some swelling, whatever medication the wound center is using is burning. A lot of pain and off and on fever.   Reason for Disposition  SEVERE pain in the wound  Answer Assessment - Initial Assessment Questions 1. LOCATION: Where is the wound located?      Right leg  2. WOUND APPEARANCE: What does the wound look like?      Discolored with necrosis, being seen by Wound clinic   3. SIZE: If redness is present, ask: What is the size of the red area? (Inches, centimeters, or compare to size of a coin)      Redness noted, raw skin, some drainage  4. SPREAD: What's changed in the last day?  Do you see any red streaks coming from the wound?     No changes, still looks bad per the wife, pain has increased  5. ONSET: When did it start to look infected?      Several months   6. MECHANISM: How did the wound start, what was the cause?     Patient stated he just woke up with it one day.   7. PAIN: Do you have any pain?  If Yes, ask: How bad is the pain?  (e.g., Scale 1-10; mild, moderate, or severe)     Severe 8-10   8. FEVER: Do you have a fever? If Yes, ask: What is your temperature, how was it measured, and when did it start?     Low grade fever, chills  noted  9. OTHER SYMPTOMS: Do you have any other symptoms? (e.g., shaking chills, weakness, rash elsewhere on body)     Chills    Patient's wife called in to triage with complaints of a possible wound infection in right leg, that is discolored. Warm to the touch, spreading redness.  This has been ongoing for a while per the wife.  The wife stated patient was seen by wound care yesterday 1/15. The area is getting worse and not better. Dr. Marolyn is refusing to place infectious disease referral per the wife's request. She stated they aren't listening to her concerns regarding the wound.  For home care, the patient is taking using cream from wound care office, but it burns.   Referred to ED  further evaluation; wife agrees with the plan of care, and will reach out to PCP office, and wound clinic if symptoms worsen or persist  Protocols used: Wound Infection Suspected-A-AH

## 2024-04-14 NOTE — Progress Notes (Incomplete)
 "      Memory Impairment   Steven Charles is a very pleasant 79 y.o. year old RH male with a history of hypertension, hyperlipidemia, CAD status post CABG, anxiety, left parotid and left thyroid  nodule, depression, CKD, left AKA, hypothyroidism, arthritis, prediabetes seen today for evaluation of memory loss. MoCA today is ***. Etiology is ***. Patient is able to participate on ADLs and to to drive without difficulties. Mood is *** . Patient is accompanied by *** who supplement the history.  Follow up in *** months  *** pending on the above results  MRI brain to further evaluate for structural abnormalities and vascular load  Neuropsychological evaluation for further investigate other causes of memory loss including sleep, attention, anxiety, depression among others 4.  Follow left thyroid  and eft parotid nodule at respective specialties Check B12 TSH   Discussed the use of AI scribe software for clinical note transcription with the patient, who gave verbal consent to proceed.  History of Present Illness  Bedbugs    How long did patient have memory difficulties?  For about.  Patient reports some difficulty remembering new information, recent conversations, names. repeats oneself?  Endorsed Disoriented when walking into a room? Denies ***  Leaving objects in unusual places?  Denies.   Wandering behavior? Denies.   Any personality changes, or depression, anxiety? Denies *** Hallucinations or paranoia? Denies.   Seizures? Denies.    Any sleep changes?  Sleeps well *** Does not sleep well. **  frequent nightmares or dream reenactment, other REM behavior or sleepwalking   Sleep apnea? Denies.   Any hygiene concerns?  Denies.   Independent of bathing and dressing? Endorsed  Does the patient need help with medications?  is in charge *** Who is in charge of the finances?  is in charge   *** Any changes in appetite?   Denies. ***   Patient have trouble swallowing?  Denies.   Does the patient  cook? No*** yes, denies forgetting common recipes or kitchen accidents *** Any headaches?  Denies.   Chronic pain? Denies.   Ambulates with difficulty? Denies. ***  Needs a cane*** Needs a walker *** to ambulate for stability.   Recent falls or head injuries? Denies.     Vision changes?  Denies any new issues.  Has a history of*** Any strokelike symptoms? Denies.   Any tremors? Denies. *** Any anosmia? Denies.   Any incontinence of urine? Denies.   Any bowel dysfunction? Denies.      Patient lives with his wife***  History of heavy alcohol  intake? Denies.   History of heavy tobacco use? Denies.   Family history of dementia?   *** with dementia  Does patient drive? No longer drives  *** yes, denies getting lost.***  10th grade   Allergies[1]  Current Outpatient Medications  Medication Instructions   ACCU-CHEK GUIDE TEST test strip 3 times daily PRN   Accu-Chek Softclix Lancets lancets 3 times daily PRN   amLODipine  (NORVASC ) 5 mg, Oral, Daily   ascorbic acid  (VITAMIN C ) 500 mg, 2 times daily   aspirin  EC 81 mg, Oral, Daily   atorvastatin  (LIPITOR) 80 MG tablet TAKE 1 TABLET(80 MG) BY MOUTH DAILY   Blood Glucose Monitoring Suppl DEVI 1 each, Does not apply, 3 times daily PRN, May substitute to any manufacturer covered by patient's insurance.   carvedilol  (COREG ) 6.25 mg, Oral, 2 times daily   Cyanocobalamin  (VITAMIN B12 PO) 500 mg, 2 times daily   docusate sodium  (COLACE)  100 mg, Daily at bedtime   ezetimibe  (ZETIA ) 10 mg, Oral, Daily   ezetimibe  (ZETIA ) 10 mg, Oral, Daily   fexofenadine  (ALLEGRA ) 180 mg, Daily at bedtime   Fish Oil 1,000 mg, 2 times daily   FLUoxetine  (PROZAC ) 40 mg, Oral, Daily   fluticasone  (FLONASE ) 50 MCG/ACT nasal spray 2 sprays, Each Nare, Daily   furosemide  (LASIX ) 40 mg, Oral, Daily   gabapentin  (NEURONTIN ) 300 MG capsule TAKE 1 CAPSULE BY MOUTH 4 TIMES DAILY   ibuprofen  (ADVIL ) 400 mg, Daily PRN   Lancets Misc. (ACCU-CHEK SOFTCLIX LANCET DEV) KIT     lidocaine  (LIDODERM ) 5 % 1 patch, Transdermal, Every 24 hours, Remove & Discard patch within 12 hours or as directed by MD   lisinopril  (ZESTRIL ) 40 mg, Oral, Daily   LORazepam  (ATIVAN ) 1 MG tablet TAKE 1 TABLET(1 MG) BY MOUTH TWICE DAILY AS NEEDED   Multiple Vitamin (MULTIVITAMIN) tablet 1 tablet, Daily   nystatin  (MYCOSTATIN /NYSTOP ) powder Use as directed twice per day as needed   pantoprazole  (PROTONIX ) 40 mg, Oral, 2 times daily   potassium chloride  SA (KLOR-CON  M) 20 MEQ tablet 20 mEq, Oral,  Once   Pyridoxine HCl (VITAMIN B6 PO) Take by mouth.   tamsulosin  (FLOMAX ) 0.4 mg, Oral, 2 times daily   vitamin E 1,000 Units, Daily    VITALS:  There were no vitals filed for this visit.   Neurological Exam      No data to display             11/03/2017    2:52 PM  MMSE - Mini Mental State Exam  Orientation to time 5  Orientation to Place 5  Registration 3  Attention/ Calculation 5  Recall 0  Language- name 2 objects 2  Language- repeat 1  Language- follow 3 step command 3  Language- read & follow direction 1  Write a sentence 1  Copy design 1  Total score 27      Orientation:  Alert and oriented to person, place and not to time***. No aphasia or dysarthria. Fund of knowledge is appropriate. Recent and remote memory impaired.  Attention and concentration are reduced***.  Able to name objects and repeat phrases. *** Delayed recall  /5 .*** Cranial nerves: There is good facial symmetry. Extraocular muscles are intact and visual fields are full to confrontational testing. Speech is fluent and clear. No tongue deviation. Hearing is intact to conversational tone.*** Tone: Tone is good throughout. Sensation: Sensation is intact to light touch.  Vibration is intact at the bilateral big toe.  Coordination: The patient has no difficulty with RAM's or FNF bilaterally. Normal finger to nose  Motor: Strength is 5/5 in the bilateral upper and lower extremities. There is no pronator drift.  There are no fasciculations noted. DTR's: Deep tendon reflexes are 2/4 bilaterally. Gait and Station: The patient is able to ambulate without difficulty. Gait is cautious and narrow. Stride length is normal. ***      Thank you for allowing us  the opportunity to participate in the care of this nice patient. Please do not hesitate to contact us  for any questions or concerns.   Total time spent on today's visit was *** minutes dedicated to this patient today, preparing to see patient, examining the patient, ordering tests and/or medications and counseling the patient, documenting clinical information in the EHR or other health record, independently interpreting results and communicating results to the patient/family, discussing treatment and goals, answering patient's questions and coordinating care.  Cc:  Norleen Lynwood ORN, MD  Camie Sevin 04/14/2024 12:49 PM       [1]  Allergies Allergen Reactions   Nicoderm [Nicotine] Other (See Comments)    Heart rate dropped    Adhesive [Tape] Itching and Rash    Please use paper tape   "

## 2024-04-17 ENCOUNTER — Encounter

## 2024-04-17 ENCOUNTER — Encounter: Payer: Self-pay | Admitting: Physician Assistant

## 2024-04-18 ENCOUNTER — Encounter (HOSPITAL_BASED_OUTPATIENT_CLINIC_OR_DEPARTMENT_OTHER): Admitting: General Surgery

## 2024-04-19 ENCOUNTER — Encounter (HOSPITAL_BASED_OUTPATIENT_CLINIC_OR_DEPARTMENT_OTHER): Admitting: General Surgery

## 2024-04-19 DIAGNOSIS — E11621 Type 2 diabetes mellitus with foot ulcer: Secondary | ICD-10-CM | POA: Diagnosis not present

## 2024-04-23 ENCOUNTER — Other Ambulatory Visit: Payer: Self-pay | Admitting: Internal Medicine

## 2024-04-23 ENCOUNTER — Other Ambulatory Visit: Payer: Self-pay

## 2024-04-23 ENCOUNTER — Other Ambulatory Visit (HOSPITAL_COMMUNITY): Payer: Self-pay

## 2024-04-23 MED ORDER — LORAZEPAM 1 MG PO TABS
ORAL_TABLET | ORAL | 2 refills | Status: AC
  Filled 2024-04-23: qty 60, 30d supply, fill #0

## 2024-04-23 MED ORDER — EZETIMIBE 10 MG PO TABS
10.0000 mg | ORAL_TABLET | Freq: Every day | ORAL | 3 refills | Status: AC
  Filled 2024-04-23: qty 90, 90d supply, fill #0

## 2024-04-23 MED ORDER — GABAPENTIN 300 MG PO CAPS
300.0000 mg | ORAL_CAPSULE | Freq: Four times a day (QID) | ORAL | 1 refills | Status: AC
  Filled 2024-04-23: qty 360, 90d supply, fill #0

## 2024-04-23 NOTE — Telephone Encounter (Signed)
 Copied from CRM #[id number]. Topic: Clinical - Medication Refill >> Apr 23, 2024  3:27 PM Carlyon D wrote: Medication: ezetimibe  (ZETIA ) 10 MG tablet gabapentin  (NEURONTIN ) 300 MG capsule LORazepam  (ATIVAN ) 1 MG tablet   Has the patient contacted their pharmacy? Yes (Agent: If no, request that the patient contact the pharmacy for the refill. If patient does not wish to contact the pharmacy document the reason why and proceed with request.) (Agent: If yes, when and what did the pharmacy advise?)  This is the patient's preferred pharmacy:   [facility]  [address] [place], [place], [place] [address] Phone: [phone] Hours:  Open  Closes 5 PM    Is this the correct pharmacy for this prescription? Yes If no, delete pharmacy and type the correct one.   Has the prescription been filled recently? No  Is the patient out of the medication? Yes  Has the patient been seen for an appointment in the last year OR does the patient have an upcoming appointment? Yes  Can we respond through MyChart? Yes  Agent: Please be advised that Rx refills may take up to 3 business days. We ask that you follow-up with your pharmacy.

## 2024-04-25 ENCOUNTER — Encounter (HOSPITAL_BASED_OUTPATIENT_CLINIC_OR_DEPARTMENT_OTHER): Admitting: General Surgery

## 2024-04-26 ENCOUNTER — Encounter (HOSPITAL_BASED_OUTPATIENT_CLINIC_OR_DEPARTMENT_OTHER): Admitting: General Surgery

## 2024-04-26 DIAGNOSIS — E11621 Type 2 diabetes mellitus with foot ulcer: Secondary | ICD-10-CM | POA: Diagnosis not present

## 2024-04-30 ENCOUNTER — Encounter (HOSPITAL_BASED_OUTPATIENT_CLINIC_OR_DEPARTMENT_OTHER): Admitting: General Surgery

## 2024-05-01 ENCOUNTER — Emergency Department (HOSPITAL_COMMUNITY)

## 2024-05-01 ENCOUNTER — Inpatient Hospital Stay (HOSPITAL_COMMUNITY)
Admission: EM | Admit: 2024-05-01 | Source: Ambulatory Visit | Attending: Internal Medicine | Admitting: Internal Medicine

## 2024-05-01 ENCOUNTER — Encounter (HOSPITAL_BASED_OUTPATIENT_CLINIC_OR_DEPARTMENT_OTHER): Admitting: General Surgery

## 2024-05-01 ENCOUNTER — Other Ambulatory Visit: Payer: Self-pay

## 2024-05-01 ENCOUNTER — Encounter (HOSPITAL_COMMUNITY): Payer: Self-pay

## 2024-05-01 DIAGNOSIS — E039 Hypothyroidism, unspecified: Secondary | ICD-10-CM | POA: Diagnosis not present

## 2024-05-01 DIAGNOSIS — E119 Type 2 diabetes mellitus without complications: Secondary | ICD-10-CM | POA: Diagnosis not present

## 2024-05-01 DIAGNOSIS — Z8679 Personal history of other diseases of the circulatory system: Secondary | ICD-10-CM

## 2024-05-01 DIAGNOSIS — L03115 Cellulitis of right lower limb: Secondary | ICD-10-CM | POA: Diagnosis not present

## 2024-05-01 DIAGNOSIS — E11628 Type 2 diabetes mellitus with other skin complications: Secondary | ICD-10-CM | POA: Diagnosis not present

## 2024-05-01 DIAGNOSIS — E782 Mixed hyperlipidemia: Secondary | ICD-10-CM | POA: Diagnosis not present

## 2024-05-01 DIAGNOSIS — F411 Generalized anxiety disorder: Secondary | ICD-10-CM | POA: Diagnosis present

## 2024-05-01 DIAGNOSIS — L089 Local infection of the skin and subcutaneous tissue, unspecified: Secondary | ICD-10-CM | POA: Diagnosis not present

## 2024-05-01 DIAGNOSIS — I5032 Chronic diastolic (congestive) heart failure: Secondary | ICD-10-CM | POA: Diagnosis not present

## 2024-05-01 DIAGNOSIS — J3089 Other allergic rhinitis: Secondary | ICD-10-CM | POA: Diagnosis not present

## 2024-05-01 DIAGNOSIS — E785 Hyperlipidemia, unspecified: Secondary | ICD-10-CM | POA: Diagnosis present

## 2024-05-01 DIAGNOSIS — J309 Allergic rhinitis, unspecified: Secondary | ICD-10-CM | POA: Diagnosis present

## 2024-05-01 HISTORY — DX: Type 2 diabetes mellitus without complications: E11.9

## 2024-05-01 LAB — CBC WITH DIFFERENTIAL/PLATELET
Abs Immature Granulocytes: 0.06 10*3/uL (ref 0.00–0.07)
Basophils Absolute: 0 10*3/uL (ref 0.0–0.1)
Basophils Relative: 1 %
Eosinophils Absolute: 0.7 10*3/uL — ABNORMAL HIGH (ref 0.0–0.5)
Eosinophils Relative: 9 %
HCT: 37.2 % — ABNORMAL LOW (ref 39.0–52.0)
Hemoglobin: 12.3 g/dL — ABNORMAL LOW (ref 13.0–17.0)
Immature Granulocytes: 1 %
Lymphocytes Relative: 22 %
Lymphs Abs: 1.6 10*3/uL (ref 0.7–4.0)
MCH: 32.1 pg (ref 26.0–34.0)
MCHC: 33.1 g/dL (ref 30.0–36.0)
MCV: 97.1 fL (ref 80.0–100.0)
Monocytes Absolute: 0.7 10*3/uL (ref 0.1–1.0)
Monocytes Relative: 10 %
Neutro Abs: 4.2 10*3/uL (ref 1.7–7.7)
Neutrophils Relative %: 57 %
Platelets: 175 10*3/uL (ref 150–400)
RBC: 3.83 MIL/uL — ABNORMAL LOW (ref 4.22–5.81)
RDW: 13 % (ref 11.5–15.5)
WBC: 7.3 10*3/uL (ref 4.0–10.5)
nRBC: 0 % (ref 0.0–0.2)

## 2024-05-01 LAB — COMPREHENSIVE METABOLIC PANEL WITH GFR
ALT: 17 U/L (ref 0–44)
AST: 34 U/L (ref 15–41)
Albumin: 3.5 g/dL (ref 3.5–5.0)
Alkaline Phosphatase: 128 U/L — ABNORMAL HIGH (ref 38–126)
Anion gap: 8 (ref 5–15)
BUN: 16 mg/dL (ref 8–23)
CO2: 27 mmol/L (ref 22–32)
Calcium: 10 mg/dL (ref 8.9–10.3)
Chloride: 106 mmol/L (ref 98–111)
Creatinine, Ser: 1.16 mg/dL (ref 0.61–1.24)
GFR, Estimated: >60 mL/min
Glucose, Bld: 87 mg/dL (ref 70–99)
Potassium: 4.9 mmol/L (ref 3.5–5.1)
Sodium: 141 mmol/L (ref 135–145)
Total Bilirubin: 0.5 mg/dL (ref 0.0–1.2)
Total Protein: 7 g/dL (ref 6.5–8.1)

## 2024-05-01 LAB — MAGNESIUM: Magnesium: 2.1 mg/dL (ref 1.7–2.4)

## 2024-05-01 LAB — I-STAT CG4 LACTIC ACID, ED: Lactic Acid, Venous: 1.2 mmol/L (ref 0.5–1.9)

## 2024-05-01 LAB — GLUCOSE, CAPILLARY: Glucose-Capillary: 95 mg/dL (ref 70–99)

## 2024-05-01 MED ORDER — VANCOMYCIN HCL 2000 MG/400ML IV SOLN
2000.0000 mg | Freq: Once | INTRAVENOUS | Status: AC
  Administered 2024-05-01: 2000 mg via INTRAVENOUS
  Filled 2024-05-01: qty 400

## 2024-05-01 MED ORDER — VANCOMYCIN HCL IN DEXTROSE 1-5 GM/200ML-% IV SOLN: 1000.0000 mg | Freq: Once | INTRAVENOUS | Status: DC

## 2024-05-01 MED ORDER — OXYCODONE-ACETAMINOPHEN 5-325 MG PO TABS
1.0000 | ORAL_TABLET | Freq: Once | ORAL | Status: AC
  Administered 2024-05-01: 1 via ORAL
  Filled 2024-05-01: qty 1

## 2024-05-01 MED ORDER — ACETAMINOPHEN 650 MG RE SUPP: 650.0000 mg | Freq: Four times a day (QID) | RECTAL | Status: AC | PRN

## 2024-05-01 MED ORDER — FENTANYL CITRATE (PF) 50 MCG/ML IJ SOSY
25.0000 ug | PREFILLED_SYRINGE | INTRAMUSCULAR | Status: AC | PRN
  Administered 2024-05-01: 25 ug via INTRAVENOUS
  Filled 2024-05-01: qty 1

## 2024-05-01 MED ORDER — LACTATED RINGERS IV SOLN: INTRAVENOUS | Status: DC

## 2024-05-01 MED ORDER — PANTOPRAZOLE SODIUM 40 MG PO TBEC
40.0000 mg | DELAYED_RELEASE_TABLET | Freq: Two times a day (BID) | ORAL | Status: AC
  Administered 2024-05-01 – 2024-05-03 (×5): 40 mg via ORAL
  Filled 2024-05-01 (×5): qty 1

## 2024-05-01 MED ORDER — MELATONIN 3 MG PO TABS
3.0000 mg | ORAL_TABLET | Freq: Every evening | ORAL | Status: AC | PRN
  Administered 2024-05-01 – 2024-05-03 (×3): 3 mg via ORAL
  Filled 2024-05-01 (×3): qty 1

## 2024-05-01 MED ORDER — LORAZEPAM 0.5 MG PO TABS: 0.5000 mg | ORAL_TABLET | Freq: Two times a day (BID) | ORAL | Status: AC | PRN

## 2024-05-01 MED ORDER — AMLODIPINE BESYLATE 5 MG PO TABS
5.0000 mg | ORAL_TABLET | Freq: Every day | ORAL | Status: DC
  Filled 2024-05-01: qty 1

## 2024-05-01 MED ORDER — VANCOMYCIN HCL 1500 MG/300ML IV SOLN
1500.0000 mg | INTRAVENOUS | Status: DC
  Administered 2024-05-02: 1500 mg via INTRAVENOUS
  Filled 2024-05-01: qty 300

## 2024-05-01 MED ORDER — ACETAMINOPHEN 325 MG PO TABS: 650.0000 mg | ORAL_TABLET | Freq: Four times a day (QID) | ORAL | Status: AC | PRN

## 2024-05-01 MED ORDER — EZETIMIBE 10 MG PO TABS
10.0000 mg | ORAL_TABLET | Freq: Every day | ORAL | Status: AC
  Administered 2024-05-01 – 2024-05-03 (×3): 10 mg via ORAL
  Filled 2024-05-01 (×3): qty 1

## 2024-05-01 MED ORDER — PROCHLORPERAZINE EDISYLATE 10 MG/2ML IJ SOLN: 10.0000 mg | Freq: Four times a day (QID) | INTRAMUSCULAR | Status: AC | PRN

## 2024-05-01 MED ORDER — LORATADINE 10 MG PO TABS
10.0000 mg | ORAL_TABLET | Freq: Every day | ORAL | Status: AC
  Administered 2024-05-02 – 2024-05-03 (×2): 10 mg via ORAL
  Filled 2024-05-01 (×2): qty 1

## 2024-05-01 MED ORDER — FLUTICASONE PROPIONATE 50 MCG/ACT NA SUSP
2.0000 | Freq: Every day | NASAL | Status: AC
  Administered 2024-05-02 – 2024-05-03 (×2): 2 via NASAL
  Filled 2024-05-01: qty 16

## 2024-05-01 MED ORDER — INSULIN ASPART 100 UNIT/ML IJ SOLN
0.0000 [IU] | Freq: Three times a day (TID) | INTRAMUSCULAR | Status: AC
  Filled 2024-05-01: qty 1

## 2024-05-01 MED ORDER — SODIUM CHLORIDE 0.9 % IV SOLN: 3.0000 g | Freq: Once | INTRAVENOUS | Status: DC

## 2024-05-01 MED ORDER — ONDANSETRON HCL 4 MG/2ML IJ SOLN: 4.0000 mg | Freq: Four times a day (QID) | INTRAMUSCULAR | Status: AC | PRN

## 2024-05-01 MED ORDER — ENOXAPARIN SODIUM 40 MG/0.4ML IJ SOSY
40.0000 mg | PREFILLED_SYRINGE | INTRAMUSCULAR | Status: AC
  Administered 2024-05-01 – 2024-05-03 (×3): 40 mg via SUBCUTANEOUS
  Filled 2024-05-01 (×3): qty 0.4

## 2024-05-01 MED ORDER — SODIUM CHLORIDE 0.9 % IV SOLN
3.0000 g | Freq: Four times a day (QID) | INTRAVENOUS | Status: AC
  Administered 2024-05-01 – 2024-05-03 (×8): 3 g via INTRAVENOUS
  Filled 2024-05-01 (×11): qty 8

## 2024-05-01 MED ORDER — FLUOXETINE HCL 20 MG PO CAPS
40.0000 mg | ORAL_CAPSULE | Freq: Every day | ORAL | Status: AC
  Administered 2024-05-03: 40 mg via ORAL
  Filled 2024-05-01 (×2): qty 2

## 2024-05-01 MED ORDER — ATORVASTATIN CALCIUM 40 MG PO TABS
80.0000 mg | ORAL_TABLET | Freq: Every day | ORAL | Status: AC
  Administered 2024-05-03: 80 mg via ORAL
  Filled 2024-05-01 (×2): qty 2

## 2024-05-01 MED ORDER — OXYCODONE-ACETAMINOPHEN 5-325 MG PO TABS
1.0000 | ORAL_TABLET | Freq: Four times a day (QID) | ORAL | Status: DC | PRN
  Administered 2024-05-02 (×2): 1 via ORAL
  Filled 2024-05-01 (×2): qty 1

## 2024-05-01 MED ORDER — NALOXONE HCL 0.4 MG/ML IJ SOLN: 0.4000 mg | INTRAMUSCULAR | Status: AC | PRN

## 2024-05-01 MED ORDER — ASPIRIN 81 MG PO TBEC
81.0000 mg | DELAYED_RELEASE_TABLET | Freq: Every day | ORAL | Status: AC
  Administered 2024-05-02 – 2024-05-03 (×2): 81 mg via ORAL
  Filled 2024-05-01 (×2): qty 1

## 2024-05-01 MED ORDER — INSULIN ASPART 100 UNIT/ML IJ SOLN: 0.0000 [IU] | Freq: Every day | INTRAMUSCULAR | Status: AC

## 2024-05-01 MED ORDER — SODIUM CHLORIDE 0.9 % IV SOLN
3.0000 g | Freq: Once | INTRAVENOUS | Status: AC
  Administered 2024-05-01: 3 g via INTRAVENOUS
  Filled 2024-05-01: qty 8

## 2024-05-01 MED ORDER — GABAPENTIN 300 MG PO CAPS
300.0000 mg | ORAL_CAPSULE | Freq: Four times a day (QID) | ORAL | Status: AC
  Administered 2024-05-01 – 2024-05-03 (×9): 300 mg via ORAL
  Filled 2024-05-01 (×9): qty 1

## 2024-05-01 MED ORDER — DOCUSATE SODIUM 100 MG PO CAPS
100.0000 mg | ORAL_CAPSULE | Freq: Every day | ORAL | Status: AC
  Administered 2024-05-01 – 2024-05-03 (×3): 100 mg via ORAL
  Filled 2024-05-01 (×3): qty 1

## 2024-05-01 NOTE — ED Provider Notes (Signed)
 "  EMERGENCY DEPARTMENT AT [facility] Provider Note   CSN: [id number] Arrival date & time: 05/01/24  1647     Patient presents with: Wound Infection   Steven Charles is a 79 y.o. male.   79 year old male with history of diabetes, also left above knee amputation presents emergency room with concern for right foot infection.  Patient is followed by wound care, went today for recheck and debridement and was sent to the ER for admission for IV antibiotics. Previously on Doxy and Levaquin, currently on Cipro .       Prior to Admission medications  Medication Sig Start Date End Date Taking? Authorizing Provider  amLODipine  (NORVASC ) 5 MG tablet Take 1 tablet (5 mg total) by mouth daily. 01/18/24   Norleen Lynwood ORN, MD  Ascorbic Acid  (VITAMIN C ) 500 MG tablet Take 500 mg by mouth 2 (two) times daily.    [provider]  aspirin  81 MG tablet Take 1 tablet (81 mg total) by mouth daily. 04/19/19   Pietro Redell RAMAN, MD  atorvastatin  (LIPITOR) 80 MG tablet TAKE 1 TABLET(80 MG) BY MOUTH DAILY 10/10/23   Norleen Lynwood ORN, MD  carvedilol  (COREG ) 6.25 MG tablet TAKE 1 TABLET BY MOUTH TWICE  DAILY 11/01/23   Norleen Lynwood ORN, MD  Cyanocobalamin  (VITAMIN B12 PO) Take 500 mg by mouth in the morning and at bedtime.    [provider]  docusate sodium  (COLACE) 100 MG capsule Take 100 mg by mouth at bedtime.    [provider]  ezetimibe  (ZETIA ) 10 MG tablet Take 1 tablet (10 mg total) by mouth daily. 01/02/24   Norleen Lynwood ORN, MD  ezetimibe  (ZETIA ) 10 MG tablet Take 1 tablet (10 mg total) by mouth daily. 04/23/24   Norleen Lynwood ORN, MD  fexofenadine  (ALLEGRA ) 180 MG tablet Take 180 mg by mouth at bedtime.    [provider]  FLUoxetine  (PROZAC ) 40 MG capsule TAKE 1 CAPSULE BY MOUTH DAILY 11/01/23   Norleen Lynwood ORN, MD  fluticasone  (FLONASE ) 50 MCG/ACT nasal spray Place 2 sprays into both nostrils daily. 03/12/24   Nafziger, Darleene, NP  furosemide  (LASIX ) 20 MG tablet Take 2  tablets (40 mg total) by mouth daily. 08/17/23   Pietro Redell RAMAN, MD  gabapentin  (NEURONTIN ) 300 MG capsule Take 1 capsule (300 mg total) by mouth 4 (four) times daily. 04/23/24   Norleen Lynwood ORN, MD  ibuprofen  (ADVIL ,MOTRIN ) 400 MG tablet Take 400 mg by mouth daily as needed for headache or moderate pain.    [provider]  lisinopril  (ZESTRIL ) 40 MG tablet Take 1 tablet (40 mg total) by mouth daily. 08/17/23   Pietro Redell RAMAN, MD  LORazepam  (ATIVAN ) 1 MG tablet TAKE 1 TABLET(1 MG) BY MOUTH TWICE DAILY AS NEEDED 04/23/24   Norleen Lynwood ORN, MD  Multiple Vitamin (MULTIVITAMIN) tablet Take 1 tablet by mouth daily.    [provider]  nystatin  (MYCOSTATIN /NYSTOP ) powder Use as directed twice per day as needed 08/10/23   Norleen Lynwood ORN, MD  Omega-3 Fatty Acids (FISH OIL) 1000 MG CAPS Take 1,000 mg by mouth 2 (two) times daily.  Patient taking differently: Take 1,000 mg by mouth every morning.    [provider]  pantoprazole  (PROTONIX ) 40 MG tablet TAKE 1 TABLET BY MOUTH TWICE  DAILY 11/15/23   Norleen Lynwood ORN, MD  potassium chloride  SA (KLOR-CON  M) 20 MEQ tablet Take 1 tablet (20 mEq total) by mouth once for 1 dose. Patient taking differently:  Take 20 mEq by mouth 2 (two) times daily. 11/23/23 04/09/24  Norleen Lynwood ORN, MD  tamsulosin  (FLOMAX ) 0.4 MG CAPS capsule Take 1 capsule (0.4 mg total) by mouth 2 (two) times daily. 11/23/23   Norleen Lynwood ORN, MD  vitamin E 1000 UNIT capsule Take 1,000 Units by mouth daily.    [provider]    Allergies: Nicoderm [nicotine] and Adhesive [tape]    Review of Systems Negative except as per HPI Updated Vital Signs BP (!) 158/95 (BP Location: Left Arm)   Pulse 66   Temp 97.7 F (36.5 C) (Oral)   Resp 16   Ht 5' 11 (1.803 m)   Wt 121.1 kg   SpO2 96%   BMI 37.24 kg/m   Physical Exam Vitals and nursing note reviewed.  Constitutional:      General: He is not in acute distress.    Appearance: He is well-developed. He is not  diaphoretic.  HENT:     Head: Normocephalic and atraumatic.  Pulmonary:     Effort: Pulmonary effort is normal.  Musculoskeletal:     Right lower leg: Edema present.  Skin:    General: Skin is warm.     Findings: Erythema present.  Neurological:     Mental Status: He is alert and oriented to person, place, and time.  Psychiatric:        Behavior: Behavior normal.             (all labs ordered are listed, but only abnormal results are displayed) Labs Reviewed  CBC WITH DIFFERENTIAL/PLATELET - Abnormal; Notable for the following components:      Result Value   RBC 3.83 (*)    Hemoglobin 12.3 (*)    HCT 37.2 (*)    Eosinophils Absolute 0.7 (*)    All other components within normal limits  COMPREHENSIVE METABOLIC PANEL WITH GFR - Abnormal; Notable for the following components:   Alkaline Phosphatase 128 (*)    All other components within normal limits  CULTURE, BLOOD (ROUTINE X 2)  CULTURE, BLOOD (ROUTINE X 2)  I-STAT CG4 LACTIC ACID, ED    EKG: None  Radiology: DG Foot Complete Right Result Date: 05/01/2024 CLINICAL DATA:  Diabetic foot infection.  Right foot wound. EXAM: RIGHT FOOT COMPLETE - 3+ VIEW COMPARISON:  Ankle radiograph earlier today FINDINGS: The bones are subjectively under mineralized. Hammertoe deformity of the toes. No fracture. No erosive or bony destructive change. Diffuse subcutaneous edema and soft tissue thickening. Site of wound is not well delineated by radiograph. No radiopaque foreign body or tracking soft tissue gas. IMPRESSION: Diffuse subcutaneous edema and soft tissue thickening. No radiographic findings of osteomyelitis. Electronically Signed   By: Andrea Gasman M.D.   On: 05/01/2024 18:58   DG Ankle Complete Right Result Date: 05/01/2024 EXAM: 3 OR MORE VIEW(S) XRAY OF THE RIGHT ANKLE 05/01/2024 05:34:00 PM CLINICAL HISTORY: Wound evaluation. COMPARISON: None available. FINDINGS: BONES AND JOINTS: No acute fracture. No malalignment. No  radiographic evidence of osteomyelitis. Small superior and inferior calcaneal spurs. Mild joint space narrowing at the tibiotalar joint consistent with mild osteoarthritis. SOFT TISSUES: Diffuse subcutaneous edema. IMPRESSION: 1. Diffuse subcutaneous edema. 2. No radiographic evidence of osteomyelitis. Electronically signed by: Norman Gatlin MD 05/01/2024 05:56 PM EST RP Workstation: [id number]     Procedures   Medications Ordered in the ED  Ampicillin -Sulbactam (UNASYN ) 3 g in sodium chloride  0.9 % 100 mL IVPB (3 g Intravenous New Bag/Given 05/01/24 1845)    And  vancomycin  (  VANCOREADY) IVPB 2000 mg/400 mL (2,000 mg Intravenous New Bag/Given 05/01/24 1935)  oxyCODONE -acetaminophen  (PERCOCET/ROXICET) 5-325 MG per tablet 1 tablet (1 tablet Oral Given 05/01/24 1858)                                    Medical Decision Making  This patient presents to the ED for concern of right foot infection, this involves an extensive number of treatment options, and is a complaint that carries with it a high risk of complications and morbidity.  The differential diagnosis includes osteomyelitis, cellulitis    Co morbidities / Chronic conditions that complicate the patient evaluation  Diabetes, HLD, HTN, GERD, left AKA   Additional history obtained:  Additional history obtained from EMR External records from outside source obtained and reviewed including note from wound care for admission for IV abx   Lab Tests:  I Ordered, and personally interpreted labs.  The pertinent results include:  lactic acid WNL. CBC and CMP without significant findings.    Imaging Studies ordered:  I ordered imaging studies including XR right ankle, right foot  I independently visualized and interpreted imaging which showed no evidence of osteomyelitis  I agree with the radiologist interpretation   Problem List / ED Course / Critical interventions / Medication management  79 year old male with complex medical history  including left above-knee amputation presents with concern for right foot infection.  Patient has been going to wound care for several months, has been on multiple rounds of oral antibiotics.  Sent in today by his wound care MD for admission for IV antibiotics for outpatient failure.  X-ray negative for osteomyelitis.  CBC with normal WBC.  Lactic acid negative.  Admitted to hospital service with IV antibiotics. I ordered medication including unasyn  and vanc   Reevaluation of the patient after these medicines showed that the patient stable I have reviewed the patients home medicines and have made adjustments as needed   Consultations Obtained:  I requested consultation with the hospitalist, Dr. Marcene,  and discussed lab and imaging findings as well as pertinent plan - they recommend: will consult for admission   Social Determinants of Health:  Has PCP   Test / Admission - Considered:  admit      Final diagnoses:  None    ED Discharge Orders     None          Beverley Leita LABOR, PA-C 05/01/24 1953    Emil Share, DO 05/01/24 2028  "

## 2024-05-01 NOTE — ED Provider Triage Note (Signed)
 Emergency Medicine Provider Triage Evaluation Note  Steven Charles , a 79 y.o. male  was evaluated in triage.  Pt complains of wound evaluation. Patient has been followed by wound care for dressing changes for poor healing wound to the right lower leg. They are concerned for progressive and worsening infection. Unclear if he has been on any antibiotics during this course of time since being managed by wound care intermittently due to transportation concerns and difficulty getting home health to him.  Review of Systems  Positive: As above Negative: As above  Physical Exam  BP (!) 158/95 (BP Location: Left Arm)   Pulse 66   Temp 97.7 F (36.5 C) (Oral)   Resp 16   Ht 5' 11 (1.803 m)   Wt 121.1 kg   SpO2 96%   BMI 37.24 kg/m  Gen:   Awake, no distress   Resp:  Normal effort  MSK:   Moves extremities without difficulty  Other:  Bright red right lower leg from the mid calf with wound extending through the foot and onto the right toes 1-4.   Medical Decision Making  Medically screening exam initiated at 5:15 PM.  Appropriate orders placed.  Steven Charles was informed that the remainder of the evaluation will be completed by another provider, this initial triage assessment does not replace that evaluation, and the importance of remaining in the ED until their evaluation is complete.     Steven Charles A, PA-C 05/01/24 1719

## 2024-05-01 NOTE — ED Notes (Signed)
 Pt to ED C/O chronic right foot wound. Pt followed by wound care, evaluated today and told to come to ED for further evaluation. Pt foot is weeping, swollen, red. Pt reports  foot is burning like a match

## 2024-05-01 NOTE — ED Notes (Signed)
 ED-PA to assess pt

## 2024-05-01 NOTE — Progress Notes (Signed)
 Pharmacy Antibiotic Note  Steven Charles is a 79 y.o. male admitted on 05/01/2024 with RLE cellulitis, diabetic foot infection.  Pharmacy has been consulted for Unasyn  and Vancomycin  dosing.  Plan: Unasyn  3g IV q6h  Vancomycin  2g x1 then 1500 mg IV q24h  (SCr 1.16, Vd 0.5, est AUC 502) Measure Vanc levels as needed.  Goal AUC = 400 - 550 Follow up renal function, culture results, and clinical course.   Height: 5' 11 (180.3 cm) Weight: 121.1 kg (267 lb) IBW/kg (Calculated) : 75.3  Temp (24hrs), Avg:97.7 F (36.5 C), Min:97.7 F (36.5 C), Max:97.7 F (36.5 C)  Recent Labs  Lab 05/01/24 1802 05/01/24 1816  WBC 7.3  --   CREATININE 1.16  --   LATICACIDVEN  --  1.2    Estimated Creatinine Clearance: 67.2 mL/min (by C-G formula based on SCr of 1.16 mg/dL).    Allergies[1]  Antimicrobials this admission: 2/4 Unasyn  >> 2/4 Vancomycin  >>   Dose adjustments this admission:   Microbiology results: 2/4 BCx:   Thank you for allowing pharmacy to be a part of this patients care.  Wanda Hasting PharmD, BCPS [facility] main pharmacy [phone] 05/01/2024 8:17 PM     [1]  Allergies Allergen Reactions   Nicoderm [Nicotine] Other (See Comments)    Heart rate dropped    Adhesive [Tape] Itching and Rash    Please use paper tape

## 2024-05-01 NOTE — H&P (Signed)
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                       #) Allergic Rhinitis: documented h/o such, on scheduled intranasal Flonase  as well as Allegra  as an outpatient.    Plan: cont home Flonase  and home Allegra .           DVT prophylaxis: Lovenox  40 mg SQ daily Code Status: Full code Family Communication: none Disposition Plan: Per Rounding Team Consults called: none ;  Admission status: inpatient    I SPENT GREATER THAN 75  MINUTES IN CLINICAL CARE TIME/MEDICAL DECISION-MAKING IN COMPLETING THIS ADMISSION.     Eva NOVAK Charvi Gammage DO [facility] From 7PM - 7AM   05/01/2024, 8:09 PM         [1]  Allergies Allergen Reactions   Nicoderm [Nicotine] Other (See Comments)    Heart rate dropped    Adhesive [Tape] Itching and Rash    Please use paper tape   "

## 2024-05-02 LAB — SEDIMENTATION RATE: Sed Rate: 21 mm/h — ABNORMAL HIGH (ref 0–16)

## 2024-05-02 LAB — GLUCOSE, CAPILLARY
Glucose-Capillary: 117 mg/dL — ABNORMAL HIGH (ref 70–99)
Glucose-Capillary: 111 mg/dL — ABNORMAL HIGH (ref 70–99)
Glucose-Capillary: 123 mg/dL — ABNORMAL HIGH (ref 70–99)
Glucose-Capillary: 116 mg/dL — ABNORMAL HIGH (ref 70–99)

## 2024-05-02 LAB — CBC WITH DIFFERENTIAL/PLATELET
Abs Immature Granulocytes: 0.08 10*3/uL — ABNORMAL HIGH (ref 0.00–0.07)
Basophils Absolute: 0 10*3/uL (ref 0.0–0.1)
Basophils Relative: 0 %
Eosinophils Absolute: 0.7 10*3/uL — ABNORMAL HIGH (ref 0.0–0.5)
Eosinophils Relative: 7 %
HCT: 33.6 % — ABNORMAL LOW (ref 39.0–52.0)
Hemoglobin: 11.4 g/dL — ABNORMAL LOW (ref 13.0–17.0)
Immature Granulocytes: 1 %
Lymphocytes Relative: 16 %
Lymphs Abs: 1.5 10*3/uL (ref 0.7–4.0)
MCH: 32.6 pg (ref 26.0–34.0)
MCHC: 33.9 g/dL (ref 30.0–36.0)
MCV: 96 fL (ref 80.0–100.0)
Monocytes Absolute: 0.9 10*3/uL (ref 0.1–1.0)
Monocytes Relative: 9 %
Neutro Abs: 6.4 10*3/uL (ref 1.7–7.7)
Neutrophils Relative %: 67 %
Platelets: 176 10*3/uL (ref 150–400)
RBC: 3.5 MIL/uL — ABNORMAL LOW (ref 4.22–5.81)
RDW: 12.9 % (ref 11.5–15.5)
WBC: 9.6 10*3/uL (ref 4.0–10.5)
nRBC: 0 % (ref 0.0–0.2)

## 2024-05-02 LAB — MAGNESIUM: Magnesium: 2 mg/dL (ref 1.7–2.4)

## 2024-05-02 LAB — COMPREHENSIVE METABOLIC PANEL WITH GFR
ALT: 13 U/L (ref 0–44)
AST: 21 U/L (ref 15–41)
Albumin: 3.1 g/dL — ABNORMAL LOW (ref 3.5–5.0)
Alkaline Phosphatase: 117 U/L (ref 38–126)
Anion gap: 9 (ref 5–15)
BUN: 14 mg/dL (ref 8–23)
CO2: 25 mmol/L (ref 22–32)
Calcium: 9.7 mg/dL (ref 8.9–10.3)
Chloride: 105 mmol/L (ref 98–111)
Creatinine, Ser: 1.07 mg/dL (ref 0.61–1.24)
GFR, Estimated: >60 mL/min
Glucose, Bld: 111 mg/dL — ABNORMAL HIGH (ref 70–99)
Potassium: 3.7 mmol/L (ref 3.5–5.1)
Sodium: 139 mmol/L (ref 135–145)
Total Bilirubin: 0.7 mg/dL (ref 0.0–1.2)
Total Protein: 6.1 g/dL — ABNORMAL LOW (ref 6.5–8.1)

## 2024-05-02 LAB — HEMOGLOBIN A1C
Hgb A1c MFr Bld: 5.5 % (ref 4.8–5.6)
Mean Plasma Glucose: 111.15 mg/dL

## 2024-05-02 LAB — T4, FREE: Free T4: 0.98 ng/dL (ref 0.80–2.00)

## 2024-05-02 LAB — C-REACTIVE PROTEIN: CRP: <0.5 mg/dL

## 2024-05-02 LAB — PRO BRAIN NATRIURETIC PEPTIDE: Pro Brain Natriuretic Peptide: 654 pg/mL — ABNORMAL HIGH

## 2024-05-02 LAB — TSH: TSH: 7.38 u[IU]/mL — ABNORMAL HIGH (ref 0.350–4.500)

## 2024-05-02 LAB — PHOSPHORUS: Phosphorus: 2.6 mg/dL (ref 2.5–4.6)

## 2024-05-02 MED ORDER — OXYCODONE-ACETAMINOPHEN 5-325 MG PO TABS
1.0000 | ORAL_TABLET | ORAL | Status: AC | PRN
  Filled 2024-05-02: qty 1

## 2024-05-02 NOTE — Consult Note (Signed)
 3 CLINICAL SUPPORT TEAM - WOUND OSTOMY AND CONTINENCE TEAM  CONSULTATION SERVICES   WOC Nurse-Inpatient Note    WOC Nurse Consult Note:  WOC consult performed remotely utilizing imaging and chart review  Reason for Consult: RLE wound Wound type: full and partial thickness wounds to RLE involving back of LE and extending to plantar aspect of foot as well as 1st through 4th toes. Chart review indicates patient follows at the outpatient wound center, notes reviewed and notes patient with copious amount of drainage at last visit with blue green tinge.  Imaging from hospitalization review and drainage is visibile in old abd pad, serosanguinous in appearance.  Pressure Injury POA: NA- not pressure Measurement:  see nursing flow sheets Wound bed: 1005 red, moist Drainage (amount, consistency, odor) serosanguinous Periwound: edema and erythema, noted patient is on IV antibiotics this admission for treatment of cellulitis Dressing procedure/placement/frequency:  RLE: Cleanse wounds with Vashe ( WD# [id number]) allow to air dry.  Place Aquacel AG (WD# [phone]) over wound bed as well as weave between the toes.  Cover with ABD pad and wrap with Kerlix and ace wrap in a spiral fashion. Change daily and PRN soiling.        WOC team will not follow at this time, please re consult if new needs arise or wound deteriorates.  Thank you,  Doyal Polite, MSN, RN, [facility] WOC Team [phone] (Available Mon-Fri 0700-1500)
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
## 2024-05-02 NOTE — Hospital Course (Addendum)
 79 y.o. M with DM, L AKA, dCHF, CAD s/p CABG 2007, HTN, and hypothyroidism who was sent from clinic for failure of outpatient antibiotics for cellulitis of the right foot.     Cellulitis   Diabetes   Chronic diastolic CHF Coronary artery disease Hypertension   Hypothyroidism   Anemia
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
## 2024-05-03 LAB — CULTURE, BLOOD (ROUTINE X 2)
Culture: NO GROWTH
Culture: NO GROWTH
Special Requests: ADEQUATE

## 2024-05-03 LAB — COMPREHENSIVE METABOLIC PANEL WITH GFR
ALT: 10 U/L (ref 0–44)
AST: 18 U/L (ref 15–41)
Albumin: 3 g/dL — ABNORMAL LOW (ref 3.5–5.0)
Alkaline Phosphatase: 105 U/L (ref 38–126)
Anion gap: 6 (ref 5–15)
BUN: 14 mg/dL (ref 8–23)
CO2: 27 mmol/L (ref 22–32)
Calcium: 9.6 mg/dL (ref 8.9–10.3)
Chloride: 105 mmol/L (ref 98–111)
Creatinine, Ser: 1.29 mg/dL — ABNORMAL HIGH (ref 0.61–1.24)
GFR, Estimated: 56 mL/min — ABNORMAL LOW
Glucose, Bld: 115 mg/dL — ABNORMAL HIGH (ref 70–99)
Potassium: 3.9 mmol/L (ref 3.5–5.1)
Sodium: 139 mmol/L (ref 135–145)
Total Bilirubin: 0.5 mg/dL (ref 0.0–1.2)
Total Protein: 5.9 g/dL — ABNORMAL LOW (ref 6.5–8.1)

## 2024-05-03 LAB — GLUCOSE, CAPILLARY
Glucose-Capillary: 120 mg/dL — ABNORMAL HIGH (ref 70–99)
Glucose-Capillary: 115 mg/dL — ABNORMAL HIGH (ref 70–99)
Glucose-Capillary: 112 mg/dL — ABNORMAL HIGH (ref 70–99)
Glucose-Capillary: 135 mg/dL — ABNORMAL HIGH (ref 70–99)

## 2024-05-03 LAB — CBC
HCT: 32.8 % — ABNORMAL LOW (ref 39.0–52.0)
Hemoglobin: 10.8 g/dL — ABNORMAL LOW (ref 13.0–17.0)
MCH: 32.3 pg (ref 26.0–34.0)
MCHC: 32.9 g/dL (ref 30.0–36.0)
MCV: 98.2 fL (ref 80.0–100.0)
Platelets: 139 10*3/uL — ABNORMAL LOW (ref 150–400)
RBC: 3.34 MIL/uL — ABNORMAL LOW (ref 4.22–5.81)
RDW: 12.8 % (ref 11.5–15.5)
WBC: 7.1 10*3/uL (ref 4.0–10.5)
nRBC: 0 % (ref 0.0–0.2)

## 2024-05-03 MED ORDER — AMLODIPINE BESYLATE 5 MG PO TABS
5.0000 mg | ORAL_TABLET | Freq: Every day | ORAL | Status: AC
  Administered 2024-05-03: 5 mg via ORAL
  Filled 2024-05-03: qty 1

## 2024-05-03 MED ORDER — LINEZOLID 600 MG/300ML IV SOLN
600.0000 mg | Freq: Two times a day (BID) | INTRAVENOUS | Status: AC
  Administered 2024-05-03: 600 mg via INTRAVENOUS
  Filled 2024-05-03 (×2): qty 300

## 2024-05-03 MED ORDER — CARVEDILOL 6.25 MG PO TABS
6.2500 mg | ORAL_TABLET | Freq: Two times a day (BID) | ORAL | Status: AC
  Administered 2024-05-03: 6.25 mg via ORAL
  Filled 2024-05-03: qty 1

## 2024-05-03 NOTE — Progress Notes (Signed)
" °  Progress Note   Patient: Steven Charles FMW:[id number] DOB: August 28, 1945 DOA: 05/01/2024     2 DOS: the patient was seen and examined on 05/03/2024 at 1:50PM      Brief hospital course: 79 y.o. M with DM, L AKA, dCHF, CAD s/p CABG 2007, HTN, and hypothyroidism who was sent from clinic for failure of outpatient antibiotics for cellulitis of the right foot.        Assessment and Plan: Cellulitis - Continue antibiotics, change to linezolid  - Continue Unasyn   Diabetes Glucose controlled - Continue SSI - Continue gabapentin   History of above-knee amputation of the left  Chronic diastolic CHF Coronary artery disease Hypertension BP elevated, appears euvolemic - Continue aspirin , atorvastatin , Zetia  - Resume carvedilol , amlodipine  - Hold lisinopril , monotir BP   Hypothyroidism TSH slightly elevated, likely euthyroid sick - Continue levothyroxine  - Repeat TSH in 4 weeks with PCP  Anemia Hemoglobin stable relative to baseline  Class II obesity BMI 39.3, complicates care  Mood disorder - Continue fluoxetine , lorazepam          Subjective: Leg slightly better, still very pauinful, unable to walk or stand or pivot.  No fever, no confusion.     Physical Exam: BP (!) 173/79 (BP Location: Right Arm)   Pulse 64   Temp 99.2 F (37.3 C) (Oral)   Resp 18   Ht 5' 11 (1.803 m)   Wt 128.7 kg   SpO2 93%   BMI 39.57 kg/m   Obese adult male, lying in bed, interactive and appropriate RRR, no murmurs, no peripheral edema Respiratory normal, lungs clear without rales or wheezes Abdomen soft, no tenderness palpation or guarding, no ascites or distention Attention normal, affect normal, face symmetric, speech fluent, moves upper extremities normally Left AKA          Data Reviewed: BMP shows Cr slightly up CBC shows slightly worse anemia    Family Communication:     Disposition: Status is: Inpatient         Author: Lonni SHAUNNA Dalton,  MD 05/03/2024 5:39 PM  For on call review [email or web address]    "
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
## 2024-05-03 NOTE — Progress Notes (Signed)
 Occupational Therapy Treatment Patient Details Name: Steven Charles MRN: [id number] DOB: 07-17-1945 Today's Date: 05/03/2024   History of present illness Pt is an 79 y.o. male admitted with RLE cellulitis & infected diabetic foot wound. PMH significant for DM, L AKA, dCHF, CAD s/p CABG 2007, HTN, and hypothyroidism, diabetic peripheral polyneuropathy.   OT comments  Pt seen for OT treatment session, issued red Theraband for UE exercises to promote strengthening for functional transfer training. Pt with good return demo and was able to perform shoulder flexion, horizontal ab/adduction, forward punches and elbow flex/ext using RUE (LUE with IV running). OT will follow acutely. Patient will benefit from continued inpatient follow up therapy, <3 hours/day.       If plan is discharge home, recommend the following:  Two people to help with walking and/or transfers;Two people to help with bathing/dressing/bathroom;Assist for transportation;Help with stairs or ramp for entrance   Equipment Recommendations  Other (comment) (drop arm BSC, bari recliner)       Precautions / Restrictions Precautions Precautions: Fall Recall of Precautions/Restrictions: Intact Precaution/Restrictions Comments: 4-5 falls in past 6 months, recently slipped while transferring to toilet bc of drainage from RLE. Calls FD to help get him up off floor. Restrictions Weight Bearing Restrictions Per Provider Order: Yes RLE Weight Bearing Per Provider Order: Non weight bearing Other Position/Activity Restrictions: no formal orders, however, pt stated he was told NWB RLE given extent of wounds              Communication Communication Communication: No apparent difficulties   Cognition Arousal: Alert Behavior During Therapy: WFL for tasks assessed/performed               OT - Cognition Comments: tangetial at times, decreased insight into situation/deficits                 Following commands: Intact         Cueing   Cueing Techniques: Verbal cues, Tactile cues  Exercises Shoulder Exercises Shoulder Flexion: Strengthening, Right, AAROM, Theraband Theraband Level (Shoulder Flexion): Level 2 (Red) Shoulder ABduction: Theraband, Right, 15 reps, Strengthening Elbow Flexion: AAROM, Theraband, 15 reps, Strengthening Theraband Level (Elbow Flexion): Level 2 (Red) Elbow Extension: AAROM, Theraband, 15 reps, Right, Strengthening Theraband Level (Elbow Extension): Level 2 (Red) Other Exercises Other Exercises: red TB with RUE (LUE with running IV fluids), discussed use of band to help with tricep activation for functional transfer training. pt with good efforts and return demo; worked on reaching across midline, forwards punches            Pertinent Vitals/ Pain       Pain Assessment Pain Assessment: 0-10 Pain Score: 4  Faces Pain Scale: Hurts a little bit Pain Location: R foot Pain Descriptors / Indicators: Grimacing, Guarding Pain Intervention(s): Limited activity within patient's tolerance, Monitored during session  Home Living Family/patient expects to be discharged to:: Private residence Living Arrangements: Spouse/significant other;Children Available Help at Discharge: Family   Home Access: Ramped entrance     Home Layout: One level     Bathroom Shower/Tub: Tub/shower unit;Sponge bathes at baseline   [employer]: Handicapped height Bathroom Accessibility: Yes   Home Equipment: Wheelchair - power;Wheelchair - manual;Hospital bed   Additional Comments: Nurse, adult, spouse is bed bound per pt          Frequency  Min 2X/week        Progress Toward Goals  OT Goals(current goals can now be found in the care plan section)  Progress  towards OT goals: Progressing toward goals  Acute Rehab OT Goals OT Goal Formulation: With patient Time For Goal Achievement: 05/17/24 Potential to Achieve Goals: Good ADL Goals Pt Will Perform Upper Body Dressing: sitting;with  modified independence Pt Will Perform Lower Body Dressing: sitting/lateral leans;with modified independence;with adaptive equipment Pt Will Transfer to Toilet: with max assist;with transfer board;bedside commode Pt Will Perform Toileting - Clothing Manipulation and hygiene: with min assist;sitting/lateral leans;with adaptive equipment Pt/caregiver will Perform Home Exercise Program: Increased strength;Both right and left upper extremity;With theraband  Plan         AM-PAC OT 6 Clicks Daily Activity     Outcome Measure   Help from another person eating meals?: None Help from another person taking care of personal grooming?: None Help from another person toileting, which includes using toliet, bedpan, or urinal?: Total Help from another person bathing (including washing, rinsing, drying)?: A Lot Help from another person to put on and taking off regular upper body clothing?: A Little Help from another person to put on and taking off regular lower body clothing?: Total 6 Click Score: 15    End of Session Equipment Utilized During Treatment: Other (comment) (utilized red theraband)  OT Visit Diagnosis: History of falling (Z91.81);Muscle weakness (generalized) (M62.81);Unsteadiness on feet (R26.81);Other abnormalities of gait and mobility (R26.89);Repeated falls (R29.6);Pain Pain - Right/Left: Right Pain - part of body: Leg;Ankle and joints of foot   Activity Tolerance Patient tolerated treatment well;No increased pain   Patient Left in bed;with call bell/phone within reach;with bed alarm set   Nurse Communication Mobility status;Precautions;Need for lift equipment        Time: 8489-8477 OT Time Calculation (min): 12 min  Charges: OT General Charges $OT Visit: 1 Visit  OT Treatments $Therapeutic Activity: 8-22 mins  Steven Charles, OTR/L  05/03/24, 3:38 PM

## 2024-05-03 NOTE — TOC Progression Note (Signed)
 Transition of Care [facility]) - Progression Note    Patient Details  Name: Steven Charles MRN: [id number] Date of Birth: 30-May-1945  Transition of Care [facility] CM/SW Contact  Alfonse JONELLE Rex, RN Phone Number: 05/03/2024, 3:06 PM  Clinical Narrative:   Met with patient at bedside to review SNF bed offers ( [address] , [place], [place]), pt reports he was at [place] before and would like to return. NCM called to [facility] , admit coordinator, extended bed offer, pt accepted. Shona states will make onsite visit on Monday, CM will start insurance auth on Monday.     Expected Discharge Plan: Skilled Nursing Facility Barriers to Discharge: Continued Medical Work up               Expected Discharge Plan and Services       Living arrangements for the past 2 months: Single Family Home                                       Social Drivers of Health (SDOH) Interventions SDOH Screenings   Food Insecurity: Food Insecurity Present (05/01/2024)  Housing: High Risk (05/01/2024)  Transportation Needs: No Transportation Needs (05/01/2024)  Utilities: At Risk (05/01/2024)  Depression (PHQ2-9): Low Risk (01/18/2024)  Financial Resource Strain: High Risk (01/15/2024)  Physical Activity: Inactive (01/15/2024)  Social Connections: Unknown (05/01/2024)  Stress: Stress Concern Present (05/29/2023)  Tobacco Use: Medium Risk (05/01/2024)  Health Literacy: Adequate Health Literacy (06/05/2023)    Readmission Risk Interventions    05/02/2024    2:20 PM  Readmission Risk Prevention Plan  Post Dischage Appt Complete  Medication Screening Complete  Transportation Screening Complete

## 2024-06-05 ENCOUNTER — Ambulatory Visit

## 2024-06-19 ENCOUNTER — Encounter: Payer: Self-pay | Admitting: Physician Assistant

## 2024-07-18 ENCOUNTER — Ambulatory Visit: Admitting: Internal Medicine
